# Patient Record
Sex: Female | Born: 1980 | Race: Black or African American | Hispanic: No | Marital: Married | Smoking: Former smoker
Health system: Southern US, Community
[De-identification: ages and names within clinical notes are randomized; demographics above are authoritative.]

## PROBLEM LIST (undated history)

## (undated) DIAGNOSIS — M1990 Unspecified osteoarthritis, unspecified site: Secondary | ICD-10-CM

## (undated) DIAGNOSIS — F319 Bipolar disorder, unspecified: Secondary | ICD-10-CM

## (undated) DIAGNOSIS — Z8709 Personal history of other diseases of the respiratory system: Secondary | ICD-10-CM

## (undated) DIAGNOSIS — F32A Depression, unspecified: Secondary | ICD-10-CM

## (undated) DIAGNOSIS — I10 Essential (primary) hypertension: Secondary | ICD-10-CM

## (undated) DIAGNOSIS — R0602 Shortness of breath: Secondary | ICD-10-CM

## (undated) DIAGNOSIS — R7303 Prediabetes: Secondary | ICD-10-CM

## (undated) DIAGNOSIS — E669 Obesity, unspecified: Secondary | ICD-10-CM

## (undated) DIAGNOSIS — R531 Weakness: Secondary | ICD-10-CM

## (undated) DIAGNOSIS — M2540 Effusion, unspecified joint: Secondary | ICD-10-CM

## (undated) DIAGNOSIS — F909 Attention-deficit hyperactivity disorder, unspecified type: Secondary | ICD-10-CM

## (undated) DIAGNOSIS — L309 Dermatitis, unspecified: Secondary | ICD-10-CM

## (undated) DIAGNOSIS — Z860100 Personal history of colon polyps, unspecified: Secondary | ICD-10-CM

## (undated) DIAGNOSIS — R87619 Unspecified abnormal cytological findings in specimens from cervix uteri: Secondary | ICD-10-CM

## (undated) DIAGNOSIS — F419 Anxiety disorder, unspecified: Secondary | ICD-10-CM

## (undated) DIAGNOSIS — J45909 Unspecified asthma, uncomplicated: Secondary | ICD-10-CM

## (undated) DIAGNOSIS — K219 Gastro-esophageal reflux disease without esophagitis: Secondary | ICD-10-CM

## (undated) DIAGNOSIS — F603 Borderline personality disorder: Secondary | ICD-10-CM

## (undated) DIAGNOSIS — K648 Other hemorrhoids: Secondary | ICD-10-CM

## (undated) DIAGNOSIS — R05 Cough: Secondary | ICD-10-CM

## (undated) DIAGNOSIS — F410 Panic disorder [episodic paroxysmal anxiety] without agoraphobia: Secondary | ICD-10-CM

## (undated) DIAGNOSIS — R059 Cough, unspecified: Secondary | ICD-10-CM

## (undated) DIAGNOSIS — K589 Irritable bowel syndrome without diarrhea: Secondary | ICD-10-CM

## (undated) DIAGNOSIS — M5430 Sciatica, unspecified side: Secondary | ICD-10-CM

## (undated) DIAGNOSIS — N926 Irregular menstruation, unspecified: Secondary | ICD-10-CM

## (undated) DIAGNOSIS — F329 Major depressive disorder, single episode, unspecified: Secondary | ICD-10-CM

## (undated) DIAGNOSIS — F99 Mental disorder, not otherwise specified: Secondary | ICD-10-CM

## (undated) DIAGNOSIS — B009 Herpesviral infection, unspecified: Secondary | ICD-10-CM

## (undated) DIAGNOSIS — G4700 Insomnia, unspecified: Secondary | ICD-10-CM

## (undated) DIAGNOSIS — Z8619 Personal history of other infectious and parasitic diseases: Secondary | ICD-10-CM

## (undated) DIAGNOSIS — Z309 Encounter for contraceptive management, unspecified: Secondary | ICD-10-CM

## (undated) DIAGNOSIS — T7840XA Allergy, unspecified, initial encounter: Secondary | ICD-10-CM

## (undated) DIAGNOSIS — G43909 Migraine, unspecified, not intractable, without status migrainosus: Secondary | ICD-10-CM

## (undated) DIAGNOSIS — R3915 Urgency of urination: Secondary | ICD-10-CM

## (undated) DIAGNOSIS — K5900 Constipation, unspecified: Secondary | ICD-10-CM

## (undated) DIAGNOSIS — IMO0002 Reserved for concepts with insufficient information to code with codable children: Secondary | ICD-10-CM

## (undated) DIAGNOSIS — R51 Headache: Secondary | ICD-10-CM

## (undated) DIAGNOSIS — M549 Dorsalgia, unspecified: Secondary | ICD-10-CM

## (undated) DIAGNOSIS — M62830 Muscle spasm of back: Secondary | ICD-10-CM

## (undated) DIAGNOSIS — G8929 Other chronic pain: Secondary | ICD-10-CM

## (undated) HISTORY — DX: Prediabetes: R73.03

## (undated) HISTORY — PX: COLONOSCOPY: SHX174

## (undated) HISTORY — DX: Irritable bowel syndrome, unspecified: K58.9

## (undated) HISTORY — DX: Unspecified abnormal cytological findings in specimens from cervix uteri: R87.619

## (undated) HISTORY — DX: Irregular menstruation, unspecified: N92.6

## (undated) HISTORY — DX: Obesity, unspecified: E66.9

## (undated) HISTORY — DX: Encounter for contraceptive management, unspecified: Z30.9

## (undated) HISTORY — PX: BACK SURGERY: SHX140

## (undated) HISTORY — DX: Allergy, unspecified, initial encounter: T78.40XA

## (undated) HISTORY — DX: Sciatica, unspecified side: M54.30

## (undated) HISTORY — DX: Personal history of other infectious and parasitic diseases: Z86.19

## (undated) HISTORY — DX: Headache: R51

## (undated) HISTORY — PX: CHOLECYSTECTOMY: SHX55

## (undated) HISTORY — DX: Herpesviral infection, unspecified: B00.9

## (undated) HISTORY — DX: Reserved for concepts with insufficient information to code with codable children: IMO0002

## (undated) HISTORY — DX: Panic disorder (episodic paroxysmal anxiety): F41.0

## (undated) HISTORY — PX: OTHER SURGICAL HISTORY: SHX169

## (undated) HISTORY — PX: BUNIONECTOMY: SHX129

## (undated) HISTORY — PX: WISDOM TOOTH EXTRACTION: SHX21

## (undated) HISTORY — PX: TONSILLECTOMY: SUR1361

---
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
## 2001-05-04 ENCOUNTER — Ambulatory Visit (HOSPITAL_COMMUNITY): Admission: RE | Admit: 2001-05-04 | Discharge: 2001-05-04 | Payer: Self-pay | Admitting: *Deleted

## 2001-05-04 ENCOUNTER — Encounter: Payer: Self-pay | Admitting: Family Medicine

## 2001-07-17 ENCOUNTER — Other Ambulatory Visit: Admission: RE | Admit: 2001-07-17 | Discharge: 2001-07-17 | Payer: Self-pay | Admitting: Obstetrics and Gynecology

## 2001-10-05 ENCOUNTER — Ambulatory Visit (HOSPITAL_COMMUNITY): Admission: RE | Admit: 2001-10-05 | Discharge: 2001-10-05 | Payer: Self-pay | Admitting: Gastroenterology

## 2001-10-05 ENCOUNTER — Encounter: Payer: Self-pay | Admitting: Gastroenterology

## 2001-10-12 ENCOUNTER — Ambulatory Visit (HOSPITAL_COMMUNITY): Admission: RE | Admit: 2001-10-12 | Discharge: 2001-10-12 | Payer: Self-pay | Admitting: Gastroenterology

## 2001-10-12 ENCOUNTER — Encounter: Payer: Self-pay | Admitting: Gastroenterology

## 2001-10-19 ENCOUNTER — Encounter (INDEPENDENT_AMBULATORY_CARE_PROVIDER_SITE_OTHER): Payer: Self-pay | Admitting: Specialist

## 2001-10-19 ENCOUNTER — Ambulatory Visit (HOSPITAL_COMMUNITY): Admission: RE | Admit: 2001-10-19 | Discharge: 2001-10-20 | Payer: Self-pay | Admitting: Surgery

## 2001-12-22 ENCOUNTER — Emergency Department (HOSPITAL_COMMUNITY): Admission: EM | Admit: 2001-12-22 | Discharge: 2001-12-22 | Payer: Self-pay | Admitting: Emergency Medicine

## 2002-05-21 ENCOUNTER — Emergency Department (HOSPITAL_COMMUNITY): Admission: EM | Admit: 2002-05-21 | Discharge: 2002-05-21 | Payer: Self-pay | Admitting: *Deleted

## 2005-06-28 ENCOUNTER — Emergency Department (HOSPITAL_COMMUNITY): Admission: EM | Admit: 2005-06-28 | Discharge: 2005-06-28 | Payer: Self-pay | Admitting: Emergency Medicine

## 2005-08-24 ENCOUNTER — Emergency Department (HOSPITAL_COMMUNITY): Admission: EM | Admit: 2005-08-24 | Discharge: 2005-08-24 | Payer: Self-pay | Admitting: Emergency Medicine

## 2005-09-01 ENCOUNTER — Encounter (HOSPITAL_COMMUNITY)
Admission: RE | Admit: 2005-09-01 | Discharge: 2005-10-01 | Payer: Self-pay | Admitting: Rehabilitative and Restorative Service Providers"

## 2005-09-02 ENCOUNTER — Ambulatory Visit (HOSPITAL_COMMUNITY): Admission: RE | Admit: 2005-09-02 | Discharge: 2005-09-02 | Payer: Self-pay | Admitting: Family Medicine

## 2006-06-22 ENCOUNTER — Emergency Department: Payer: Self-pay | Admitting: Emergency Medicine

## 2006-06-23 ENCOUNTER — Inpatient Hospital Stay: Payer: Self-pay | Admitting: Unknown Physician Specialty

## 2007-01-12 ENCOUNTER — Emergency Department (HOSPITAL_COMMUNITY): Admission: EM | Admit: 2007-01-12 | Discharge: 2007-01-12 | Payer: Self-pay | Admitting: Emergency Medicine

## 2007-01-21 ENCOUNTER — Emergency Department (HOSPITAL_COMMUNITY): Admission: EM | Admit: 2007-01-21 | Discharge: 2007-01-21 | Payer: Self-pay | Admitting: Emergency Medicine

## 2007-01-26 ENCOUNTER — Emergency Department (HOSPITAL_COMMUNITY): Admission: EM | Admit: 2007-01-26 | Discharge: 2007-01-27 | Payer: Self-pay | Admitting: Emergency Medicine

## 2007-05-14 ENCOUNTER — Other Ambulatory Visit: Admission: RE | Admit: 2007-05-14 | Discharge: 2007-05-14 | Payer: Self-pay | Admitting: Obstetrics and Gynecology

## 2007-05-16 ENCOUNTER — Ambulatory Visit (HOSPITAL_COMMUNITY): Admission: RE | Admit: 2007-05-16 | Discharge: 2007-05-16 | Payer: Self-pay | Admitting: Obstetrics and Gynecology

## 2008-06-24 ENCOUNTER — Other Ambulatory Visit: Admission: RE | Admit: 2008-06-24 | Discharge: 2008-06-24 | Payer: Self-pay | Admitting: Obstetrics & Gynecology

## 2008-08-20 ENCOUNTER — Emergency Department (HOSPITAL_COMMUNITY): Admission: EM | Admit: 2008-08-20 | Discharge: 2008-08-20 | Payer: Self-pay | Admitting: Emergency Medicine

## 2009-05-06 ENCOUNTER — Emergency Department: Payer: Self-pay | Admitting: Emergency Medicine

## 2009-06-25 ENCOUNTER — Other Ambulatory Visit: Admission: RE | Admit: 2009-06-25 | Discharge: 2009-06-25 | Payer: Self-pay | Admitting: Obstetrics and Gynecology

## 2010-01-14 ENCOUNTER — Emergency Department (HOSPITAL_COMMUNITY): Admission: EM | Admit: 2010-01-14 | Discharge: 2010-01-14 | Payer: Self-pay | Admitting: Emergency Medicine

## 2010-01-21 ENCOUNTER — Emergency Department (HOSPITAL_COMMUNITY): Admission: EM | Admit: 2010-01-21 | Discharge: 2010-01-21 | Payer: Self-pay | Admitting: Family Medicine

## 2010-06-29 ENCOUNTER — Other Ambulatory Visit
Admission: RE | Admit: 2010-06-29 | Discharge: 2010-06-29 | Payer: Self-pay | Source: Home / Self Care | Admitting: Obstetrics & Gynecology

## 2010-06-29 ENCOUNTER — Other Ambulatory Visit: Payer: Self-pay | Admitting: Obstetrics & Gynecology

## 2010-07-09 ENCOUNTER — Encounter (HOSPITAL_COMMUNITY): Payer: Self-pay

## 2010-07-09 ENCOUNTER — Emergency Department (HOSPITAL_COMMUNITY)
Admission: EM | Admit: 2010-07-09 | Discharge: 2010-07-09 | Disposition: A | Discharge status: Home / Self Care | Payer: Self-pay | Attending: Emergency Medicine | Admitting: Emergency Medicine

## 2010-07-09 ENCOUNTER — Emergency Department (HOSPITAL_COMMUNITY): Admit: 2010-07-09 | Discharge: 2010-07-09 | Disposition: A | Discharge status: Home / Self Care | Payer: Self-pay

## 2010-07-09 DIAGNOSIS — F3130 Bipolar disorder, current episode depressed, mild or moderate severity, unspecified: Secondary | ICD-10-CM | POA: Insufficient documentation

## 2010-07-09 DIAGNOSIS — H538 Other visual disturbances: Secondary | ICD-10-CM | POA: Insufficient documentation

## 2010-07-09 DIAGNOSIS — R112 Nausea with vomiting, unspecified: Secondary | ICD-10-CM | POA: Insufficient documentation

## 2010-07-09 DIAGNOSIS — Z79899 Other long term (current) drug therapy: Secondary | ICD-10-CM | POA: Insufficient documentation

## 2010-07-09 DIAGNOSIS — R42 Dizziness and giddiness: Secondary | ICD-10-CM | POA: Insufficient documentation

## 2010-07-09 DIAGNOSIS — R630 Anorexia: Secondary | ICD-10-CM | POA: Insufficient documentation

## 2010-07-09 DIAGNOSIS — R51 Headache: Secondary | ICD-10-CM | POA: Insufficient documentation

## 2010-08-20 LAB — BASIC METABOLIC PANEL
Calcium: 9.1 mg/dL (ref 8.4–10.5)
Chloride: 104 mEq/L (ref 96–112)
GFR calc Af Amer: >60 mL/min (ref >60)
Potassium: 3.7 mEq/L (ref 3.5–5.1)
Sodium: 136 mEq/L (ref 135–145)

## 2010-08-20 LAB — DIFFERENTIAL
Eosinophils Absolute: 0.2 10*3/uL (ref 0.0–0.7)
Eosinophils Relative: 2 % (ref 0–5)
Monocytes Relative: 6 % (ref 3–12)
Neutro Abs: 6.6 10*3/uL (ref 1.7–7.7)
Neutrophils Relative %: 66 % (ref 43–77)

## 2010-08-20 LAB — CBC
HCT: 42 % (ref 36.0–46.0)
MCH: 32.2 pg (ref 26.0–34.0)
MCV: 92.9 fL (ref 78.0–100.0)
Platelets: 308 10*3/uL (ref 150–400)

## 2010-08-20 LAB — URINALYSIS, ROUTINE W REFLEX MICROSCOPIC
Ketones, ur: NEGATIVE mg/dL
Protein, ur: NEGATIVE mg/dL
pH: 6 (ref 5.0–8.0)

## 2010-09-16 LAB — POCT CARDIAC MARKERS
Myoglobin, poc: 37.5 ng/mL (ref 12–200)
Troponin i, poc: <0.05 ng/mL (ref 0.00–0.09)

## 2010-10-09 ENCOUNTER — Emergency Department (HOSPITAL_COMMUNITY)
Admission: EM | Admit: 2010-10-09 | Discharge: 2010-10-09 | Disposition: A | Discharge status: Home / Self Care | Payer: Medicaid Other | Attending: Emergency Medicine | Admitting: Emergency Medicine

## 2010-10-09 ENCOUNTER — Emergency Department (HOSPITAL_COMMUNITY): Payer: Medicaid Other

## 2010-10-09 DIAGNOSIS — F411 Generalized anxiety disorder: Secondary | ICD-10-CM | POA: Insufficient documentation

## 2010-10-09 DIAGNOSIS — J45909 Unspecified asthma, uncomplicated: Secondary | ICD-10-CM | POA: Insufficient documentation

## 2010-10-09 DIAGNOSIS — F319 Bipolar disorder, unspecified: Secondary | ICD-10-CM | POA: Insufficient documentation

## 2010-10-09 DIAGNOSIS — G43909 Migraine, unspecified, not intractable, without status migrainosus: Secondary | ICD-10-CM | POA: Insufficient documentation

## 2010-10-09 DIAGNOSIS — F988 Other specified behavioral and emotional disorders with onset usually occurring in childhood and adolescence: Secondary | ICD-10-CM | POA: Insufficient documentation

## 2010-10-09 DIAGNOSIS — Z79899 Other long term (current) drug therapy: Secondary | ICD-10-CM | POA: Insufficient documentation

## 2010-10-09 LAB — URINALYSIS, ROUTINE W REFLEX MICROSCOPIC
Bilirubin Urine: NEGATIVE
Glucose, UA: NEGATIVE mg/dL

## 2010-10-09 LAB — BASIC METABOLIC PANEL
BUN: 6 mg/dL (ref 6–23)
CO2: 24 mEq/L (ref 19–32)
Chloride: 101 mEq/L (ref 96–112)
Creatinine, Ser: 0.84 mg/dL (ref 0.4–1.2)
GFR calc Af Amer: >60 mL/min (ref >60)
Glucose, Bld: 85 mg/dL (ref 70–99)
Potassium: 3.4 mEq/L — ABNORMAL LOW (ref 3.5–5.1)

## 2010-10-22 NOTE — Op Note (Signed)
[facility]  Patient:    Sharon Vazquez, Sharon Vazquez Visit Number: [id number] MRN: [id number]          Service Type: DSU Location: [id number] Attending Physician:  Carman Ching Md Dictated by:   Abigail Miyamoto, M.D. Proc. Date: 10/19/01 Admit Date:  10/19/2001 Discharge Date: 10/20/2001                 Operative Report  PREOPERATIVEDIAGNOSIS:  Acute cholecystitis.  POSTOPERATIVE DIAGNOSIS:  Acute cholecystitis.  OPERATION PERFORMED:  Laparoscopic cholecystectomy.  SURGEON:  Abigail Miyamoto, M.D.  ANESTHESIA:  General endotracheal anesthesia, 0.25% Marcaine.  ESTIMATED BLOOD LOSS:  Minimal.  DESCRIPTION OF PROCEDURE:  The patient was brought to the operating room and identified as Sharon Vazquez. She was placed supine on the operatingroom table and general endotracheal anesthesia was induced. Her abdomen was then prepped and draped in the usual sterile fashion. Using a #15 blade, a small transverse incision was made below the umbilicus. The incision was carried down through the fascia which was then opened with the scalpel. A hemostat was then used to pass through the peritoneal cavity. A #0Vicryl pursestring suture then placed around the fascial opening. The Hasson port was placed through the opening and insufflation was begun. An 11 mm port was then placed in the patients epigastrium and two 5 mm ports were placed in the patients right flank area under direct vision. The gallbladder was then grasped and retracted above the liver bed. Dissection was then carried out to the base of the gallbladder. The cystic duct was dissected out and clipped three times proximally and once distally and transected with scissors. The cystic artery was then identified, clipped twice proximally and once distally and transected as well. The gallbladder was then slowly dissected free from the liver bed with electrocautery. Once the gallbladder was  excised from the liver bed the bed was examined andhemostasis was achieved. The gallbladder was then removed through the incision at the umbilicus. The #0 Vicryl at the umbilicus was tied in placeclosing the fascial defect. All ports were then removed under direct vision and the abdomen was deflated. All incisions were then anesthetized with 0.25% Marcaine and then closed with 4-0 Vicryl subcuticular sutures. Steri-Strips, gauze and tape were then applied. The patient tolerated the procedure well. All sponge, needle, lap and instrument counts were correct at the end of the procedure. The patient was then extubated in the operatingroom and taken in stable condition to the recovery room. Dictated by:   Abigail Miyamoto, M.D. Attending Physician:  Carman Ching Md DD:  10/19/01 TD:  10/22/01 Job: [id number]

## 2011-03-18 LAB — COMPREHENSIVE METABOLIC PANEL
ALT: 13
AST: 16
Alkaline Phosphatase: 52
Creatinine, Ser: 0.76
GFR calc Af Amer: >60
GFR calc non Af Amer: >60
Sodium: 137

## 2011-03-18 LAB — LIPASE, BLOOD: Lipase: 20

## 2011-03-18 LAB — DIFFERENTIAL
Basophils Relative: 0
Eosinophils Absolute: 0.4
Lymphs Abs: 3.9 — ABNORMAL HIGH
Monocytes Relative: 6
Neutrophils Relative %: 57

## 2011-03-18 LAB — POCT URINALYSIS DIP (DEVICE)
Bilirubin Urine: NEGATIVE
Glucose, UA: NEGATIVE
Hgb urine dipstick: NEGATIVE
Nitrite: NEGATIVE
Operator id: 116391
Urobilinogen, UA: 0.2

## 2011-03-18 LAB — CBC
Hemoglobin: 12.7
MCV: 93.8
RBC: 3.93
RDW: 13.6
WBC: 11.8 — ABNORMAL HIGH

## 2011-03-18 LAB — URINALYSIS, ROUTINE W REFLEX MICROSCOPIC
Bilirubin Urine: NEGATIVE
Glucose, UA: NEGATIVE
Hgb urine dipstick: NEGATIVE
Specific Gravity, Urine: 1.025

## 2011-03-18 LAB — PREGNANCY, URINE: Preg Test, Ur: NEGATIVE

## 2011-03-21 LAB — URINALYSIS, ROUTINE W REFLEX MICROSCOPIC
Bilirubin Urine: NEGATIVE
Hgb urine dipstick: NEGATIVE
Ketones, ur: NEGATIVE
Nitrite: NEGATIVE
pH: 6

## 2011-03-21 LAB — PREGNANCY, URINE: Preg Test, Ur: NEGATIVE

## 2011-07-01 ENCOUNTER — Other Ambulatory Visit (HOSPITAL_COMMUNITY)
Admission: RE | Admit: 2011-07-01 | Discharge: 2011-07-01 | Disposition: A | Discharge status: Home / Self Care | Payer: Self-pay | Source: Ambulatory Visit | Attending: Obstetrics and Gynecology | Admitting: Obstetrics and Gynecology

## 2011-07-01 ENCOUNTER — Other Ambulatory Visit: Payer: Self-pay | Admitting: Adult Health

## 2011-07-01 DIAGNOSIS — Z113 Encounter for screening for infections with a predominantly sexual mode of transmission: Secondary | ICD-10-CM | POA: Insufficient documentation

## 2011-07-01 DIAGNOSIS — Z01419 Encounter for gynecological examination (general) (routine) without abnormal findings: Secondary | ICD-10-CM | POA: Insufficient documentation

## 2011-08-01 ENCOUNTER — Emergency Department (HOSPITAL_COMMUNITY)
Admission: EM | Admit: 2011-08-01 | Discharge: 2011-08-01 | Disposition: A | Discharge status: Home / Self Care | Payer: Self-pay

## 2011-08-01 ENCOUNTER — Encounter (HOSPITAL_COMMUNITY): Payer: Self-pay | Admitting: *Deleted

## 2011-08-01 HISTORY — DX: Bipolar disorder, unspecified: F31.9

## 2011-08-01 HISTORY — DX: Essential (primary) hypertension: I10

## 2011-08-01 HISTORY — DX: Borderline personality disorder: F60.3

## 2011-08-01 HISTORY — DX: Attention-deficit hyperactivity disorder, unspecified type: F90.9

## 2011-08-01 HISTORY — DX: Anxiety disorder, unspecified: F41.9

## 2011-08-01 NOTE — ED Notes (Signed)
Took Xanax at work at 1700 due to anxiety and now has dizziness and light headedness, denies N/v

## 2011-08-01 NOTE — ED Notes (Signed)
Pt slurring words and sedated while in triage, family member states that is not her usual behavior including after taking Prudy Feeler

## 2012-01-17 ENCOUNTER — Emergency Department (HOSPITAL_COMMUNITY)
Admission: EM | Admit: 2012-01-17 | Discharge: 2012-01-17 | Disposition: A | Discharge status: Home / Self Care | Payer: Self-pay | Attending: Emergency Medicine | Admitting: Emergency Medicine

## 2012-01-17 ENCOUNTER — Encounter (HOSPITAL_COMMUNITY): Payer: Self-pay

## 2012-01-17 DIAGNOSIS — F17200 Nicotine dependence, unspecified, uncomplicated: Secondary | ICD-10-CM | POA: Insufficient documentation

## 2012-01-17 DIAGNOSIS — F319 Bipolar disorder, unspecified: Secondary | ICD-10-CM | POA: Insufficient documentation

## 2012-01-17 DIAGNOSIS — R51 Headache: Secondary | ICD-10-CM | POA: Insufficient documentation

## 2012-01-17 DIAGNOSIS — Z79899 Other long term (current) drug therapy: Secondary | ICD-10-CM | POA: Insufficient documentation

## 2012-01-17 DIAGNOSIS — I10 Essential (primary) hypertension: Secondary | ICD-10-CM | POA: Insufficient documentation

## 2012-01-17 DIAGNOSIS — Z9089 Acquired absence of other organs: Secondary | ICD-10-CM | POA: Insufficient documentation

## 2012-01-17 DIAGNOSIS — F909 Attention-deficit hyperactivity disorder, unspecified type: Secondary | ICD-10-CM | POA: Insufficient documentation

## 2012-01-17 DIAGNOSIS — R45850 Homicidal ideations: Secondary | ICD-10-CM | POA: Insufficient documentation

## 2012-01-17 LAB — RAPID URINE DRUG SCREEN, HOSP PERFORMED
Barbiturates: NOT DETECTED
Cocaine: NOT DETECTED
Opiates: NOT DETECTED

## 2012-01-17 LAB — CBC WITH DIFFERENTIAL/PLATELET
Eosinophils Relative: 3 % (ref 0–5)
HCT: 39.3 % (ref 36.0–46.0)
Hemoglobin: 14.3 g/dL (ref 12.0–15.0)
Lymphocytes Relative: 33 % (ref 12–46)
MCV: 87.9 fL (ref 78.0–100.0)
Monocytes Absolute: 0.5 10*3/uL (ref 0.1–1.0)
Monocytes Relative: 4 % (ref 3–12)
Neutro Abs: 6.7 10*3/uL (ref 1.7–7.7)
WBC: 11.3 10*3/uL — ABNORMAL HIGH (ref 4.0–10.5)

## 2012-01-17 LAB — COMPREHENSIVE METABOLIC PANEL
BUN: 9 mg/dL (ref 6–23)
CO2: 25 mEq/L (ref 19–32)
Calcium: 9.7 mg/dL (ref 8.4–10.5)
Chloride: 102 mEq/L (ref 96–112)
Creatinine, Ser: 1 mg/dL (ref 0.50–1.10)
GFR calc Af Amer: 86 mL/min — ABNORMAL LOW (ref >90)
GFR calc non Af Amer: 74 mL/min — ABNORMAL LOW (ref >90)
Glucose, Bld: 79 mg/dL (ref 70–99)
Total Bilirubin: 0.3 mg/dL (ref 0.3–1.2)

## 2012-01-17 MED ORDER — PROMETHAZINE HCL 25 MG/ML IJ SOLN
25.0000 mg | Freq: Once | INTRAMUSCULAR | Status: AC
  Administered 2012-01-17: 25 mg via INTRAMUSCULAR
  Filled 2012-01-17: qty 1

## 2012-01-17 MED ORDER — KETOROLAC TROMETHAMINE 60 MG/2ML IM SOLN
60.0000 mg | Freq: Once | INTRAMUSCULAR | Status: AC
  Administered 2012-01-17: 60 mg via INTRAMUSCULAR
  Filled 2012-01-17: qty 2

## 2012-01-17 NOTE — BH Assessment (Signed)
Assessment Note   Sharon Vazquez is an 31 y.o. female. PT REPORTED TO THE ED COMPLAINING OF MIGRAINE HEADACHES AS RELATED TO NOT HAVING HER BIPOLAR MEDS, AND MOOD SWINGS. PT REPORTED SHE HURT SO BAD THAT SHE FELT LIKE SHE COULD KILL SOMEONE SUCH AS CO-WORKERS. PT REPORTS SHE IS NOT SUICIDAL AND WAS TRYING TO EXPRESS HOW HER PAIN WAS AND HOW MUCH SHE NEEDED HER ABILIFY.  PT REPORTS SHE HAD TRIED UNSUCCESSFUL TO REACH SOMEONE AT HER [facility] IN [place].  SHE IS NOT SUICIDAL, HOMICIDAL, NOR PSYCHOTIC.  PT CONTRACTS FOR SAFETY TO SELF AND OTHERS.      Axis I: Bipolar, Depressed Axis II: Deferred Axis III:  Past Medical History  Diagnosis Date  . Hypertension   . Anxiety   . Bipolar 1 disorder   . ADHD (attention deficit hyperactivity disorder)   . Borderline personality disorder    Axis IV: problems with access to health care services Axis V: 51-60 moderate symptoms           Past Medical History:  Past Medical History  Diagnosis Date  . Hypertension   . Anxiety   . Bipolar 1 disorder   . ADHD (attention deficit hyperactivity disorder)   . Borderline personality disorder     Past Surgical History  Procedure Date  . Cholecystectomy   . Wisdom tooth extraction   . Bunion removal     Family History: No family history on file.  Social History:  reports that she has been smoking Cigarettes.  She has been smoking about .5 packs per day. She does not have any smokeless tobacco history on file. She reports that she drinks alcohol. She reports that she does not use illicit drugs.  Additional Social History:  Alcohol / Drug Use Pain Medications: na Prescriptions: na Over the Counter: na History of alcohol / drug use?: Yes Substance #1 Name of Substance 1: BEER 1 - Age of First Use: 17 1 - Amount (size/oz): 2-3 BEERS 1 - Frequency: WEEKLY 1 - Duration: 3 YEARS 1 - Last Use / Amount: 81213   2 BEERS  12OZ  CIWA: CIWA-Ar BP: 130/84 mmHg Pulse Rate: 102    COWS:    Allergies:  Allergies  Allergen Reactions  . Bee Venom Anaphylaxis  . Dilaudid (Hydromorphone Hcl) Anaphylaxis and Hives  . Peanut-Containing Drug Products Anaphylaxis  . Senna Anaphylaxis and Hives  . Adhesive (Tape) Rash    Home Medications:  (Not in a hospital admission)  OB/GYN Status:  No LMP recorded. Patient has had an injection.  General Assessment Data Location of Assessment: [facility] ACT Assessment: Yes Living Arrangements: Spouse/significant other Can pt return to current living arrangement?: Yes Admission Status: Voluntary Is patient capable of signing voluntary admission?: Yes Transfer from: Acute Hospital Referral Source: MD (dr Georges Mouse)  Education Status Contact person: GEOFFREY KNIGHT-SPOUSE-[phone]  Risk to self Suicidal Ideation: No Suicidal Intent: No Is patient at risk for suicide?: No Suicidal Plan?: No Access to Means: No What has been your use of drugs/alcohol within the last 12 months?: BEER Previous Attempts/Gestures: Yes How many times?: 1  Other Self Harm Risks: NO Triggers for Past Attempts: Other (Comment) (DEATH OF GRANDMOTHER) Intentional Self Injurious Behavior: None Family Suicide History: No Recent stressful life event(s): Other (Comment) (OUT OF PSYCH MEDS) Persecutory voices/beliefs?: No Depression: Yes Depression Symptoms: Despondent;Tearfulness;Loss of interest in usual pleasures;Feeling worthless/self pity;Feeling angry/irritable;Isolating Substance abuse history and/or treatment for substance abuse?: No Suicide prevention information given to non-admitted patients:  Not applicable  Risk to Others Homicidal Ideation: No Thoughts of Harm to Others: No Current Homicidal Intent: No-Not Currently/Within Last 6 Months Current Homicidal Plan: No Access to Homicidal Means: No History of harm to others?: No Assessment of Violence: None Noted Violent Behavior Description: NONE Does patient have access to weapons?:  No Criminal Charges Pending?: No Does patient have a court date: No  Psychosis Hallucinations: None noted Delusions: None noted  Mental Status Report Appear/Hygiene: Improved Eye Contact: Good Motor Activity: Freedom of movement Speech: Logical/coherent Level of Consciousness: Alert;Crying Mood: Depressed Affect: Appropriate to circumstance Anxiety Level: Minimal Thought Processes: Coherent;Relevant Judgement: Unimpaired Orientation: Person;Place;Time;Situation Obsessive Compulsive Thoughts/Behaviors: None  Cognitive Functioning Concentration: Normal Memory: Recent Intact;Remote Intact IQ: Average Insight: Fair Impulse Control: Good Appetite: Good Sleep: No Change Total Hours of Sleep: 8  Vegetative Symptoms: None  ADLScreening [facility] Assessment Services) Patient's cognitive ability adequate to safely complete daily activities?: Yes Patient able to express need for assistance with ADLs?: Yes Independently performs ADLs?: Yes (appropriate for developmental age)  Abuse/Neglect [facility]) Physical Abuse: Denies Verbal Abuse: Denies Sexual Abuse: Denies  Prior Inpatient Therapy Prior Inpatient Therapy: Yes Prior Therapy Dates: 2010 Prior Therapy Facilty/Provider(s): [facility] Reason for Treatment: GRIEF LOSS OF GRANDMOTHER, DEPRESSED  Prior Outpatient Therapy Prior Outpatient Therapy: Yes Prior Therapy Dates: PAST 3 YEARS Prior Therapy Facilty/Provider(s): [facility] [place] Reason for Treatment: BIPOLAR  ADL Screening (condition at time of admission) Patient's cognitive ability adequate to safely complete daily activities?: Yes Patient able to express need for assistance with ADLs?: Yes Independently performs ADLs?: Yes (appropriate for developmental age) Weakness of Legs: None Weakness of Arms/Hands: None  Home Assistive Devices/Equipment Home Assistive Devices/Equipment: None  Therapy Consults (therapy consults require a physician order) PT  Evaluation Needed: No OT Evalulation Needed: No SLP Evaluation Needed: No Abuse/Neglect Assessment (Assessment to be complete while patient is alone) Physical Abuse: Denies Verbal Abuse: Denies Sexual Abuse: Denies Exploitation of patient/patient's resources: Denies Self-Neglect: Denies Values / Beliefs Cultural Requests During Hospitalization: None Spiritual Requests During Hospitalization: None Consults Spiritual Care Consult Needed: No Social Work Consult Needed: No Merchant navy officer (For Healthcare) Advance Directive: Patient does not have advance directive;Patient has advance directive, copy not in chart Pre-existing out of facility DNR order (yellow form or pink MOST form): No Nutrition Screen Diet: Regular (pt has had dinner)  Additional Information 1:1 In Past 12 Months?: No CIRT Risk: No Elopement Risk: No Does patient have medical clearance?: Yes     Disposition: CURRENT PROVIDER-[facility] Disposition Disposition of Patient: Other dispositions Other disposition(s): To current provider ([facility])  On Site Evaluation by:   Reviewed with Physician: DR Alphia Kava, Samson Frederic Winford 01/17/2012 7:30 PM

## 2012-01-17 NOTE — ED Provider Notes (Signed)
History   This chart was scribed for Geoffery Lyons, MD by Charolett Bumpers . The patient was seen in room [facility]. Patient's care was started at 1400.    CSN: [id number]  Arrival date & time 01/17/12  1306   First MD Initiated Contact with Patient 01/17/12 1400      Chief Complaint  Patient presents with  . V70.1  . Headache    (Consider location/radiation/quality/duration/timing/severity/associated sxs/prior treatment) HPI Comments: Pt reports she has had a migraines intermittently over the past couple of weeks. Pt states that the current headache started around 4 am with associated nausea. Pt reports a h/o bipolar disorder and states that she has been off of Abilify and Doxepin for over the past week due to financial issues and states that she has been having mood swings and HI. Pt denies any SI. Pt reports occasional alcohol use. Pt reports a prior hospitalization for bipolar 2-3 years ago. Pt reports she took 4 ibuprofen, 2 mg xanax and Fioricet without relief.   Patient is a 31 y.o. female presenting with headaches. The history is provided by the patient.  Headache  This is a new problem. The current episode started 12 to 24 hours ago. The problem occurs constantly. The problem has not changed since onset.The pain is moderate. The pain does not radiate. Associated symptoms include nausea. Pertinent negatives include no fever and no vomiting. She has tried NSAIDs (Fioricet ) for the symptoms. The treatment provided no relief.    Past Medical History  Diagnosis Date  . Hypertension   . Anxiety   . Bipolar 1 disorder   . ADHD (attention deficit hyperactivity disorder)   . Borderline personality disorder     Past Surgical History  Procedure Date  . Cholecystectomy   . Wisdom tooth extraction   . Bunion removal     No family history on file.  History  Substance Use Topics  . Smoking status: Current Everyday Smoker -- 0.5 packs/day    Types: Cigarettes  .  Smokeless tobacco: Not on file  . Alcohol Use: Yes     " a beer every couple of days"    OB History    Grav Para Term Preterm Abortions TAB SAB Ect Mult Living                  Review of Systems  Constitutional: Negative for fever.  Gastrointestinal: Positive for nausea. Negative for vomiting.  Neurological: Positive for headaches.  Psychiatric/Behavioral: Negative for suicidal ideas.       Homicidal ideations.  All other systems reviewed and are negative.    Allergies  Bee venom; Dilaudid; Other; and Senna  Home Medications   Current Outpatient Rx  Name Route Sig Dispense Refill  . ALPRAZOLAM 2 MG PO TABS Oral Take 2 mg by mouth daily as needed. FOR ANXIETY    . ARIPIPRAZOLE 5 MG PO TABS Oral Take 5 mg by mouth daily.    Marland Kitchen CLONIDINE HCL 0.1 MG PO TABS Oral Take 0.1 mg by mouth 2 (two) times daily.    Marland Kitchen DOXEPIN HCL 25 MG PO CAPS Oral Take 25 mg by mouth 2 (two) times daily.    Marland Kitchen LURASIDONE HCL 80 MG PO TABS Oral Take 80 mg by mouth at bedtime.    Marland Kitchen MEDROXYPROGESTERONE ACETATE 150 MG/ML IM SUSP Intramuscular Inject 150 mg into the muscle every 3 (three) months.      BP 130/84  Pulse 102  Temp 98.6 F (37 C) (  Oral)  Resp 16  Ht 5\' 6"  (1.676 m)  Wt 230 lb (104.327 kg)  BMI 37.12 kg/m2  SpO2 99%  Physical Exam  Nursing note and vitals reviewed. Constitutional: She is oriented to person, place, and time. She appears well-developed and well-nourished. No distress.  HENT:  Head: Normocephalic and atraumatic.  Eyes: EOM are normal. Pupils are equal, round, and reactive to light.       No papilledema  Neck: Normal range of motion. Neck supple. No tracheal deviation present.  Cardiovascular: Normal rate, regular rhythm and normal heart sounds.   Pulmonary/Chest: Effort normal and breath sounds normal. No respiratory distress. She has no wheezes.  Abdominal: Soft. Bowel sounds are normal. She exhibits no distension. There is no tenderness.  Musculoskeletal: Normal range  of motion. She exhibits no edema.  Neurological: She is alert and oriented to person, place, and time. No sensory deficit.  Skin: Skin is warm and dry.  Psychiatric: She has a normal mood and affect. Her behavior is normal.    ED Course  Procedures (including critical care time)  DIAGNOSTIC STUDIES: Oxygen Saturation is 99% on room air, normal by my interpretation.    COORDINATION OF CARE:  14:14-Discussed planned course of treatment with the patient, who is agreeable at this time.   14:30-Medication Orders: Promethazine (Phenergan) injection 25 mg-once; Ketorolac (Toradol) injection 60 mg-once.   Labs Reviewed - No data to display No results found.   No diagnosis found.    MDM  Patient seen by act, felt not to be suicidal or homicidal.  She is to follow up tomorrow with counselor (see Act note), return prn   I personally performed the services described in this documentation, which was scribed in my presence. The recorded information has been reviewed and considered.         Geoffery Lyons, MD 01/17/12 1850

## 2012-01-17 NOTE — ED Notes (Signed)
Pt c/o headache since 4 am with nausea.  Reports history of tension headaches.  Pt took fioricet without relief.  Also took 4 ibprofen and 2 mg xanax this morning.

## 2012-01-17 NOTE — ED Notes (Signed)
Pt reports has been off of her abilify and doxepin for over a week.  Says has been having mood swings and having HI.  Denies SI.

## 2012-07-03 ENCOUNTER — Other Ambulatory Visit (HOSPITAL_COMMUNITY)
Admission: RE | Admit: 2012-07-03 | Discharge: 2012-07-03 | Disposition: A | Discharge status: Home / Self Care | Payer: Medicaid Other | Source: Ambulatory Visit | Attending: Obstetrics and Gynecology | Admitting: Obstetrics and Gynecology

## 2012-07-03 ENCOUNTER — Other Ambulatory Visit: Payer: Self-pay | Admitting: Adult Health

## 2012-07-03 DIAGNOSIS — Z1151 Encounter for screening for human papillomavirus (HPV): Secondary | ICD-10-CM | POA: Insufficient documentation

## 2012-07-03 DIAGNOSIS — Z01419 Encounter for gynecological examination (general) (routine) without abnormal findings: Secondary | ICD-10-CM | POA: Insufficient documentation

## 2012-07-03 DIAGNOSIS — Z113 Encounter for screening for infections with a predominantly sexual mode of transmission: Secondary | ICD-10-CM | POA: Insufficient documentation

## 2012-07-09 ENCOUNTER — Encounter (HOSPITAL_COMMUNITY): Payer: Self-pay | Admitting: *Deleted

## 2012-07-09 ENCOUNTER — Emergency Department (HOSPITAL_COMMUNITY)
Admission: EM | Admit: 2012-07-09 | Discharge: 2012-07-09 | Disposition: A | Discharge status: Home / Self Care | Payer: Self-pay | Attending: Emergency Medicine | Admitting: Emergency Medicine

## 2012-07-09 ENCOUNTER — Emergency Department (HOSPITAL_COMMUNITY): Payer: Self-pay

## 2012-07-09 DIAGNOSIS — I10 Essential (primary) hypertension: Secondary | ICD-10-CM | POA: Insufficient documentation

## 2012-07-09 DIAGNOSIS — F319 Bipolar disorder, unspecified: Secondary | ICD-10-CM | POA: Insufficient documentation

## 2012-07-09 DIAGNOSIS — R1084 Generalized abdominal pain: Secondary | ICD-10-CM | POA: Insufficient documentation

## 2012-07-09 DIAGNOSIS — K5900 Constipation, unspecified: Secondary | ICD-10-CM | POA: Insufficient documentation

## 2012-07-09 DIAGNOSIS — F411 Generalized anxiety disorder: Secondary | ICD-10-CM | POA: Insufficient documentation

## 2012-07-09 DIAGNOSIS — Z8659 Personal history of other mental and behavioral disorders: Secondary | ICD-10-CM | POA: Insufficient documentation

## 2012-07-09 DIAGNOSIS — F17200 Nicotine dependence, unspecified, uncomplicated: Secondary | ICD-10-CM | POA: Insufficient documentation

## 2012-07-09 DIAGNOSIS — Z79899 Other long term (current) drug therapy: Secondary | ICD-10-CM | POA: Insufficient documentation

## 2012-07-09 DIAGNOSIS — K625 Hemorrhage of anus and rectum: Secondary | ICD-10-CM | POA: Insufficient documentation

## 2012-07-09 HISTORY — DX: Panic disorder (episodic paroxysmal anxiety): F41.0

## 2012-07-09 LAB — BASIC METABOLIC PANEL
CO2: 24 mEq/L (ref 19–32)
Calcium: 8.9 mg/dL (ref 8.4–10.5)
Glucose, Bld: 98 mg/dL (ref 70–99)
Potassium: 3.6 mEq/L (ref 3.5–5.1)
Sodium: 139 mEq/L (ref 135–145)

## 2012-07-09 LAB — CBC WITH DIFFERENTIAL/PLATELET
Eosinophils Relative: 3 % (ref 0–5)
HCT: 39.2 % (ref 36.0–46.0)
Hemoglobin: 14.4 g/dL (ref 12.0–15.0)
Lymphocytes Relative: 34 % (ref 12–46)
Lymphs Abs: 3.9 10*3/uL (ref 0.7–4.0)
MCV: 87.5 fL (ref 78.0–100.0)
Monocytes Relative: 5 % (ref 3–12)
Platelets: 361 10*3/uL (ref 150–400)
RBC: 4.48 MIL/uL (ref 3.87–5.11)
WBC: 11.7 10*3/uL — ABNORMAL HIGH (ref 4.0–10.5)

## 2012-07-09 MED ORDER — HYDROCORTISONE ACETATE 25 MG RE SUPP: 25.0000 mg | Freq: Three times a day (TID) | RECTAL | Status: DC

## 2012-07-09 MED ORDER — HYDROCORTISONE ACETATE 25 MG RE SUPP: RECTAL | Status: DC

## 2012-07-09 MED ORDER — PROMETHAZINE HCL 25 MG/ML IJ SOLN
25.0000 mg | Freq: Once | INTRAMUSCULAR | Status: AC
  Administered 2012-07-09: 25 mg via INTRAMUSCULAR
  Filled 2012-07-09: qty 1

## 2012-07-09 NOTE — ED Provider Notes (Signed)
History   This chart was scribed for Ward Givens, MD by Charolett Bumpers, ED Scribe. The patient was seen in room [facility]11[facility]11. Patient's care was started at 1712.   CSN: [id number]  Arrival date & time 07/09/12  1621   First MD Initiated Contact with Patient 07/09/12 1712      Chief Complaint  Patient presents with  . Rectal Bleeding    The history is provided by the patient. No language interpreter was used.  Sharon Vazquez is a 32 y.o. female who presents to the Emergency Department complaining of sudden onset of abdominal cramping for the past 2 hours that started after eating japanese food. She state the cramping was diffuse but has now localized to her lower abdomen. She felt like she needed to have a BM and couldn't. She states the abdominal cramps worsened and she went to the bathroom when she noticed some rectal bleeding after having a bowel movement. She noted a small amount of blood on the tissue paper and states she saw small clots. She states the BM was soft and green. She reports her last BM before today was  3-4 days ago. She reports a h/o constipation and states that she has a family h/o abdominal problems. She has had blood before when she wipes a lot but not as much as today.   She reports no relief with constipation medications including colace, miralax, metamucil   Cholecystectomy 6-7 years ago.   Followed by [facility]  Past Medical History  Diagnosis Date  . Hypertension   . Anxiety   . Bipolar 1 disorder   . ADHD (attention deficit hyperactivity disorder)   . Borderline personality disorder   . Panic attack     Past Surgical History  Procedure Date  . Cholecystectomy   . Wisdom tooth extraction   . Bunion removal     History reviewed. No pertinent family history. Uncle with h/o crohn's disease Aunt with cholecystectomy  History  Substance Use Topics  . Smoking status: Current Every Day Smoker -- 0.5 packs/day    Types:  Cigarettes  . Smokeless tobacco: Not on file  . Alcohol Use: Yes     Comment: " a beer every couple of days"  Smokes 1/2 pack daily Admits to drinking 1-2 times weekly She is currently umemployed   Review of Systems  Gastrointestinal: Positive for abdominal pain and anal bleeding. Negative for nausea, vomiting and diarrhea.  All other systems reviewed and are negative.    Allergies  Bee venom; Dilaudid; Peanut-containing drug products; Senna; and Adhesive  Home Medications   Current Outpatient Rx  Name  Route  Sig  Dispense  Refill  . ALPRAZOLAM 2 MG PO TABS   Oral   Take 2 mg by mouth 3 (three) times daily as needed. FOR ANXIETY         . ARIPIPRAZOLE 5 MG PO TABS   Oral   Take 2.5 mg by mouth daily.         Marland Kitchen CLONIDINE HCL 0.1 MG PO TABS   Oral   Take 0.1 mg by mouth 2 (two) times daily.         Marland Kitchen DOXEPIN HCL 25 MG PO CAPS   Oral   Take 25 mg by mouth 2 (two) times daily.         Marland Kitchen LURASIDONE HCL 80 MG PO TABS   Oral   Take 80 mg by mouth at bedtime.         Marland Kitchen  MEDROXYPROGESTERONE ACETATE 150 MG/ML IM SUSP   Intramuscular   Inject 150 mg into the muscle every 3 (three) months.         . IBUPROFEN 200 MG PO TABS   Oral   Take 800 mg by mouth every 6 (six) hours as needed. Pain           Triage Vitals: BP 139/92  Pulse 128  Temp 98.1 F (36.7 C) (Oral)  Resp 24  Ht 5\' 6"  (1.676 m)  Wt 235 lb (106.595 kg)  BMI 37.93 kg/m2  SpO2 98%  Laboratory interpretation all normal except except tachycardia   Physical Exam  Nursing note and vitals reviewed. Constitutional: She is oriented to person, place, and time. She appears well-developed and well-nourished. No distress.  HENT:  Head: Normocephalic and atraumatic.  Right Ear: External ear normal.  Left Ear: External ear normal.  Nose: Nose normal.  Mouth/Throat: Oropharynx is clear and moist. No oropharyngeal exudate.  Eyes: Conjunctivae normal and EOM are normal. Pupils are equal, round,  and reactive to light.  Neck: Normal range of motion. Neck supple. No tracheal deviation present.  Cardiovascular: Normal rate and regular rhythm.  Exam reveals no gallop and no friction rub.   No murmur heard. Pulmonary/Chest: Effort normal and breath sounds normal. No respiratory distress. She has no wheezes. She has no rhonchi. She has no rales.  Abdominal: Soft. She exhibits no distension. Bowel sounds are increased. There is no tenderness. There is no rebound and no guarding.  Genitourinary:       No stool in the vault or hemorrhoids noted. Bright red blood noted on the glove.   Musculoskeletal: Normal range of motion. She exhibits no edema and no tenderness.  Neurological: She is alert and oriented to person, place, and time.  Skin: Skin is warm and dry.  Psychiatric: She has a normal mood and affect. Her behavior is normal.    ED Course  Procedures (including critical care time)   Medications  promethazine (PHENERGAN) injection 25 mg (25 mg Intramuscular Given 07/09/12 1815)    DIAGNOSTIC STUDIES: Oxygen Saturation is 98% on room air, normal by my interpretation.    COORDINATION OF CARE:  17:50-Preformed rectal exam with chaperon present. Discussed planned course of treatment with the patient including blood work and occult blood, who is agreeable at this time.   19:18-Recheck: Pt states that her abdominal pain has improved. Informed her of lab results. Will order an abdominal x-ray.   20:30-Recheck: Informed pt of x-ray results. Will start pt on suppositories for possible internal hemorrhoids and Miralax for the constipation. Will have pt f/u with GI. She is agreeable with plan.   Results for orders placed during the hospital encounter of 07/09/12  CBC WITH DIFFERENTIAL      Component Value Range   WBC 11.7 (*) 4.0 - 10.5 K/uL   RBC 4.48  3.87 - 5.11 MIL/uL   Hemoglobin 14.4  12.0 - 15.0 g/dL   HCT 16.1  09.6 - 04.5 %   MCV 87.5  78.0 - 100.0 fL   MCH 32.1  26.0 - 34.0  pg   MCHC 36.7 (*) 30.0 - 36.0 g/dL   RDW 40.9  81.1 - 91.4 %   Platelets 361  150 - 400 K/uL   Neutrophils Relative 58  43 - 77 %   Neutro Abs 6.8  1.7 - 7.7 K/uL   Lymphocytes Relative 34  12 - 46 %   Lymphs Abs 3.9  0.7 -  4.0 K/uL   Monocytes Relative 5  3 - 12 %   Monocytes Absolute 0.6  0.1 - 1.0 K/uL   Eosinophils Relative 3  0 - 5 %   Eosinophils Absolute 0.4  0.0 - 0.7 K/uL   Basophils Relative 0  0 - 1 %   Basophils Absolute 0.0  0.0 - 0.1 K/uL  BASIC METABOLIC PANEL      Component Value Range   Sodium 139  135 - 145 mEq/L   Potassium 3.6  3.5 - 5.1 mEq/L   Chloride 105  96 - 112 mEq/L   CO2 24  19 - 32 mEq/L   Glucose, Bld 98  70 - 99 mg/dL   BUN 8  6 - 23 mg/dL   Creatinine, Ser 4.78  0.50 - 1.10 mg/dL   Calcium 8.9  8.4 - 29.5 mg/dL   GFR calc non Af Amer 67 (*) >90 mL/min   GFR calc Af Amer 78 (*) >90 mL/min  OCCULT BLOOD, POC DEVICE      Component Value Range   Fecal Occult Bld POSITIVE (*) NEGATIVE   Laboratory interpretation all normal except positive Hemoccult, leukocytosis .   Dg Abd Acute W/chest  07/09/2012  *RADIOLOGY REPORT*  Clinical Data: RECTAL BLEEDING abdominal pain, constipation  ACUTE ABDOMEN SERIES (ABDOMEN 2 VIEW & CHEST 1 VIEW)  Comparison: 08/20/2008  Findings: Cardiac and mediastinal contours appear normal.  The lungs appear clear.  No pleural effusion is identified.  No retroperitoneal gas noted beneath the hemidiaphragms.  Clips are noted in the right upper quadrant.  Gas and stool noted in the colon.  No dilated bowel or significant abnormal air-fluid levels are observed.  IMPRESSION:  1.  Unremarkable bowel gas pattern. 2.  Right upper quadrant clips, likely from prior cholecystectomy.   Original Report Authenticated By: Gaylyn Rong, M.D.      1. Constipation   2. Rectal bleeding     New Prescriptions   HYDROCORTISONE (ANUSOL-HC) 25 MG SUPPOSITORY    Unwrap and place 1 pr TID   Plan discharge  Devoria Albe, MD, FACEP   MDM    I personally performed the services described in this documentation, which was scribed in my presence. The recorded information has been reviewed and considered.  Devoria Albe, MD, Armando Gang       Ward Givens, MD 07/09/12 [phone]

## 2012-07-09 NOTE — ED Notes (Signed)
abd pain and rectal bleeding. Brought tissue with her with small amt of blood on it.  Nausea earlier, none now.  Very anxious.

## 2012-07-25 ENCOUNTER — Ambulatory Visit (INDEPENDENT_AMBULATORY_CARE_PROVIDER_SITE_OTHER): Payer: Medicaid Other | Admitting: Internal Medicine

## 2012-08-07 ENCOUNTER — Encounter (INDEPENDENT_AMBULATORY_CARE_PROVIDER_SITE_OTHER): Payer: Self-pay | Admitting: Internal Medicine

## 2012-08-07 ENCOUNTER — Telehealth (INDEPENDENT_AMBULATORY_CARE_PROVIDER_SITE_OTHER): Payer: Self-pay | Admitting: *Deleted

## 2012-08-07 ENCOUNTER — Encounter (HOSPITAL_COMMUNITY): Payer: Self-pay | Admitting: Pharmacy Technician

## 2012-08-07 ENCOUNTER — Other Ambulatory Visit (INDEPENDENT_AMBULATORY_CARE_PROVIDER_SITE_OTHER): Payer: Self-pay | Admitting: *Deleted

## 2012-08-07 ENCOUNTER — Ambulatory Visit (INDEPENDENT_AMBULATORY_CARE_PROVIDER_SITE_OTHER): Payer: BC Managed Care – PPO | Admitting: Internal Medicine

## 2012-08-07 DIAGNOSIS — I10 Essential (primary) hypertension: Secondary | ICD-10-CM | POA: Insufficient documentation

## 2012-08-07 DIAGNOSIS — F319 Bipolar disorder, unspecified: Secondary | ICD-10-CM

## 2012-08-07 DIAGNOSIS — K5900 Constipation, unspecified: Secondary | ICD-10-CM | POA: Insufficient documentation

## 2012-08-07 DIAGNOSIS — K625 Hemorrhage of anus and rectum: Secondary | ICD-10-CM

## 2012-08-07 DIAGNOSIS — F909 Attention-deficit hyperactivity disorder, unspecified type: Secondary | ICD-10-CM

## 2012-08-07 DIAGNOSIS — K5903 Drug induced constipation: Secondary | ICD-10-CM | POA: Insufficient documentation

## 2012-08-07 DIAGNOSIS — F410 Panic disorder [episodic paroxysmal anxiety] without agoraphobia: Secondary | ICD-10-CM

## 2012-08-07 DIAGNOSIS — R1031 Right lower quadrant pain: Secondary | ICD-10-CM

## 2012-08-07 DIAGNOSIS — G8929 Other chronic pain: Secondary | ICD-10-CM

## 2012-08-07 DIAGNOSIS — R1011 Right upper quadrant pain: Secondary | ICD-10-CM

## 2012-08-07 MED ORDER — HYOSCYAMINE SULFATE 0.125 MG SL SUBL: 0.1250 mg | SUBLINGUAL_TABLET | SUBLINGUAL | Status: DC | PRN

## 2012-08-07 MED ORDER — PEG-KCL-NACL-NASULF-NA ASC-C 100 G PO SOLR: 1.0000 | Freq: Once | ORAL | Status: DC

## 2012-08-07 NOTE — Patient Instructions (Signed)
Colonoscopy with Dr. Rehman. The risks and benefits such as perforation, bleeding, and infection were reviewed with the patient and is agreeable. 

## 2012-08-07 NOTE — Telephone Encounter (Signed)
Patient needs movi prep 

## 2012-08-07 NOTE — Progress Notes (Signed)
Subjective:     Patient ID: Sharon Vazquez, female   DOB: 11-21-1980, 32 y.o.   MRN: [id number] All medications were reviewed with the patient. HPI Referred to  The 2nd week in February, she ate and she began to have stomach cramps. She said it felt liking someone was stabbing her in her abdomen. She felt she needed to have a BM, and went to the bathroom. All she past was bright rectal bleeding. She saw blood on the tissue and the stool. She noticed clots. Seen in the ED, and did not have hemorrhoids.   Fecal occult blood positive in the ED.  She tells me her last BM before going to the ED was 4-5 days. She has a hx of constipation since having her GB removed. She tells me she fluctuates between constipation and diarrhea. She tells me she uses Mag Citrate to have BM on occasion. She tells me she has an uncle with Crohn's disease. She has rectal bleeding every other day.  She tries not to strain. Appetite is okay. She tells me she eats once a day. She has lower abdominal pain, bilaterally. She has rectal pain with and without BMs. Usually has a BM about once every 3 days. Rectal bleeding x 6 months. CBC    Component Value Date/Time   WBC 11.7* 07/09/2012 1831   RBC 4.48 07/09/2012 1831   HGB 14.4 07/09/2012 1831   HCT 39.2 07/09/2012 1831   PLT 361 07/09/2012 1831   MCV 87.5 07/09/2012 1831   MCH 32.1 07/09/2012 1831   MCHC 36.7* 07/09/2012 1831   RDW 13.6 07/09/2012 1831   LYMPHSABS 3.9 07/09/2012 1831   MONOABS 0.6 07/09/2012 1831   EOSABS 0.4 07/09/2012 1831   BASOSABS 0.0 07/09/2012 1831   07/09/2012 Acute abdomen: Gas and stool noted in the colon. No dilated bowel or significant  abnormal air-fluid levels are observed.  IMPRESSION:  1. Unremarkable bowel gas pattern.  2. Right upper quadrant clips, likely from prior cholecystectomy.   Review of Systems see hpi Current Outpatient Prescriptions  Medication Sig Dispense Refill  . alprazolam (XANAX) 2 MG tablet Take 2 mg by mouth 3 (three) times  daily as needed. FOR ANXIETY      . ARIPiprazole (ABILIFY) 5 MG tablet Take 2.5 mg by mouth daily.      Marland Kitchen aspirin-acetaminophen-caffeine (EXCEDRIN MIGRAINE) 250-250-65 MG per tablet Take 1 tablet by mouth every 6 (six) hours as needed for pain.      . cloNIDine (CATAPRES) 0.1 MG tablet Take 0.1 mg by mouth 2 (two) times daily.      Marland Kitchen doxepin (SINEQUAN) 25 MG capsule Take 25 mg by mouth 2 (two) times daily.      Marland Kitchen lurasidone (LATUDA) 80 MG TABS Take 80 mg by mouth at bedtime.      . medroxyPROGESTERone (DEPO-PROVERA) 150 MG/ML injection Inject 150 mg into the muscle every 3 (three) months.      . niacin (NIASPAN) 1000 MG CR tablet Take 1,000 mg by mouth at bedtime.       No current facility-administered medications for this visit.   Past Medical History  Diagnosis Date  . Hypertension   . Anxiety   . Bipolar 1 disorder   . ADHD (attention deficit hyperactivity disorder)   . Borderline personality disorder   . Panic attack    Past Surgical History  Procedure Laterality Date  . Wisdom tooth extraction    . Bunion removal    . Cholecystectomy  10 yrs ago   Allergies  Allergen Reactions  . Bee Venom Anaphylaxis  . Dilaudid (Hydromorphone Hcl) Anaphylaxis and Hives  . Peanut-Containing Drug Products Anaphylaxis  . Senna Anaphylaxis and Hives  . Adhesive (Tape) Rash        Objective:   Physical Exam  Filed Vitals:   08/07/12 1016  BP: 108/70  Pulse: 96  Temp: 98.9 F (37.2 C)  Height: 5\' 6"  (1.676 m)  Weight: 232 lb 4.8 oz (105.371 kg)  Alert and oriented. Skin warm and dry. Oral mucosa is moist.   . Sclera anicteric, conjunctivae is pink. Thyroid not enlarged. No cervical lymphadenopathy. Lungs clear. Heart regular rate and rhythm.  Abdomen is soft. Bowel sounds are positive. No hepatomegaly. No abdominal masses felt.Tenderness rt lower quandrant and umbilicus.  No edema to lower extremities.  Stool brown and guaiac negative.      Assessment:   Rt lower quadrant pain.  Rectal bleed.  Inflammatory bowel disease needs to be ruled out. Family hx of Crohn's disease. Colonic neoplasm is also in the differential.    I discussed this case with Dr. Karilyn Cota. Plan:    Colonoscopy with Dr. Karilyn Cota.

## 2012-08-09 ENCOUNTER — Encounter (INDEPENDENT_AMBULATORY_CARE_PROVIDER_SITE_OTHER): Payer: Self-pay | Admitting: Internal Medicine

## 2012-08-22 ENCOUNTER — Ambulatory Visit (HOSPITAL_COMMUNITY)
Admission: RE | Admit: 2012-08-22 | Discharge: 2012-08-22 | Disposition: A | Discharge status: Home / Self Care | Payer: BC Managed Care – PPO | Source: Ambulatory Visit | Attending: Internal Medicine | Admitting: Internal Medicine

## 2012-08-22 ENCOUNTER — Encounter (HOSPITAL_COMMUNITY): Payer: Self-pay | Admitting: *Deleted

## 2012-08-22 ENCOUNTER — Encounter (HOSPITAL_COMMUNITY)
Admission: RE | Disposition: A | Discharge status: Home / Self Care | Payer: Self-pay | Source: Ambulatory Visit | Attending: Internal Medicine

## 2012-08-22 DIAGNOSIS — R1031 Right lower quadrant pain: Secondary | ICD-10-CM

## 2012-08-22 DIAGNOSIS — K921 Melena: Secondary | ICD-10-CM | POA: Insufficient documentation

## 2012-08-22 DIAGNOSIS — K644 Residual hemorrhoidal skin tags: Secondary | ICD-10-CM | POA: Insufficient documentation

## 2012-08-22 DIAGNOSIS — I10 Essential (primary) hypertension: Secondary | ICD-10-CM | POA: Insufficient documentation

## 2012-08-22 DIAGNOSIS — D126 Benign neoplasm of colon, unspecified: Secondary | ICD-10-CM | POA: Insufficient documentation

## 2012-08-22 DIAGNOSIS — K625 Hemorrhage of anus and rectum: Secondary | ICD-10-CM

## 2012-08-22 DIAGNOSIS — R109 Unspecified abdominal pain: Secondary | ICD-10-CM | POA: Insufficient documentation

## 2012-08-22 HISTORY — PX: COLONOSCOPY: SHX5424

## 2012-08-22 SURGERY — COLONOSCOPY
Anesthesia: Moderate Sedation

## 2012-08-22 MED ORDER — MIDAZOLAM HCL 5 MG/5ML IJ SOLN
INTRAMUSCULAR | Status: DC
Start: 2012-08-22 — End: 2012-08-22
  Filled 2012-08-22: qty 5

## 2012-08-22 MED ORDER — MIDAZOLAM HCL 5 MG/5ML IJ SOLN
INTRAMUSCULAR | Status: DC | PRN
  Administered 2012-08-22 (×5): 3 mg via INTRAVENOUS
  Administered 2012-08-22: 2 mg via INTRAVENOUS
  Administered 2012-08-22: 3 mg via INTRAVENOUS

## 2012-08-22 MED ORDER — MIDAZOLAM HCL 5 MG/5ML IJ SOLN
INTRAMUSCULAR | Status: AC
  Filled 2012-08-22: qty 10

## 2012-08-22 MED ORDER — PROMETHAZINE HCL 25 MG/ML IJ SOLN
INTRAMUSCULAR | Status: AC
  Filled 2012-08-22: qty 1

## 2012-08-22 MED ORDER — SODIUM CHLORIDE 0.45 % IV SOLN: INTRAVENOUS | Status: DC

## 2012-08-22 MED ORDER — SODIUM CHLORIDE 0.9 % IV SOLN
INTRAVENOUS | Status: DC
  Administered 2012-08-22: 12:00:00 via INTRAVENOUS

## 2012-08-22 MED ORDER — STERILE WATER FOR IRRIGATION IR SOLN
Status: DC | PRN
  Administered 2012-08-22: 13:00:00

## 2012-08-22 MED ORDER — NYSTATIN-TRIAMCINOLONE 100000-0.1 UNIT/GM-% EX CREA: TOPICAL_CREAM | Freq: Two times a day (BID) | CUTANEOUS | Status: DC

## 2012-08-22 MED ORDER — MIDAZOLAM HCL 5 MG/5ML IJ SOLN
INTRAMUSCULAR | Status: AC
  Filled 2012-08-22: qty 5

## 2012-08-22 MED ORDER — MEPERIDINE HCL 50 MG/ML IJ SOLN
INTRAMUSCULAR | Status: AC
  Filled 2012-08-22: qty 1

## 2012-08-22 MED ORDER — SODIUM CHLORIDE 0.9 % IJ SOLN
INTRAMUSCULAR | Status: AC
  Filled 2012-08-22: qty 10

## 2012-08-22 MED ORDER — PROMETHAZINE HCL 25 MG/ML IJ SOLN
INTRAMUSCULAR | Status: DC | PRN
  Administered 2012-08-22: 25 mg via INTRAVENOUS

## 2012-08-22 MED ORDER — MEPERIDINE HCL 50 MG/ML IJ SOLN
INTRAMUSCULAR | Status: DC | PRN
  Administered 2012-08-22 (×2): 25 mg via INTRAVENOUS

## 2012-08-22 NOTE — Op Note (Addendum)
COLONOSCOPY PROCEDURE REPORT  PATIENT:  Sharon Vazquez  MR#:  [id number] Birthdate:  1981/03/16, 32 y.o., female Endoscopist:  Dr. Malissa Hippo, MD Procedure Date: 08/22/2012  Procedure:   Colonoscopy with snare polypectomy.  Indications:  Patient is 32 year old African female who presents with 6 week history of intermittent hematochezia and lower abdominal pain. She also has irregular bowel movements but predominantly with constipation. She is doing better with Linzess.  Informed Consent:  The procedure and risks were reviewed with the patient and informed consent was obtained.  Medications:  Demerol 50 mg IV Versed 20 mg IV Promethazine 25 mg IV in diluted form  Description of procedure:  After a digital rectal exam was performed, that colonoscope was advanced from the anus through the rectum and colon to the area of the cecum, ileocecal valve and appendiceal orifice. The cecum was deeply intubated. These structures were well-seen and photographed for the record. From the level of the cecum and ileocecal valve, the scope was slowly and cautiously withdrawn. The mucosal surfaces were carefully surveyed utilizing scope tip to flexion to facilitate fold flattening as needed. The scope was pulled down into the rectum where a thorough exam including retroflexion was performed. Terminal ileum was also examined.  Findings:  Prep execellent Normal mucosa of terminal ileum. Single diverticulum at cecum. 10 mm broad this polyp snared from cecum and removed using Roth net. Mucosa rest of the colon was normal. Normal rectal mucosa. Mall hemorrhoids below the dentate line.   Therapeutic/Diagnostic Maneuvers Performed:  See above  Complications:  None  Cecal Withdrawal Time:  14 minutes  Impression:  Normal terminal ileum. Single diverticulum at cecum. 10 mm broad-based polyp snared from cecum. No evidence of endoscopic colitis. Small external hemorrhoids.  Recommendations:   Standard instructions given. Mycolog-II cream to be applied to anal canal twice a day for 2 weeks then as needed. Metamucil 3-4 g by mouth daily. I will contact patient with biopsy results and further recommendations.  Bartlett Enke U  08/22/2012 1:44 PM  CC: Dr. Hoy Finlay, Provider, MD & Dr. Bonnetta Barry ref. provider found

## 2012-08-22 NOTE — H&P (Addendum)
Sharon Vazquez is an 32 y.o. female.   Chief Complaint: Patient is here for colonoscopy. HPI: This 32 year old African female who presents with six week history of intermittent hematochezia. For some. She passed large amount of bright red blood per rectum. She also complains of pain across lower abdomen which is worse postprandially and before she has a bowel movement. She also has irregular bowel movements. She has diarrhea and/or constipation she states she has more constipation than diarrhea. She has lost 5 pounds recently. Family history is significant for Crohn's colitis in one of her uncles.  Past Medical History  Diagnosis Date  . Hypertension   . Anxiety   . Bipolar 1 disorder   . ADHD (attention deficit hyperactivity disorder)   . Borderline personality disorder   . Panic attack     Past Surgical History  Procedure Laterality Date  . Wisdom tooth extraction    . Bunion removal    . Cholecystectomy  10 yrs ago    History reviewed. No pertinent family history. Social History:  reports that she has been smoking Cigarettes.  She has been smoking about 0.50 packs per day. She does not have any smokeless tobacco history on file. She reports that  drinks alcohol. She reports that she does not use illicit drugs.  Allergies:  Allergies  Allergen Reactions  . Bee Venom Anaphylaxis  . Dilaudid (Hydromorphone Hcl) Anaphylaxis and Hives  . Peanut-Containing Drug Products Anaphylaxis  . Senna Anaphylaxis and Hives  . Adhesive (Tape) Rash    Medications Prior to Admission  Medication Sig Dispense Refill  . ARIPiprazole (ABILIFY) 5 MG tablet Take 2.5 mg by mouth daily.      . cloNIDine (CATAPRES) 0.1 MG tablet Take 0.1 mg by mouth 2 (two) times daily.      Marland Kitchen doxepin (SINEQUAN) 25 MG capsule Take 25 mg by mouth 2 (two) times daily.      . hyoscyamine (LEVSIN/SL) 0.125 MG SL tablet Place 1 tablet (0.125 mg total) under the tongue every 4 (four) hours as needed for cramping.  90  tablet  2  . albuterol (PROVENTIL HFA;VENTOLIN HFA) 108 (90 BASE) MCG/ACT inhaler Inhale 2 puffs into the lungs every 6 (six) hours as needed for wheezing or shortness of breath.      . alprazolam (XANAX) 2 MG tablet Take 2 mg by mouth 3 (three) times daily as needed for anxiety.       Marland Kitchen aspirin-acetaminophen-caffeine (EXCEDRIN MIGRAINE) 250-250-65 MG per tablet Take 1 tablet by mouth every 6 (six) hours as needed for pain.      Marland Kitchen ketotifen (ZADITOR) 0.025 % ophthalmic solution Place 1 drop into both eyes daily as needed (Itchy Eyes).      . Linaclotide (LINZESS) 145 MCG CAPS Take 145 mcg by mouth daily.      Marland Kitchen lurasidone (LATUDA) 80 MG TABS Take 80 mg by mouth at bedtime.      . medroxyPROGESTERone (DEPO-PROVERA) 150 MG/ML injection Inject 150 mg into the muscle every 3 (three) months.      . niacin (NIASPAN) 1000 MG CR tablet Take 1,000 mg by mouth at bedtime.      . peg 3350 powder (MOVIPREP) 100 G SOLR Take 1 kit (100 g total) by mouth once.  1 kit  0    No results found for this or any previous visit (from the past 48 hour(s)). No results found.  ROS  Blood pressure 125/82, temperature 98.2 F (36.8 C), temperature source Oral, resp.  rate 20, SpO2 94.00%. Physical Exam  Constitutional: She appears well-developed and well-nourished.  HENT:  Mouth/Throat: Oropharynx is clear and moist.  Eyes: Conjunctivae are normal. No scleral icterus.  Neck: No thyromegaly present.  Cardiovascular: Normal rate, regular rhythm and normal heart sounds.   No murmur heard. Respiratory: Effort normal and breath sounds normal.  GI: Soft. She exhibits no distension and no mass. Tenderness: mild periumbilicus and right lower quadrant.  Musculoskeletal: She exhibits no edema.  Lymphadenopathy:    She has no cervical adenopathy.  Neurological: She is alert.  Skin: Skin is warm and dry.     Assessment/Plan Recurrent hematochezia and lower abdominal pain. Irregular bowel movements. Diagnostic  colonoscopy.  REHMAN,NAJEEB U 08/22/2012, 1:02 PM

## 2012-08-22 NOTE — OR Nursing (Signed)
Has tonque ring , belly ring, wedding rings.  Unable to remove tonque ring and belly ring.  Wedding rings given to husband by patient.  Reinforced to patient that all metal should be removed incase cautery is used during procedure  that it could result  in a burn . She understands and ok with leaving jewelry in.

## 2012-08-28 ENCOUNTER — Encounter (HOSPITAL_COMMUNITY): Payer: Self-pay | Admitting: Internal Medicine

## 2012-09-04 ENCOUNTER — Telehealth (INDEPENDENT_AMBULATORY_CARE_PROVIDER_SITE_OTHER): Payer: Self-pay | Admitting: *Deleted

## 2012-09-04 NOTE — Telephone Encounter (Signed)
These are Dr.Rehman's findings and plan : Impression:  Normal terminal ileum.  Single diverticulum at cecum.  10 mm broad-based polyp snared from cecum.  No evidence of endoscopic colitis.  Small external hemorrhoids.  Recommendations:  Standard instructions given.  Mycolog-II cream to be applied to anal canal twice a day for 2 weeks then as needed.  Metamucil 3-4 g by mouth daily.  I will contact patient with biopsy results and further recommendations.

## 2012-09-04 NOTE — Telephone Encounter (Signed)
Would like to get her TCS results. She is also having painful hard stools. The return phone number is [phone] or [phone].

## 2012-09-06 NOTE — Telephone Encounter (Signed)
I called the patient and went over the Colonoscopy. She says that she has tried high fiber diet , Metamucil ,Miralax,Mag Citrate, Epsom Salts. Her bowel movements are every 4-5 days and they are very hard and painful so painful that she does not want to go to the bathroom. She says that she is taking the Bentyl She would like to know what she can do or if there is something else that she that Sharon Vazquez gave her  every 4 hours and it is not working. She ask if there is something else that she can take or is there something else that can be done to help with this.

## 2012-09-06 NOTE — Telephone Encounter (Signed)
Patient called and given Dr.Rehman's recommendation. She ask that [name] call her tomorrow or Monday with her appointment for 3 weeks

## 2012-09-06 NOTE — Telephone Encounter (Signed)
Patient should stop bentyl and use fleet enema every other day. Keep stool diary until OV in 3 weeks.

## 2012-09-09 NOTE — Telephone Encounter (Signed)
Already has been addressed

## 2012-09-10 ENCOUNTER — Encounter (INDEPENDENT_AMBULATORY_CARE_PROVIDER_SITE_OTHER): Payer: Self-pay | Admitting: *Deleted

## 2012-09-12 ENCOUNTER — Encounter: Payer: Self-pay | Admitting: *Deleted

## 2012-09-12 DIAGNOSIS — B009 Herpesviral infection, unspecified: Secondary | ICD-10-CM

## 2012-09-13 ENCOUNTER — Encounter: Payer: Self-pay | Admitting: Obstetrics & Gynecology

## 2012-09-13 ENCOUNTER — Ambulatory Visit (INDEPENDENT_AMBULATORY_CARE_PROVIDER_SITE_OTHER): Payer: BC Managed Care – PPO | Admitting: Obstetrics & Gynecology

## 2012-09-13 ENCOUNTER — Ambulatory Visit: Payer: Self-pay

## 2012-09-13 VITALS — BP 128/80 | Wt 242.0 lb

## 2012-09-13 DIAGNOSIS — IMO0001 Reserved for inherently not codable concepts without codable children: Secondary | ICD-10-CM

## 2012-09-13 DIAGNOSIS — Z3202 Encounter for pregnancy test, result negative: Secondary | ICD-10-CM

## 2012-09-13 DIAGNOSIS — Z3049 Encounter for surveillance of other contraceptives: Secondary | ICD-10-CM

## 2012-09-13 LAB — POCT URINE PREGNANCY: Preg Test, Ur: NEGATIVE

## 2012-09-13 MED ORDER — MEDROXYPROGESTERONE ACETATE 150 MG/ML IM SUSP
150.0000 mg | Freq: Once | INTRAMUSCULAR | Status: AC
  Administered 2012-09-13: 150 mg via INTRAMUSCULAR

## 2012-09-17 NOTE — Telephone Encounter (Signed)
Apt has been scheduled for 09/18/12 with Dorene Ar, NP. Azul said the medicine given to her is not working.

## 2012-09-18 ENCOUNTER — Ambulatory Visit (INDEPENDENT_AMBULATORY_CARE_PROVIDER_SITE_OTHER): Payer: BC Managed Care – PPO | Admitting: Internal Medicine

## 2012-09-18 ENCOUNTER — Encounter (INDEPENDENT_AMBULATORY_CARE_PROVIDER_SITE_OTHER): Payer: Self-pay | Admitting: Internal Medicine

## 2012-09-18 VITALS — BP 104/66 | HR 116 | Ht 66.0 in | Wt 241.7 lb

## 2012-09-18 DIAGNOSIS — K5900 Constipation, unspecified: Secondary | ICD-10-CM

## 2012-09-18 NOTE — Progress Notes (Addendum)
Subjective:     Patient ID: Sharon Vazquez, female   DOB: Aug 14, 1980, 32 y.o.   MRN: [id number]  HPI Here today for f/u. She tells me she still has some rectal bleeding when she has a BM. She usually has a BM about once a week. She is taking Linzess for her constipation. If she takes the LInzess on a daily basis, she will have diarrhea. She is straining to have a BM. Her BMs are brown in color. She is taking Metamucil on a daily basis.  Appetite is good. She has gained 9 pounds since her last visit. She received her Depo shot last Tuesday.  Her back is hurting today and is taking Tramadol and Flexeril BID for her back 08/22/2012 colonoscopy: Normal terminal ileum.  Single diverticulum at cecum.  10 mm broad-based polyp snared from cecum.  No evidence of endoscopic colitis.  Small external hemorrhoids.  Biopsy: Sessile serrated adenoma/polyp with dysplasia. No high grade dysplasia or malignancy.  Appreciable resection margin, negative for dysplasia or malignancy.    Review of Systems see hpi Current Outpatient Prescriptions  Medication Sig Dispense Refill  . albuterol (PROVENTIL HFA;VENTOLIN HFA) 108 (90 BASE) MCG/ACT inhaler Inhale 2 puffs into the lungs every 6 (six) hours as needed for wheezing or shortness of breath.      . alprazolam (XANAX) 2 MG tablet Take 2 mg by mouth 3 (three) times daily as needed for anxiety.       . ARIPiprazole (ABILIFY) 5 MG tablet Take 2.5 mg by mouth daily.      . cloNIDine (CATAPRES) 0.1 MG tablet Take 0.1 mg by mouth 2 (two) times daily.      Marland Kitchen doxepin (SINEQUAN) 25 MG capsule Take 25 mg by mouth 2 (two) times daily.      Marland Kitchen ketotifen (ZADITOR) 0.025 % ophthalmic solution Place 1 drop into both eyes daily as needed (Itchy Eyes).      . Linaclotide (LINZESS) 145 MCG CAPS Take 145 mcg by mouth daily.      Marland Kitchen lurasidone (LATUDA) 80 MG TABS Take 80 mg by mouth at bedtime.      . medroxyPROGESTERone (DEPO-PROVERA) 150 MG/ML injection Inject 150 mg  into the muscle every 3 (three) months.      . nystatin-triamcinolone (MYCOLOG II) cream Apply topically 2 (two) times daily. Apply cream to anal canal twice daily for 2 weeks and then as needed.  60 g  0  . Psyllium (METAMUCIL PO) Take by mouth 2 (two) times daily.      . traMADol (ULTRAM) 50 MG tablet Take 50 mg by mouth every 6 (six) hours as needed for pain.      Marland Kitchen triamcinolone (KENALOG) 0.025 % cream Apply topically 2 (two) times daily.      Marland Kitchen DOXEPIN HCL PO Take by mouth 2 (two) times daily.      . hyoscyamine (LEVSIN/SL) 0.125 MG SL tablet Place 1 tablet (0.125 mg total) under the tongue every 4 (four) hours as needed for cramping.  90 tablet  2   No current facility-administered medications for this visit.   Past Medical History  Diagnosis Date  . Hypertension   . Anxiety   . Bipolar 1 disorder   . ADHD (attention deficit hyperactivity disorder)   . Borderline personality disorder   . Panic attack   . Headache   . Panic disorder   . HSV-2 (herpes simplex virus 2) infection   . Hx of chlamydia infection   .  IBS (irritable bowel syndrome)    .Current outpatient prescriptions:albuterol (PROVENTIL HFA;VENTOLIN HFA) 108 (90 BASE) MCG/ACT inhaler, Inhale 2 puffs into the lungs every 6 (six) hours as needed for wheezing or shortness of breath., Disp: , Rfl: ;  alprazolam (XANAX) 2 MG tablet, Take 2 mg by mouth 3 (three) times daily as needed for anxiety. , Disp: , Rfl: ;  ARIPiprazole (ABILIFY) 5 MG tablet, Take 2.5 mg by mouth daily., Disp: , Rfl:  cloNIDine (CATAPRES) 0.1 MG tablet, Take 0.1 mg by mouth 2 (two) times daily., Disp: , Rfl: ;  doxepin (SINEQUAN) 25 MG capsule, Take 25 mg by mouth 2 (two) times daily., Disp: , Rfl: ;  ketotifen (ZADITOR) 0.025 % ophthalmic solution, Place 1 drop into both eyes daily as needed (Itchy Eyes)., Disp: , Rfl: ;  Linaclotide (LINZESS) 145 MCG CAPS, Take 145 mcg by mouth daily., Disp: , Rfl:  lurasidone (LATUDA) 80 MG TABS, Take 80 mg by mouth at  bedtime., Disp: , Rfl: ;  medroxyPROGESTERone (DEPO-PROVERA) 150 MG/ML injection, Inject 150 mg into the muscle every 3 (three) months., Disp: , Rfl: ;  nystatin-triamcinolone (MYCOLOG II) cream, Apply topically 2 (two) times daily. Apply cream to anal canal twice daily for 2 weeks and then as needed., Disp: 60 g, Rfl: 0 Psyllium (METAMUCIL PO), Take by mouth 2 (two) times daily., Disp: , Rfl: ;  traMADol (ULTRAM) 50 MG tablet, Take 50 mg by mouth every 6 (six) hours as needed for pain., Disp: , Rfl: ;  triamcinolone (KENALOG) 0.025 % cream, Apply topically 2 (two) times daily., Disp: , Rfl: ;  DOXEPIN HCL PO, Take by mouth 2 (two) times daily., Disp: , Rfl:  hyoscyamine (LEVSIN/SL) 0.125 MG SL tablet, Place 1 tablet (0.125 mg total) under the tongue every 4 (four) hours as needed for cramping., Disp: 90 tablet, Rfl: 2 Allergies  Allergen Reactions  . Bee Venom Anaphylaxis  . Dilaudid (Hydromorphone Hcl) Anaphylaxis and Hives  . Peanut-Containing Drug Products Anaphylaxis  . Senna Anaphylaxis and Hives  . Adhesive (Tape) Rash        Objective:   Physical Exam   Filed Vitals:   09/18/12 1033  BP: 104/66  Pulse: 116  Height: 5\' 6"  (1.676 m)  Weight: 241 lb 11.2 oz (109.634 kg)  Alert and oriented. Skin warm and dry. Oral mucosa is moist.   . Sclera anicteric, conjunctivae is pink. Fullness to anterior neck. No cervical lymphadenopathy. Lungs clear. Heart regular rate and rhythm.  Abdomen is soft. Bowel sounds are positive. No hepatomegaly. No abdominal masses felt. No tenderness.  No edema to lower extremities.         Assessment:    Constipation. Linzess on a daily basis caused diarrhea. Recent colonoscopy     Plan:    Metamucil daily. Linzess to every other day.  If no BM in 3 days Fleets enema. F/u with PCP concerning fullness to anterior neck.

## 2012-09-18 NOTE — Patient Instructions (Addendum)
Linzess every other day. If no BM in 3 days: Fleets enema

## 2012-10-23 ENCOUNTER — Encounter (HOSPITAL_COMMUNITY): Payer: Self-pay | Admitting: *Deleted

## 2012-10-23 ENCOUNTER — Emergency Department (HOSPITAL_COMMUNITY)
Admission: EM | Admit: 2012-10-23 | Discharge: 2012-10-24 | Disposition: A | Discharge status: Home / Self Care | Payer: BC Managed Care – PPO | Attending: Emergency Medicine | Admitting: Emergency Medicine

## 2012-10-23 DIAGNOSIS — F319 Bipolar disorder, unspecified: Secondary | ICD-10-CM | POA: Insufficient documentation

## 2012-10-23 DIAGNOSIS — F410 Panic disorder [episodic paroxysmal anxiety] without agoraphobia: Secondary | ICD-10-CM | POA: Insufficient documentation

## 2012-10-23 DIAGNOSIS — R51 Headache: Secondary | ICD-10-CM | POA: Insufficient documentation

## 2012-10-23 DIAGNOSIS — I10 Essential (primary) hypertension: Secondary | ICD-10-CM | POA: Insufficient documentation

## 2012-10-23 DIAGNOSIS — R42 Dizziness and giddiness: Secondary | ICD-10-CM | POA: Insufficient documentation

## 2012-10-23 DIAGNOSIS — F909 Attention-deficit hyperactivity disorder, unspecified type: Secondary | ICD-10-CM | POA: Insufficient documentation

## 2012-10-23 DIAGNOSIS — R110 Nausea: Secondary | ICD-10-CM | POA: Insufficient documentation

## 2012-10-23 DIAGNOSIS — F411 Generalized anxiety disorder: Secondary | ICD-10-CM | POA: Insufficient documentation

## 2012-10-23 DIAGNOSIS — H53149 Visual discomfort, unspecified: Secondary | ICD-10-CM | POA: Insufficient documentation

## 2012-10-23 DIAGNOSIS — Z8719 Personal history of other diseases of the digestive system: Secondary | ICD-10-CM | POA: Insufficient documentation

## 2012-10-23 DIAGNOSIS — G8929 Other chronic pain: Secondary | ICD-10-CM | POA: Insufficient documentation

## 2012-10-23 DIAGNOSIS — G4700 Insomnia, unspecified: Secondary | ICD-10-CM | POA: Insufficient documentation

## 2012-10-23 DIAGNOSIS — Z8619 Personal history of other infectious and parasitic diseases: Secondary | ICD-10-CM | POA: Insufficient documentation

## 2012-10-23 DIAGNOSIS — Z79899 Other long term (current) drug therapy: Secondary | ICD-10-CM | POA: Insufficient documentation

## 2012-10-23 DIAGNOSIS — Z8659 Personal history of other mental and behavioral disorders: Secondary | ICD-10-CM | POA: Insufficient documentation

## 2012-10-23 DIAGNOSIS — Z87891 Personal history of nicotine dependence: Secondary | ICD-10-CM | POA: Insufficient documentation

## 2012-10-23 MED ORDER — KETOROLAC TROMETHAMINE 60 MG/2ML IM SOLN
60.0000 mg | Freq: Once | INTRAMUSCULAR | Status: AC
  Administered 2012-10-24: 60 mg via INTRAMUSCULAR
  Filled 2012-10-23: qty 2

## 2012-10-23 MED ORDER — HYDROCODONE-ACETAMINOPHEN 5-325 MG PO TABS
2.0000 | ORAL_TABLET | Freq: Once | ORAL | Status: AC
  Administered 2012-10-24: 2 via ORAL
  Filled 2012-10-23: qty 2

## 2012-10-23 MED ORDER — HYDROCODONE-ACETAMINOPHEN 5-325 MG PO TABS: 1.0000 | ORAL_TABLET | ORAL | Status: DC | PRN

## 2012-10-23 NOTE — ED Notes (Signed)
Headache for 2 weeks no NV, "dizzy and light headed" Seen by [facility] yesterday and given phenergan. No HI.

## 2012-10-23 NOTE — ED Notes (Signed)
Aunt is sitting in room with the patient.  Patient is sleeping soundly.  Aunt states the Phenergan has apparently "caught up with her"   Patient sleeps through our conversation.

## 2012-10-23 NOTE — ED Provider Notes (Signed)
History     CSN: [id number]  Arrival date & time 10/23/12  2156   First MD Initiated Contact with Patient 10/23/12 2324      Chief Complaint  Patient presents with  . Headache    (Consider location/radiation/quality/duration/timing/severity/associated sxs/prior treatment) Patient is a 32 y.o. female presenting with headaches. The history is provided by the patient.  Headache Pain location:  R temporal and L temporal Quality:  Sharp Radiates to:  L neck and R neck Severity currently:  7/10 Onset quality:  Gradual Duration:  2 weeks Timing:  Intermittent Progression:  Worsening Chronicity:  Chronic Similar to prior headaches: yes   Context: activity and bright light   Relieved by:  Nothing Worsened by:  Nothing tried Ineffective treatments: promethazine. Associated symptoms: dizziness, nausea and photophobia   Associated symptoms: no abdominal pain, no back pain, no cough, no fever, no neck pain and no seizures   Risk factors: insomnia   Risk factors: no family hx of SAH     Past Medical History  Diagnosis Date  . Hypertension   . Anxiety   . Bipolar 1 disorder   . ADHD (attention deficit hyperactivity disorder)   . Borderline personality disorder   . Panic attack   . Headache   . Panic disorder   . HSV-2 (herpes simplex virus 2) infection   . Hx of chlamydia infection   . IBS (irritable bowel syndrome)     Past Surgical History  Procedure Laterality Date  . Wisdom tooth extraction    . Bunion removal    . Cholecystectomy  10 yrs ago  . Colonoscopy N/A 08/22/2012    Procedure: COLONOSCOPY;  Surgeon: Malissa Hippo, MD;  Location: [facility];  Service: Endoscopy;  Laterality: N/A;  100    Family History  Problem Relation Age of Onset  . Hypertension Mother   . Diabetes Mother   . Hypertension Maternal Aunt   . Diabetes Maternal Aunt   . Diabetes Maternal Grandmother   . Stroke Maternal Grandmother   . Diabetes Father   . Diabetes Paternal  Grandmother   . Crohn's disease Maternal Uncle   . Diabetes Cousin     History  Substance Use Topics  . Smoking status: Former Smoker -- 0.50 packs/day    Types: Cigarettes    Quit date: 10/02/2012  . Smokeless tobacco: Not on file     Comment: Smokes 1/2 pack a day x 12 yrs. She did smoke 3 packs a day.  . Alcohol Use: Yes     Comment: " a beer every couple of days"    OB History   Grav Para Term Preterm Abortions TAB SAB Ect Mult Living                  Review of Systems  Constitutional: Negative for fever and activity change.       All ROS Neg except as noted in HPI  HENT: Negative for nosebleeds and neck pain.   Eyes: Positive for photophobia. Negative for discharge.  Respiratory: Negative for cough, shortness of breath and wheezing.   Cardiovascular: Negative for chest pain and palpitations.  Gastrointestinal: Positive for nausea. Negative for abdominal pain and blood in stool.  Genitourinary: Negative for dysuria, frequency and hematuria.  Musculoskeletal: Negative for back pain and arthralgias.  Skin: Negative.   Neurological: Positive for dizziness and headaches. Negative for seizures and speech difficulty.  Psychiatric/Behavioral: Negative for hallucinations and confusion.    Allergies  Bee venom;  Dilaudid; Peanut-containing drug products; Senna; and Adhesive  Home Medications   Current Outpatient Rx  Name  Route  Sig  Dispense  Refill  . albuterol (PROVENTIL HFA;VENTOLIN HFA) 108 (90 BASE) MCG/ACT inhaler   Inhalation   Inhale 2 puffs into the lungs every 6 (six) hours as needed for wheezing or shortness of breath.         . alprazolam (XANAX) 2 MG tablet   Oral   Take 2 mg by mouth 3 (three) times daily as needed for anxiety.          . ARIPiprazole (ABILIFY) 5 MG tablet   Oral   Take 2.5 mg by mouth daily.         . cloNIDine (CATAPRES) 0.1 MG tablet   Oral   Take 0.1 mg by mouth 2 (two) times daily.         Marland Kitchen doxepin (SINEQUAN) 25 MG  capsule   Oral   Take 25 mg by mouth 2 (two) times daily.         Marland Kitchen ketotifen (ZADITOR) 0.025 % ophthalmic solution   Both Eyes   Place 1 drop into both eyes daily as needed (Itchy Eyes).         Marland Kitchen lurasidone (LATUDA) 80 MG TABS   Oral   Take 80 mg by mouth at bedtime.         Marland Kitchen nystatin-triamcinolone (MYCOLOG II) cream   Topical   Apply topically 2 (two) times daily. Apply cream to anal canal twice daily for 2 weeks and then as needed.   60 g   0   . promethazine (PHENERGAN) 25 MG tablet   Oral   Take 25 mg by mouth every 6 (six) hours as needed for nausea.         . Psyllium (METAMUCIL PO)   Oral   Take by mouth daily.          Marland Kitchen triamcinolone (KENALOG) 0.025 % cream   Topical   Apply topically 2 (two) times daily.         . Linaclotide (LINZESS) 145 MCG CAPS   Oral   Take 145 mcg by mouth daily.         . medroxyPROGESTERone (DEPO-PROVERA) 150 MG/ML injection   Intramuscular   Inject 150 mg into the muscle every 3 (three) months.           BP 118/93  Pulse 113  Temp(Src) 98.5 F (36.9 C) (Oral)  Resp 20  Ht 5\' 6"  (1.676 m)  Wt 241 lb (109.317 kg)  BMI 38.92 kg/m2  SpO2 98%  LMP 09/04/2012  Physical Exam  Nursing note and vitals reviewed. Constitutional: She is oriented to person, place, and time. She appears well-developed and well-nourished.  Non-toxic appearance.  HENT:  Head: Normocephalic.  Right Ear: Tympanic membrane and external ear normal.  Left Ear: Tympanic membrane and external ear normal.  Eyes: EOM and lids are normal. Pupils are equal, round, and reactive to light.  Neck: Normal range of motion. Neck supple. Carotid bruit is not present.  Cardiovascular: Normal rate, regular rhythm, normal heart sounds, intact distal pulses and normal pulses.   Pulmonary/Chest: Breath sounds normal. No respiratory distress.  Abdominal: Soft. Bowel sounds are normal. There is no tenderness. There is no guarding.  Musculoskeletal: Normal  range of motion.  Lymphadenopathy:       Head (right side): No submandibular adenopathy present.       Head (left side): No submandibular  adenopathy present.    She has no cervical adenopathy.  Neurological: She is alert and oriented to person, place, and time. She has normal strength. No cranial nerve deficit or sensory deficit. She exhibits normal muscle tone. Coordination normal.  Skin: Skin is warm and dry.  Psychiatric: She has a normal mood and affect. Her speech is normal.    ED Course  Procedures (including critical care time)  Labs Reviewed - No data to display No results found.   No diagnosis found.    MDM  I have reviewed nursing notes, vital signs, and all appropriate lab and imaging results for this patient. Pt reports headache for 2 weeks. Tried promethazine without improvement. No gross neuro deficit noted. Plan - Rx for norco q4h prn pain. #12. Pt referred to Dr Gerilyn Pilgrim for headache evaluation.       Kathie Dike, PA-C 10/23/12 2341

## 2012-10-23 NOTE — ED Notes (Signed)
Snoring and will arouse easily.  States she has taken three phenergan tablets today, has been sleepy and falls asleep but when she wakes her headach is still there.  Also co pain right side of head if she coughs

## 2012-10-24 NOTE — ED Provider Notes (Signed)
Medical screening examination/treatment/procedure(s) were performed by non-physician practitioner and as supervising physician I was immediately available for consultation/collaboration.  Aundreya Souffrant S. Avanni Turnbaugh, MD 10/24/12 0148 

## 2012-10-25 ENCOUNTER — Emergency Department (HOSPITAL_COMMUNITY): Payer: BC Managed Care – PPO

## 2012-10-25 ENCOUNTER — Emergency Department (HOSPITAL_COMMUNITY)
Admission: EM | Admit: 2012-10-25 | Discharge: 2012-10-25 | Disposition: A | Discharge status: Home / Self Care | Payer: BC Managed Care – PPO | Attending: Emergency Medicine | Admitting: Emergency Medicine

## 2012-10-25 ENCOUNTER — Encounter (HOSPITAL_COMMUNITY): Payer: Self-pay | Admitting: Emergency Medicine

## 2012-10-25 DIAGNOSIS — S161XXA Strain of muscle, fascia and tendon at neck level, initial encounter: Secondary | ICD-10-CM

## 2012-10-25 DIAGNOSIS — S298XXA Other specified injuries of thorax, initial encounter: Secondary | ICD-10-CM | POA: Insufficient documentation

## 2012-10-25 DIAGNOSIS — Z3202 Encounter for pregnancy test, result negative: Secondary | ICD-10-CM | POA: Insufficient documentation

## 2012-10-25 DIAGNOSIS — Z87891 Personal history of nicotine dependence: Secondary | ICD-10-CM | POA: Insufficient documentation

## 2012-10-25 DIAGNOSIS — Z8619 Personal history of other infectious and parasitic diseases: Secondary | ICD-10-CM | POA: Insufficient documentation

## 2012-10-25 DIAGNOSIS — Y92410 Unspecified street and highway as the place of occurrence of the external cause: Secondary | ICD-10-CM | POA: Insufficient documentation

## 2012-10-25 DIAGNOSIS — I10 Essential (primary) hypertension: Secondary | ICD-10-CM | POA: Insufficient documentation

## 2012-10-25 DIAGNOSIS — S139XXA Sprain of joints and ligaments of unspecified parts of neck, initial encounter: Secondary | ICD-10-CM | POA: Insufficient documentation

## 2012-10-25 DIAGNOSIS — Z8719 Personal history of other diseases of the digestive system: Secondary | ICD-10-CM | POA: Insufficient documentation

## 2012-10-25 DIAGNOSIS — R000 Tachycardia, unspecified: Secondary | ICD-10-CM | POA: Insufficient documentation

## 2012-10-25 DIAGNOSIS — Y9389 Activity, other specified: Secondary | ICD-10-CM | POA: Insufficient documentation

## 2012-10-25 DIAGNOSIS — R109 Unspecified abdominal pain: Secondary | ICD-10-CM | POA: Insufficient documentation

## 2012-10-25 DIAGNOSIS — IMO0002 Reserved for concepts with insufficient information to code with codable children: Secondary | ICD-10-CM | POA: Insufficient documentation

## 2012-10-25 DIAGNOSIS — T07XXXA Unspecified multiple injuries, initial encounter: Secondary | ICD-10-CM

## 2012-10-25 DIAGNOSIS — F410 Panic disorder [episodic paroxysmal anxiety] without agoraphobia: Secondary | ICD-10-CM | POA: Insufficient documentation

## 2012-10-25 DIAGNOSIS — F319 Bipolar disorder, unspecified: Secondary | ICD-10-CM | POA: Insufficient documentation

## 2012-10-25 DIAGNOSIS — E669 Obesity, unspecified: Secondary | ICD-10-CM | POA: Insufficient documentation

## 2012-10-25 DIAGNOSIS — Z8659 Personal history of other mental and behavioral disorders: Secondary | ICD-10-CM | POA: Insufficient documentation

## 2012-10-25 DIAGNOSIS — Z79899 Other long term (current) drug therapy: Secondary | ICD-10-CM | POA: Insufficient documentation

## 2012-10-25 DIAGNOSIS — S0990XA Unspecified injury of head, initial encounter: Secondary | ICD-10-CM | POA: Insufficient documentation

## 2012-10-25 DIAGNOSIS — R42 Dizziness and giddiness: Secondary | ICD-10-CM | POA: Insufficient documentation

## 2012-10-25 DIAGNOSIS — R55 Syncope and collapse: Secondary | ICD-10-CM | POA: Insufficient documentation

## 2012-10-25 LAB — GLUCOSE, CAPILLARY: Glucose-Capillary: 94 mg/dL (ref 70–99)

## 2012-10-25 LAB — POCT PREGNANCY, URINE: Preg Test, Ur: NEGATIVE

## 2012-10-25 MED ORDER — BACITRACIN ZINC 500 UNIT/GM EX OINT
TOPICAL_OINTMENT | CUTANEOUS | Status: AC
  Administered 2012-10-25: 15:00:00
  Filled 2012-10-25: qty 0.9

## 2012-10-25 MED ORDER — IOHEXOL 300 MG/ML  SOLN
100.0000 mL | Freq: Once | INTRAMUSCULAR | Status: AC | PRN
  Administered 2012-10-25: 100 mL via INTRAVENOUS
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
## 2012-10-26 NOTE — ED Provider Notes (Signed)
Medical screening examination/treatment/procedure(s) were performed by non-physician practitioner and as supervising physician I was immediately available for consultation/collaboration.   Charles B. Sheldon, MD 10/26/12 1559 

## 2012-11-19 ENCOUNTER — Ambulatory Visit (INDEPENDENT_AMBULATORY_CARE_PROVIDER_SITE_OTHER): Payer: BC Managed Care – PPO | Admitting: Internal Medicine

## 2012-11-19 ENCOUNTER — Encounter (INDEPENDENT_AMBULATORY_CARE_PROVIDER_SITE_OTHER): Payer: Self-pay | Admitting: Internal Medicine

## 2012-11-19 VITALS — BP 128/84 | HR 108 | Temp 98.2°F | Ht 66.5 in | Wt 247.4 lb

## 2012-11-19 DIAGNOSIS — K5900 Constipation, unspecified: Secondary | ICD-10-CM

## 2012-11-19 MED ORDER — LINACLOTIDE 145 MCG PO CAPS: 145.0000 ug | ORAL_CAPSULE | Freq: Every day | ORAL | Status: DC

## 2012-11-19 NOTE — Progress Notes (Signed)
Subjective:     Patient ID: Sharon Vazquez, female   DOB: 17-Oct-1980, 32 y.o.   MRN: [id number]  HPI Here today for f/u of her constipation.  She is having a BM about every 3 days. Has ran out of Linzess.  Sometimes her stools are hard and small. She actually ran out of Linzess. She has tried Miralax and MOM, Colace, Metamucil in the past and it did not help.  There is no rectal bleeding or melena.  Last week she had an episode of diarrhea and wiped and she saw blood.  Appetite is okay. She eats once a day. She however has gained 15 pounds since March.  She is on Hydrocodone for a back injury and neck pain.   She is on multiple medications that are constipating. Biopsy:  Sessile serrated adenoma polyp with dysplasia. No high grade dysplasia or malignancy. Appreciable resection margin, negative for dysplasia or malignancy. 08/2012 Colonoscopy Dr. Karilyn Cota: Rt lower quadrant pain, Constipation: Normal terminal ileum.  Single diverticulum at cecum.  10 mm broad-based polyp snared from cecum.  No evidence of endoscopic colitis.  Small external hemorrhoids.   Review of Systems see hpi Current Outpatient Prescriptions  Medication Sig Dispense Refill  . albuterol (PROVENTIL HFA;VENTOLIN HFA) 108 (90 BASE) MCG/ACT inhaler Inhale 2 puffs into the lungs every 6 (six) hours as needed for wheezing or shortness of breath.      . alprazolam (XANAX) 2 MG tablet Take 2 mg by mouth 3 (three) times daily as needed for anxiety.       . ARIPiprazole (ABILIFY) 5 MG tablet Take 2.5 mg by mouth daily.      . cloNIDine (CATAPRES) 0.1 MG tablet Take 0.1 mg by mouth 2 (two) times daily.      . cyclobenzaprine (FLEXERIL) 10 MG tablet Take 10 mg by mouth 2 (two) times daily as needed for muscle spasms.      Marland Kitchen doxepin (SINEQUAN) 25 MG capsule Take 25 mg by mouth 2 (two) times daily.      Marland Kitchen HYDROcodone-acetaminophen (NORCO/VICODIN) 5-325 MG per tablet Take 1 tablet by mouth every 4 (four) hours as needed for pain.  12  tablet  0  . lurasidone (LATUDA) 80 MG TABS Take 40 mg by mouth at bedtime.       . medroxyPROGESTERone (DEPO-PROVERA) 150 MG/ML injection Inject 150 mg into the muscle every 3 (three) months.      . nystatin-triamcinolone (MYCOLOG II) cream Apply topically 2 (two) times daily. Apply cream to anal canal twice daily for 2 weeks and then as needed.  60 g  0  . topiramate (TOPAMAX) 25 MG tablet Take 25 mg by mouth daily.      Marland Kitchen triamcinolone (KENALOG) 0.025 % cream Apply topically as needed.       Marland Kitchen ketotifen (ZADITOR) 0.025 % ophthalmic solution Place 1 drop into both eyes daily as needed (Itchy Eyes).      . Linaclotide (LINZESS) 145 MCG CAPS Take 145 mcg by mouth daily.       No current facility-administered medications for this visit.   Past Medical History  Diagnosis Date  . Hypertension   . Anxiety   . Bipolar 1 disorder   . ADHD (attention deficit hyperactivity disorder)   . Borderline personality disorder   . Panic attack   . Headache(784.0)   . Panic disorder   . HSV-2 (herpes simplex virus 2) infection   . Hx of chlamydia infection   . IBS (irritable bowel  syndrome)    Allergies  Allergen Reactions  . Bee Venom Anaphylaxis  . Dilaudid (Hydromorphone Hcl) Anaphylaxis and Hives  . Peanut-Containing Drug Products Anaphylaxis  . Senna Anaphylaxis and Hives  . Adhesive (Tape) Rash        Objective:   Physical Exam   Filed Vitals:   11/19/12 0928  BP: 128/84  Pulse: 108  Temp: 98.2 F (36.8 C)  Height: 5' 6.5" (1.689 m)  Weight: 247 lb 6.4 oz (112.22 kg)  Alert and oriented. Skin warm and dry. Oral mucosa is moist.   . Sclera anicteric, conjunctivae is pink. Thyroid not enlarged. No cervical lymphadenopathy. Lungs clear. Heart regular rate and rhythm.  Abdomen is soft. Bowel sounds are positive. No hepatomegaly. No abdominal masses felt. No tenderness.  No edema to lower extremities.         Assessment:     IBS-Constipation. She is doing better with the LInzess.    She is on multiple medications that are constipating.    Plan:    Linzess # 5boxes given to patient. Rx for Linzess to her drug store

## 2012-11-19 NOTE — Patient Instructions (Addendum)
Samples of Linzess x 6 boxes. Rx for same to [facility] pharmacy. OV in 5 months.

## 2012-11-20 ENCOUNTER — Ambulatory Visit: Payer: BC Managed Care – PPO | Attending: Neurology | Admitting: Sleep Medicine

## 2012-11-20 ENCOUNTER — Ambulatory Visit (INDEPENDENT_AMBULATORY_CARE_PROVIDER_SITE_OTHER): Payer: BC Managed Care – PPO | Admitting: Adult Health

## 2012-11-20 ENCOUNTER — Other Ambulatory Visit: Payer: Self-pay

## 2012-11-20 ENCOUNTER — Encounter: Payer: Self-pay | Admitting: Adult Health

## 2012-11-20 VITALS — BP 128/88 | Ht 66.5 in | Wt 249.0 lb

## 2012-11-20 VITALS — Ht 66.5 in | Wt 248.0 lb

## 2012-11-20 DIAGNOSIS — Z79899 Other long term (current) drug therapy: Secondary | ICD-10-CM | POA: Insufficient documentation

## 2012-11-20 DIAGNOSIS — Z309 Encounter for contraceptive management, unspecified: Secondary | ICD-10-CM

## 2012-11-20 DIAGNOSIS — R0609 Other forms of dyspnea: Secondary | ICD-10-CM | POA: Insufficient documentation

## 2012-11-20 DIAGNOSIS — Z3049 Encounter for surveillance of other contraceptives: Secondary | ICD-10-CM

## 2012-11-20 DIAGNOSIS — G4730 Sleep apnea, unspecified: Secondary | ICD-10-CM

## 2012-11-20 DIAGNOSIS — Z8679 Personal history of other diseases of the circulatory system: Secondary | ICD-10-CM

## 2012-11-20 DIAGNOSIS — Z3202 Encounter for pregnancy test, result negative: Secondary | ICD-10-CM

## 2012-11-20 DIAGNOSIS — G479 Sleep disorder, unspecified: Secondary | ICD-10-CM | POA: Insufficient documentation

## 2012-11-20 DIAGNOSIS — Z3200 Encounter for pregnancy test, result unknown: Secondary | ICD-10-CM

## 2012-11-20 DIAGNOSIS — R5381 Other malaise: Secondary | ICD-10-CM | POA: Insufficient documentation

## 2012-11-20 DIAGNOSIS — R0989 Other specified symptoms and signs involving the circulatory and respiratory systems: Secondary | ICD-10-CM | POA: Insufficient documentation

## 2012-11-20 LAB — POCT URINE PREGNANCY: Preg Test, Ur: NEGATIVE

## 2012-11-20 MED ORDER — NORETHINDRONE 0.35 MG PO TABS: 1.0000 | ORAL_TABLET | Freq: Every day | ORAL | Status: DC
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
## 2012-11-20 NOTE — Patient Instructions (Addendum)
Start micronor Sunday and use condoms take same every day Follow up in 3 months

## 2012-11-30 NOTE — Procedures (Signed)
[facility] Kal Chait A. Gerilyn Pilgrim, MD     [email or web address]        NAME:  Sharon Vazquez, Sharon Vazquez           ACCOUNT NO.:  [id number]  MEDICAL RECORD NO.:  [id number]          PATIENT TYPE:  OUT  LOCATION:  SLEEP LAB                     FACILITY:  [facility]  PHYSICIAN:  Galen Malkowski A. Gerilyn Vazquez, M.D. DATE OF BIRTH:  1981-01-07  DATE OF STUDY:  11/20/2012                           NOCTURNAL POLYSOMNOGRAM  REFERRING PHYSICIAN:  Yonna Alwin A. Gerilyn Vazquez, M.D.  INDICATIONS:  A 32 year old who presents with fatigue, snoring, and difficulty sleeping.   MEDICATIONS:  Abilify, Latuda, clonidine, doxepin, alprazolam, Depo- Provera, hydrocodone, cyclobenzaprine.  EPWORTH SLEEPINESS SCALE:  6.  BMI:  39.  ARCHITECTURAL SUMMARY:  The total recording time is 454 minutes.  Sleep efficiency 89%.  Sleep latency minutes.  REM latency 190 minutes.  Stage N1 is 5%, N2 is 56%, N3 is 27% and REM sleep 11%.  RESPIRATORY DATA:  Baseline oxygen saturation is 99, lowest saturation 94 during REM sleep.  Diagnostic AHI is 2 and RDI also 2.  LIMB MOVEMENT SUMMARY:  PLM index 0.  ELECTROCARDIOGRAM SUMMARY:  Average heart rate is 103 with no significant dysrhythmias observed.  IMPRESSION:  Unremarkable nocturnal polysomnography.    Sharon Vazquez, M.D.    KAD/MEDQ  D:  11/30/2012 08:46:15  T:  11/30/2012 09:02:32  Job:  [id number]

## 2012-12-04 ENCOUNTER — Telehealth: Payer: Self-pay | Admitting: Adult Health

## 2012-12-04 NOTE — Telephone Encounter (Signed)
Pt stated was started on BCP by Cyril Mourning, NP and told to take at the same time everyday. Pt states will need to take BCP 30 minutes earlier tomorrow due to court date. Pt informed ok to take at that time. Pt verbalized understanding.

## 2012-12-06 ENCOUNTER — Ambulatory Visit: Payer: BC Managed Care – PPO

## 2012-12-06 ENCOUNTER — Ambulatory Visit: Payer: BC Managed Care – PPO | Admitting: Adult Health

## 2012-12-14 ENCOUNTER — Telehealth: Payer: Self-pay | Admitting: *Deleted

## 2012-12-14 NOTE — Telephone Encounter (Signed)
Spoke with pt. On Micronor. Took 3 pills today. On last week of pills. Starts new pack on 12/23/12. 6 pills are left in pack. Spoke with Cyril Mourning, NP. She advised it should not hurt her but she will run out early. Having backpain from period. Does she need to just start back on Monday? Spoke with JAG. Continue taking pills and use backup. Pt voiced understanding. JSY

## 2012-12-17 ENCOUNTER — Encounter (INDEPENDENT_AMBULATORY_CARE_PROVIDER_SITE_OTHER): Payer: Self-pay | Admitting: Internal Medicine

## 2012-12-17 ENCOUNTER — Ambulatory Visit (INDEPENDENT_AMBULATORY_CARE_PROVIDER_SITE_OTHER): Payer: BC Managed Care – PPO | Admitting: Internal Medicine

## 2012-12-17 VITALS — BP 114/84 | HR 110 | Temp 98.3°F | Resp 20 | Ht 66.5 in | Wt 245.3 lb

## 2012-12-17 DIAGNOSIS — R1084 Generalized abdominal pain: Secondary | ICD-10-CM

## 2012-12-17 DIAGNOSIS — G8929 Other chronic pain: Secondary | ICD-10-CM

## 2012-12-17 DIAGNOSIS — K5900 Constipation, unspecified: Secondary | ICD-10-CM

## 2012-12-17 DIAGNOSIS — K5909 Other constipation: Secondary | ICD-10-CM | POA: Insufficient documentation

## 2012-12-17 MED ORDER — LINACLOTIDE 290 MCG PO CAPS: 290.0000 ug | ORAL_CAPSULE | Freq: Every day | ORAL | Status: DC
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
## 2012-12-17 NOTE — Patient Instructions (Signed)
Must eat fiber rich foods at each meal. Keep food and stool diary as directed. Can use fleets enema every third day on as-needed basis.

## 2012-12-21 ENCOUNTER — Telehealth (INDEPENDENT_AMBULATORY_CARE_PROVIDER_SITE_OTHER): Payer: Self-pay | Admitting: Internal Medicine

## 2012-12-21 NOTE — Telephone Encounter (Signed)
Sharon Vazquez called this morning. She was crying. She tells me she has not had a BM since the end of June. She did have a very small BM this am which she describes as 2 balls. She has tried enemas, Mag Citrate, Linzess without any relief. She says her stomach is cramping.  She is on several medications that are constipating.  I did advise her that I thought she needed an enema. I advised her to go to the ED. We do not have enemas in our office to help her.

## 2013-01-22 ENCOUNTER — Encounter: Payer: Self-pay | Admitting: Adult Health

## 2013-01-22 ENCOUNTER — Ambulatory Visit (INDEPENDENT_AMBULATORY_CARE_PROVIDER_SITE_OTHER): Payer: BC Managed Care – PPO

## 2013-01-22 ENCOUNTER — Ambulatory Visit (INDEPENDENT_AMBULATORY_CARE_PROVIDER_SITE_OTHER): Payer: BC Managed Care – PPO | Admitting: Adult Health

## 2013-01-22 ENCOUNTER — Telehealth: Payer: Self-pay | Admitting: Adult Health

## 2013-01-22 VITALS — BP 120/80 | Ht 66.5 in | Wt 242.0 lb

## 2013-01-22 DIAGNOSIS — N949 Unspecified condition associated with female genital organs and menstrual cycle: Secondary | ICD-10-CM

## 2013-01-22 DIAGNOSIS — Z3202 Encounter for pregnancy test, result negative: Secondary | ICD-10-CM

## 2013-01-22 DIAGNOSIS — N83201 Unspecified ovarian cyst, right side: Secondary | ICD-10-CM | POA: Insufficient documentation

## 2013-01-22 DIAGNOSIS — N83209 Unspecified ovarian cyst, unspecified side: Secondary | ICD-10-CM

## 2013-01-22 DIAGNOSIS — Z3200 Encounter for pregnancy test, result unknown: Secondary | ICD-10-CM

## 2013-01-22 MED ORDER — HYDROCODONE-IBUPROFEN 7.5-200 MG PO TABS: 1.0000 | ORAL_TABLET | Freq: Three times a day (TID) | ORAL | Status: DC | PRN

## 2013-01-22 NOTE — Patient Instructions (Addendum)
Ovarian Cyst The ovaries are small organs that are on each side of the uterus. The ovaries are the organs that produce the female hormones, estrogen and progesterone. An ovarian cyst is a sac filled with fluid that can vary in its size. It is normal for a small cyst to form in women who are in the childbearing age and who have menstrual periods. This type of cyst is called a follicle cyst that becomes an ovulation cyst (corpus luteum cyst) after it produces the women's egg. It later goes away on its own if the woman does not become pregnant. There are other kinds of ovarian cysts that may cause problems and may need to be treated. The most serious problem is a cyst with cancer. It should be noted that menopausal women who have an ovarian cyst are at a higher risk of it being a cancer cyst. They should be evaluated very quickly, thoroughly and followed closely. This is especially true in menopausal women because of the high rate of ovarian cancer in women in menopause. CAUSES AND TYPES OF OVARIAN CYSTS:  FUNCTIONAL CYST: The follicle/corpus luteum cyst is a functional cyst that occurs every month during ovulation with the menstrual cycle. They go away with the next menstrual cycle if the woman does not get pregnant. Usually, there are no symptoms with a functional cyst.  ENDOMETRIOMA CYST: This cyst develops from the lining of the uterus tissue. This cyst gets in or on the ovary. It grows every month from the bleeding during the menstrual period. It is also called a "chocolate cyst" because it becomes filled with blood that turns brown. This cyst can cause pain in the lower abdomen during intercourse and with your menstrual period.  CYSTADENOMA CYST: This cyst develops from the cells on the outside of the ovary. They usually are not cancerous. They can get very big and cause lower abdomen pain and pain with intercourse. This type of cyst can twist on itself, cut off its blood supply and cause severe pain. It  also can easily rupture and cause a lot of pain.  DERMOID CYST: This type of cyst is sometimes found in both ovaries. They are found to have different kinds of body tissue in the cyst. The tissue includes skin, teeth, hair, and/or cartilage. They usually do not have symptoms unless they get very big. Dermoid cysts are rarely cancerous.  POLYCYSTIC OVARY: This is a rare condition with hormone problems that produces many small cysts on both ovaries. The cysts are follicle-like cysts that never produce an egg and become a corpus luteum. It can cause an increase in body weight, infertility, acne, increase in body and facial hair and lack of menstrual periods or rare menstrual periods. Many women with this problem develop type 2 diabetes. The exact cause of this problem is unknown. A polycystic ovary is rarely cancerous.  THECA LUTEIN CYST: Occurs when too much hormone (human chorionic gonadotropin) is produced and over-stimulates the ovaries to produce an egg. They are frequently seen when doctors stimulate the ovaries for invitro-fertilization (test tube babies).  LUTEOMA CYST: This cyst is seen during pregnancy. Rarely it can cause an obstruction to the birth canal during labor and delivery. They usually go away after delivery. SYMPTOMS   Pelvic pain or pressure.  Pain during sexual intercourse.  Increasing girth (swelling) of the abdomen.  Abnormal menstrual periods.  Increasing pain with menstrual periods.  You stop having menstrual periods and you are not pregnant. DIAGNOSIS  The diagnosis can   be made during:  Routine or annual pelvic examination (common).  Ultrasound.  X-ray of the pelvis.  CT Scan.  MRI.  Blood tests. TREATMENT   Treatment may only be to follow the cyst monthly for 2 to 3 months with your caregiver. Many go away on their own, especially functional cysts.  May be aspirated (drained) with a long needle with ultrasound, or by laparoscopy (inserting a tube into  the pelvis through a small incision).  The whole cyst can be removed by laparoscopy.  Sometimes the cyst may need to be removed through an incision in the lower abdomen.  Hormone treatment is sometimes used to help dissolve certain cysts.  Birth control pills are sometimes used to help dissolve certain cysts. HOME CARE INSTRUCTIONS  Follow your caregiver's advice regarding:  Medicine.  Follow up visits to evaluate and treat the cyst.  You may need to come back or make an appointment with another caregiver, to find the exact cause of your cyst, if your caregiver is not a gynecologist.  Get your yearly and recommended pelvic examinations and Pap tests.  Let your caregiver know if you have had an ovarian cyst in the past. SEEK MEDICAL CARE IF:   Your periods are late, irregular, they stop, or are painful.  Your stomach (abdomen) or pelvic pain does not go away.  Your stomach becomes larger or swollen.  You have pressure on your bladder or trouble emptying your bladder completely.  You have painful sexual intercourse.  You have feelings of fullness, pressure, or discomfort in your stomach.  You lose weight for no apparent reason.  You feel generally ill.  You become constipated.  You lose your appetite.  You develop acne.  You have an increase in body and facial hair.  You are gaining weight, without changing your exercise and eating habits.  You think you are pregnant. SEEK IMMEDIATE MEDICAL CARE IF:   You have increasing abdominal pain.  You feel sick to your stomach (nausea) and/or vomit.  You develop a fever that comes on suddenly.  You develop abdominal pain during a bowel movement.  Your menstrual periods become heavier than usual. Document Released: 05/23/2005 Document Revised: 08/15/2011 Document Reviewed: 03/26/2009 [facility] Patient Information 2014 [place], [place]. Push fluids Try vicoprofen Return in 2 weeks

## 2013-01-22 NOTE — Progress Notes (Signed)
Subjective:     Patient ID: Sharon Vazquez, female   DOB: 11/01/1980, 32 y.o.   MRN: [id number]  HPI Sharon Vazquez is a 32 year old black female in complaining of bleeding since Friday ,cramps bloated and back ache.She is teary and looks in discomfort.  Review of Systems Positives in HPI Reviewed past medical,surgical, social and family history. Reviewed medications and allergies.     Objective:   Physical Exam BP 120/80  Ht 5' 6.5" (1.689 m)  Wt 242 lb (109.77 kg)  BMI 38.48 kg/m2  LMP 01/18/2013   Urine pregnancy test negative. Skin warm and dry.Pelvic: external genitalia is normal in appearance, vagina:period type blood, cervix:smooth and bulbous,- CMT uterus: normal size, shape and contour, non tender, no masses felt, adnexa: no masses, there is  tenderness noted RLQ and over bladder area, US performed +right ovarian 3.1 cm cyst with good blood flow seen.  Assessment:     Right ovarian cyst    Plan:    Push fluids  Take Micronor daily Rx vico profen #30 with 1 refill 1 every 8 hours prn pain   Follow up in 2 weeks

## 2013-01-22 NOTE — Telephone Encounter (Signed)
Pt states having severe cramps and heavy periods since she stopped depo. Pt states midol and hydrocodone not helping. Requesting other pain medications to be prescribed. Call transferred to front staff for an appt to be made.

## 2013-02-05 ENCOUNTER — Ambulatory Visit (INDEPENDENT_AMBULATORY_CARE_PROVIDER_SITE_OTHER): Payer: BC Managed Care – PPO | Admitting: Adult Health

## 2013-02-05 ENCOUNTER — Encounter: Payer: Self-pay | Admitting: Adult Health

## 2013-02-05 VITALS — BP 120/80 | Ht 67.0 in | Wt 249.0 lb

## 2013-02-05 DIAGNOSIS — N83209 Unspecified ovarian cyst, unspecified side: Secondary | ICD-10-CM

## 2013-02-05 DIAGNOSIS — N83201 Unspecified ovarian cyst, right side: Secondary | ICD-10-CM

## 2013-02-05 DIAGNOSIS — N949 Unspecified condition associated with female genital organs and menstrual cycle: Secondary | ICD-10-CM

## 2013-02-05 NOTE — Progress Notes (Signed)
Subjective:     Patient ID: Sharon Vazquez, female   DOB: [birth year], 32 y.o.   MRN: [id number]  HPI Sharon Vazquez is back in complaining of low pelvic pain.She was seen at urgent care in [place] and given Toradol and that helped some.She has a 3 cm right ovarian cyst seen on [place] and has had back problems and had MRI Saturday.She says she is taking Micronor, but she still would like a child.  Review of Systems Positives in HPI Reviewed past medical,surgical, social and family history. Reviewed medications and allergies.     Objective:   Physical Exam BP 120/80  Ht 5\' 7"  (1.702 m)  Wt 249 lb (112.946 kg)  BMI 38.99 kg/m2  LMP 01/18/2013   She has tenderness RLQ and LLQ, no rebound and no increase pain with leg raises.  Assessment:     Right ovarian cyst Pelvic pain    Plan:     Return 9/3 at 1:30 pm to see Dr Despina Hidden about possible removal of cyst Review handout on pelvic pain

## 2013-02-05 NOTE — Patient Instructions (Addendum)
See Dr Despina Hidden 9/3 at 1:30 pm Pelvic Pain Pelvic pain is pain below the belly button and located between your hips. Acute pain may last a few hours or days. Chronic pelvic pain may last weeks and months. The cause may be different for different types of pain. The pain may be dull or sharp, mild or severe and can interfere with your daily activities. Write down and tell your caregiver:   Exactly where the pain is located.  If it comes and goes or is there all the time.  When it happens (with sex, urination, bowel movement, etc.)  If the pain is related to your menstrual period or stress. Your caregiver will take a full history and do a complete physical exam and Pap test. CAUSES   Painful menstrual periods (dysmenorrhea).  Normal ovulation (Mittelschmertz) that occurs in the middle of the menstrual cycle every month.  The pelvic organs get engorged with blood just before the menstrual period (pelvic congestive syndrome).  Scar tissue from an infection or past surgery (pelvic adhesions).  Cancer of the female pelvic organs. When there is pain with cancer, it has been there for a long time.  The lining of the uterus (endometrium) abnormally grows in places like the pelvis and on the pelvic organs (endometriosis).  A form of endometriosis with the lining of the uterus present inside of the muscle tissue of the uterus (adenomyosis).  Fibroid tumor (noncancerous) in the uterus.  Bladder problems such as infection, bladder spasms of the muscle tissue of the bladder.  Intestinal problems (irritable bowel syndrome, colitis, an ulcer or gastrointestinal infection).  Polyps of the cervix or uterus.  Pregnancy in the tube (ectopic pregnancy).  The opening of the cervix is too small for the menstrual blood to flow through it (cervical stenosis).  Physical or sexual abuse (past or present).  Musculo-skeletal problems from poor posture, problems with the vertebrae of the lower back or the  uterine pelvic muscles falling (prolapse).  Psychological problems such as depression or stress.  IUD (intrauterine device) in the uterus. DIAGNOSIS  Tests to make a diagnosis depends on the type, location, severity and what causes the pain to occur. Tests that may be needed include:  Blood tests.  Urine tests  Ultrasound.  X-rays.  CT Scan.  MRI.  Laparoscopy.  Major surgery. TREATMENT  Treatment will depend on the cause of the pain, which includes:  Prescription or over-the-counter pain medication.  Antibiotics.  Birth control pills.  Hormone treatment.  Nerve blocking injections.  Physical therapy.  Antidepressants.  Counseling with a psychiatrist or psychologist.  Minor or major surgery. HOME CARE INSTRUCTIONS   Only take over-the-counter or prescription medicines for pain, discomfort or fever as directed by your caregiver.  Follow your caregiver's advice to treat your pain.  Rest.  Avoid sexual intercourse if it causes the pain.  Apply warm or cold compresses (which ever works best) to the pain area.  Do relaxation exercises such as yoga or meditation.  Try acupuncture.  Avoid stressful situations.  Try group therapy.  If the pain is because of a stomach/intestinal upset, drink clear liquids, eat a bland light food diet until the symptoms go away. SEEK MEDICAL CARE IF:   You need stronger prescription pain medication.  You develop pain with sexual intercourse.  You have pain with urination.  You develop a temperature of 102 F (38.9 C) with the pain.  You are still in pain after 4 hours of taking prescription medication for the  pain.  You need depression medication.  Your IUD is causing pain and you want it removed. SEEK IMMEDIATE MEDICAL CARE IF:  You develop very severe pain or tenderness.  You faint, have chills, severe weakness or dehydration.  You develop heavy vaginal bleeding or passing solid tissue.  You develop a  temperature of 102 F (38.9 C) with the pain.  You have blood in the urine.  You are being physically or sexually abused.  You have uncontrolled vomiting and diarrhea.  You are depressed and afraid of harming yourself or someone else. Document Released: 06/30/2004 Document Revised: 08/15/2011 Document Reviewed: 03/27/2008 [facility] Patient Information 2013 [place], [place].

## 2013-02-06 ENCOUNTER — Ambulatory Visit (INDEPENDENT_AMBULATORY_CARE_PROVIDER_SITE_OTHER): Payer: BC Managed Care – PPO | Admitting: Obstetrics & Gynecology

## 2013-02-06 ENCOUNTER — Encounter: Payer: Self-pay | Admitting: Obstetrics & Gynecology

## 2013-02-06 ENCOUNTER — Telehealth (INDEPENDENT_AMBULATORY_CARE_PROVIDER_SITE_OTHER): Payer: Self-pay | Admitting: *Deleted

## 2013-02-06 VITALS — BP 130/80 | Wt 249.0 lb

## 2013-02-06 DIAGNOSIS — N83209 Unspecified ovarian cyst, unspecified side: Secondary | ICD-10-CM

## 2013-02-06 NOTE — Telephone Encounter (Signed)
Patient was seen in the office on 12/17/12. These are Dr.Rehman's notes from that visit.#1. Chronic constipation made worse with some of her medications. Given that she has abdominal pain she could be considered having IBS predominant constipation. However her pain does not go away with defecation. Therefore I feel that these two are separate issues. She had abdominopelvic CT about 6 weeks ago and no abnormality was noted. I do not believe she needs further workup at this time.  #2. History of colonic adenoma. She had 10 mm serrated adenoma removed from her cecum in March 2014. She will be undergoing another colonoscopy in March 2017.     I will forward this to Dr.Rehman to address then the patient will be called with his recommendations. It also looks as if the patient after seen here was followed by Dr. Emelda Fear for Ovarian Cyst.

## 2013-02-06 NOTE — Telephone Encounter (Signed)
Sharon Vazquez is having a lot of left abd pain. She had a bowel movement on 01/20/13 then 02/05/13. She is taking Linzess, Murelax and Milk of Magnesia. Her next apt is 02/18/13 with Dr. Karilyn Cota. Please advised what she can do. The return phone number is [phone].

## 2013-02-06 NOTE — Progress Notes (Signed)
Patient ID: KOOPER CHRISWELL, female   DOB: Oct 31, 1980, 32 y.o.   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 The patient is benign depth over the past for 13 years and has had no periods at all She will been due to get her last episode to 3 months ago  Stay she is continuing to have right lower quadrant pain Somewhat complicated by the fact that she has some bulging disc issues as well  Nonetheless I've told her that time we certainly would not entertain the possibility of surgical management for a 3 cm cyst She is told to stay on her Micronor for suppression I will see her after she has her next cycle around October 1 At that time she should see an improvement in her pain and we will ultrasound her at that time to evaluate the ovarian cyst

## 2013-02-07 ENCOUNTER — Ambulatory Visit: Payer: BC Managed Care – PPO | Admitting: Obstetrics & Gynecology

## 2013-02-07 NOTE — Telephone Encounter (Signed)
She can use fleet enemas every other day as needed. She must take fiber supplement daily.

## 2013-02-07 NOTE — Telephone Encounter (Signed)
Patient called and she states that on 02/06/13 she had a good bowel movement, at first it was pretty hard but then okay . This was after she used MOM. She is feeling better not as bloated.  She also states that she has been using Fleet Enemas and she is eating a high fiber diet. Patient will call me after her next bowel movement with a progress report.

## 2013-02-18 ENCOUNTER — Ambulatory Visit (INDEPENDENT_AMBULATORY_CARE_PROVIDER_SITE_OTHER): Payer: BC Managed Care – PPO | Admitting: Internal Medicine

## 2013-02-18 ENCOUNTER — Encounter (INDEPENDENT_AMBULATORY_CARE_PROVIDER_SITE_OTHER): Payer: Self-pay | Admitting: Internal Medicine

## 2013-02-18 VITALS — BP 112/78 | HR 86 | Temp 98.4°F | Resp 18 | Ht 66.5 in | Wt 241.3 lb

## 2013-02-18 DIAGNOSIS — R1032 Left lower quadrant pain: Secondary | ICD-10-CM

## 2013-02-18 DIAGNOSIS — K5900 Constipation, unspecified: Secondary | ICD-10-CM

## 2013-02-18 DIAGNOSIS — K5909 Other constipation: Secondary | ICD-10-CM

## 2013-02-18 MED ORDER — MAGNESIUM HYDROXIDE 400 MG/5ML PO SUSP: 30.0000 mL | ORAL | Status: DC | PRN

## 2013-02-18 NOTE — Patient Instructions (Signed)
Can take 2 tablespoonful or 30 mL of milk of Magnesia every third day as needed for constipation. Physician will contact you with result of CT scan
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
## 2013-02-20 ENCOUNTER — Ambulatory Visit: Payer: BC Managed Care – PPO | Admitting: Adult Health

## 2013-02-22 ENCOUNTER — Ambulatory Visit (HOSPITAL_COMMUNITY)
Admission: RE | Admit: 2013-02-22 | Discharge: 2013-02-22 | Disposition: A | Discharge status: Home / Self Care | Payer: BC Managed Care – PPO | Source: Ambulatory Visit | Attending: Internal Medicine | Admitting: Internal Medicine

## 2013-02-22 DIAGNOSIS — R9389 Abnormal findings on diagnostic imaging of other specified body structures: Secondary | ICD-10-CM | POA: Insufficient documentation

## 2013-02-22 DIAGNOSIS — R1032 Left lower quadrant pain: Secondary | ICD-10-CM | POA: Insufficient documentation

## 2013-02-22 MED ORDER — IOHEXOL 300 MG/ML  SOLN
100.0000 mL | Freq: Once | INTRAMUSCULAR | Status: AC | PRN
  Administered 2013-02-22: 100 mL via INTRAVENOUS

## 2013-03-07 ENCOUNTER — Other Ambulatory Visit: Payer: BC Managed Care – PPO

## 2013-03-07 ENCOUNTER — Ambulatory Visit: Payer: BC Managed Care – PPO | Admitting: Obstetrics and Gynecology

## 2013-03-07 ENCOUNTER — Ambulatory Visit: Payer: BC Managed Care – PPO | Admitting: Obstetrics & Gynecology

## 2013-03-08 ENCOUNTER — Ambulatory Visit (INDEPENDENT_AMBULATORY_CARE_PROVIDER_SITE_OTHER): Payer: BC Managed Care – PPO | Admitting: Obstetrics and Gynecology

## 2013-03-08 ENCOUNTER — Other Ambulatory Visit: Payer: Self-pay | Admitting: Obstetrics & Gynecology

## 2013-03-08 ENCOUNTER — Ambulatory Visit (INDEPENDENT_AMBULATORY_CARE_PROVIDER_SITE_OTHER): Payer: BC Managed Care – PPO

## 2013-03-08 VITALS — BP 120/82 | Ht 66.0 in | Wt 240.0 lb

## 2013-03-08 DIAGNOSIS — N83209 Unspecified ovarian cyst, unspecified side: Secondary | ICD-10-CM

## 2013-03-08 DIAGNOSIS — Z8742 Personal history of other diseases of the female genital tract: Secondary | ICD-10-CM

## 2013-03-08 DIAGNOSIS — N949 Unspecified condition associated with female genital organs and menstrual cycle: Secondary | ICD-10-CM

## 2013-03-08 MED ORDER — HYDROCODONE-IBUPROFEN 7.5-200 MG PO TABS: 1.0000 | ORAL_TABLET | Freq: Three times a day (TID) | ORAL | Status: DC | PRN

## 2013-03-08 NOTE — Progress Notes (Signed)
Patient ID: Sharon Vazquez, female   DOB: 1980/11/12, 32 y.o.   MRN: [id number] Pt here for [place] and then results.   [facility] Visit  Patient name: Sharon Vazquez MRN [id number]  Date of birth: Aug 27, 1980  CC & HPI:  Sharon Vazquez is a 32 y.o. female presenting today for  followup of right ov cyst that was 3 cm in July, pt c/o chronic pain in  Lower abd as well as back pain going down into LEFT leg c/w sciatica. Being seen by Dr Margreta Journey of [facility] for back pain , plus chiropractor here in [place].  pt reports she has a 'pinched nerve in 'back on left.  ROS:  U/s shows  Resolution of ov cyst on rt. Pt c/o residual discomfort. Pain is  On left back worse than rlq pain.Marland Kitchen   Pertinent History Reviewed:  Medical & Surgical Hx:  Reviewed: Significant for pinched nerve in back on left Medications: Reviewed & Updated - see associated section Social History: Reviewed -  reports that she quit smoking about 5 months ago. Her smoking use included Cigarettes. She smoked 0.00 packs per day. She has never used smokeless tobacco.  Objective Findings:  Vitals: BP 120/82  Ht 5\' 6"  (1.676 m)  Wt 240 lb (108.863 kg)  BMI 38.76 kg/m2  LMP 02/28/2013  Physical Examination: Pt reassured by normalcy of u/s  Contraception by Herbert Seta., breakthru bleeding. Assessment & Plan:   Resolved ov cyst. Some mild rlq pain, pt reassured. Contraception by Progestinonly pill. Chronic LLq pain for left sides backache., due to Back pain/pinched nerve.

## 2013-03-08 NOTE — Patient Instructions (Signed)
Return as needed if pain becomes worse There are no cysts seen at this time.

## 2013-03-21 ENCOUNTER — Telehealth: Payer: Self-pay | Admitting: Obstetrics and Gynecology

## 2013-03-21 NOTE — Telephone Encounter (Signed)
Spoke with pt. She is requesting a refill on Vicoprofen. Will you refill this? Thanks!!!

## 2013-03-26 ENCOUNTER — Other Ambulatory Visit: Payer: Self-pay | Admitting: Obstetrics and Gynecology

## 2013-03-26 ENCOUNTER — Telehealth: Payer: Self-pay | Admitting: Obstetrics and Gynecology

## 2013-03-26 MED ORDER — HYDROCODONE-IBUPROFEN 7.5-200 MG PO TABS: 1.0000 | ORAL_TABLET | Freq: Three times a day (TID) | ORAL | Status: DC | PRN

## 2013-03-26 NOTE — Telephone Encounter (Signed)
Rx filled, will need pt to pick up

## 2013-03-27 NOTE — Telephone Encounter (Signed)
LEFT MESSAGE FOR PT TO CALL BACK.

## 2013-03-27 NOTE — Telephone Encounter (Signed)
She will pick the rx up per Dr. Emelda Fear

## 2013-04-01 NOTE — Telephone Encounter (Signed)
Attempted to contact pt to verify pt received RX for Vicoprofen, left message.

## 2013-04-30 ENCOUNTER — Ambulatory Visit (INDEPENDENT_AMBULATORY_CARE_PROVIDER_SITE_OTHER): Payer: BC Managed Care – PPO | Admitting: Internal Medicine

## 2013-04-30 ENCOUNTER — Encounter (INDEPENDENT_AMBULATORY_CARE_PROVIDER_SITE_OTHER): Payer: Self-pay | Admitting: Internal Medicine

## 2013-04-30 VITALS — BP 118/70 | HR 76 | Temp 98.6°F | Resp 18 | Ht 66.5 in | Wt 244.1 lb

## 2013-04-30 DIAGNOSIS — K5909 Other constipation: Secondary | ICD-10-CM

## 2013-04-30 DIAGNOSIS — K5900 Constipation, unspecified: Secondary | ICD-10-CM

## 2013-04-30 MED ORDER — BISACODYL 10 MG RE SUPP: 10.0000 mg | Freq: Every day | RECTAL | Status: DC | PRN

## 2013-04-30 MED ORDER — LACTULOSE 20 GM/30ML PO SOLN: 30.0000 mL | Freq: Two times a day (BID) | ORAL | Status: DC

## 2013-04-30 NOTE — Progress Notes (Signed)
Presenting complaint;  Follow for chronic constipation.  Subjective:  Patient is 32 year old Caucasian female who presents for scheduled visit.Marland Kitchen she has chronic constipation and was last seen on 02/18/2013. She reports no improvement in her bowel habits. She has one or 2 bowel movements per week. Most of her stools or unformed loose or watery. She does not feel Linzess is working. She continues to complain of abdominal cramps bloating and urgency and sometimes she goes to the bathroom and passes flatus or nothing. She does not understand why she has gained 3 pounds as she only eats one meal a day but she does eat oatmeal and prunes every day.  Current Medications: Current Outpatient Prescriptions  Medication Sig Dispense Refill  . albuterol (PROVENTIL HFA;VENTOLIN HFA) 108 (90 BASE) MCG/ACT inhaler Inhale 2 puffs into the lungs every 6 (six) hours as needed for wheezing or shortness of breath.      . alprazolam (XANAX) 2 MG tablet Take 2 mg by mouth 3 (three) times daily as needed for anxiety.       . ARIPiprazole (ABILIFY) 5 MG tablet Take 2.5 mg by mouth daily.      . beclomethasone (QVAR) 80 MCG/ACT inhaler Inhale 2 puffs into the lungs 2 (two) times daily.       . cetirizine (ZYRTEC) 10 MG tablet Take 10 mg by mouth daily.      . cloNIDine (CATAPRES) 0.1 MG tablet Take 0.1 mg by mouth 2 (two) times daily.      . cyclobenzaprine (FLEXERIL) 10 MG tablet Take 10 mg by mouth 2 (two) times daily as needed for muscle spasms.      Marland Kitchen doxepin (SINEQUAN) 25 MG capsule Take 50 mg by mouth 2 (two) times daily.       . fexofenadine (ALLEGRA) 180 MG tablet Take 180 mg by mouth as needed.       . fluticasone (FLONASE) 50 MCG/ACT nasal spray Place 1 spray into the nose daily.      Marland Kitchen HYDROcodone-ibuprofen (VICOPROFEN) 7.5-200 MG per tablet Take 1 tablet by mouth every 8 (eight) hours as needed for pain.  30 tablet  0  . Linaclotide 290 MCG CAPS Take 290 mcg by mouth daily before breakfast.  30 capsule  5   . magnesium hydroxide (MILK OF MAGNESIA) 400 MG/5ML suspension Take 30 mLs by mouth every 3 (three) days as needed for constipation.  30 mL  0  . norethindrone (MICRONOR,CAMILA,ERRIN) 0.35 MG tablet Take 1 tablet (0.35 mg total) by mouth daily.  1 Package  11  . nystatin-triamcinolone (MYCOLOG II) cream Apply topically 2 (two) times daily. Apply cream to anal canal twice daily for 2 weeks and then as needed.  60 g  0  . Olopatadine HCl (PATADAY) 0.2 % SOLN Apply 1 drop to eye. In each eye both eyes daily.      . propranolol (INDERAL) 10 MG tablet Take 10 mg by mouth 3 (three) times daily. Patient takes this 3 times a day as tolerated      . topiramate (TOPAMAX) 25 MG tablet Take by mouth daily. Patient states that she takes 25 mg in the morning and 50 mg at night.      . zolpidem (AMBIEN) 10 MG tablet Take 10 mg by mouth at bedtime as needed for sleep.       No current facility-administered medications for this visit.     Objective: Blood pressure 118/70, pulse 76, temperature 98.6 F (37 C), temperature source Oral, resp. rate  18, height 5' 6.5" (1.689 m), weight 244 lb 1.6 oz (110.723 kg), last menstrual period 04/22/2013. Patient is alert and in no acute distress. Conjunctiva is pink. Sclera is nonicteric Oropharyngeal mucosa is normal. No neck masses or thyromegaly noted. Cardiac exam with regular rhythm normal S1 and S2. No murmur or gallop noted. Lungs are clear to auscultation. Abdomen i.s protuberant. Bowel sounds are normal. On palpation abdomen is soft and mild generalized tenderness without guarding. No organomegaly or masses No LE edema or clubbing noted.    Assessment:  #1. Chronic constipation made worse with her medications. She has not responded to Linzess. It is possible that she also has IBS or constipation predominant IBS.    Plan:  Discontinue Linzess. Continue high fiber diet. Lactulose 30 mL by mouth twice a day. Use Dulcolax suppository on an as-needed  basis. For now we'll continue to use MOM every third day as needed. Patient will keep stool diary for 4 weeks and send [place] a copy. If she does not respond to this combination will consider Amitiza. Office visit in 2 months.

## 2013-04-30 NOTE — Patient Instructions (Signed)
Continue high fiber diet. Lactulose 2 tablespoonfuls by mouth twice daily. Use Dulcolax suppository on an as-needed basis. Keep stool diary for 4 weeks and send [place] a copy for review.

## 2013-05-16 ENCOUNTER — Telehealth (INDEPENDENT_AMBULATORY_CARE_PROVIDER_SITE_OTHER): Payer: Self-pay | Admitting: *Deleted

## 2013-05-16 NOTE — Telephone Encounter (Signed)
I have talked with patient. She states that she is doing everything that she was told except the MOM. She thought she wasn't to take it at all. She was told that she could take it every 3 rd day.Also it is noted that if the patient didn't respond to the combination given to her , would consider Amitiza.  Below is the assessment and Plan for 04/30/13   Assessment:  #1. Chronic constipation made worse with her medications.  She has not responded to Linzess.  It is possible that she also has IBS or constipation predominant IBS.  Plan:  Discontinue Linzess.  Continue high fiber diet.  Lactulose 30 mL by mouth twice a day.  Use Dulcolax suppository on an as-needed basis.  For now we'll continue to use MOM every third day as needed.  Patient will keep stool diary for 4 weeks and send [place] a copy. If she does not respond to this combination will consider Amitiza.  Office visit in 2 months.   Forwarded to Dr.Rehman to address , patient was advised that I would call her once I know something. She has an appointment with NUR in January 2015.

## 2013-05-16 NOTE — Telephone Encounter (Signed)
The Lactulose is not working for her. She is still having abd pain, cramps, bloating and blood in her stools. Her return phone number is [phone].

## 2013-05-16 NOTE — Telephone Encounter (Signed)
Discontinue lactulose. Amitiza 24 mcg by mouth twice a day. Please give samples for 2 weeks if some available otherwise call prescription for one month with 2 refills

## 2013-05-17 NOTE — Telephone Encounter (Signed)
Patient was called and made aware of Dr.Rehman 's recommendations. She will pick the samples up on Monday.

## 2013-05-21 ENCOUNTER — Ambulatory Visit (INDEPENDENT_AMBULATORY_CARE_PROVIDER_SITE_OTHER): Payer: BC Managed Care – PPO | Admitting: Internal Medicine

## 2013-05-28 ENCOUNTER — Ambulatory Visit (INDEPENDENT_AMBULATORY_CARE_PROVIDER_SITE_OTHER): Payer: BC Managed Care – PPO | Admitting: Internal Medicine

## 2013-06-04 ENCOUNTER — Telehealth (INDEPENDENT_AMBULATORY_CARE_PROVIDER_SITE_OTHER): Payer: Self-pay | Admitting: *Deleted

## 2013-06-04 NOTE — Telephone Encounter (Signed)
Niki called to let Dr. Karilyn Cota know the Amitiza 24 mcg is working good. She would like to get a Rx called to [facility] to be pick up before the first of the year. Her insurance will be changing. If there is any questions, she can be reached at [phone].

## 2013-06-04 NOTE — Telephone Encounter (Signed)
A prescription for Amitiza 24 mcg called to [facility] . Take 1 by mouth twice a day 1 month plus 11 refills. Patient was called and made aware.

## 2013-06-12 ENCOUNTER — Ambulatory Visit: Payer: BC Managed Care – PPO

## 2013-06-12 ENCOUNTER — Other Ambulatory Visit: Payer: BC Managed Care – PPO

## 2013-06-24 ENCOUNTER — Encounter (INDEPENDENT_AMBULATORY_CARE_PROVIDER_SITE_OTHER): Payer: Self-pay | Admitting: *Deleted

## 2013-06-26 ENCOUNTER — Other Ambulatory Visit: Payer: Self-pay | Admitting: Neurosurgery

## 2013-06-27 ENCOUNTER — Encounter (HOSPITAL_COMMUNITY): Payer: Self-pay | Admitting: Pharmacy Technician

## 2013-07-01 ENCOUNTER — Encounter (INDEPENDENT_AMBULATORY_CARE_PROVIDER_SITE_OTHER): Payer: Self-pay

## 2013-07-01 ENCOUNTER — Ambulatory Visit (INDEPENDENT_AMBULATORY_CARE_PROVIDER_SITE_OTHER): Payer: BC Managed Care – PPO | Admitting: Obstetrics and Gynecology

## 2013-07-01 ENCOUNTER — Encounter: Payer: Self-pay | Admitting: Obstetrics and Gynecology

## 2013-07-01 VITALS — BP 140/88 | Ht 66.0 in | Wt 232.6 lb

## 2013-07-01 DIAGNOSIS — Z3202 Encounter for pregnancy test, result negative: Secondary | ICD-10-CM

## 2013-07-01 LAB — POCT URINE PREGNANCY: PREG TEST UR: NEGATIVE

## 2013-07-01 NOTE — Progress Notes (Signed)
Patient ID: Sharon Vazquez, female   DOB: 05-24-1981, 33 y.o.   MRN: [id number] Pt here today for urine pregnancy test, resulted negative

## 2013-07-02 ENCOUNTER — Ambulatory Visit (HOSPITAL_COMMUNITY)
Admission: RE | Admit: 2013-07-02 | Discharge: 2013-07-02 | Disposition: A | Discharge status: Home / Self Care | Payer: BC Managed Care – PPO | Source: Ambulatory Visit | Attending: Anesthesiology | Admitting: Anesthesiology

## 2013-07-02 ENCOUNTER — Encounter (HOSPITAL_COMMUNITY): Payer: Self-pay

## 2013-07-02 ENCOUNTER — Encounter (HOSPITAL_COMMUNITY)
Admission: RE | Admit: 2013-07-02 | Discharge: 2013-07-02 | Disposition: A | Discharge status: Home / Self Care | Payer: BC Managed Care – PPO | Source: Ambulatory Visit | Attending: Neurosurgery | Admitting: Neurosurgery

## 2013-07-02 DIAGNOSIS — Z01818 Encounter for other preprocedural examination: Secondary | ICD-10-CM | POA: Insufficient documentation

## 2013-07-02 DIAGNOSIS — I10 Essential (primary) hypertension: Secondary | ICD-10-CM

## 2013-07-02 HISTORY — DX: Effusion, unspecified joint: M25.40

## 2013-07-02 HISTORY — DX: Other chronic pain: G89.29

## 2013-07-02 HISTORY — DX: Urgency of urination: R39.15

## 2013-07-02 HISTORY — DX: Gastro-esophageal reflux disease without esophagitis: K21.9

## 2013-07-02 HISTORY — DX: Other hemorrhoids: K64.8

## 2013-07-02 HISTORY — DX: Shortness of breath: R06.02

## 2013-07-02 HISTORY — DX: Constipation, unspecified: K59.00

## 2013-07-02 HISTORY — DX: Weakness: R53.1

## 2013-07-02 HISTORY — DX: Mental disorder, not otherwise specified: F99

## 2013-07-02 HISTORY — DX: Unspecified asthma, uncomplicated: J45.909

## 2013-07-02 HISTORY — DX: Dermatitis, unspecified: L30.9

## 2013-07-02 HISTORY — DX: Personal history of other diseases of the respiratory system: Z87.09

## 2013-07-02 HISTORY — DX: Personal history of colon polyps, unspecified: Z86.0100

## 2013-07-02 HISTORY — DX: Insomnia, unspecified: G47.00

## 2013-07-02 HISTORY — DX: Personal history of colonic polyps: Z86.010

## 2013-07-02 HISTORY — DX: Other chronic pain: M54.9

## 2013-07-02 HISTORY — DX: Muscle spasm of back: M62.830

## 2013-07-02 LAB — CBC
HCT: 38 % (ref 36.0–46.0)
Hemoglobin: 13.6 g/dL (ref 12.0–15.0)
MCH: 31.4 pg (ref 26.0–34.0)
MCHC: 35.8 g/dL (ref 30.0–36.0)
MCV: 87.8 fL (ref 78.0–100.0)
Platelets: 397 10*3/uL (ref 150–400)
RBC: 4.33 MIL/uL (ref 3.87–5.11)
RDW: 13.4 % (ref 11.5–15.5)
WBC: 10.9 10*3/uL — AB (ref 4.0–10.5)

## 2013-07-02 LAB — BASIC METABOLIC PANEL
BUN: 8 mg/dL (ref 6–23)
CO2: 22 mEq/L (ref 19–32)
CREATININE: 1 mg/dL (ref 0.50–1.10)
Calcium: 9.3 mg/dL (ref 8.4–10.5)
Chloride: 105 mEq/L (ref 96–112)
GFR, EST AFRICAN AMERICAN: 85 mL/min — AB (ref >90)
GFR, EST NON AFRICAN AMERICAN: 74 mL/min — AB (ref >90)
Glucose, Bld: 104 mg/dL — ABNORMAL HIGH (ref 70–99)
Potassium: 3.9 mEq/L (ref 3.7–5.3)
Sodium: 140 mEq/L (ref 137–147)

## 2013-07-02 LAB — SURGICAL PCR SCREEN
MRSA, PCR: NEGATIVE
STAPHYLOCOCCUS AUREUS: NEGATIVE

## 2013-07-02 LAB — HCG, SERUM, QUALITATIVE: PREG SERUM: NEGATIVE

## 2013-07-02 MED ORDER — CEFAZOLIN SODIUM-DEXTROSE 2-3 GM-% IV SOLR
2.0000 g | INTRAVENOUS | Status: AC
  Administered 2013-07-03: 2 g via INTRAVENOUS
  Filled 2013-07-02: qty 50

## 2013-07-02 NOTE — Progress Notes (Signed)
Pt doesn't have a cardiologist  Denies ever having an echo/stress test/heart cath  Medical Md is with [facility] Dr.Amelia Redmond Pulling  Denies EKG or CXR in past yr

## 2013-07-02 NOTE — Pre-Procedure Instructions (Signed)
Sharon Vazquez  07/02/2013   Your procedure is scheduled on:  Wed, Jan 28 @ 1:15 PM  Report to [facility] Short Stay Entrance A  at 10:15 AM.  Call this number if you have problems the morning of surgery: [phone]   Remember:   Do not eat food or drink liquids after midnight.   Take these medicines the morning of surgery with A SIP OF WATER: Albuterol<Bring Your Inhaler With You>Xanax(Alprazolam),Abilify(Aripiprazole),QVAR(Beclmethasone),Catapres(Clonidine),Sinequan(Doxepin),Flonase(Fluticasone),Amitiza(Lubiprostone),and Inderal(Propranolol)              No Goody's,BC's,Aleve,Aspirin,Fish Oil,Ibuprofen,or any Herbal Medications   Do not wear jewelry, make-up or nail polish.  Do not wear lotions, powders, or perfumes. You may wear deodorant.  Do not shave 48 hours prior to surgery.   Do not bring valuables to the hospital.  [facility] is not responsible                  for any belongings or valuables.               Contacts, dentures or bridgework may not be worn into surgery.  Leave suitcase in the car. After surgery it may be brought to your room.  For patients admitted to the hospital, discharge time is determined by your                treatment team.               Patients discharged the day of surgery will not be allowed to drive  home.    Special Instructions: Shower using CHG 2 nights before surgery and the night before surgery.  If you shower the day of surgery use CHG.  Use special wash - you have one bottle of CHG for all showers.  You should use approximately 1/3 of the bottle for each shower.   Please read over the following fact sheets that you were given: Pain Booklet, Coughing and Deep Breathing, MRSA Information and Surgical Site Infection Prevention

## 2013-07-03 ENCOUNTER — Ambulatory Visit (HOSPITAL_COMMUNITY)
Admission: RE | Admit: 2013-07-03 | Discharge: 2013-07-04 | Disposition: A | Discharge status: Home / Self Care | Payer: BC Managed Care – PPO | Source: Ambulatory Visit | Attending: Neurosurgery | Admitting: Neurosurgery

## 2013-07-03 ENCOUNTER — Encounter (HOSPITAL_COMMUNITY)
Admission: RE | Disposition: A | Discharge status: Home / Self Care | Payer: Self-pay | Source: Ambulatory Visit | Attending: Neurosurgery

## 2013-07-03 ENCOUNTER — Encounter (HOSPITAL_COMMUNITY): Payer: BC Managed Care – PPO | Admitting: Anesthesiology

## 2013-07-03 ENCOUNTER — Ambulatory Visit (HOSPITAL_COMMUNITY): Payer: BC Managed Care – PPO

## 2013-07-03 ENCOUNTER — Encounter (HOSPITAL_COMMUNITY): Payer: Self-pay | Admitting: Anesthesiology

## 2013-07-03 ENCOUNTER — Ambulatory Visit (HOSPITAL_COMMUNITY): Payer: BC Managed Care – PPO | Admitting: Anesthesiology

## 2013-07-03 DIAGNOSIS — Z01818 Encounter for other preprocedural examination: Secondary | ICD-10-CM | POA: Insufficient documentation

## 2013-07-03 DIAGNOSIS — Z23 Encounter for immunization: Secondary | ICD-10-CM | POA: Insufficient documentation

## 2013-07-03 DIAGNOSIS — Z01812 Encounter for preprocedural laboratory examination: Secondary | ICD-10-CM | POA: Insufficient documentation

## 2013-07-03 DIAGNOSIS — K589 Irritable bowel syndrome without diarrhea: Secondary | ICD-10-CM | POA: Insufficient documentation

## 2013-07-03 DIAGNOSIS — M5126 Other intervertebral disc displacement, lumbar region: Secondary | ICD-10-CM | POA: Diagnosis present

## 2013-07-03 DIAGNOSIS — F319 Bipolar disorder, unspecified: Secondary | ICD-10-CM | POA: Insufficient documentation

## 2013-07-03 DIAGNOSIS — K219 Gastro-esophageal reflux disease without esophagitis: Secondary | ICD-10-CM | POA: Insufficient documentation

## 2013-07-03 DIAGNOSIS — Z01810 Encounter for preprocedural cardiovascular examination: Secondary | ICD-10-CM | POA: Insufficient documentation

## 2013-07-03 DIAGNOSIS — F410 Panic disorder [episodic paroxysmal anxiety] without agoraphobia: Secondary | ICD-10-CM | POA: Insufficient documentation

## 2013-07-03 DIAGNOSIS — K819 Cholecystitis, unspecified: Secondary | ICD-10-CM | POA: Insufficient documentation

## 2013-07-03 DIAGNOSIS — I10 Essential (primary) hypertension: Secondary | ICD-10-CM | POA: Insufficient documentation

## 2013-07-03 DIAGNOSIS — F909 Attention-deficit hyperactivity disorder, unspecified type: Secondary | ICD-10-CM | POA: Insufficient documentation

## 2013-07-03 HISTORY — PX: LUMBAR LAMINECTOMY/DECOMPRESSION MICRODISCECTOMY: SHX5026

## 2013-07-03 SURGERY — LUMBAR LAMINECTOMY/DECOMPRESSION MICRODISCECTOMY 1 LEVEL
Anesthesia: General | Laterality: Left

## 2013-07-03 MED ORDER — TOPIRAMATE 25 MG PO TABS
25.0000 mg | ORAL_TABLET | Freq: Two times a day (BID) | ORAL | Status: DC
Start: 2013-07-03 — End: 2013-07-03

## 2013-07-03 MED ORDER — 0.9 % SODIUM CHLORIDE (POUR BTL) OPTIME
TOPICAL | Status: DC | PRN
  Administered 2013-07-03: 1000 mL

## 2013-07-03 MED ORDER — FENTANYL CITRATE 0.05 MG/ML IJ SOLN
INTRAMUSCULAR | Status: DC | PRN
  Administered 2013-07-03 (×2): 50 ug via INTRAVENOUS
  Administered 2013-07-03 (×2): 100 ug via INTRAVENOUS
  Administered 2013-07-03 (×3): 50 ug via INTRAVENOUS

## 2013-07-03 MED ORDER — SENNA 8.6 MG PO TABS
1.0000 | ORAL_TABLET | Freq: Two times a day (BID) | ORAL | Status: DC
  Administered 2013-07-04: 8.6 mg via ORAL
  Filled 2013-07-03 (×2): qty 1

## 2013-07-03 MED ORDER — ROCURONIUM BROMIDE 100 MG/10ML IV SOLN
INTRAVENOUS | Status: DC | PRN
  Administered 2013-07-03: 50 mg via INTRAVENOUS

## 2013-07-03 MED ORDER — TOPIRAMATE 25 MG PO TABS
50.0000 mg | ORAL_TABLET | Freq: Every day | ORAL | Status: DC
  Administered 2013-07-03: 50 mg via ORAL
  Filled 2013-07-03 (×2): qty 2

## 2013-07-03 MED ORDER — ROCURONIUM BROMIDE 50 MG/5ML IV SOLN
INTRAVENOUS | Status: AC
  Filled 2013-07-03: qty 1

## 2013-07-03 MED ORDER — CHLORHEXIDINE GLUCONATE 0.12 % MT SOLN
15.0000 mL | Freq: Two times a day (BID) | OROMUCOSAL | Status: DC
  Administered 2013-07-03 – 2013-07-04 (×2): 15 mL via OROMUCOSAL
  Filled 2013-07-03 (×4): qty 15

## 2013-07-03 MED ORDER — GLYCOPYRROLATE 0.2 MG/ML IJ SOLN
INTRAMUSCULAR | Status: AC
  Filled 2013-07-03: qty 1

## 2013-07-03 MED ORDER — GLYCOPYRROLATE 0.2 MG/ML IJ SOLN
INTRAMUSCULAR | Status: DC | PRN
  Administered 2013-07-03: .6 mg via INTRAVENOUS

## 2013-07-03 MED ORDER — ALBUTEROL SULFATE (2.5 MG/3ML) 0.083% IN NEBU: 2.5000 mg | INHALATION_SOLUTION | Freq: Two times a day (BID) | RESPIRATORY_TRACT | Status: DC | PRN

## 2013-07-03 MED ORDER — PROPOFOL 10 MG/ML IV BOLUS
INTRAVENOUS | Status: AC
  Filled 2013-07-03: qty 20

## 2013-07-03 MED ORDER — THROMBIN 5000 UNITS EX SOLR
CUTANEOUS | Status: DC | PRN
  Administered 2013-07-03 (×2): 5000 [IU] via TOPICAL

## 2013-07-03 MED ORDER — BUPIVACAINE HCL 0.5 % IJ SOLN
INTRAMUSCULAR | Status: DC | PRN
  Administered 2013-07-03: 30 mL

## 2013-07-03 MED ORDER — LACTATED RINGERS IV SOLN
INTRAVENOUS | Status: DC
  Administered 2013-07-03: 12:00:00 via INTRAVENOUS

## 2013-07-03 MED ORDER — TOPIRAMATE 25 MG PO TABS
25.0000 mg | ORAL_TABLET | Freq: Every day | ORAL | Status: DC
  Administered 2013-07-04: 25 mg via ORAL
  Filled 2013-07-03: qty 1

## 2013-07-03 MED ORDER — SODIUM CHLORIDE 0.9 % IV SOLN: 250.0000 mL | INTRAVENOUS | Status: DC

## 2013-07-03 MED ORDER — DOXEPIN HCL 50 MG PO CAPS
50.0000 mg | ORAL_CAPSULE | Freq: Two times a day (BID) | ORAL | Status: DC
  Administered 2013-07-03 – 2013-07-04 (×2): 50 mg via ORAL
  Filled 2013-07-03 (×3): qty 1

## 2013-07-03 MED ORDER — ONDANSETRON HCL 4 MG/2ML IJ SOLN
INTRAMUSCULAR | Status: AC
  Filled 2013-07-03: qty 2

## 2013-07-03 MED ORDER — OLOPATADINE HCL 0.1 % OP SOLN
1.0000 [drp] | Freq: Two times a day (BID) | OPHTHALMIC | Status: DC
  Administered 2013-07-03 – 2013-07-04 (×2): 1 [drp] via OPHTHALMIC
  Filled 2013-07-03: qty 5

## 2013-07-03 MED ORDER — EPINEPHRINE 0.3 MG/0.3ML IJ SOAJ
0.3000 mg | INTRAMUSCULAR | Status: DC | PRN
  Filled 2013-07-03: qty 0.6

## 2013-07-03 MED ORDER — FLUTICASONE PROPIONATE HFA 44 MCG/ACT IN AERO
2.0000 | INHALATION_SPRAY | Freq: Two times a day (BID) | RESPIRATORY_TRACT | Status: DC
  Administered 2013-07-03 – 2013-07-04 (×2): 2 via RESPIRATORY_TRACT
  Filled 2013-07-03: qty 10.6

## 2013-07-03 MED ORDER — CLONIDINE HCL 0.1 MG PO TABS
0.1000 mg | ORAL_TABLET | Freq: Two times a day (BID) | ORAL | Status: DC
  Administered 2013-07-03 – 2013-07-04 (×2): 0.1 mg via ORAL
  Filled 2013-07-03 (×3): qty 1

## 2013-07-03 MED ORDER — ALPRAZOLAM 0.5 MG PO TABS
2.0000 mg | ORAL_TABLET | Freq: Three times a day (TID) | ORAL | Status: DC | PRN
  Administered 2013-07-03: 2 mg via ORAL
  Filled 2013-07-03: qty 4

## 2013-07-03 MED ORDER — ARTIFICIAL TEARS OP OINT
TOPICAL_OINTMENT | OPHTHALMIC | Status: AC
  Filled 2013-07-03: qty 3.5

## 2013-07-03 MED ORDER — FENTANYL CITRATE 0.05 MG/ML IJ SOLN
INTRAMUSCULAR | Status: AC
  Filled 2013-07-03: qty 5

## 2013-07-03 MED ORDER — SODIUM CHLORIDE 0.9 % IJ SOLN
INTRAMUSCULAR | Status: AC
  Filled 2013-07-03: qty 10

## 2013-07-03 MED ORDER — GLYCOPYRROLATE 0.2 MG/ML IJ SOLN
INTRAMUSCULAR | Status: AC
  Filled 2013-07-03: qty 2

## 2013-07-03 MED ORDER — PHENOL 1.4 % MT LIQD: 1.0000 | OROMUCOSAL | Status: DC | PRN

## 2013-07-03 MED ORDER — LACTATED RINGERS IV SOLN
INTRAVENOUS | Status: DC | PRN
  Administered 2013-07-03 (×2): via INTRAVENOUS

## 2013-07-03 MED ORDER — KETOROLAC TROMETHAMINE 30 MG/ML IJ SOLN
INTRAMUSCULAR | Status: AC
  Administered 2013-07-03: 30 mg
  Filled 2013-07-03: qty 1

## 2013-07-03 MED ORDER — FLUTICASONE PROPIONATE 50 MCG/ACT NA SUSP
1.0000 | Freq: Every day | NASAL | Status: DC | PRN
  Administered 2013-07-04: 2 via NASAL
  Filled 2013-07-03: qty 16

## 2013-07-03 MED ORDER — POLYETHYLENE GLYCOL 3350 17 G PO PACK
17.0000 g | PACK | Freq: Every day | ORAL | Status: DC | PRN
  Filled 2013-07-03: qty 1

## 2013-07-03 MED ORDER — ONDANSETRON HCL 4 MG/2ML IJ SOLN
INTRAMUSCULAR | Status: DC | PRN
  Administered 2013-07-03: 4 mg via INTRAVENOUS

## 2013-07-03 MED ORDER — LUBIPROSTONE 24 MCG PO CAPS
24.0000 ug | ORAL_CAPSULE | Freq: Two times a day (BID) | ORAL | Status: DC
  Administered 2013-07-04: 24 ug via ORAL
  Filled 2013-07-03 (×3): qty 1

## 2013-07-03 MED ORDER — ARTIFICIAL TEARS OP OINT
TOPICAL_OINTMENT | OPHTHALMIC | Status: DC | PRN
  Administered 2013-07-03: 1 via OPHTHALMIC

## 2013-07-03 MED ORDER — ONDANSETRON HCL 4 MG/2ML IJ SOLN: 4.0000 mg | INTRAMUSCULAR | Status: DC | PRN

## 2013-07-03 MED ORDER — MENTHOL 3 MG MT LOZG: 1.0000 | LOZENGE | OROMUCOSAL | Status: DC | PRN

## 2013-07-03 MED ORDER — PROPOFOL 10 MG/ML IV BOLUS
INTRAVENOUS | Status: DC | PRN
  Administered 2013-07-03: 200 mg via INTRAVENOUS

## 2013-07-03 MED ORDER — ZOLPIDEM TARTRATE 5 MG PO TABS
10.0000 mg | ORAL_TABLET | Freq: Every day | ORAL | Status: DC
  Administered 2013-07-03: 10 mg via ORAL
  Filled 2013-07-03: qty 2

## 2013-07-03 MED ORDER — MIDAZOLAM HCL 5 MG/5ML IJ SOLN
INTRAMUSCULAR | Status: DC | PRN
  Administered 2013-07-03: 2 mg via INTRAVENOUS

## 2013-07-03 MED ORDER — OXYCODONE-ACETAMINOPHEN 5-325 MG PO TABS
1.0000 | ORAL_TABLET | ORAL | Status: DC | PRN
  Administered 2013-07-04 (×2): 2 via ORAL
  Filled 2013-07-03 (×2): qty 2

## 2013-07-03 MED ORDER — ACETAMINOPHEN 650 MG RE SUPP: 650.0000 mg | RECTAL | Status: DC | PRN

## 2013-07-03 MED ORDER — MIDAZOLAM HCL 2 MG/2ML IJ SOLN: 1.0000 mg | INTRAMUSCULAR | Status: DC | PRN

## 2013-07-03 MED ORDER — ALBUTEROL SULFATE HFA 108 (90 BASE) MCG/ACT IN AERS: 2.0000 | INHALATION_SPRAY | Freq: Two times a day (BID) | RESPIRATORY_TRACT | Status: DC | PRN

## 2013-07-03 MED ORDER — ACETAMINOPHEN 325 MG PO TABS: 650.0000 mg | ORAL_TABLET | ORAL | Status: DC | PRN

## 2013-07-03 MED ORDER — KETOROLAC TROMETHAMINE 30 MG/ML IJ SOLN
30.0000 mg | Freq: Four times a day (QID) | INTRAMUSCULAR | Status: DC
  Administered 2013-07-03 – 2013-07-04 (×4): 30 mg via INTRAVENOUS
  Filled 2013-07-03 (×6): qty 1

## 2013-07-03 MED ORDER — HYDROCODONE-ACETAMINOPHEN 5-325 MG PO TABS
1.0000 | ORAL_TABLET | ORAL | Status: DC | PRN
  Administered 2013-07-03 – 2013-07-04 (×2): 2 via ORAL
  Filled 2013-07-03 (×2): qty 2

## 2013-07-03 MED ORDER — SODIUM CHLORIDE 0.9 % IJ SOLN
3.0000 mL | Freq: Two times a day (BID) | INTRAMUSCULAR | Status: DC
  Administered 2013-07-04: 3 mL via INTRAVENOUS

## 2013-07-03 MED ORDER — SODIUM CHLORIDE 0.9 % IJ SOLN: 3.0000 mL | INTRAMUSCULAR | Status: DC | PRN

## 2013-07-03 MED ORDER — NEOSTIGMINE METHYLSULFATE 1 MG/ML IJ SOLN
INTRAMUSCULAR | Status: DC | PRN
  Administered 2013-07-03: 4 mg via INTRAVENOUS

## 2013-07-03 MED ORDER — CLOBETASOL PROPIONATE 0.05 % EX CREA
1.0000 "application " | TOPICAL_CREAM | Freq: Two times a day (BID) | CUTANEOUS | Status: DC | PRN
  Filled 2013-07-03: qty 15

## 2013-07-03 MED ORDER — LIDOCAINE-EPINEPHRINE 0.5 %-1:200000 IJ SOLN
INTRAMUSCULAR | Status: DC | PRN
  Administered 2013-07-03: 5 mL via INTRADERMAL

## 2013-07-03 MED ORDER — NEOSTIGMINE METHYLSULFATE 1 MG/ML IJ SOLN
INTRAMUSCULAR | Status: AC
  Filled 2013-07-03: qty 10

## 2013-07-03 MED ORDER — HEMOSTATIC AGENTS (NO CHARGE) OPTIME
TOPICAL | Status: DC | PRN
  Administered 2013-07-03: 1 via TOPICAL

## 2013-07-03 MED ORDER — NORETHINDRONE 0.35 MG PO TABS: 1.0000 | ORAL_TABLET | Freq: Every day | ORAL | Status: DC

## 2013-07-03 MED ORDER — FENTANYL CITRATE 0.05 MG/ML IJ SOLN
25.0000 ug | INTRAMUSCULAR | Status: DC | PRN
  Administered 2013-07-03 (×2): 50 ug via INTRAVENOUS

## 2013-07-03 MED ORDER — LIDOCAINE HCL (CARDIAC) 20 MG/ML IV SOLN
INTRAVENOUS | Status: DC | PRN
  Administered 2013-07-03: 100 mg via INTRAVENOUS

## 2013-07-03 MED ORDER — PROPRANOLOL HCL 10 MG PO TABS
10.0000 mg | ORAL_TABLET | Freq: Three times a day (TID) | ORAL | Status: DC
  Administered 2013-07-03 – 2013-07-04 (×2): 10 mg via ORAL
  Filled 2013-07-03 (×4): qty 1

## 2013-07-03 MED ORDER — FENTANYL CITRATE 0.05 MG/ML IJ SOLN
INTRAMUSCULAR | Status: AC
  Administered 2013-07-03: 50 ug via INTRAVENOUS
  Filled 2013-07-03: qty 2

## 2013-07-03 MED ORDER — BISACODYL 5 MG PO TBEC: 5.0000 mg | DELAYED_RELEASE_TABLET | Freq: Every day | ORAL | Status: DC | PRN

## 2013-07-03 MED ORDER — ARIPIPRAZOLE 5 MG PO TABS
2.5000 mg | ORAL_TABLET | Freq: Every day | ORAL | Status: DC
  Administered 2013-07-04: 2.5 mg via ORAL
  Filled 2013-07-03: qty 1

## 2013-07-03 MED ORDER — MIDAZOLAM HCL 2 MG/2ML IJ SOLN
INTRAMUSCULAR | Status: AC
  Filled 2013-07-03: qty 2

## 2013-07-03 MED ORDER — PROMETHAZINE HCL 25 MG/ML IJ SOLN: 6.2500 mg | INTRAMUSCULAR | Status: DC | PRN

## 2013-07-03 MED ORDER — CYCLOBENZAPRINE HCL 10 MG PO TABS
10.0000 mg | ORAL_TABLET | Freq: Three times a day (TID) | ORAL | Status: DC | PRN
  Administered 2013-07-04: 10 mg via ORAL
  Filled 2013-07-03: qty 1

## 2013-07-03 MED ORDER — EPHEDRINE SULFATE 50 MG/ML IJ SOLN
INTRAMUSCULAR | Status: AC
  Filled 2013-07-03: qty 1

## 2013-07-03 MED ORDER — POTASSIUM CHLORIDE IN NACL 20-0.9 MEQ/L-% IV SOLN
INTRAVENOUS | Status: DC
  Administered 2013-07-03: 1000 mL via INTRAVENOUS
  Filled 2013-07-03 (×3): qty 1000

## 2013-07-03 MED ORDER — FENTANYL CITRATE 0.05 MG/ML IJ SOLN: 50.0000 ug | Freq: Once | INTRAMUSCULAR | Status: DC

## 2013-07-03 SURGICAL SUPPLY — 49 items
ADH SKN CLS APL DERMABOND .7 (GAUZE/BANDAGES/DRESSINGS) ×1
APL SKNCLS STERI-STRIP NONHPOA (GAUZE/BANDAGES/DRESSINGS)
BAG DECANTER FOR FLEXI CONT (MISCELLANEOUS) ×2 IMPLANT
BENZOIN TINCTURE PRP APPL 2/3 (GAUZE/BANDAGES/DRESSINGS) IMPLANT
BLADE SURG ROTATE 9660 (MISCELLANEOUS) IMPLANT
BUR MATCHSTICK NEURO 3.0 LAGG (BURR) ×2 IMPLANT
CANISTER SUCT 3000ML (MISCELLANEOUS) ×2 IMPLANT
CONT SPEC 4OZ CLIKSEAL STRL BL (MISCELLANEOUS) ×2 IMPLANT
DECANTER SPIKE VIAL GLASS SM (MISCELLANEOUS) ×2 IMPLANT
DERMABOND ADVANCED (GAUZE/BANDAGES/DRESSINGS) ×1
DERMABOND ADVANCED .7 DNX12 (GAUZE/BANDAGES/DRESSINGS) ×1 IMPLANT
DRAPE LAPAROTOMY 100X72X124 (DRAPES) ×2 IMPLANT
DRAPE MICROSCOPE LEICA (MISCELLANEOUS) ×2 IMPLANT
DRAPE POUCH INSTRU U-SHP 10X18 (DRAPES) ×2 IMPLANT
DRAPE SURG 17X23 STRL (DRAPES) ×2 IMPLANT
DURAPREP 26ML APPLICATOR (WOUND CARE) ×2 IMPLANT
ELECT REM PT RETURN 9FT ADLT (ELECTROSURGICAL) ×2
ELECTRODE REM PT RTRN 9FT ADLT (ELECTROSURGICAL) ×1 IMPLANT
GAUZE SPONGE 4X4 16PLY XRAY LF (GAUZE/BANDAGES/DRESSINGS) IMPLANT
GLOVE ECLIPSE 6.5 STRL STRAW (GLOVE) ×2 IMPLANT
GLOVE EXAM NITRILE LRG STRL (GLOVE) IMPLANT
GLOVE EXAM NITRILE MD LF STRL (GLOVE) IMPLANT
GLOVE EXAM NITRILE XL STR (GLOVE) IMPLANT
GLOVE EXAM NITRILE XS STR PU (GLOVE) IMPLANT
GOWN BRE IMP SLV AUR LG STRL (GOWN DISPOSABLE) ×4 IMPLANT
GOWN BRE IMP SLV AUR XL STRL (GOWN DISPOSABLE) IMPLANT
GOWN STRL REIN 2XL LVL4 (GOWN DISPOSABLE) IMPLANT
KIT BASIN OR (CUSTOM PROCEDURE TRAY) ×2 IMPLANT
KIT ROOM TURNOVER OR (KITS) ×2 IMPLANT
NDL HYPO 25X1 1.5 SAFETY (NEEDLE) ×1 IMPLANT
NDL SPNL 18GX3.5 QUINCKE PK (NEEDLE) IMPLANT
NEEDLE HYPO 25X1 1.5 SAFETY (NEEDLE) ×2 IMPLANT
NEEDLE SPNL 18GX3.5 QUINCKE PK (NEEDLE) IMPLANT
NS IRRIG 1000ML POUR BTL (IV SOLUTION) ×2 IMPLANT
PACK LAMINECTOMY NEURO (CUSTOM PROCEDURE TRAY) ×2 IMPLANT
PAD ARMBOARD 7.5X6 YLW CONV (MISCELLANEOUS) ×6 IMPLANT
RUBBERBAND STERILE (MISCELLANEOUS) ×4 IMPLANT
SPONGE GAUZE 4X4 12PLY (GAUZE/BANDAGES/DRESSINGS) IMPLANT
SPONGE LAP 4X18 X RAY DECT (DISPOSABLE) IMPLANT
SPONGE SURGIFOAM ABS GEL SZ50 (HEMOSTASIS) ×2 IMPLANT
STRIP CLOSURE SKIN 1/2X4 (GAUZE/BANDAGES/DRESSINGS) IMPLANT
SUT VIC AB 0 CT1 18XCR BRD8 (SUTURE) ×1 IMPLANT
SUT VIC AB 0 CT1 8-18 (SUTURE) ×2
SUT VIC AB 2-0 CT1 18 (SUTURE) ×2 IMPLANT
SUT VIC AB 3-0 SH 8-18 (SUTURE) ×3 IMPLANT
SYR 20ML ECCENTRIC (SYRINGE) ×2 IMPLANT
TOWEL OR 17X24 6PK STRL BLUE (TOWEL DISPOSABLE) ×2 IMPLANT
TOWEL OR 17X26 10 PK STRL BLUE (TOWEL DISPOSABLE) ×2 IMPLANT
WATER STERILE IRR 1000ML POUR (IV SOLUTION) ×2 IMPLANT

## 2013-07-03 NOTE — Op Note (Signed)
07/03/2013  6:10 PM  PATIENT:  Sharon Vazquez  33 y.o. female  PRE-OPERATIVE DIAGNOSIS:  lumbar herniated disc  POST-OPERATIVE DIAGNOSIS:  lumbar herniated disc  PROCEDURE:  Procedure(s): LEFT LUMBAR THREE-FOUR microdiskectomy  SURGEON:  Surgeon(s): Winfield Cunas, MD Ophelia Charter, MD  ASSISTANTS:Jenkins  ANESTHESIA:   general  EBL:  Total I/O In: 1000 [I.V.:1000] Out: -   BLOOD ADMINISTERED:none  CELL SAVER GIVEN:none  COUNT:per nursing  DRAINS: none   SPECIMEN:  No Specimen  DICTATION: [name] was taken to the operating room, intubated and placed under a general anesthetic without difficulty. He was positioned prone on a Wilson frame with all pressure points padded. Her back was prepped and draped in a sterile manner. I opened the skin with a 10 blade and carried the dissection down to the thoracolumbar fascia. I used both sharp dissection and the monopolar cautery to expose the lamina of L4, and L5. I confirmed my location with an intraoperative xray.  I used the drill, Kerrison punches, and curettes to perform a semihemilaminectomy of L4. I used the punches to remove the ligamentum flavum to expose the thecal sac. I brought the microscope into the operative field and with Dr.Jenkins's assistance we started our decompression of the spinal canal, thecal sac and L4,and 5 root(s). I cauterized epidural veins overlying the disc space then divided them sharply. I opened the disc space with a 15 blade and proceeded with the discectomy. I used pituitary rongeurs, curettes, and other instruments to remove disc material. After the discectomy was completed we inspected the L5, and L4 nerve root and felt it was well decompressed. I explored rostrally, laterally, medially, and caudally and was satisfied with the decompression. I irrigated the wound, then closed in layers. I approximated the thoracolumbar fascia, subcutaneous, and subcuticular planes with vicryl sutures. I used  dermabond for a sterile dressing.   PLAN OF CARE: Admit for overnight observation  PATIENT DISPOSITION:  PACU - hemodynamically stable.   Delay start of Pharmacological VTE agent (>24hrs) due to surgical blood loss or risk of bleeding:  yes

## 2013-07-03 NOTE — Transfer of Care (Signed)
Immediate Anesthesia Transfer of Care Note  Patient: Sharon Vazquez  Procedure(s) Performed: Procedure(s) with comments: LEFT LUMBAR THREE-FOUR microdiskectomy (Left) - LEFT LUMBAR THREE-FOUR microdiskectomy  Patient Location: PACU  Anesthesia Type:General  Level of Consciousness: sedated  Airway & Oxygen Therapy: Patient Spontanous Breathing and Patient connected to face mask oxygen  Post-op Assessment: Report given to PACU RN and Post -op Vital signs reviewed and stable  Post vital signs: Reviewed and stable  Complications: No apparent anesthesia complications

## 2013-07-03 NOTE — Anesthesia Postprocedure Evaluation (Signed)
Anesthesia Post Note  Patient: Sharon Vazquez  Procedure(s) Performed: Procedure(s) (LRB): LEFT LUMBAR THREE-FOUR microdiskectomy (Left)  Anesthesia type: General  Patient location: PACU  Post pain: Pain level controlled and Adequate analgesia  Post assessment: Post-op Vital signs reviewed, Patient's Cardiovascular Status Stable, Respiratory Function Stable, Patent Airway and Pain level controlled  Last Vitals:  Filed Vitals:   07/03/13 1915  BP: 128/71  Pulse: 78  Temp:   Resp: 21    Post vital signs: Reviewed and stable  Level of consciousness: awake, alert  and oriented  Complications: No apparent anesthesia complications

## 2013-07-03 NOTE — Anesthesia Preprocedure Evaluation (Addendum)
Anesthesia Evaluation  Patient identified by MRN, date of birth, ID band Patient awake    Reviewed: Allergy & Precautions, H&P , NPO status , Patient's Chart, lab work & pertinent test results  Airway Mallampati: II TM Distance: >3 FB Neck ROM: Full    Dental   Pulmonary shortness of breath, asthma ,  breath sounds clear to auscultation        Cardiovascular hypertension, Rhythm:Regular Rate:Normal     Neuro/Psych  Headaches, Bipolar Disorder    GI/Hepatic GERD-  ,  Endo/Other    Renal/GU      Musculoskeletal   Abdominal (+) + obese,   Peds  Hematology   Anesthesia Other Findings   Reproductive/Obstetrics                          Anesthesia Physical Anesthesia Plan  ASA: III  Anesthesia Plan: General   Post-op Pain Management:    Induction: Intravenous  Airway Management Planned: Oral ETT  Additional Equipment:   Intra-op Plan:   Post-operative Plan: Extubation in OR  Informed Consent: I have reviewed the patients History and Physical, chart, labs and discussed the procedure including the risks, benefits and alternatives for the proposed anesthesia with the patient or authorized representative who has indicated his/her understanding and acceptance.     Plan Discussed with: CRNA and Surgeon  Anesthesia Plan Comments:         Anesthesia Quick Evaluation

## 2013-07-03 NOTE — Preoperative (Signed)
Beta Blockers   Reason not to administer Beta Blockers:Not Applicable 

## 2013-07-03 NOTE — Progress Notes (Signed)
Patient had a test for sleep apnea and it was negative
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
## 2013-07-04 ENCOUNTER — Encounter (HOSPITAL_COMMUNITY): Payer: Self-pay | Admitting: *Deleted

## 2013-07-04 MED ORDER — CYCLOBENZAPRINE HCL 10 MG PO TABS: 10.0000 mg | ORAL_TABLET | Freq: Three times a day (TID) | ORAL | Status: DC | PRN

## 2013-07-04 MED ORDER — OXYCODONE-ACETAMINOPHEN 5-325 MG PO TABS: 1.0000 | ORAL_TABLET | Freq: Four times a day (QID) | ORAL | Status: DC | PRN

## 2013-07-04 MED ORDER — INFLUENZA VAC SPLIT QUAD 0.5 ML IM SUSP
0.5000 mL | Freq: Once | INTRAMUSCULAR | Status: AC
  Administered 2013-07-04: 0.5 mL via INTRAMUSCULAR
  Filled 2013-07-04: qty 0.5

## 2013-07-04 MED ORDER — INFLUENZA VAC SPLIT QUAD 0.5 ML IM SUSP: 0.5000 mL | INTRAMUSCULAR | Status: DC

## 2013-07-04 NOTE — Plan of Care (Signed)
Problem: Consults Goal: Diagnosis - Spinal Surgery Outcome: Completed/Met Date Met:  07/04/13 Microdiscectomy

## 2013-07-04 NOTE — Discharge Instructions (Signed)
Lumbar Discectomy °Care After °A discectomy involves removal of discmaterial (the cartilage-like structures located between the bones of the back). It is done to relieve pressure on nerve roots. It can be used as a treatment for a back problem. The time in surgery depends on the findings in surgery and what is necessary to correct the problems. °HOME CARE INSTRUCTIONS  °· Check the cut (incision) made by the surgeon twice a day for signs of infection. Some signs of infection may include:  °· A foul smelling, greenish or yellowish discharge from the wound.  °· Increased pain.  °· Increased redness over the incision (operative) site.  °· The skin edges may separate.  °· Flu-like symptoms (problems).  °· A temperature above 101.5° F (38.6° C).  °· Change your bandages in about 24 to 36 hours following surgery or as directed.  °· You may shower tomrrow.  Avoid bathtubs, swimming pools and hot tubs for three weeks or until your incision has healed completely. °· Follow your doctor's instructions as to safe activities, exercises, and physical therapy.  °· Weight reduction may be beneficial if you are overweight.  °· Daily exercise is helpful to prevent the return of problems. Walking is permitted. You may use a treadmill without an incline. Cut down on activities and exercise if you have discomfort. You may also go up and down stairs as much as you can tolerate.  °· DO NOT lift anything heavier than 10 to 15 lbs. Avoid bending or twisting at the waist. Always bend your knees when lifting.  °· Maintain strength and range of motion as instructed.  °· Do not drive for 10 days, or as directed by your doctors. You may be a passenger . Lying back in the passenger seat may be more comfortable for you. Always wear a seatbelt.  °· Limit your sitting in a regular chair to 20 to 30 minutes at a time. There are no limitations for sitting in a recliner. You should lie down or walk in between sitting periods.  °· Only take  over-the-counter or prescription medicines for pain, discomfort, or fever as directed by your caregiver.  °SEEK MEDICAL CARE IF:  °· There is increased bleeding (more than a small spot) from the wound.  °· You notice redness, swelling, or increasing pain in the wound.  °· Pus is coming from wound.  °· You develop an unexplained oral temperature above 102° F (38.9° C) develops.  °· You notice a foul smell coming from the wound or dressing.  °· You have increasing pain in your wound.  °SEEK IMMEDIATE MEDICAL CARE IF:  °· You develop a rash.  °· You have difficulty breathing.  °· You develop any allergic problems to medicines given.  °Document Released: 04/27/2004 Document Revised: 05/12/2011 Document Reviewed: 08/16/2007 °[employer]® Patient Information °

## 2013-07-04 NOTE — Discharge Summary (Signed)
Physician Discharge Summary  Patient ID: Sharon Vazquez MRN: [id number] DOB/AGE: 33-Mar-1982 33 y.o.  Admit date: 07/03/2013 Discharge date: 07/04/2013  Admission Diagnoses:HNP L3/4 left  Discharge Diagnoses:  Active Problems:   HNP (herniated nucleus pulposus), lumbar   Discharged Condition: good  Hospital Course: Sharon Vazquez was admitted and taken to the operating room where she underwent an uncomplicated left Y8/6 semihemilaminectomy and discetomy without difficulty. Post op she has voided, ambulated, and tolerated a regular diet. Her wound is clean, dry, and without signs of infection.   Consults: None  Significant Diagnostic Studies: none  Treatments: surgery: as above  Discharge Exam: Blood pressure 107/71, pulse 84, temperature 97.6 F (36.4 C), temperature source Oral, resp. rate 20, height 5\' 6"  (1.676 m), weight 104.6 kg (230 lb 9.6 oz), last menstrual period 06/16/2013, SpO2 100.00%. General appearance: alert, cooperative and appears stated age Neurologic: Alert and oriented X 3, normal strength and tone. Normal symmetric reflexes. Normal coordination and gait  Disposition: Final discharge disposition not confirmed     Medication List         ABILIFY 5 MG tablet  Generic drug:  ARIPiprazole  Take 2.5 mg by mouth daily.     albuterol 108 (90 BASE) MCG/ACT inhaler  Commonly known as:  PROVENTIL HFA;VENTOLIN HFA  Inhale 2 puffs into the lungs 2 (two) times daily as needed for wheezing or shortness of breath.     alprazolam 2 MG tablet  Commonly known as:  XANAX  Take 2 mg by mouth 3 (three) times daily as needed for anxiety (panic attacks).     beclomethasone 80 MCG/ACT inhaler  Commonly known as:  QVAR  Inhale 2 puffs into the lungs 2 (two) times daily.     bisacodyl 5 MG EC tablet  Commonly known as:  DULCOLAX  Take 5-15 mg by mouth daily as needed for moderate constipation.     chlorhexidine 0.12 % solution  Commonly known as:  PERIDEX  Use as  directed 15 mLs in the mouth or throat 2 (two) times daily.     clobetasol cream 0.05 %  Commonly known as:  TEMOVATE  Apply 1 application topically 2 (two) times daily as needed (rash).     cloNIDine 0.1 MG tablet  Commonly known as:  CATAPRES  Take 0.1 mg by mouth 2 (two) times daily.     cyclobenzaprine 10 MG tablet  Commonly known as:  FLEXERIL  Take 10 mg by mouth 3 (three) times daily as needed for muscle spasms.     cyclobenzaprine 10 MG tablet  Commonly known as:  FLEXERIL  Take 1 tablet (10 mg total) by mouth 3 (three) times daily as needed for muscle spasms.     desonide 0.05 % cream  Commonly known as:  DESOWEN  Apply 1 application topically 2 (two) times daily as needed (rash).     doxepin 50 MG capsule  Commonly known as:  SINEQUAN  Take 50 mg by mouth 2 (two) times daily.     EPIPEN 0.3 mg/0.3 mL Soaj injection  Generic drug:  EPINEPHrine  Inject 0.3 mg into the muscle as needed (allergic reaction).     fluticasone 50 MCG/ACT nasal spray  Commonly known as:  FLONASE  Place 1-2 sprays into both nostrils daily as needed for allergies or rhinitis.     HEATHER 0.35 MG tablet  Generic drug:  norethindrone  Take 1 tablet by mouth daily.     HYDROcodone-acetaminophen 5-325 MG per tablet  Commonly  known as:  NORCO/VICODIN  Take 1 tablet by mouth 2 (two) times daily as needed (pain).     lubiprostone 24 MCG capsule  Commonly known as:  AMITIZA  Take 24 mcg by mouth 2 (two) times daily with a meal.     nystatin-triamcinolone cream  Commonly known as:  MYCOLOG II  Apply 1 application topically 2 (two) times daily as needed (internal hemmorhoids).     oxyCODONE-acetaminophen 5-325 MG per tablet  Commonly known as:  ROXICET  Take 1 tablet by mouth every 6 (six) hours as needed for severe pain.     PATADAY 0.2 % Soln  Generic drug:  Olopatadine HCl  Place 1 drop into both eyes daily as needed (itchy eyes).     propranolol 10 MG tablet  Commonly known as:   INDERAL  Take 10 mg by mouth 3 (three) times daily.     topiramate 25 MG tablet  Commonly known as:  TOPAMAX  Take 25-50 mg by mouth 2 (two) times daily. Take 1 tablet (25 mg) every morning and 2 tablets (50 mg) every night     zolpidem 10 MG tablet  Commonly known as:  AMBIEN  Take 10 mg by mouth at bedtime.           Follow-up Information   Follow up with Cordia Miklos L, MD In 3 weeks. (call to make an appointment)    Specialty:  Neurosurgery   Contact information:   [address], [address]                         [place] [address] [phone]       Signed: Pj Zehner L 07/04/2013, 2:29 PM

## 2013-07-05 ENCOUNTER — Other Ambulatory Visit: Payer: BC Managed Care – PPO | Admitting: Adult Health

## 2013-07-05 ENCOUNTER — Encounter: Payer: Self-pay | Admitting: Obstetrics and Gynecology

## 2013-07-17 ENCOUNTER — Encounter (INDEPENDENT_AMBULATORY_CARE_PROVIDER_SITE_OTHER): Payer: Self-pay | Admitting: Internal Medicine

## 2013-07-17 ENCOUNTER — Ambulatory Visit (INDEPENDENT_AMBULATORY_CARE_PROVIDER_SITE_OTHER): Payer: BC Managed Care – PPO | Admitting: Internal Medicine

## 2013-07-17 VITALS — BP 108/72 | HR 112 | Temp 98.4°F | Ht 66.0 in | Wt 231.8 lb

## 2013-07-17 DIAGNOSIS — K5900 Constipation, unspecified: Secondary | ICD-10-CM

## 2013-07-17 MED ORDER — LACTULOSE 10 GM/15ML PO SOLN: 10.0000 g | Freq: Three times a day (TID) | ORAL | Status: DC

## 2013-07-17 NOTE — Patient Instructions (Signed)
Amitiza 16mcg BID, Lactulose BID. If no BM in 3 days, try a SS enema.  OV in 2 months with a stool diary.

## 2013-07-17 NOTE — Progress Notes (Signed)
Subjective:     Patient ID: Sharon Vazquez, female   DOB: 1981-06-04, 33 y.o.   MRN: [id number]  HPI Presents today with c/o constipation.  She tells she drank 1/2 bottle of Mag Citrate, 41ml MOM, and took 3 Ducolax pills and she drank a bottle of warm water afterwards. She went to the [facility] in 5 minutes and had a large BM. This occurred last week.  She had not had a BM in 3 weeks.  Her last BM was  About 8 days ago.  Appetite is okay.  Recently had lumbar laminectomy and decompression microdisectomy by Dr. Ashok Pall  07/03/2013. Taking Amitza 69mcg BID. Given Amitiza in December and it worked for about 3 weeks. She has tried Linzess which does not help.   08/2012 Colonoscopy Dr. Laural Golden: Rt lower quadrant pain, Constipation:  Normal terminal ileum.  Single diverticulum at cecum.  10 mm broad-based polyp snared from cecum.  No evidence of endoscopic colitis.  Small external hemorrhoids   Biopsy: Sessile serrated adenoma polyp with dysplasia. No high grade dysplasia or malignancy. Appreciable resection margin, negative for dysplasia or malignan Review of Systems Current Outpatient Prescriptions  Medication Sig Dispense Refill  . albuterol (PROVENTIL HFA;VENTOLIN HFA) 108 (90 BASE) MCG/ACT inhaler Inhale 2 puffs into the lungs 2 (two) times daily as needed for wheezing or shortness of breath.      . alprazolam (XANAX) 2 MG tablet Take 2 mg by mouth 3 (three) times daily as needed for anxiety (panic attacks).      . ARIPiprazole (ABILIFY) 5 MG tablet Take 2.5 mg by mouth daily.      . beclomethasone (QVAR) 80 MCG/ACT inhaler Inhale 2 puffs into the lungs 2 (two) times daily.       . bisacodyl (DULCOLAX) 5 MG EC tablet Take 5-15 mg by mouth daily as needed for moderate constipation.      . cetirizine (ZYRTEC) 10 MG tablet Take 10 mg by mouth daily.      . chlorhexidine (PERIDEX) 0.12 % solution Use as directed 15 mLs in the mouth or throat 2 (two) times daily.      . clobetasol cream  (TEMOVATE) 3.81 % Apply 1 application topically 2 (two) times daily as needed (rash).      . cloNIDine (CATAPRES) 0.1 MG tablet Take 0.1 mg by mouth 2 (two) times daily.      . cyclobenzaprine (FLEXERIL) 10 MG tablet Take 10 mg by mouth 3 (three) times daily as needed for muscle spasms.      . cyclobenzaprine (FLEXERIL) 10 MG tablet Take 1 tablet (10 mg total) by mouth 3 (three) times daily as needed for muscle spasms.  45 tablet  0  . desonide (DESOWEN) 0.05 % cream Apply 1 application topically 2 (two) times daily as needed (rash).      Marland Kitchen doxepin (SINEQUAN) 25 MG capsule Take 50 mg by mouth 2 (two) times daily.       Marland Kitchen EPINEPHrine (EPIPEN) 0.3 mg/0.3 mL SOAJ injection Inject 0.3 mg into the muscle as needed (allergic reaction).      . fexofenadine (ALLEGRA) 180 MG tablet Take 180 mg by mouth as needed.       . fluticasone (FLONASE) 50 MCG/ACT nasal spray Place 1 spray into the nose daily.      Marland Kitchen HYDROcodone-acetaminophen (NORCO/VICODIN) 5-325 MG per tablet Take 1 tablet by mouth every 6 (six) hours as needed for moderate pain.      Marland Kitchen lubiprostone (AMITIZA) 24 MCG  capsule Take 24 mcg by mouth 2 (two) times daily with a meal.      . magnesium hydroxide (MILK OF MAGNESIA) 400 MG/5ML suspension Take 30 mLs by mouth every 3 (three) days as needed for constipation.  30 mL  0  . norethindrone (MICRONOR,CAMILA,ERRIN) 0.35 MG tablet Take 1 tablet (0.35 mg total) by mouth daily.  1 Package  11  . nystatin-triamcinolone (MYCOLOG II) cream Apply topically 2 (two) times daily. Apply cream to anal canal twice daily for 2 weeks and then as needed.  60 g  0  . nystatin-triamcinolone (MYCOLOG II) cream Apply 1 application topically 2 (two) times daily as needed (internal hemmorhoids).      . Olopatadine HCl (PATADAY) 0.2 % SOLN Place 1 drop into both eyes daily as needed (itchy eyes).      . propranolol (INDERAL) 10 MG tablet Take 10 mg by mouth 3 (three) times daily.      Marland Kitchen topiramate (TOPAMAX) 25 MG tablet Take  25-50 mg by mouth 2 (two) times daily. Take 1 tablet (25 mg) every morning and 2 tablets (50 mg) every night      . zolpidem (AMBIEN) 10 MG tablet Take 10 mg by mouth at bedtime.      Marland Kitchen lactulose (CHRONULAC) 10 GM/15ML solution Take 15 mLs (10 g total) by mouth 3 (three) times daily.  240 mL  0   No current facility-administered medications for this visit.   Past Medical History  Diagnosis Date  . Hypertension   . Anxiety   . Bipolar 1 disorder   . ADHD (attention deficit hyperactivity disorder)   . Borderline personality disorder   . Panic attack   . Headache(784.0)   . Panic disorder   . HSV-2 (herpes simplex virus 2) infection   . Hx of chlamydia infection   . IBS (irritable bowel syndrome)   . Abnormal Pap smear   . Seizures   . Right ovarian cyst 01/22/2013  . Constipation     takes Dulcolax daily as needed takes Amitiza daily  . Anxiety     takes Xanax daily as needed  . Back spasm     takes Flexeril daily as needed  . Insomnia     takes Ambien nightly  . Hypertension     takes INderal and Clonidine daily  . Shortness of breath     with exertion  . Asthma     Ventolin as needed and QVAR takes daily  . History of bronchitis     last time 4-75yrs ago  . Headache(784.0)     migraines-last one about 64months ago;takes Topamax daily  . Weakness     numbness and tingling to left foot  . Joint swelling     right knee  . Chronic back pain     HNP  . Eczema     has 2 creams uses as needed  . GERD (gastroesophageal reflux disease)     takes Tums as needed  . Internal hemorrhoids   . History of colon polyps   . Urinary urgency   . Mental disorder     takes Abilify as needed  . Bipolar 1 disorder     takes DOxepin daily  . History of shingles    Past Surgical History  Procedure Laterality Date  . Wisdom tooth extraction    . Bunion removal    . Cholecystectomy  10 yrs ago  . Colonoscopy N/A 08/22/2012    Procedure: COLONOSCOPY;  Surgeon: Bernadene Person  Gloriann Loan, MD;   [facility]: [facility];  Service: Endoscopy;  Laterality: N/A;  100  . Tonsillectomy    . Cholecystectomy  10+yrs ago  . Tonsillectomy      as a child  . Bunionectomy Left     pins in big toe and 2nd toe  . Wisdom teeth extracted    . Epidural injections      x 2  . Colonoscopy    . Lumbar laminectomy/decompression microdiscectomy Left 07/03/2013    Procedure: LEFT LUMBAR THREE-FOUR microdiskectomy;  Surgeon: Winfield Cunas, MD;  Location: [place] [facility];  Service: Neurosurgery;  Laterality: Left;  LEFT LUMBAR THREE-FOUR microdiskectomy   Allergies  Allergen Reactions  . Bee Venom Anaphylaxis  . Dilaudid [Hydromorphone Hcl] Anaphylaxis and Hives  . Dilaudid [Hydromorphone Hcl] Anaphylaxis and Hives  . Latex Hives  . Senna Anaphylaxis and Hives  . Adhesive [Tape] Hives and Other (See Comments)    Pulls skin off (use paper tape)  . Latex Hives  . Adhesive [Tape] Rash  . Senna Hives and Rash       Objective:   Physical Exam  Filed Vitals:   07/17/13 1413  BP: 108/72  Pulse: 112  Temp: 98.4 F (36.9 C)  Height: 5\' 6"  (1.676 m)  Weight: 231 lb 12.8 oz (105.144 kg)   Alert and oriented. Skin warm and dry. Oral mucosa is moist.   . Sclera anicteric, conjunctivae is pink. Thyroid not enlarged. No cervical lymphadenopathy. Lungs clear. Heart regular rate and rhythm.  Abdomen is soft. Bowel sounds are positive. No hepatomegaly. No abdominal masses felt. No tenderness.  No edema to lower extremities.       Assessment:     Constipation which I feel is medication induced. She is on multiple constipating medications.  I discussed this case with Dr. Laural Golden. Hx of chronic constipation.  Recent colonoscopy in March of 2014.    Plan:       Lactulose syrup, Continue the Amitiza. Stool diary.  OV in 2 month

## 2013-08-12 ENCOUNTER — Other Ambulatory Visit: Payer: BC Managed Care – PPO | Admitting: Adult Health

## 2013-08-23 ENCOUNTER — Encounter: Payer: Self-pay | Admitting: Adult Health

## 2013-08-23 ENCOUNTER — Ambulatory Visit (INDEPENDENT_AMBULATORY_CARE_PROVIDER_SITE_OTHER): Payer: BC Managed Care – PPO | Admitting: Adult Health

## 2013-08-23 VITALS — BP 112/80 | HR 84 | Ht 65.5 in | Wt 225.0 lb

## 2013-08-23 DIAGNOSIS — Z3202 Encounter for pregnancy test, result negative: Secondary | ICD-10-CM

## 2013-08-23 DIAGNOSIS — Z01419 Encounter for gynecological examination (general) (routine) without abnormal findings: Secondary | ICD-10-CM

## 2013-08-23 DIAGNOSIS — Z309 Encounter for contraceptive management, unspecified: Secondary | ICD-10-CM

## 2013-08-23 DIAGNOSIS — B369 Superficial mycosis, unspecified: Secondary | ICD-10-CM

## 2013-08-23 HISTORY — DX: Encounter for contraceptive management, unspecified: Z30.9

## 2013-08-23 LAB — POCT URINE PREGNANCY: Preg Test, Ur: NEGATIVE

## 2013-08-23 MED ORDER — NYSTATIN 100000 UNIT/GM EX POWD: CUTANEOUS | Status: DC

## 2013-08-23 MED ORDER — NORETHINDRONE 0.35 MG PO TABS: 1.0000 | ORAL_TABLET | Freq: Every day | ORAL | Status: DC

## 2013-08-23 NOTE — Patient Instructions (Signed)
Physical in 1 year Use nystatin powder Call with any problems Yeast Infection of the Skin Some yeast on the skin is normal, but sometimes it causes an infection. If you have a yeast infection, it shows up as white or light brown patches on brown skin. You can see it better in the summer on tan skin. It causes light-colored holes in your suntan. It can happen on any area of the body. This cannot be passed from person to person. HOME CARE  Scrub your skin daily with a dandruff shampoo. Your rash may take a couple weeks to get well.  Do not scratch or itch the rash. GET HELP RIGHT AWAY IF:   You get another infection from scratching. The skin may get warm, red, and may ooze fluid.  The infection does not seem to be getting better. MAKE SURE YOU:  Understand these instructions.  Will watch your condition.  Will get help right away if you are not doing well or get worse. Document Released: 05/05/2008 Document Revised: 08/15/2011 Document Reviewed: 05/05/2008 [facility] Patient Information 2014 [place].

## 2013-08-23 NOTE — Progress Notes (Signed)
Patient ID: Sharon Vazquez, female   DOB: 21-Jun-1980, 33 y.o.   MRN: [id number] History of Present Illness: Sharon Vazquez is a 33 year old black female, married in for a physical, she had a normal pap with negative HPV 07/03/12.She is complaining of rash under breasts.She had back surgery in January.   Current Medications, Allergies, Past Medical History, Past Surgical History, Family History and Social History were reviewed in [employer] record.   Past Medical History  Diagnosis Date  . Hypertension   . Anxiety   . Bipolar 1 disorder   . ADHD (attention deficit hyperactivity disorder)   . Borderline personality disorder   . Panic attack   . Headache(784.0)   . Panic disorder   . HSV-2 (herpes simplex virus 2) infection   . Hx of chlamydia infection   . IBS (irritable bowel syndrome)   . Abnormal Pap smear   . Seizures   . Right ovarian cyst 01/22/2013  . Constipation     takes Dulcolax daily as needed takes Amitiza daily  . Anxiety     takes Xanax daily as needed  . Back spasm     takes Flexeril daily as needed  . Insomnia     takes Ambien nightly  . Hypertension     takes INderal and Clonidine daily  . Shortness of breath     with exertion  . Asthma     Ventolin as needed and QVAR takes daily  . History of bronchitis     last time 4-28yrs ago  . Headache(784.0)     migraines-last one about 22months ago;takes Topamax daily  . Weakness     numbness and tingling to left foot  . Joint swelling     right knee  . Chronic back pain     HNP  . Eczema     has 2 creams uses as needed  . GERD (gastroesophageal reflux disease)     takes Tums as needed  . Internal hemorrhoids   . History of colon polyps   . Urinary urgency   . Mental disorder     takes Abilify as needed  . Bipolar 1 disorder     takes DOxepin daily  . History of shingles   . Obesity   . Contraceptive management 08/23/2013   Past Surgical History  Procedure Laterality Date  .  Wisdom tooth extraction    . Bunion removal    . Cholecystectomy  10 yrs ago  . Colonoscopy N/A 08/22/2012    Procedure: COLONOSCOPY;  Surgeon: Rogene Houston, MD;  [facility]: [facility];  Service: Endoscopy;  Laterality: N/A;  100  . Tonsillectomy    . Cholecystectomy  10+yrs ago  . Tonsillectomy      as a child  . Bunionectomy Left     pins in big toe and 2nd toe  . Wisdom teeth extracted    . Epidural injections      x 2  . Colonoscopy    . Lumbar laminectomy/decompression microdiscectomy Left 07/03/2013    Procedure: LEFT LUMBAR THREE-FOUR microdiskectomy;  Surgeon: Winfield Cunas, MD;  Location: [place] [facility];  Service: Neurosurgery;  Laterality: Left;  LEFT LUMBAR THREE-FOUR microdiskectomy  Current outpatient prescriptions:albuterol (PROVENTIL HFA;VENTOLIN HFA) 108 (90 BASE) MCG/ACT inhaler, Inhale 2 puffs into the lungs 2 (two) times daily as needed for wheezing or shortness of breath., Disp: , Rfl: ;  alprazolam (XANAX) 2 MG tablet, Take 2 mg by mouth 3 (three) times  daily as needed for anxiety (panic attacks)., Disp: , Rfl: ;  ARIPiprazole (ABILIFY) 5 MG tablet, Take 2.5 mg by mouth daily., Disp: , Rfl:  beclomethasone (QVAR) 80 MCG/ACT inhaler, Inhale 2 puffs into the lungs 2 (two) times daily. , Disp: , Rfl: ;  bisacodyl (DULCOLAX) 5 MG EC tablet, Take 5-15 mg by mouth daily as needed for moderate constipation., Disp: , Rfl: ;  cetirizine (ZYRTEC) 10 MG tablet, Take 10 mg by mouth daily., Disp: , Rfl: ;  chlorhexidine (PERIDEX) 0.12 % solution, Use as directed 15 mLs in the mouth or throat 2 (two) times daily., Disp: , Rfl:  clobetasol cream (TEMOVATE) 2.42 %, Apply 1 application topically 2 (two) times daily as needed (rash)., Disp: , Rfl: ;  cloNIDine (CATAPRES) 0.1 MG tablet, Take 0.1 mg by mouth 2 (two) times daily., Disp: , Rfl: ;  cyclobenzaprine (FLEXERIL) 10 MG tablet, Take 1 tablet (10 mg total) by mouth 3 (three) times daily as needed for muscle spasms., Disp: 45 tablet, Rfl:  0 desonide (DESOWEN) 0.05 % cream, Apply 1 application topically 2 (two) times daily as needed (rash)., Disp: , Rfl: ;  doxepin (SINEQUAN) 25 MG capsule, Take 50 mg by mouth 2 (two) times daily. , Disp: , Rfl: ;  EPINEPHrine (EPIPEN) 0.3 mg/0.3 mL SOAJ injection, Inject 0.3 mg into the muscle as needed (allergic reaction)., Disp: , Rfl: ;  fluticasone (FLONASE) 50 MCG/ACT nasal spray, Place 1 spray into the nose daily., Disp: , Rfl:  gabapentin (NEURONTIN) 300 MG capsule, Take 300 mg by mouth 3 (three) times daily. , Disp: , Rfl: ;  HYDROcodone-acetaminophen (NORCO/VICODIN) 5-325 MG per tablet, Take 1 tablet by mouth every 6 (six) hours as needed for moderate pain., Disp: , Rfl: ;  lactulose (CHRONULAC) 10 GM/15ML solution, Take 15 mLs (10 g total) by mouth 3 (three) times daily., Disp: 240 mL, Rfl: 0 lubiprostone (AMITIZA) 24 MCG capsule, Take 24 mcg by mouth 2 (two) times daily with a meal., Disp: , Rfl: ;  methocarbamol (ROBAXIN) 750 MG tablet, Take 750 mg by mouth every 6 (six) hours as needed. , Disp: , Rfl: ;  norethindrone (MICRONOR,CAMILA,ERRIN) 0.35 MG tablet, Take 1 tablet (0.35 mg total) by mouth daily., Disp: 1 Package, Rfl: 11 nystatin-triamcinolone (MYCOLOG II) cream, Apply topically 2 (two) times daily. Apply cream to anal canal twice daily for 2 weeks and then as needed., Disp: 60 g, Rfl: 0;  Olopatadine HCl (PATADAY) 0.2 % SOLN, Place 1 drop into both eyes daily as needed (itchy eyes)., Disp: , Rfl: ;  propranolol (INDERAL) 10 MG tablet, Take 10 mg by mouth 3 (three) times daily., Disp: , Rfl:  topiramate (TOPAMAX) 25 MG tablet, Take 25-50 mg by mouth 2 (two) times daily. Take 1 tablet (25 mg) every morning and 2 tablets (50 mg) every night, Disp: , Rfl: ;  zolpidem (AMBIEN) 10 MG tablet, Take 10 mg by mouth at bedtime., Disp: , Rfl: ;  nystatin (MYCOSTATIN/NYSTOP) 100000 UNIT/GM POWD, Use powder 2-3 times daily to affected area prn,, Disp: 30 g, Rfl: prn  Review of Systems: Patient denies  any headaches, blurred vision, shortness of breath, chest pain, problems with bowel movements, urination, or intercourse. She says her stomach stays sore, has IBS and is on meds, sees Dr Laural Golden.Has pain left hip and leg, moods good at present.She has lost about 30 lbs she says.    Physical Exam:BP 112/80  Pulse 84  Ht 5' 5.5" (1.664 m)  Wt 225 lb (102.059  kg)  BMI 36.86 kg/m2  LMP 03/05/2015UPT negative(pt request) General:  Well developed, well nourished, no acute distress Skin:  Warm and dry Neck:  Midline trachea, normal thyroid Lungs; Clear to auscultation bilaterally Breast:  No dominant palpable mass, retraction, or nipple discharge,has skin fungus under breast L>R Cardiovascular: Regular rate and rhythm Abdomen:  Soft, mildly tender over entirety. no hepatosplenomegaly.obese Pelvic:  External genitalia is normal in appearance.  The vagina is normal in appearance.  The cervix is smooth.  Uterus is felt to be normal size, shape, and contour.  No adnexal masses, has RLQ tenderness noted on palpation but not before has history of right ovarian cyst, call if she starts to have pain there Extremities:  No swelling or varicosities noted, has well healed incision on lower back Psych:  No mood changes, alert and cooperative, seems to be happy   Impression: Yearly gyn exam no pap Contraceptive management Skin fungus    Plan: Refilled micronor x 1 year Rx nystatin powder to use 2-3 x daily prn under breast Physical in 1 year Call with any problems

## 2013-09-03 ENCOUNTER — Ambulatory Visit (INDEPENDENT_AMBULATORY_CARE_PROVIDER_SITE_OTHER): Payer: BC Managed Care – PPO | Admitting: Internal Medicine

## 2013-09-03 ENCOUNTER — Encounter (INDEPENDENT_AMBULATORY_CARE_PROVIDER_SITE_OTHER): Payer: Self-pay | Admitting: Internal Medicine

## 2013-09-03 VITALS — BP 130/80 | HR 100 | Temp 98.1°F | Resp 20 | Ht 66.0 in | Wt 223.2 lb

## 2013-09-03 DIAGNOSIS — R1030 Lower abdominal pain, unspecified: Secondary | ICD-10-CM

## 2013-09-03 DIAGNOSIS — K5900 Constipation, unspecified: Secondary | ICD-10-CM

## 2013-09-03 DIAGNOSIS — R109 Unspecified abdominal pain: Secondary | ICD-10-CM

## 2013-09-03 MED ORDER — PSYLLIUM 28 % PO PACK: 1.0000 | PACK | Freq: Two times a day (BID) | ORAL | Status: DC

## 2013-09-03 MED ORDER — POLYETHYLENE GLYCOL 3350 17 GM/SCOOP PO POWD: 17.0000 g | Freq: Every day | ORAL | Status: DC

## 2013-09-03 NOTE — Patient Instructions (Signed)
Referral to [facility] for intractable constipation to be arranged.

## 2013-09-03 NOTE — Progress Notes (Signed)
Presenting complaint;  Followup for lower abdominal pain and constipation.  Subjective:  Patient is 33 year old African female who is in for scheduled visit. She was last seen on 07/17/2013 by Ms. Setzer, NP. She remains with constipation. She states she had Stools aren't but forgot to bring it. She says her bowels move 2-3 times a week. She rarely has good evacuation. She continues to complain of bloating and lower abdominal pain. Occasionally she has diarrhea. She is also using a fleets enema every third day as needed. She remains with intermittent nausea but no vomiting. She remains with low back pain. She had lumbar spine surgery for disc prolapse at L3 and L4 and remained for back pain also complains of numbness and weakness in left lower extremity and is using cane to move around. She is willing to go to [facility] for consultation. Patient's last colonoscopy was on 08/22/2012 with removal of 10 mm polyp which was sessile serrated adenoma with dysplasia.   Current Medications: Outpatient Encounter Prescriptions as of 09/03/2013  Medication Sig  . albuterol (PROVENTIL HFA;VENTOLIN HFA) 108 (90 BASE) MCG/ACT inhaler Inhale 2 puffs into the lungs 2 (two) times daily as needed for wheezing or shortness of breath.  . alprazolam (XANAX) 2 MG tablet Take 2 mg by mouth 3 (three) times daily as needed for anxiety (panic attacks).  . ARIPiprazole (ABILIFY) 5 MG tablet Take 2.5 mg by mouth daily.  . beclomethasone (QVAR) 80 MCG/ACT inhaler Inhale 2 puffs into the lungs 2 (two) times daily.   . bisacodyl (DULCOLAX) 5 MG EC tablet Take 5-15 mg by mouth daily as needed for moderate constipation.  . cetirizine (ZYRTEC) 10 MG tablet Take 10 mg by mouth daily.  . chlorhexidine (PERIDEX) 0.12 % solution Use as directed 15 mLs in the mouth or throat 2 (two) times daily.  . clobetasol cream (TEMOVATE) 6.23 % Apply 1 application topically 2 (two) times daily as needed (rash).  . cloNIDine (CATAPRES) 0.1  MG tablet Take 0.1 mg by mouth 2 (two) times daily.  Marland Kitchen desonide (DESOWEN) 0.05 % cream Apply 1 application topically 2 (two) times daily as needed (rash).  Marland Kitchen doxepin (SINEQUAN) 25 MG capsule Take 50 mg by mouth 2 (two) times daily.   Marland Kitchen EPINEPHrine (EPIPEN) 0.3 mg/0.3 mL SOAJ injection Inject 0.3 mg into the muscle as needed (allergic reaction).  . fluticasone (FLONASE) 50 MCG/ACT nasal spray Place 1 spray into the nose daily.  Marland Kitchen gabapentin (NEURONTIN) 300 MG capsule Take 300 mg by mouth 3 (three) times daily.   Marland Kitchen HYDROcodone-acetaminophen (NORCO/VICODIN) 5-325 MG per tablet Take 1 tablet by mouth every 6 (six) hours as needed for moderate pain.  Marland Kitchen lactulose (CHRONULAC) 10 GM/15ML solution Take 15 mLs (10 g total) by mouth 3 (three) times daily.  Marland Kitchen lubiprostone (AMITIZA) 24 MCG capsule Take 24 mcg by mouth 2 (two) times daily with a meal.  . norethindrone (MICRONOR,CAMILA,ERRIN) 0.35 MG tablet Take 1 tablet (0.35 mg total) by mouth daily.  Marland Kitchen nystatin (MYCOSTATIN/NYSTOP) 100000 UNIT/GM POWD Use powder 2-3 times daily to affected area prn,  . nystatin-triamcinolone (MYCOLOG II) cream Apply topically 2 (two) times daily. Apply cream to anal canal twice daily for 2 weeks and then as needed.  . Olopatadine HCl (PATADAY) 0.2 % SOLN Place 1 drop into both eyes daily as needed (itchy eyes).  . propranolol (INDERAL) 10 MG tablet Take 10 mg by mouth 3 (three) times daily.  Marland Kitchen topiramate (TOPAMAX) 25 MG tablet Take 25-50 mg by  mouth 2 (two) times daily. Take 1 tablet (25 mg) every morning and 2 tablets (50 mg) every night  . zolpidem (AMBIEN) 10 MG tablet Take 10 mg by mouth at bedtime.  . [DISCONTINUED] cyclobenzaprine (FLEXERIL) 10 MG tablet Take 1 tablet (10 mg total) by mouth 3 (three) times daily as needed for muscle spasms.  . [DISCONTINUED] methocarbamol (ROBAXIN) 750 MG tablet Take 750 mg by mouth every 6 (six) hours as needed.      Objective: Blood pressure 130/80, pulse 100, temperature 98.1 F  (36.7 C), temperature source Oral, resp. rate 20, height 5\' 6"  (1.676 m), weight 223 lb 3.2 oz (101.243 kg), last menstrual period 08/08/2013. Patient is alert and in no acute distress. She was able to move from chair to examination table with assistance by Ms. Todd Conjunctiva is pink. Sclera is nonicteric Oropharyngeal mucosa is normal. No neck masses or thyromegaly noted. Cardiac exam with regular rhythm normal S1 and S2. No murmur or gallop noted. Lungs are clear to auscultation. Abdomen is full. Bowel sounds are normal. Abdomen is soft with mild tenderness at the hypogastric region and RUQ. No organomegaly or masses noted.  No LE edema or clubbing noted.    Assessment:  #1. Chronic constipation felt to be secondary to medications unresponsive to therapy. She is presently on Amitiza and Lactulose. Lactulose while not helping may be untreated into bloating. She may benefit from Relistor. #2. Chronic lower abdominal pain due to IBS. #3. History of sessile serrated adenoma. Last colonoscopy was one year ago.    Plan:  Discontinue lactulose. Go back on MiraLax 17 g by mouth daily. Take Metamucil 3 to 4 g by mouth twice a day. Patient not interested in trial with Relistor. Referral to [facility] for further evaluation. Office visit in 6 months.

## 2013-10-15 ENCOUNTER — Other Ambulatory Visit: Payer: Self-pay | Admitting: Pediatrics

## 2013-10-15 ENCOUNTER — Ambulatory Visit: Payer: BC Managed Care – PPO | Admitting: Podiatry

## 2013-10-15 ENCOUNTER — Other Ambulatory Visit (HOSPITAL_COMMUNITY): Payer: Self-pay | Admitting: Neurosurgery

## 2013-10-15 ENCOUNTER — Other Ambulatory Visit: Payer: Self-pay | Admitting: Neurosurgery

## 2013-10-15 DIAGNOSIS — M542 Cervicalgia: Secondary | ICD-10-CM

## 2013-10-25 ENCOUNTER — Encounter (HOSPITAL_COMMUNITY): Payer: Self-pay | Admitting: Pharmacy Technician

## 2013-11-01 ENCOUNTER — Ambulatory Visit (HOSPITAL_COMMUNITY)
Admission: RE | Admit: 2013-11-01 | Discharge: 2013-11-01 | Disposition: A | Discharge status: Home / Self Care | Payer: BC Managed Care – PPO | Source: Ambulatory Visit | Attending: Neurosurgery | Admitting: Neurosurgery

## 2013-11-01 VITALS — BP 127/82 | HR 78 | Temp 97.8°F | Resp 18 | Ht 66.0 in | Wt 223.0 lb

## 2013-11-01 DIAGNOSIS — M545 Low back pain, unspecified: Secondary | ICD-10-CM | POA: Insufficient documentation

## 2013-11-01 DIAGNOSIS — M48061 Spinal stenosis, lumbar region without neurogenic claudication: Secondary | ICD-10-CM | POA: Insufficient documentation

## 2013-11-01 DIAGNOSIS — M5020 Other cervical disc displacement, unspecified cervical region: Secondary | ICD-10-CM | POA: Insufficient documentation

## 2013-11-01 DIAGNOSIS — M542 Cervicalgia: Secondary | ICD-10-CM

## 2013-11-01 DIAGNOSIS — M5126 Other intervertebral disc displacement, lumbar region: Secondary | ICD-10-CM | POA: Diagnosis present

## 2013-11-01 MED ORDER — DIAZEPAM 5 MG PO TABS
ORAL_TABLET | ORAL | Status: AC
  Filled 2013-11-01: qty 1

## 2013-11-01 MED ORDER — DIAZEPAM 5 MG PO TABS
5.0000 mg | ORAL_TABLET | Freq: Once | ORAL | Status: AC
  Administered 2013-11-01: 5 mg via ORAL

## 2013-11-01 MED ORDER — IOHEXOL 300 MG/ML  SOLN
10.0000 mL | Freq: Once | INTRAMUSCULAR | Status: AC | PRN
  Administered 2013-11-01: 10 mL via INTRATHECAL

## 2013-11-01 MED ORDER — OXYCODONE HCL 5 MG PO TABS
5.0000 mg | ORAL_TABLET | ORAL | Status: DC | PRN
  Administered 2013-11-01: 5 mg via ORAL
  Filled 2013-11-01: qty 1

## 2013-11-01 MED ORDER — ONDANSETRON HCL 4 MG/2ML IJ SOLN: 4.0000 mg | Freq: Four times a day (QID) | INTRAMUSCULAR | Status: DC | PRN

## 2013-11-01 NOTE — Op Note (Signed)
*   No surgery found * lumbar Myelogram  PATIENT:  Sharon Vazquez is a 33 y.o. female  PRE-OPERATIVE DIAGNOSIS:  Lumbago, and cervicalgia  POST-OPERATIVE DIAGNOSIS:  sam3  PROCEDURE:  Lumbar Myelogram  SURGEON:  Joshua Soulier  ANESTHESIA:   local LOCAL MEDICATIONS USED:  LIDOCAINE  and Amount: 4 ml Procedure Note: [name] was taken to the fluoroscopy suite, positioned on the fluoroscopy table prone. Her back was prepped and draped in a sterile manner. I infiltrated lidocaine into the lumbar area, then with xray guidance placed a spinal needle into the thecal sac at L3/4. I infused intrathecal contrast omnipaque 300 10cc's into the lumbar sac. I confirmed the dye placement with fluoroscopy then removed the needle.    PATIENT DISPOSITION:  PACU - hemodynamically stable.

## 2013-11-01 NOTE — Discharge Instructions (Signed)
Myelography °Care After °These instructions give you information on caring for yourself after your procedure. Your doctor may also give you specific instructions. Call your doctor if you have any problems or questions after your procedure. °HOME CARE °· Rest often the first day. °· When you rest, lie flat, with your head slightly raised (elevated). °· Avoid heavy lifting and activity for 48 hours. °· You may take the bandage (dressing) off 1 day after the test. °GET HELP RIGHT AWAY IF:  °· You have a very bad headache. °· You have a fever. °MAKE SURE YOU: °· Understand these instructions. °· Will watch your condition. °· Will get help right away if you are not doing well or get worse. °Document Released: 03/01/2008 Document Revised: 05/09/2012 Document Reviewed: 02/15/2012 °[employer]® Patient Information ©2014 [employer]. ° °

## 2013-11-05 DIAGNOSIS — F319 Bipolar disorder, unspecified: Secondary | ICD-10-CM | POA: Insufficient documentation

## 2013-11-05 DIAGNOSIS — G894 Chronic pain syndrome: Secondary | ICD-10-CM | POA: Insufficient documentation

## 2013-11-05 DIAGNOSIS — R109 Unspecified abdominal pain: Secondary | ICD-10-CM | POA: Insufficient documentation

## 2013-11-14 ENCOUNTER — Other Ambulatory Visit: Payer: Self-pay | Admitting: Neurosurgery

## 2013-11-21 NOTE — Pre-Procedure Instructions (Signed)
Sharon Vazquez  11/21/2013   Your procedure is scheduled on:  11-29-2013    Friday   Report to [facility] Admitting at 12:15 pm   Call this number if you have problems the morning of surgery: [phone]   Remember:   Do not eat food or drink liquids after midnight.  Take these medicines the morning of surgery with A SIP OF WATER:               Albuterol inhaler if needed              Abilify              Quvar inhaler              Zyrtec              Clonidine(Catapres)              Doxepin (Sinequan)              Gabapentin(Neurotin)               Pain medication as needed               Norethindrone(Heather)     Do not wear jewelry, make-up or nail polish.  Do not wear lotions, powders, or perfumes.   Do not shave 48 hours prior to surgery.   Do not bring valuables to the hospital.  [facility] is not responsibe  for any belongings or valuables.               Contacts, dentures or bridgework may not be worn into surgery.   Leave suitcase in the car. After surgery it may be brought to your room.  For patients admitted to the hospital, discharge time is determined by your  treatment team.               Patients discharged the day of surgery will not be allowed to drive home.   Name and phone number of your driver:    Special Instructions: See attached sheet for instructions on CHG Shower/bath   Please read over the following fact sheets that you were given: Pain Booklet, Coughing and Deep Breathing and Surgical Site Infection Prevention

## 2013-11-22 ENCOUNTER — Encounter (HOSPITAL_COMMUNITY)
Admission: RE | Admit: 2013-11-22 | Discharge: 2013-11-22 | Disposition: A | Discharge status: Home / Self Care | Payer: BC Managed Care – PPO | Source: Ambulatory Visit | Attending: Neurosurgery | Admitting: Neurosurgery

## 2013-11-22 ENCOUNTER — Encounter (HOSPITAL_COMMUNITY): Payer: Self-pay

## 2013-11-22 DIAGNOSIS — Z01812 Encounter for preprocedural laboratory examination: Secondary | ICD-10-CM | POA: Insufficient documentation

## 2013-11-22 LAB — BASIC METABOLIC PANEL
BUN: 5 mg/dL — ABNORMAL LOW (ref 6–23)
CALCIUM: 8.9 mg/dL (ref 8.4–10.5)
CHLORIDE: 104 meq/L (ref 96–112)
CO2: 22 meq/L (ref 19–32)
CREATININE: 0.95 mg/dL (ref 0.50–1.10)
GFR calc Af Amer: >90 mL/min (ref >90)
GFR calc non Af Amer: 78 mL/min — ABNORMAL LOW (ref >90)
Glucose, Bld: 97 mg/dL (ref 70–99)
Potassium: 3.8 mEq/L (ref 3.7–5.3)
Sodium: 140 mEq/L (ref 137–147)

## 2013-11-22 LAB — CBC
HEMATOCRIT: 38.3 % (ref 36.0–46.0)
Hemoglobin: 13.4 g/dL (ref 12.0–15.0)
MCH: 31 pg (ref 26.0–34.0)
MCHC: 35 g/dL (ref 30.0–36.0)
MCV: 88.7 fL (ref 78.0–100.0)
PLATELETS: 366 10*3/uL (ref 150–400)
RBC: 4.32 MIL/uL (ref 3.87–5.11)
RDW: 13.4 % (ref 11.5–15.5)
WBC: 10.4 10*3/uL (ref 4.0–10.5)

## 2013-11-22 LAB — SURGICAL PCR SCREEN
MRSA, PCR: NEGATIVE
STAPHYLOCOCCUS AUREUS: NEGATIVE

## 2013-11-22 LAB — HCG, SERUM, QUALITATIVE: Preg, Serum: NEGATIVE

## 2013-11-28 MED ORDER — CEFAZOLIN SODIUM-DEXTROSE 2-3 GM-% IV SOLR
2.0000 g | INTRAVENOUS | Status: AC
  Administered 2013-11-29: 2 g via INTRAVENOUS
  Filled 2013-11-28: qty 50

## 2013-11-29 ENCOUNTER — Ambulatory Visit (HOSPITAL_COMMUNITY): Payer: BC Managed Care – PPO

## 2013-11-29 ENCOUNTER — Ambulatory Visit (HOSPITAL_COMMUNITY)
Admission: RE | Admit: 2013-11-29 | Discharge: 2013-11-30 | Disposition: A | Discharge status: Home / Self Care | Payer: BC Managed Care – PPO | Source: Ambulatory Visit | Attending: Neurosurgery | Admitting: Neurosurgery

## 2013-11-29 ENCOUNTER — Encounter (HOSPITAL_COMMUNITY): Payer: BC Managed Care – PPO | Admitting: Certified Registered Nurse Anesthetist

## 2013-11-29 ENCOUNTER — Encounter (HOSPITAL_COMMUNITY): Payer: Self-pay | Admitting: *Deleted

## 2013-11-29 ENCOUNTER — Ambulatory Visit (HOSPITAL_COMMUNITY): Payer: BC Managed Care – PPO | Admitting: Certified Registered Nurse Anesthetist

## 2013-11-29 ENCOUNTER — Encounter (HOSPITAL_COMMUNITY)
Admission: RE | Disposition: A | Discharge status: Home / Self Care | Payer: Self-pay | Source: Ambulatory Visit | Attending: Neurosurgery

## 2013-11-29 DIAGNOSIS — I10 Essential (primary) hypertension: Secondary | ICD-10-CM | POA: Insufficient documentation

## 2013-11-29 DIAGNOSIS — K219 Gastro-esophageal reflux disease without esophagitis: Secondary | ICD-10-CM | POA: Insufficient documentation

## 2013-11-29 DIAGNOSIS — J45909 Unspecified asthma, uncomplicated: Secondary | ICD-10-CM | POA: Insufficient documentation

## 2013-11-29 DIAGNOSIS — F411 Generalized anxiety disorder: Secondary | ICD-10-CM | POA: Insufficient documentation

## 2013-11-29 DIAGNOSIS — F410 Panic disorder [episodic paroxysmal anxiety] without agoraphobia: Secondary | ICD-10-CM | POA: Insufficient documentation

## 2013-11-29 DIAGNOSIS — M5126 Other intervertebral disc displacement, lumbar region: Secondary | ICD-10-CM | POA: Insufficient documentation

## 2013-11-29 DIAGNOSIS — Z87891 Personal history of nicotine dependence: Secondary | ICD-10-CM | POA: Insufficient documentation

## 2013-11-29 DIAGNOSIS — F319 Bipolar disorder, unspecified: Secondary | ICD-10-CM | POA: Insufficient documentation

## 2013-11-29 DIAGNOSIS — E669 Obesity, unspecified: Secondary | ICD-10-CM | POA: Insufficient documentation

## 2013-11-29 DIAGNOSIS — G4700 Insomnia, unspecified: Secondary | ICD-10-CM | POA: Insufficient documentation

## 2013-11-29 DIAGNOSIS — F909 Attention-deficit hyperactivity disorder, unspecified type: Secondary | ICD-10-CM | POA: Insufficient documentation

## 2013-11-29 DIAGNOSIS — F603 Borderline personality disorder: Secondary | ICD-10-CM | POA: Insufficient documentation

## 2013-11-29 DIAGNOSIS — Z860100 Personal history of colon polyps, unspecified: Secondary | ICD-10-CM | POA: Insufficient documentation

## 2013-11-29 HISTORY — PX: LUMBAR LAMINECTOMY/DECOMPRESSION MICRODISCECTOMY: SHX5026

## 2013-11-29 SURGERY — LUMBAR LAMINECTOMY/DECOMPRESSION MICRODISCECTOMY 1 LEVEL
Anesthesia: General | Laterality: Left

## 2013-11-29 MED ORDER — ROCURONIUM BROMIDE 100 MG/10ML IV SOLN
INTRAVENOUS | Status: DC | PRN
  Administered 2013-11-29: 50 mg via INTRAVENOUS

## 2013-11-29 MED ORDER — HYDROCORTISONE 2.5 % EX CREA: 1.0000 "application " | TOPICAL_CREAM | Freq: Three times a day (TID) | CUTANEOUS | Status: DC

## 2013-11-29 MED ORDER — DIAZEPAM 5 MG PO TABS
5.0000 mg | ORAL_TABLET | Freq: Four times a day (QID) | ORAL | Status: DC | PRN
  Administered 2013-11-29 – 2013-11-30 (×2): 5 mg via ORAL
  Filled 2013-11-29 (×2): qty 1

## 2013-11-29 MED ORDER — ALBUTEROL SULFATE HFA 108 (90 BASE) MCG/ACT IN AERS: 2.0000 | INHALATION_SPRAY | RESPIRATORY_TRACT | Status: DC | PRN

## 2013-11-29 MED ORDER — SODIUM CHLORIDE 0.9 % IJ SOLN: 3.0000 mL | INTRAMUSCULAR | Status: DC | PRN

## 2013-11-29 MED ORDER — HEMOSTATIC AGENTS (NO CHARGE) OPTIME
TOPICAL | Status: DC | PRN
  Administered 2013-11-29: 1 via TOPICAL

## 2013-11-29 MED ORDER — OXYCODONE-ACETAMINOPHEN 5-325 MG PO TABS
1.0000 | ORAL_TABLET | ORAL | Status: DC | PRN
  Administered 2013-11-29 – 2013-11-30 (×2): 2 via ORAL
  Filled 2013-11-29 (×2): qty 2

## 2013-11-29 MED ORDER — DICLOFENAC POTASSIUM(MIGRAINE) 50 MG PO PACK: 50.0000 mg | PACK | Freq: Every day | ORAL | Status: DC | PRN

## 2013-11-29 MED ORDER — BUPIVACAINE HCL (PF) 0.5 % IJ SOLN
INTRAMUSCULAR | Status: DC | PRN
Start: 2013-11-29 — End: 2013-11-29
  Administered 2013-11-29: 10 mL

## 2013-11-29 MED ORDER — GABAPENTIN 300 MG PO CAPS
300.0000 mg | ORAL_CAPSULE | Freq: Three times a day (TID) | ORAL | Status: DC
  Administered 2013-11-29 – 2013-11-30 (×2): 300 mg via ORAL
  Filled 2013-11-29 (×4): qty 1

## 2013-11-29 MED ORDER — ARIPIPRAZOLE 10 MG PO TABS
10.0000 mg | ORAL_TABLET | Freq: Every day | ORAL | Status: DC
  Administered 2013-11-30: 10 mg via ORAL
  Filled 2013-11-29: qty 1

## 2013-11-29 MED ORDER — NEOSTIGMINE METHYLSULFATE 10 MG/10ML IV SOLN
INTRAVENOUS | Status: DC | PRN
  Administered 2013-11-29: 3 mg via INTRAVENOUS

## 2013-11-29 MED ORDER — ARTIFICIAL TEARS OP OINT
TOPICAL_OINTMENT | OPHTHALMIC | Status: DC | PRN
  Administered 2013-11-29: 1 via OPHTHALMIC

## 2013-11-29 MED ORDER — FENTANYL CITRATE 0.05 MG/ML IJ SOLN
25.0000 ug | INTRAMUSCULAR | Status: DC | PRN
Start: 2013-11-29 — End: 2013-11-29
  Administered 2013-11-29 (×2): 25 ug via INTRAVENOUS

## 2013-11-29 MED ORDER — FENTANYL CITRATE 0.05 MG/ML IJ SOLN
INTRAMUSCULAR | Status: DC | PRN
  Administered 2013-11-29: 50 ug via INTRAVENOUS
  Administered 2013-11-29 (×2): 100 ug via INTRAVENOUS

## 2013-11-29 MED ORDER — ZOLPIDEM TARTRATE 5 MG PO TABS
5.0000 mg | ORAL_TABLET | Freq: Every day | ORAL | Status: DC
  Administered 2013-11-29: 5 mg via ORAL
  Filled 2013-11-29: qty 1

## 2013-11-29 MED ORDER — TERBINAFINE HCL 250 MG PO TABS
250.0000 mg | ORAL_TABLET | Freq: Every day | ORAL | Status: DC
  Administered 2013-11-30: 250 mg via ORAL
  Filled 2013-11-29: qty 1

## 2013-11-29 MED ORDER — SENNA 8.6 MG PO TABS
1.0000 | ORAL_TABLET | Freq: Two times a day (BID) | ORAL | Status: DC
  Administered 2013-11-29 – 2013-11-30 (×2): 8.6 mg via ORAL
  Filled 2013-11-29 (×3): qty 1

## 2013-11-29 MED ORDER — LACTATED RINGERS IV SOLN
INTRAVENOUS | Status: DC | PRN
  Administered 2013-11-29 (×2): via INTRAVENOUS

## 2013-11-29 MED ORDER — LIDOCAINE HCL (CARDIAC) 20 MG/ML IV SOLN
INTRAVENOUS | Status: AC
  Filled 2013-11-29: qty 5

## 2013-11-29 MED ORDER — PHENOL 1.4 % MT LIQD: 1.0000 | OROMUCOSAL | Status: DC | PRN

## 2013-11-29 MED ORDER — POLYETHYLENE GLYCOL 3350 17 G PO PACK
17.0000 g | PACK | Freq: Every day | ORAL | Status: DC | PRN
  Filled 2013-11-29: qty 1

## 2013-11-29 MED ORDER — POTASSIUM CHLORIDE IN NACL 20-0.9 MEQ/L-% IV SOLN
INTRAVENOUS | Status: DC
  Filled 2013-11-29 (×3): qty 1000

## 2013-11-29 MED ORDER — DIPHENHYDRAMINE HCL 25 MG PO TABS: 50.0000 mg | ORAL_TABLET | Freq: Four times a day (QID) | ORAL | Status: DC | PRN

## 2013-11-29 MED ORDER — ARTIFICIAL TEARS OP OINT
TOPICAL_OINTMENT | OPHTHALMIC | Status: AC
  Filled 2013-11-29: qty 3.5

## 2013-11-29 MED ORDER — FLUTICASONE PROPIONATE HFA 44 MCG/ACT IN AERO
2.0000 | INHALATION_SPRAY | Freq: Two times a day (BID) | RESPIRATORY_TRACT | Status: DC
  Administered 2013-11-29 – 2013-11-30 (×2): 2 via RESPIRATORY_TRACT
  Filled 2013-11-29: qty 10.6

## 2013-11-29 MED ORDER — HYDROCODONE-ACETAMINOPHEN 5-325 MG PO TABS: 1.0000 | ORAL_TABLET | Freq: Four times a day (QID) | ORAL | Status: DC | PRN

## 2013-11-29 MED ORDER — MENTHOL 3 MG MT LOZG: 1.0000 | LOZENGE | OROMUCOSAL | Status: DC | PRN

## 2013-11-29 MED ORDER — MIDAZOLAM HCL 2 MG/2ML IJ SOLN
INTRAMUSCULAR | Status: AC
  Filled 2013-11-29: qty 2

## 2013-11-29 MED ORDER — PANTOPRAZOLE SODIUM 40 MG PO TBEC
40.0000 mg | DELAYED_RELEASE_TABLET | Freq: Every day | ORAL | Status: DC
  Administered 2013-11-30: 40 mg via ORAL
  Filled 2013-11-29: qty 1

## 2013-11-29 MED ORDER — FLUTICASONE PROPIONATE 50 MCG/ACT NA SUSP
1.0000 | Freq: Every day | NASAL | Status: DC
  Filled 2013-11-29: qty 16

## 2013-11-29 MED ORDER — KETOROLAC TROMETHAMINE 30 MG/ML IJ SOLN
30.0000 mg | Freq: Four times a day (QID) | INTRAMUSCULAR | Status: DC
  Administered 2013-11-29 – 2013-11-30 (×2): 30 mg via INTRAVENOUS
  Filled 2013-11-29 (×6): qty 1

## 2013-11-29 MED ORDER — ONDANSETRON HCL 4 MG/2ML IJ SOLN
INTRAMUSCULAR | Status: DC | PRN
Start: 2013-11-29 — End: 2013-11-29
  Administered 2013-11-29: 4 mg via INTRAVENOUS

## 2013-11-29 MED ORDER — ROCURONIUM BROMIDE 50 MG/5ML IV SOLN
INTRAVENOUS | Status: AC
  Filled 2013-11-29: qty 1

## 2013-11-29 MED ORDER — LUBIPROSTONE 24 MCG PO CAPS
24.0000 ug | ORAL_CAPSULE | Freq: Two times a day (BID) | ORAL | Status: DC
  Administered 2013-11-29 – 2013-11-30 (×2): 24 ug via ORAL
  Filled 2013-11-29 (×4): qty 1

## 2013-11-29 MED ORDER — ALPRAZOLAM 0.5 MG PO TABS
2.0000 mg | ORAL_TABLET | Freq: Three times a day (TID) | ORAL | Status: DC | PRN
  Administered 2013-11-29 – 2013-11-30 (×2): 2 mg via ORAL
  Filled 2013-11-29 (×2): qty 4

## 2013-11-29 MED ORDER — PROPOFOL 10 MG/ML IV BOLUS
INTRAVENOUS | Status: DC | PRN
  Administered 2013-11-29: 200 mg via INTRAVENOUS

## 2013-11-29 MED ORDER — GLYCOPYRROLATE 0.2 MG/ML IJ SOLN
INTRAMUSCULAR | Status: DC | PRN
  Administered 2013-11-29: 0.4 mg via INTRAVENOUS

## 2013-11-29 MED ORDER — NYSTATIN-TRIAMCINOLONE 100000-0.1 UNIT/GM-% EX CREA: 1.0000 "application " | TOPICAL_CREAM | Freq: Two times a day (BID) | CUTANEOUS | Status: DC | PRN

## 2013-11-29 MED ORDER — ONDANSETRON HCL 4 MG/2ML IJ SOLN
INTRAMUSCULAR | Status: AC
  Filled 2013-11-29: qty 2

## 2013-11-29 MED ORDER — NYSTATIN 100000 UNIT/GM EX POWD
Freq: Two times a day (BID) | CUTANEOUS | Status: DC
  Administered 2013-11-29: 22:00:00 via TOPICAL
  Filled 2013-11-29: qty 15

## 2013-11-29 MED ORDER — PROMETHAZINE HCL 25 MG/ML IJ SOLN: 6.2500 mg | INTRAMUSCULAR | Status: DC | PRN

## 2013-11-29 MED ORDER — ACETAMINOPHEN 650 MG RE SUPP: 650.0000 mg | RECTAL | Status: DC | PRN

## 2013-11-29 MED ORDER — DOXEPIN HCL 50 MG PO CAPS
50.0000 mg | ORAL_CAPSULE | Freq: Two times a day (BID) | ORAL | Status: DC
  Administered 2013-11-29: 50 mg via ORAL
  Filled 2013-11-29 (×3): qty 1

## 2013-11-29 MED ORDER — OLOPATADINE HCL 0.1 % OP SOLN
1.0000 [drp] | Freq: Two times a day (BID) | OPHTHALMIC | Status: DC
  Administered 2013-11-29: 1 [drp] via OPHTHALMIC
  Filled 2013-11-29: qty 5

## 2013-11-29 MED ORDER — SODIUM CHLORIDE 0.9 % IJ SOLN
3.0000 mL | Freq: Two times a day (BID) | INTRAMUSCULAR | Status: DC
  Administered 2013-11-29: 3 mL via INTRAVENOUS

## 2013-11-29 MED ORDER — LIDOCAINE HCL (CARDIAC) 20 MG/ML IV SOLN
INTRAVENOUS | Status: DC | PRN
  Administered 2013-11-29: 100 mg via INTRAVENOUS

## 2013-11-29 MED ORDER — TOPIRAMATE 25 MG PO TABS
50.0000 mg | ORAL_TABLET | Freq: Two times a day (BID) | ORAL | Status: DC
  Administered 2013-11-29 – 2013-11-30 (×2): 50 mg via ORAL
  Filled 2013-11-29 (×3): qty 2

## 2013-11-29 MED ORDER — THROMBIN 5000 UNITS EX SOLR
CUTANEOUS | Status: DC | PRN
  Administered 2013-11-29 (×2): 5000 [IU] via TOPICAL

## 2013-11-29 MED ORDER — 0.9 % SODIUM CHLORIDE (POUR BTL) OPTIME
TOPICAL | Status: DC | PRN
  Administered 2013-11-29: 1000 mL

## 2013-11-29 MED ORDER — GLYCOPYRROLATE 0.2 MG/ML IJ SOLN
INTRAMUSCULAR | Status: AC
Start: 2013-11-29 — End: 2013-11-29
  Filled 2013-11-29: qty 3

## 2013-11-29 MED ORDER — MORPHINE SULFATE 2 MG/ML IJ SOLN: 1.0000 mg | INTRAMUSCULAR | Status: DC | PRN

## 2013-11-29 MED ORDER — LIDOCAINE-EPINEPHRINE 0.5 %-1:200000 IJ SOLN
INTRAMUSCULAR | Status: DC | PRN
  Administered 2013-11-29: 7 mL via INTRADERMAL

## 2013-11-29 MED ORDER — ONDANSETRON HCL 4 MG/2ML IJ SOLN: 4.0000 mg | INTRAMUSCULAR | Status: DC | PRN

## 2013-11-29 MED ORDER — PIMECROLIMUS 1 % EX CREA: 1.0000 "application " | TOPICAL_CREAM | Freq: Two times a day (BID) | CUTANEOUS | Status: DC

## 2013-11-29 MED ORDER — ACETAMINOPHEN 325 MG PO TABS: 650.0000 mg | ORAL_TABLET | ORAL | Status: DC | PRN

## 2013-11-29 MED ORDER — BISACODYL 5 MG PO TBEC
5.0000 mg | DELAYED_RELEASE_TABLET | Freq: Every day | ORAL | Status: DC | PRN
  Filled 2013-11-29: qty 3

## 2013-11-29 MED ORDER — FENTANYL CITRATE 0.05 MG/ML IJ SOLN
INTRAMUSCULAR | Status: AC
  Filled 2013-11-29: qty 2

## 2013-11-29 MED ORDER — NORETHINDRONE 0.35 MG PO TABS: 1.0000 | ORAL_TABLET | Freq: Every day | ORAL | Status: DC

## 2013-11-29 MED ORDER — LACTATED RINGERS IV SOLN
INTRAVENOUS | Status: DC
  Administered 2013-11-29: 09:00:00 via INTRAVENOUS

## 2013-11-29 MED ORDER — HYDROCORTISONE 1 % EX CREA
TOPICAL_CREAM | Freq: Two times a day (BID) | CUTANEOUS | Status: DC
  Administered 2013-11-29: 22:00:00 via TOPICAL
  Filled 2013-11-29: qty 28

## 2013-11-29 MED ORDER — PROPOFOL 10 MG/ML IV BOLUS
INTRAVENOUS | Status: AC
  Filled 2013-11-29: qty 20

## 2013-11-29 MED ORDER — CLONIDINE HCL 0.1 MG PO TABS
0.1000 mg | ORAL_TABLET | Freq: Two times a day (BID) | ORAL | Status: DC
  Administered 2013-11-29 – 2013-11-30 (×2): 0.1 mg via ORAL
  Filled 2013-11-29 (×3): qty 1

## 2013-11-29 MED ORDER — EPINEPHRINE 0.3 MG/0.3ML IJ SOAJ: 0.3000 mg | INTRAMUSCULAR | Status: DC | PRN

## 2013-11-29 MED ORDER — LORATADINE 10 MG PO TABS
10.0000 mg | ORAL_TABLET | Freq: Every day | ORAL | Status: DC
  Administered 2013-11-30: 10 mg via ORAL
  Filled 2013-11-29: qty 1

## 2013-11-29 MED ORDER — PROPRANOLOL HCL 10 MG PO TABS
10.0000 mg | ORAL_TABLET | Freq: Three times a day (TID) | ORAL | Status: DC
  Administered 2013-11-29 – 2013-11-30 (×2): 10 mg via ORAL
  Filled 2013-11-29 (×4): qty 1

## 2013-11-29 MED ORDER — NEOSTIGMINE METHYLSULFATE 10 MG/10ML IV SOLN
INTRAVENOUS | Status: AC
  Filled 2013-11-29: qty 1

## 2013-11-29 MED ORDER — FENTANYL CITRATE 0.05 MG/ML IJ SOLN
INTRAMUSCULAR | Status: AC
  Filled 2013-11-29: qty 5

## 2013-11-29 SURGICAL SUPPLY — 57 items
ADH SKN CLS APL DERMABOND .7 (GAUZE/BANDAGES/DRESSINGS) ×1
APL SKNCLS STERI-STRIP NONHPOA (GAUZE/BANDAGES/DRESSINGS)
BAG DECANTER FOR FLEXI CONT (MISCELLANEOUS) ×2 IMPLANT
BENZOIN TINCTURE PRP APPL 2/3 (GAUZE/BANDAGES/DRESSINGS) IMPLANT
BLADE 10 SAFETY STRL DISP (BLADE) ×2 IMPLANT
BLADE SURG ROTATE 9660 (MISCELLANEOUS) IMPLANT
BUR MATCHSTICK NEURO 3.0 LAGG (BURR) ×2 IMPLANT
CANISTER SUCT 3000ML (MISCELLANEOUS) ×2 IMPLANT
CONT SPEC 4OZ CLIKSEAL STRL BL (MISCELLANEOUS) ×2 IMPLANT
DECANTER SPIKE VIAL GLASS SM (MISCELLANEOUS) ×2 IMPLANT
DERMABOND ADVANCED (GAUZE/BANDAGES/DRESSINGS) ×1
DERMABOND ADVANCED .7 DNX12 (GAUZE/BANDAGES/DRESSINGS) ×1 IMPLANT
DRAPE LAPAROTOMY 100X72X124 (DRAPES) ×2 IMPLANT
DRAPE MICROSCOPE LEICA (MISCELLANEOUS) ×2 IMPLANT
DRAPE POUCH INSTRU U-SHP 10X18 (DRAPES) ×2 IMPLANT
DRAPE SURG 17X23 STRL (DRAPES) ×2 IMPLANT
DURAPREP 26ML APPLICATOR (WOUND CARE) ×2 IMPLANT
ELECT REM PT RETURN 9FT ADLT (ELECTROSURGICAL) ×2
ELECTRODE REM PT RTRN 9FT ADLT (ELECTROSURGICAL) ×1 IMPLANT
GAUZE SPONGE 4X4 16PLY XRAY LF (GAUZE/BANDAGES/DRESSINGS) IMPLANT
GLOVE BIOGEL PI IND STRL 7.0 (GLOVE) IMPLANT
GLOVE BIOGEL PI INDICATOR 7.0 (GLOVE) ×2
GLOVE ECLIPSE 6.5 STRL STRAW (GLOVE) ×1 IMPLANT
GLOVE EXAM NITRILE LRG STRL (GLOVE) IMPLANT
GLOVE EXAM NITRILE MD LF STRL (GLOVE) IMPLANT
GLOVE EXAM NITRILE XL STR (GLOVE) IMPLANT
GLOVE EXAM NITRILE XS STR PU (GLOVE) IMPLANT
GLOVE SURG SS PI 6.5 STRL IVOR (GLOVE) ×1 IMPLANT
GLOVE SURG SS PI 8.5 STRL IVOR (GLOVE) ×2
GLOVE SURG SS PI 8.5 STRL STRW (GLOVE) IMPLANT
GOWN STRL REUS W/ TWL LRG LVL3 (GOWN DISPOSABLE) ×2 IMPLANT
GOWN STRL REUS W/ TWL XL LVL3 (GOWN DISPOSABLE) IMPLANT
GOWN STRL REUS W/TWL 2XL LVL3 (GOWN DISPOSABLE) IMPLANT
GOWN STRL REUS W/TWL LRG LVL3 (GOWN DISPOSABLE) ×4
GOWN STRL REUS W/TWL XL LVL3 (GOWN DISPOSABLE)
KIT BASIN OR (CUSTOM PROCEDURE TRAY) ×2 IMPLANT
KIT ROOM TURNOVER OR (KITS) ×2 IMPLANT
NDL HYPO 25X1 1.5 SAFETY (NEEDLE) ×1 IMPLANT
NDL SPNL 18GX3.5 QUINCKE PK (NEEDLE) IMPLANT
NEEDLE HYPO 25X1 1.5 SAFETY (NEEDLE) ×2 IMPLANT
NEEDLE SPNL 18GX3.5 QUINCKE PK (NEEDLE) IMPLANT
NS IRRIG 1000ML POUR BTL (IV SOLUTION) ×2 IMPLANT
PACK LAMINECTOMY NEURO (CUSTOM PROCEDURE TRAY) ×2 IMPLANT
PAD ARMBOARD 7.5X6 YLW CONV (MISCELLANEOUS) ×6 IMPLANT
RUBBERBAND STERILE (MISCELLANEOUS) ×4 IMPLANT
SPONGE GAUZE 4X4 12PLY (GAUZE/BANDAGES/DRESSINGS) IMPLANT
SPONGE LAP 4X18 X RAY DECT (DISPOSABLE) IMPLANT
SPONGE SURGIFOAM ABS GEL SZ50 (HEMOSTASIS) ×2 IMPLANT
STRIP CLOSURE SKIN 1/2X4 (GAUZE/BANDAGES/DRESSINGS) IMPLANT
SUT VIC AB 0 CT1 18XCR BRD8 (SUTURE) ×1 IMPLANT
SUT VIC AB 0 CT1 8-18 (SUTURE) ×2
SUT VIC AB 2-0 CT1 18 (SUTURE) ×2 IMPLANT
SUT VIC AB 3-0 SH 8-18 (SUTURE) ×3 IMPLANT
SYR 20ML ECCENTRIC (SYRINGE) ×2 IMPLANT
TOWEL OR 17X24 6PK STRL BLUE (TOWEL DISPOSABLE) ×2 IMPLANT
TOWEL OR 17X26 10 PK STRL BLUE (TOWEL DISPOSABLE) ×2 IMPLANT
WATER STERILE IRR 1000ML POUR (IV SOLUTION) ×2 IMPLANT

## 2013-11-29 NOTE — Anesthesia Postprocedure Evaluation (Signed)
Anesthesia Post Note  Patient: Sharon Vazquez  Procedure(s) Performed: Procedure(s) (LRB): LEFT Lumbar Four-Five Redo microdiskectomy (Left)  Anesthesia type: general  Patient location: PACU  Post pain: Pain level controlled  Post assessment: Patient's Cardiovascular Status Stable  Last Vitals:  Filed Vitals:   11/29/13 1643  BP: 131/73  Pulse: 87  Temp: 36.6 C  Resp: 18    Post vital signs: Reviewed and stable  Level of consciousness: sedated  Complications: No apparent anesthesia complications

## 2013-11-29 NOTE — Anesthesia Preprocedure Evaluation (Signed)
Anesthesia Evaluation    Reviewed: Allergy & Precautions, H&P , NPO status , Patient's Chart, lab work & pertinent test results  Airway       Dental   Pulmonary asthma , former smoker,          Cardiovascular hypertension, Pt. on home beta blockers     Neuro/Psych  Headaches, PSYCHIATRIC DISORDERS Anxiety Bipolar Disorder    GI/Hepatic Neg liver ROS, GERD-  ,  Endo/Other  negative endocrine ROS  Renal/GU negative Renal ROS     Musculoskeletal   Abdominal   Peds  Hematology   Anesthesia Other Findings   Reproductive/Obstetrics                           Anesthesia Physical Anesthesia Plan  ASA: III  Anesthesia Plan: General   Post-op Pain Management:    Induction: Intravenous  Airway Management Planned: Oral ETT  Additional Equipment:   Intra-op Plan:   Post-operative Plan: Extubation in OR  Informed Consent:   Plan Discussed with: CRNA, Anesthesiologist and Surgeon  Anesthesia Plan Comments:         Anesthesia Quick Evaluation

## 2013-11-29 NOTE — Op Note (Signed)
11/29/2013  2:58 PM  PATIENT:  Sharon Vazquez  33 y.o. 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 was taken to the operating room, intubated and placed under a general anesthetic without difficulty. She was positioned prone on a Wilson frame with all pressure points padded. Her back was prepped and draped in a sterile manner. I opened the skin with a 10 blade and carried the dissection down to the thoracolumbar fascia. I used both sharp dissection and the monopolar cautery to expose the lamina of L3, and L4. I confirmed my location with an intraoperative xray.  I used the drill, Kerrison punches, and curettes to perform a redo semihemilaminectomy of L3. I used the punches to remove the ligamentum flavum to expose the thecal sac. I brought the microscope into the operative field and with Dr.Jenkin's assistance we started our decompression of the spinal canal, thecal sac and L4 root(s). I cauterized epidural veins overlying the disc space then divided them sharply. I opened the disc space with a 15 blade and proceeded with the discectomy. I used pituitary rongeurs, curettes, and other instruments to remove disc material. After the discectomy was completed we inspected the left L4 nerve root and felt it was well decompressed. I explored rostrally, laterally, medially, and caudally and was satisfied with the decompression. I irrigated the wound, then closed in layers. I approximated  the thoracolumbar fascia, subcutaneous, and subcuticular planes with vicryl sutures. I used dermabond for a sterile dressing.   PLAN OF CARE: Admit for overnight observation  PATIENT DISPOSITION:  PACU - hemodynamically stable.   Delay start of Pharmacological VTE agent (>24hrs) due to surgical blood loss or risk of bleeding:  yes

## 2013-11-29 NOTE — Anesthesia Procedure Notes (Signed)
Procedure Name: Intubation Date/Time: 11/29/2013 1:01 PM Performed by: Ned Grace Pre-anesthesia Checklist: Patient identified, Patient being monitored, Emergency Drugs available, Timeout performed and Suction available Patient Re-evaluated:Patient Re-evaluated prior to inductionOxygen Delivery Method: Circle system utilized Preoxygenation: Pre-oxygenation with 100% oxygen Intubation Type: IV induction Ventilation: Mask ventilation without difficulty Laryngoscope Size: Mac and 4 Grade View: Grade I Tube size: 7.0 mm Number of attempts: 1 Airway Equipment and Method: Stylet Placement Confirmation: ETT inserted through vocal cords under direct vision,  breath sounds checked- equal and bilateral and positive ETCO2 Secured at: 23 cm Tube secured with: Tape Dental Injury: Teeth and Oropharynx as per pre-operative assessment

## 2013-11-29 NOTE — Plan of Care (Signed)
Problem: Consults Goal: Diagnosis - Spinal Surgery Outcome: Completed/Met Date Met:  11/29/13 Microdiscectomy

## 2013-11-29 NOTE — H&P (Signed)
BP 100/75  Pulse 99  Temp(Src) 98 F (36.7 C) (Oral)  Resp 18  Wt 100.245 kg (221 lb)  SpO2 100%  LMP 11/20/2013  LMP 11/20/2013. Allergies  Allergen Reactions  . Bee Venom Anaphylaxis  . Dilaudid [Hydromorphone Hcl] Anaphylaxis and Hives  . Dilaudid [Hydromorphone Hcl] Anaphylaxis and Hives  . Latex Hives  . Senna Anaphylaxis and Hives  . Adhesive [Tape] Hives and Other (See Comments)    Pulls skin off (use paper tape)   Past Medical History  Diagnosis Date  . Hypertension   . Anxiety   . Bipolar 1 disorder   . ADHD (attention deficit hyperactivity disorder)   . Borderline personality disorder   . Panic attack   . Headache(784.0)   . Panic disorder   . HSV-2 (herpes simplex virus 2) infection   . Hx of chlamydia infection   . IBS (irritable bowel syndrome)   . Abnormal Pap smear   . Constipation     takes Dulcolax daily as needed takes Amitiza daily  . Anxiety     takes Xanax daily as needed  . Back spasm     takes Flexeril daily as needed  . Insomnia     takes Ambien nightly  . Hypertension     takes INderal and Clonidine daily  . Shortness of breath     with exertion  . Asthma     Ventolin as needed and QVAR takes daily  . History of bronchitis     last time 4-67yrs ago  . Headache(784.0)     migraines-last one about 47months ago;takes Topamax daily  . Weakness     numbness and tingling to left foot  . Joint swelling     right knee  . Chronic back pain     HNP  . Eczema     has 2 creams uses as needed  . GERD (gastroesophageal reflux disease)     takes Tums as needed  . Internal hemorrhoids   . History of colon polyps   . Urinary urgency   . Mental disorder     takes Abilify as needed  . Bipolar 1 disorder     takes DOxepin daily  . Obesity   . Contraceptive management 08/23/2013   Past Surgical History  Procedure Laterality Date  . Wisdom tooth extraction    . Bunion removal    . Cholecystectomy  10 yrs ago  . Colonoscopy N/A 08/22/2012     Procedure: COLONOSCOPY;  Surgeon: Rogene Houston, MD;  Location: [facility];  Service: Endoscopy;  Laterality: N/A;  100  . Tonsillectomy    . Cholecystectomy  10+yrs ago  . Tonsillectomy      as a child  . Bunionectomy Left     pins in big toe and 2nd toe  . Wisdom teeth extracted    . Epidural injections      x 2  . Colonoscopy    . Lumbar laminectomy/decompression microdiscectomy Left 07/03/2013    Procedure: LEFT LUMBAR THREE-FOUR microdiskectomy;  Surgeon: Winfield Cunas, MD;  Location: [place] [facility];  Service: Neurosurgery;  Laterality: Left;  LEFT LUMBAR THREE-FOUR microdiskectomy   Family History  Problem Relation Age of Onset  . Hypertension Mother   . Diabetes Mother   . Thyroid disease Mother   . Hypertension Maternal Aunt   . Diabetes Maternal Aunt   . Thyroid disease Maternal Aunt   . Diabetes Maternal Grandmother   . Heart disease Maternal Grandmother  CHF  . Hypertension Maternal Grandmother   . Diabetes Father   . Diabetes Paternal Grandmother   . Crohn's disease Maternal Uncle   . Hypertension Maternal Uncle   . Diabetes Cousin    History   Social History  . Marital Status: Married    Spouse Name: N/A    Number of Children: N/A  . Years of Education: N/A   Occupational History  . Not on file.   Social History Main Topics  . Smoking status: Former Smoker -- 1.00 packs/day for 9 years    Quit date: 06/07/2011  . Smokeless tobacco: Never Used     Comment: quit 07/06/12  . Alcohol Use: Yes     Comment: rarely  . Drug Use: No     Comment: Smoked marijuana about a month ago. Normally she does not do drugs.  . Sexual Activity: Yes    Birth Control/ Protection: Pill   Other Topics Concern  . Not on file   Social History Narrative   ** Merged History Encounter **       ** Merged History Encounter **       Ct Cervical Spine W Contrast  11/01/2013   CLINICAL DATA:  Neck pain and back pain  FLUOROSCOPY TIME:  One Min and 32 seconds  PROCEDURE:  LUMBAR PUNCTURE FOR CERVICAL LUMBAR   MYELOGRAM  CERVICAL AND LUMBAR AND THORACIC MYELOGRAM  CT CERVICAL MYELOGRAM  CT LUMBAR MYELOGRAM  Lumbar puncture and intrathecal contrast administration were performed by Dr. could bell who will separately report for the portion of the procedure. I personally supervised acquisition of the myelogram images.  TECHNIQUE: Contiguous axial images were obtained through the Cervical, and Lumbar spine after the intrathecal infusion of infusion. Coronal and sagittal reconstructions were obtained of the axial image sets.  FINDINGS: CERVICAL AND LUMBAR MYELOGRAM FINDINGS:  Normal lumbar alignment. No significant spinal stenosis. There is a moderately large extradural defect on the left at L3-4 consistent with a disc protrusion. There is mass effect on the left L4 nerve root.  Normal cervical alignment. Negative for spinal stenosis. No significant disc space narrowing are spondylosis.  CT CERVICAL MYELOGRAM FINDINGS:  Mild cervical kyphosis. Normal alignment. No fracture or mass. No cord compression.  C2-3:  Negative  C3-4:  Negative  C4-5:  Negative  C5-6: Small central disc protrusion with mild flattening of the cord. No significant spinal stenosis. Neural foramina widely patent  C6-7:  Negative  C7-T1:  Negative  CT LUMBAR MYELOGRAM FINDINGS:  Negative for fracture or mass lesion. Conus medullaris is normal and terminates at T12-L1.  L1-2:  Negative  L2-3:  Negative  L3-4: 3 mm retrolisthesis with disc and facet degeneration. Moderately large left-sided disc protrusion. Mild swelling of the left L4 nerve root in the thecal sac. Mild spinal stenosis  L4-5: Disk bulging and facet hypertrophy. Mild to moderate spinal stenosis.  L5-S1:  Negative  IMPRESSION: Small central disc protrusion at C5-6 without significant spinal stenosis. The remaining cervical spine is negative.  Moderately large left-sided disc protrusion at L3-4 with mild spinal stenosis. Mild retrolisthesis of L3 on L4 due to  facet and disc degeneration.  Disk bulging and facet hypertrophy at L4-5 with mild to moderate spinal stenosis.   Electronically Signed   By: Franchot Gallo M.D.   On: 11/01/2013 15:28   Ct Lumbar Spine W Contrast  11/01/2013   CLINICAL DATA:  Neck pain and back pain  FLUOROSCOPY TIME:  One Min and 32 seconds  PROCEDURE: LUMBAR PUNCTURE FOR CERVICAL LUMBAR   MYELOGRAM  CERVICAL AND LUMBAR AND THORACIC MYELOGRAM  CT CERVICAL MYELOGRAM  CT LUMBAR MYELOGRAM  Lumbar puncture and intrathecal contrast administration were performed by Dr. could bell who will separately report for the portion of the procedure. I personally supervised acquisition of the myelogram images.  TECHNIQUE: Contiguous axial images were obtained through the Cervical, and Lumbar spine after the intrathecal infusion of infusion. Coronal and sagittal reconstructions were obtained of the axial image sets.  FINDINGS: CERVICAL AND LUMBAR MYELOGRAM FINDINGS:  Normal lumbar alignment. No significant spinal stenosis. There is a moderately large extradural defect on the left at L3-4 consistent with a disc protrusion. There is mass effect on the left L4 nerve root.  Normal cervical alignment. Negative for spinal stenosis. No significant disc space narrowing are spondylosis.  CT CERVICAL MYELOGRAM FINDINGS:  Mild cervical kyphosis. Normal alignment. No fracture or mass. No cord compression.  C2-3:  Negative  C3-4:  Negative  C4-5:  Negative  C5-6: Small central disc protrusion with mild flattening of the cord. No significant spinal stenosis. Neural foramina widely patent  C6-7:  Negative  C7-T1:  Negative  CT LUMBAR MYELOGRAM FINDINGS:  Negative for fracture or mass lesion. Conus medullaris is normal and terminates at T12-L1.  L1-2:  Negative  L2-3:  Negative  L3-4: 3 mm retrolisthesis with disc and facet degeneration. Moderately large left-sided disc protrusion. Mild swelling of the left L4 nerve root in the thecal sac. Mild spinal stenosis  L4-5: Disk  bulging and facet hypertrophy. Mild to moderate spinal stenosis.  L5-S1:  Negative  IMPRESSION: Small central disc protrusion at C5-6 without significant spinal stenosis. The remaining cervical spine is negative.  Moderately large left-sided disc protrusion at L3-4 with mild spinal stenosis. Mild retrolisthesis of L3 on L4 due to facet and disc degeneration.  Disk bulging and facet hypertrophy at L4-5 with mild to moderate spinal stenosis.   Electronically Signed   By: Franchot Gallo M.D.   On: 11/01/2013 15:28   Dg Myelogram 2+ Regions  11/01/2013   CLINICAL DATA:  Neck pain and back pain  FLUOROSCOPY TIME:  One Min and 32 seconds  PROCEDURE: LUMBAR PUNCTURE FOR CERVICAL LUMBAR   MYELOGRAM  CERVICAL AND LUMBAR AND THORACIC MYELOGRAM  CT CERVICAL MYELOGRAM  CT LUMBAR MYELOGRAM  Lumbar puncture and intrathecal contrast administration were performed by Dr. could bell who will separately report for the portion of the procedure. I personally supervised acquisition of the myelogram images.  TECHNIQUE: Contiguous axial images were obtained through the Cervical, and Lumbar spine after the intrathecal infusion of infusion. Coronal and sagittal reconstructions were obtained of the axial image sets.  FINDINGS: CERVICAL AND LUMBAR MYELOGRAM FINDINGS:  Normal lumbar alignment. No significant spinal stenosis. There is a moderately large extradural defect on the left at L3-4 consistent with a disc protrusion. There is mass effect on the left L4 nerve root.  Normal cervical alignment. Negative for spinal stenosis. No significant disc space narrowing are spondylosis.  CT CERVICAL MYELOGRAM FINDINGS:  Mild cervical kyphosis. Normal alignment. No fracture or mass. No cord compression.  C2-3:  Negative  C3-4:  Negative  C4-5:  Negative  C5-6: Small central disc protrusion with mild flattening of the cord. No significant spinal stenosis. Neural foramina widely patent  C6-7:  Negative  C7-T1:  Negative  CT LUMBAR MYELOGRAM FINDINGS:   Negative for fracture or mass lesion. Conus medullaris is normal and terminates at T12-L1.  L1-2:  Negative  L2-3:  Negative  L3-4: 3 mm retrolisthesis with disc and facet degeneration. Moderately large left-sided disc protrusion. Mild swelling of the left L4 nerve root in the thecal sac. Mild spinal stenosis  L4-5: Disk bulging and facet hypertrophy. Mild to moderate spinal stenosis.  L5-S1:  Negative  IMPRESSION: Small central disc protrusion at C5-6 without significant spinal stenosis. The remaining cervical spine is negative.  Moderately large left-sided disc protrusion at L3-4 with mild spinal stenosis. Mild retrolisthesis of L3 on L4 due to facet and disc degeneration.  Disk bulging and facet hypertrophy at L4-5 with mild to moderate spinal stenosis.   Electronically Signed   By: Franchot Gallo M.D.   On: 11/01/2013 15:28       She returns today with the myelogram and postmyelographic CT.  While she does not have the recurrent disc present at the level of her operation, she does have a great deal of scar.  At this point, she would like to proceed with operative decompression.  It may be that this scar is the reason for her continued pain at L3-4.  I am willing to remove it and she understand that scar tissue forms as scar tissue was and even with this she said she is just hurting too much.  We have done injections.  We have had time and nothing has given her relief.  Myelogram clearly shows displacement of the L3 root.  Risks and benefits were discussed.  She understands and wishes to proceed.

## 2013-11-29 NOTE — Transfer of Care (Signed)
Immediate Anesthesia Transfer of Care Note  Patient: Sharon Vazquez  Procedure(s) Performed: Procedure(s) with comments: LEFT Lumbar Four-Five Redo microdiskectomy (Left) - LEFT Lumbar Four-Five Redo microdiskectomy  Patient Location: PACU  Anesthesia Type:General  Level of Consciousness: awake, sedated, patient cooperative and lethargic  Airway & Oxygen Therapy: Patient Spontanous Breathing and Patient connected to nasal cannula oxygen  Post-op Assessment: Report given to PACU RN, Post -op Vital signs reviewed and stable and Patient moving all extremities X 4  Post vital signs: Reviewed and stable  Complications: No apparent anesthesia complications

## 2013-11-30 MED ORDER — OXYCODONE-ACETAMINOPHEN 5-325 MG PO TABS: 1.0000 | ORAL_TABLET | Freq: Four times a day (QID) | ORAL | Status: DC | PRN

## 2013-11-30 NOTE — Progress Notes (Signed)
Pt. Alert and oriented,follows simple instructions, denies pain. Incision area without swelling, redness or S/S of infection. Voiding adequate clear yellow urine. Moving all extremities well and vitals stable and documented. Patient discharged home with mother. Lumbar surgery notes instructions given to patient and family member for home safety and precautions. Pt. and family stated understanding of instructions given.

## 2013-11-30 NOTE — Discharge Instructions (Addendum)
Lumbar Discectomy °Care After °A discectomy involves removal of discmaterial (the cartilage-like structures located between the bones of the back). It is done to relieve pressure on nerve roots. It can be used as a treatment for a back problem. The time in surgery depends on the findings in surgery and what is necessary to correct the problems. °HOME CARE INSTRUCTIONS  °· Check the cut (incision) made by the surgeon twice a day for signs of infection. Some signs of infection may include:  °· A foul smelling, greenish or yellowish discharge from the wound.  °· Increased pain.  °· Increased redness over the incision (operative) site.  °· The skin edges may separate.  °· Flu-like symptoms (problems).  °· A temperature above 101.5° F (38.6° C).  °· Change your bandages in about 24 to 36 hours following surgery or as directed.  °· You may shower tomrrow.  Avoid bathtubs, swimming pools and hot tubs for three weeks or until your incision has healed completely. °· Follow your doctor's instructions as to safe activities, exercises, and physical therapy.  °· Weight reduction may be beneficial if you are overweight.  °· Daily exercise is helpful to prevent the return of problems. Walking is permitted. You may use a treadmill without an incline. Cut down on activities and exercise if you have discomfort. You may also go up and down stairs as much as you can tolerate.  °· DO NOT lift anything heavier than 10 to 15 lbs. Avoid bending or twisting at the waist. Always bend your knees when lifting.  °· Maintain strength and range of motion as instructed.  °· Do not drive for 10 days, or as directed by your doctors. You may be a passenger . Lying back in the passenger seat may be more comfortable for you. Always wear a seatbelt.  °· Limit your sitting in a regular chair to 20 to 30 minutes at a time. There are no limitations for sitting in a recliner. You should lie down or walk in between sitting periods.  °· Only take  over-the-counter or prescription medicines for pain, discomfort, or fever as directed by your caregiver.  °SEEK MEDICAL CARE IF:  °· There is increased bleeding (more than a small spot) from the wound.  °· You notice redness, swelling, or increasing pain in the wound.  °· Pus is coming from wound.  °· You develop an unexplained oral temperature above 102° F (38.9° C) develops.  °· You notice a foul smell coming from the wound or dressing.  °· You have increasing pain in your wound.  °SEEK IMMEDIATE MEDICAL CARE IF:  °· You develop a rash.  °· You have difficulty breathing.  °· You develop any allergic problems to medicines given.  °Document Released: 04/27/2004 Document Revised: 05/12/2011 Document Reviewed: 08/16/2007 °ExitCare® Patient Information ° ° ° °Wound Care °Leave incision open to air. °You may shower. °Do not scrub directly on incision.  °Do not put any creams, lotions, or ointments on incision. °Activity °Walk each and every day, increasing distance each day. °No lifting greater than 5 lbs.  Avoid bending, arching, and twisting. °No driving for 2 weeks; may ride as a passenger locally. °If provided with back brace, wear when out of bed.  It is not necessary to wear in bed. °Diet °Resume your normal diet.  °Return to Work °Will be discussed at you follow up appointment. °Call Your Doctor If Any of These Occur °Redness, drainage, or swelling at the wound.  °Temperature greater than   101 degrees. °Severe pain not relieved by pain medication. °Incision starts to come apart. °Follow Up Appt °Call today for appointment in 4 weeks ([phone]) or for problems.  If you have any hardware placed in your spine, you will need an x-ray before your appointment. °

## 2013-11-30 NOTE — Discharge Summary (Signed)
Physician Discharge Summary  Patient ID: NAQUITA NAPPIER MRN: [id number] DOB/AGE: 03-04-1981 33 y.o.  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 was admitted and taken to the operating room for an uncomplicated redo laminectomy and discetomy. Although I did not find a large or significant recurrence the nerve root was well decompresssed and disc removed. Post op she has had pain in the left lower extremity, but is improved from her preoperative state. Her wound is clean, dry, and without signs of infection. She is tolerating a regular diet, ambulating with a rolling walker, and voiding.   Consults: None  Significant Diagnostic Studies: none  Treatments: surgery: Redo Left L3/4 discetomy  Discharge Exam: Blood pressure 115/69, pulse 100, temperature 98.7 F (37.1 C), temperature source Oral, resp. rate 18, weight 100.245 kg (221 lb), last menstrual period 11/20/2013, SpO2 97.00%. General appearance: alert, cooperative, appears stated age and moderate distress Neurologic: Alert and oriented X 3, normal strength and tone. Normal symmetric reflexes. Normal coordination and gait  Disposition: 01-Home or Self Care  Discharge Instructions   Walker rolling    Complete by:  As directed             Medication List    STOP taking these medications       HYDROcodone-acetaminophen 5-325 MG per tablet  Commonly known as:  NORCO/VICODIN      TAKE these medications       albuterol 108 (90 BASE) MCG/ACT inhaler  Commonly known as:  PROVENTIL HFA;VENTOLIN HFA  Inhale 2 puffs into the lungs every 4 (four) hours as needed for wheezing or shortness of breath.     alprazolam 2 MG tablet  Commonly known as:  XANAX  Take 2 mg by mouth 3 (three) times daily as needed for anxiety  (panic attacks).     ARIPiprazole 10 MG tablet  Commonly known as:  ABILIFY  Take 10 mg by mouth daily.     beclomethasone 80 MCG/ACT inhaler  Commonly known as:  QVAR  Inhale 2 puffs into the lungs daily.     bisacodyl 5 MG EC tablet  Commonly known as:  DULCOLAX  Take 5-15 mg by mouth daily as needed for moderate constipation.     CAMBIA 50 MG Pack  Generic drug:  Diclofenac Potassium  Take 50 mg by mouth daily as needed (migraines).     cloNIDine 0.1 MG tablet  Commonly known as:  CATAPRES  Take 0.1 mg by mouth 2 (two) times daily.     desonide 0.05 % cream  Commonly known as:  DESOWEN  Apply 1 application topically 2 (two) times daily.     diphenhydrAMINE 25 MG tablet  Commonly known as:  BENADRYL  Take 50 mg by mouth every 6 (six) hours as needed for itching.     doxepin 50 MG capsule  Commonly known as:  SINEQUAN  Take 50 mg by mouth See admin instructions. Takes one capsule twice a day and one at bedtime if needed     EPIPEN 0.3 mg/0.3 mL Soaj injection  Generic drug:  EPINEPHrine  Inject 0.3 mg into the muscle as needed (allergic reaction).     esomeprazole 40 MG capsule  Commonly known as:  NEXIUM  Take 40 mg by mouth daily at 12 noon.     fexofenadine 180 MG tablet  Commonly known as:  ALLEGRA  Take 180 mg by mouth daily.     gabapentin 300 MG capsule  Commonly known as:  NEURONTIN  Take 300 mg by mouth 3 (three) times daily.     HEATHER 0.35 MG tablet  Generic drug:  norethindrone  Take 1 tablet by mouth daily.     hydrocortisone 2.5 % cream  Apply 1 application topically 3 (three) times daily as needed (rash).     ibuprofen 800 MG tablet  Commonly known as:  ADVIL,MOTRIN  Take 800 mg by mouth every 8 (eight) hours as needed.     lubiprostone 24 MCG capsule  Commonly known as:  AMITIZA  Take 24 mcg by mouth 2 (two) times daily with a meal.     mometasone 50 MCG/ACT nasal spray  Commonly known as:  NASONEX  Place 1-2 sprays into the nose  daily.     nystatin 100000 UNIT/GM Powd  Apply topically See admin instructions. Apply two-three times a day to affected area as needed     nystatin-triamcinolone cream  Commonly known as:  MYCOLOG II  Apply 1 application topically 2 (two) times daily as needed (hemorrhoids).     oxyCODONE-acetaminophen 5-325 MG per tablet  Commonly known as:  ROXICET  Take 1 tablet by mouth every 6 (six) hours as needed for severe pain.     PATADAY 0.2 % Soln  Generic drug:  Olopatadine HCl  Place 1 drop into both eyes daily as needed (itchy eyes).     pimecrolimus 1 % cream  Commonly known as:  ELIDEL  Apply 1 application topically 2 (two) times daily as needed (facial rash).     promethazine 25 MG tablet  Commonly known as:  PHENERGAN  Take 25 mg by mouth every 6 (six) hours as needed for nausea or vomiting.     propranolol 10 MG tablet  Commonly known as:  INDERAL  Take 10 mg by mouth 3 (three) times daily.     terbinafine 250 MG tablet  Commonly known as:  LAMISIL  Take 250 mg by mouth daily.     topiramate 25 MG tablet  Commonly known as:  TOPAMAX  Take 50 mg by mouth 2 (two) times daily.     zolpidem 10 MG tablet  Commonly known as:  AMBIEN  Take 10 mg by mouth at bedtime.           Follow-up Information   Follow up In 3 weeks. (call office to make an appointment)       Signed: CABBELL,KYLE L 11/30/2013, 8:41 AM

## 2013-12-03 ENCOUNTER — Encounter (HOSPITAL_COMMUNITY): Payer: Self-pay | Admitting: Neurosurgery

## 2014-03-04 ENCOUNTER — Encounter (INDEPENDENT_AMBULATORY_CARE_PROVIDER_SITE_OTHER): Payer: Self-pay | Admitting: Internal Medicine

## 2014-03-04 ENCOUNTER — Ambulatory Visit (INDEPENDENT_AMBULATORY_CARE_PROVIDER_SITE_OTHER): Payer: BC Managed Care – PPO | Admitting: Internal Medicine

## 2014-03-04 VITALS — BP 130/78 | HR 84 | Temp 97.8°F | Resp 18 | Ht 66.0 in | Wt 216.6 lb

## 2014-03-04 DIAGNOSIS — R1032 Left lower quadrant pain: Secondary | ICD-10-CM

## 2014-03-04 DIAGNOSIS — K5909 Other constipation: Secondary | ICD-10-CM

## 2014-03-04 DIAGNOSIS — K5900 Constipation, unspecified: Secondary | ICD-10-CM

## 2014-03-04 DIAGNOSIS — K219 Gastro-esophageal reflux disease without esophagitis: Secondary | ICD-10-CM

## 2014-03-04 MED ORDER — ESOMEPRAZOLE MAGNESIUM 40 MG PO CPDR: 40.0000 mg | DELAYED_RELEASE_CAPSULE | Freq: Every day | ORAL | Status: DC

## 2014-03-04 MED ORDER — LUBIPROSTONE 24 MCG PO CAPS: 24.0000 ug | ORAL_CAPSULE | Freq: Two times a day (BID) | ORAL | Status: DC

## 2014-03-04 NOTE — Progress Notes (Signed)
Presenting complaint;  Followup for constipation left-sided abdominal pain and GERD.  Subjective:  Patient is 33 year old African female who presents for scheduled visit. She was last seen 6 months ago unresponsive to therapy for constipation and persistent abdominal pain. She was referred to [facility] where she was seen on second 2015. She does not feel any better. She continues to complain of constipation and left-sided abdominal pain which radiates posteriorly. She is having bowel movements every 5-7 days. She has to take 4 pills of MOM for her bowels to move. She does not have a good appetite. She has lost 7 pounds since her last visit. She states she has lost over 40 pounds in the last 11 months. She ran out of her Nexium and now is having heartburn. She states her PCP has relocated. She eats one meal a day. She is drinking one can of Ensure daily and takes Metamucil every day.   Current Medications: Outpatient Encounter Prescriptions as of 03/04/2014  Medication Sig  . albuterol (PROVENTIL HFA;VENTOLIN HFA) 108 (90 BASE) MCG/ACT inhaler Inhale 2 puffs into the lungs every 4 (four) hours as needed for wheezing or shortness of breath.   . alprazolam (XANAX) 2 MG tablet Take 2 mg by mouth 3 (three) times daily as needed for anxiety (panic attacks).  Marland Kitchen ALUVEA 39 % CREA 2 (two) times daily as needed.   . ARIPiprazole (ABILIFY) 10 MG tablet Take 10 mg by mouth daily.  . beclomethasone (QVAR) 80 MCG/ACT inhaler Inhale 2 puffs into the lungs daily.   . cloNIDine (CATAPRES) 0.1 MG tablet Take 0.1 mg by mouth 2 (two) times daily.  . cyclobenzaprine (FLEXERIL) 10 MG tablet Take 10 mg by mouth daily.   . Diclofenac Potassium (CAMBIA) 50 MG PACK Take 50 mg by mouth daily as needed (migraines).  . doxepin (SINEQUAN) 50 MG capsule Take 50 mg by mouth See admin instructions. Takes one capsule twice a day and one at bedtime if needed  . EPINEPHrine (EPIPEN) 0.3 mg/0.3 mL SOAJ injection Inject 0.3 mg  into the muscle as needed (allergic reaction).  Marland Kitchen esomeprazole (NEXIUM) 40 MG capsule Take 40 mg by mouth daily at 12 noon.  . fexofenadine (ALLEGRA) 180 MG tablet Take 180 mg by mouth daily.  Marland Kitchen lubiprostone (AMITIZA) 24 MCG capsule Take 24 mcg by mouth 2 (two) times daily with a meal.  . norethindrone (HEATHER) 0.35 MG tablet Take 1 tablet by mouth daily.  Marland Kitchen nystatin (MYCOSTATIN/NYSTOP) 100000 UNIT/GM POWD Apply topically See admin instructions. Apply two-three times a day to affected area as needed  . nystatin-triamcinolone (MYCOLOG II) cream Apply 1 application topically 2 (two) times daily as needed (hemorrhoids).   . Olopatadine HCl (PATADAY) 0.2 % SOLN Place 1 drop into both eyes daily as needed (itchy eyes).  Marland Kitchen oxyCODONE-acetaminophen (ROXICET) 5-325 MG per tablet Take 1 tablet by mouth every 6 (six) hours as needed for severe pain.  . pimecrolimus (ELIDEL) 1 % cream Apply 1 application topically 2 (two) times daily as needed (facial rash).  . promethazine (PHENERGAN) 25 MG tablet Take 25 mg by mouth every 6 (six) hours as needed for nausea or vomiting.  . propranolol (INDERAL) 10 MG tablet Take 10 mg by mouth 3 (three) times daily.  Marland Kitchen topiramate (TOPAMAX) 25 MG tablet Take 50 mg by mouth 2 (two) times daily.   Marland Kitchen zolpidem (AMBIEN CR) 12.5 MG CR tablet Take 12.5 mg by mouth at bedtime as needed for sleep.  . [DISCONTINUED] bisacodyl (DULCOLAX)  5 MG EC tablet Take 5-15 mg by mouth daily as needed for moderate constipation.  . [DISCONTINUED] desonide (DESOWEN) 0.05 % cream Apply 1 application topically 2 (two) times daily.   . [DISCONTINUED] diphenhydrAMINE (BENADRYL) 25 MG tablet Take 50 mg by mouth every 6 (six) hours as needed for itching.  . [DISCONTINUED] gabapentin (NEURONTIN) 300 MG capsule Take 300 mg by mouth 3 (three) times daily.   . [DISCONTINUED] hydrocortisone 2.5 % cream Apply 1 application topically 3 (three) times daily as needed (rash).  . [DISCONTINUED] ibuprofen  (ADVIL,MOTRIN) 800 MG tablet Take 800 mg by mouth every 8 (eight) hours as needed.  . [DISCONTINUED] mometasone (NASONEX) 50 MCG/ACT nasal spray Place 1-2 sprays into the nose daily.  . [DISCONTINUED] terbinafine (LAMISIL) 250 MG tablet Take 250 mg by mouth daily.  . [DISCONTINUED] zolpidem (AMBIEN) 10 MG tablet Take 10 mg by mouth at bedtime.     Objective: Blood pressure 130/78, pulse 84, temperature 97.8 F (36.6 C), temperature source Oral, resp. rate 18, height 5\' 6"  (1.676 m), weight 216 lb 9.6 oz (98.249 kg), last menstrual period 02/07/2014. Patient is alert and appears to be in some pain(back pain). Conjunctiva is pink. Sclera is nonicteric Oropharyngeal mucosa is normal. No neck masses or thyromegaly noted. Cardiac exam with regular rhythm normal S1 and S2. No murmur or gallop noted. Lungs are clear to auscultation. Abdomen abdomen is full. Bowel sounds are normal. On palpation abdomen is soft. She has vague tenderness in left lower and mid abdomen on superficial and deep palpation. No organomegaly or masses.  No LE edema or clubbing noted.    Assessment:  #1. Chronic constipation. 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 She has tried all medications and believes Amitiza  provides some benefit. #2. Chronic left-sided abdominal pain felt to be musculoskeletal and due to IBS. #3. GERD. She is having heartburn off therapy. #4. 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    Plan:  Patient will try taking two tablets of milk of magnesia daily for 4 weeks rather than on when necessary basis. New prescription given for Nexium 40 mg by mouth every morning. New prescription for Amitiza D4 micrograms by mouth twice a day given. Patient encouraged to eat fiber which foods. She should eat apples and prunes daily in addition to vegetables. Office visit in 6 months. Next colonoscopy in March  2017

## 2014-03-04 NOTE — Patient Instructions (Signed)
Can take Hardin Negus MOM tablets 2 daily for 4 weeks and then when necessary. Dulcolax suppository every third day as needed.

## 2014-03-23 ENCOUNTER — Emergency Department (HOSPITAL_COMMUNITY)
Admission: EM | Admit: 2014-03-23 | Discharge: 2014-03-23 | Disposition: A | Discharge status: Home / Self Care | Payer: BC Managed Care – PPO | Attending: Emergency Medicine | Admitting: Emergency Medicine

## 2014-03-23 ENCOUNTER — Encounter (HOSPITAL_COMMUNITY): Payer: Self-pay | Admitting: Emergency Medicine

## 2014-03-23 DIAGNOSIS — Z87440 Personal history of urinary (tract) infections: Secondary | ICD-10-CM | POA: Insufficient documentation

## 2014-03-23 DIAGNOSIS — Z9889 Other specified postprocedural states: Secondary | ICD-10-CM | POA: Diagnosis not present

## 2014-03-23 DIAGNOSIS — Z87891 Personal history of nicotine dependence: Secondary | ICD-10-CM | POA: Diagnosis not present

## 2014-03-23 DIAGNOSIS — F319 Bipolar disorder, unspecified: Secondary | ICD-10-CM | POA: Diagnosis not present

## 2014-03-23 DIAGNOSIS — F410 Panic disorder [episodic paroxysmal anxiety] without agoraphobia: Secondary | ICD-10-CM | POA: Diagnosis not present

## 2014-03-23 DIAGNOSIS — K219 Gastro-esophageal reflux disease without esophagitis: Secondary | ICD-10-CM | POA: Insufficient documentation

## 2014-03-23 DIAGNOSIS — Z7952 Long term (current) use of systemic steroids: Secondary | ICD-10-CM | POA: Diagnosis not present

## 2014-03-23 DIAGNOSIS — G8929 Other chronic pain: Secondary | ICD-10-CM | POA: Diagnosis not present

## 2014-03-23 DIAGNOSIS — Z872 Personal history of diseases of the skin and subcutaneous tissue: Secondary | ICD-10-CM | POA: Diagnosis not present

## 2014-03-23 DIAGNOSIS — I10 Essential (primary) hypertension: Secondary | ICD-10-CM | POA: Diagnosis not present

## 2014-03-23 DIAGNOSIS — M5416 Radiculopathy, lumbar region: Secondary | ICD-10-CM | POA: Insufficient documentation

## 2014-03-23 DIAGNOSIS — E669 Obesity, unspecified: Secondary | ICD-10-CM | POA: Diagnosis not present

## 2014-03-23 DIAGNOSIS — Z8619 Personal history of other infectious and parasitic diseases: Secondary | ICD-10-CM | POA: Insufficient documentation

## 2014-03-23 DIAGNOSIS — M545 Low back pain, unspecified: Secondary | ICD-10-CM

## 2014-03-23 DIAGNOSIS — Z79899 Other long term (current) drug therapy: Secondary | ICD-10-CM | POA: Diagnosis not present

## 2014-03-23 DIAGNOSIS — J45909 Unspecified asthma, uncomplicated: Secondary | ICD-10-CM | POA: Insufficient documentation

## 2014-03-23 DIAGNOSIS — Z86010 Personal history of colonic polyps: Secondary | ICD-10-CM | POA: Diagnosis not present

## 2014-03-23 MED ORDER — METHYLPREDNISOLONE 4 MG PO KIT: PACK | ORAL | Status: DC

## 2014-03-23 MED ORDER — ONDANSETRON 4 MG PO TBDP
4.0000 mg | ORAL_TABLET | Freq: Once | ORAL | Status: AC
  Administered 2014-03-23: 4 mg via ORAL
  Filled 2014-03-23: qty 1

## 2014-03-23 MED ORDER — MORPHINE SULFATE 10 MG/ML IJ SOLN
10.0000 mg | Freq: Once | INTRAMUSCULAR | Status: AC
  Administered 2014-03-23: 10 mg via INTRAMUSCULAR
  Filled 2014-03-23: qty 1

## 2014-03-23 MED ORDER — PREDNISONE 50 MG PO TABS
60.0000 mg | ORAL_TABLET | Freq: Once | ORAL | Status: AC
  Administered 2014-03-23: 60 mg via ORAL
  Filled 2014-03-23 (×2): qty 1

## 2014-03-23 MED ORDER — METHOCARBAMOL 500 MG PO TABS: 500.0000 mg | ORAL_TABLET | Freq: Three times a day (TID) | ORAL | Status: DC | PRN

## 2014-03-23 NOTE — Discharge Instructions (Signed)
Back Pain, Adult °Back pain is very common. The pain often gets better over time. The cause of back pain is usually not dangerous. Most people can learn to manage their back pain on their own.  °HOME CARE  °· Stay active. Start with short walks on flat ground if you can. Try to walk farther each day. °· Do not sit, drive, or stand in one place for more than 30 minutes. Do not stay in bed. °· Do not avoid exercise or work. Activity can help your back heal faster. °· Be careful when you bend or lift an object. Bend at your knees, keep the object close to you, and do not twist. °· Sleep on a firm mattress. Lie on your side, and bend your knees. If you lie on your back, put a pillow under your knees. °· Only take medicines as told by your doctor. °· Put ice on the injured area. °¨ Put ice in a plastic bag. °¨ Place a towel between your skin and the bag. °¨ Leave the ice on for 15-20 minutes, 03-04 times a day for the first 2 to 3 days. After that, you can switch between ice and heat packs. °· Ask your doctor about back exercises or massage. °· Avoid feeling anxious or stressed. Find good ways to deal with stress, such as exercise. °GET HELP RIGHT AWAY IF:  °· Your pain does not go away with rest or medicine. °· Your pain does not go away in 1 week. °· You have new problems. °· You do not feel well. °· The pain spreads into your legs. °· You cannot control when you poop (bowel movement) or pee (urinate). °· Your arms or legs feel weak or lose feeling (numbness). °· You feel sick to your stomach (nauseous) or throw up (vomit). °· You have belly (abdominal) pain. °· You feel like you may pass out (faint). °MAKE SURE YOU:  °· Understand these instructions. °· Will watch your condition. °· Will get help right away if you are not doing well or get worse. °Document Released: 11/09/2007 Document Revised: 08/15/2011 Document Reviewed: 09/24/2013 °[employer]® Patient Information ©2015 [employer]. This information is not intended  to replace advice given to you by your health care provider. Make sure you discuss any questions you have with your health care provider. ° °

## 2014-03-23 NOTE — ED Provider Notes (Addendum)
CSN: [id number]     Arrival date & time 03/23/14  0630 History  This chart was scribed for Tanna Furry, MD by Tula Nakayama, ED Scribe. This patient was seen in room APA19/APA19 and the patient's care was started at 10:00 AM.    Chief Complaint  Patient presents with  . Back Pain   The history is provided by the patient. No language interpreter was used.   HPI Comments: Sharon Vazquez is a 33 y.o. female with a history of back spasms and a lumbar laminectomy/decompression microdiscectomy who presents to the Emergency Department complaining of chronic, constant, aching lower back pain that started 1 year ago and sharp, left hip pain, and numbness in her left knee and thigh that started 1 day ago. She also states that she feels weaker in left leg due to numbness and compensates with her right leg. Pt states that pain has increased since her last surgery with Dr. Christella Noa on 6/26. Pt has a follow-up appointment with Dr. Christella Noa tomorrow. Pt denies any recent trauma or injury. She states that the pain worsens with bad weather. Pt has IBS and has BM 1-2 times months. She denies incontinence and trouble with ambulation. She currently takes oxycodone for pain symptoms.    Past Medical History  Diagnosis Date  . Hypertension   . Anxiety   . Bipolar 1 disorder   . ADHD (attention deficit hyperactivity disorder)   . Borderline personality disorder   . Panic attack   . Headache(784.0)   . Panic disorder   . HSV-2 (herpes simplex virus 2) infection   . Hx of chlamydia infection   . IBS (irritable bowel syndrome)   . Abnormal Pap smear   . Constipation     takes Dulcolax daily as needed takes Amitiza daily  . Anxiety     takes Xanax daily as needed  . Back spasm     takes Flexeril daily as needed  . Insomnia     takes Ambien nightly  . Hypertension     takes INderal and Clonidine daily  . Shortness of breath     with exertion  . Asthma     Ventolin as needed and QVAR takes daily  .  History of bronchitis     last time 4-81yrs ago  . Headache(784.0)     migraines-last one about 10months ago;takes Topamax daily  . Weakness     numbness and tingling to left foot  . Joint swelling     right knee  . Chronic back pain     HNP  . Eczema     has 2 creams uses as needed  . GERD (gastroesophageal reflux disease)     takes Tums as needed  . Internal hemorrhoids   . History of colon polyps   . Urinary urgency   . Mental disorder     takes Abilify as needed  . Bipolar 1 disorder     takes DOxepin daily  . Obesity   . Contraceptive management 08/23/2013   Past Surgical History  Procedure Laterality Date  . Wisdom tooth extraction    . Bunion removal    . Cholecystectomy  10 yrs ago  . Colonoscopy N/A 08/22/2012    Procedure: COLONOSCOPY;  Surgeon: Rogene Houston, MD;  Location: [facility];  Service: Endoscopy;  Laterality: N/A;  100  . Tonsillectomy    . Cholecystectomy  10+yrs ago  . Tonsillectomy      as a child  .  Bunionectomy Left     pins in big toe and 2nd toe  . Wisdom teeth extracted    . Epidural injections      x 2  . Colonoscopy    . Lumbar laminectomy/decompression microdiscectomy Left 07/03/2013    Procedure: LEFT LUMBAR THREE-FOUR microdiskectomy;  Surgeon: Winfield Cunas, MD;  Location: [facility];  Service: Neurosurgery;  Laterality: Left;  LEFT LUMBAR THREE-FOUR microdiskectomy  . Lumbar laminectomy/decompression microdiscectomy Left 11/29/2013    Procedure: LEFT Lumbar Four-Five Redo microdiskectomy;  Surgeon: Winfield Cunas, MD;  Location: [facility];  Service: Neurosurgery;  Laterality: Left;  LEFT Lumbar Four-Five Redo microdiskectomy  . Back surgery     Family History  Problem Relation Age of Onset  . Hypertension Mother   . Diabetes Mother   . Thyroid disease Mother   . Hypertension Maternal Aunt   . Diabetes Maternal Aunt   . Thyroid disease Maternal Aunt   . Diabetes Maternal Grandmother   . Heart disease Maternal Grandmother      CHF  . Hypertension Maternal Grandmother   . Diabetes Father   . Diabetes Paternal Grandmother   . Crohn's disease Maternal Uncle   . Hypertension Maternal Uncle   . Diabetes Cousin    History  Substance Use Topics  . Smoking status: Former Smoker -- 1.00 packs/day for 9 years    Quit date: 06/07/2011  . Smokeless tobacco: Never Used     Comment: quit 07/06/12  . Alcohol Use: Yes     Comment: rarely   OB History   Grav Para Term Preterm Abortions TAB SAB Ect Mult Living                 Review of Systems  Constitutional: Negative for fever, chills, diaphoresis, appetite change and fatigue.  HENT: Negative for mouth sores, sore throat and trouble swallowing.   Eyes: Negative for visual disturbance.  Respiratory: Negative for cough, chest tightness, shortness of breath and wheezing.   Cardiovascular: Negative for chest pain.  Gastrointestinal: Negative for nausea, vomiting, abdominal pain, diarrhea and abdominal distention.  Endocrine: Negative for polydipsia, polyphagia and polyuria.  Genitourinary: Negative for dysuria, frequency and hematuria.  Musculoskeletal: Positive for arthralgias and back pain. Negative for gait problem.  Skin: Negative for color change, pallor and rash.  Neurological: Positive for numbness. Negative for dizziness, syncope, light-headedness and headaches.  Hematological: Does not bruise/bleed easily.  Psychiatric/Behavioral: Negative for behavioral problems and confusion.     Allergies  Bee venom; Dilaudid; Dilaudid; Latex; Senna; and Adhesive  Home Medications   Prior to Admission medications   Medication Sig Start Date End Date Taking? Authorizing Provider  albuterol (PROVENTIL HFA;VENTOLIN HFA) 108 (90 BASE) MCG/ACT inhaler Inhale 2 puffs into the lungs every 4 (four) hours as needed for wheezing or shortness of breath.     Historical Provider, MD  alprazolam Duanne Moron) 2 MG tablet Take 2 mg by mouth 3 (three) times daily as needed for anxiety  (panic attacks).    Historical Provider, MD  ALUVEA 39 % CREA 2 (two) times daily as needed.  03/03/14   Historical Provider, MD  ARIPiprazole (ABILIFY) 10 MG tablet Take 10 mg by mouth daily.    Historical Provider, MD  beclomethasone (QVAR) 80 MCG/ACT inhaler Inhale 2 puffs into the lungs daily.     Historical Provider, MD  cloNIDine (CATAPRES) 0.1 MG tablet Take 0.1 mg by mouth 2 (two) times daily.    Historical Provider, MD  cyclobenzaprine (FLEXERIL)  10 MG tablet Take 10 mg by mouth daily.  01/02/14   Historical Provider, MD  Diclofenac Potassium (CAMBIA) 50 MG PACK Take 50 mg by mouth daily as needed (migraines).    Historical Provider, MD  doxepin (SINEQUAN) 50 MG capsule Take 50 mg by mouth See admin instructions. Takes one capsule twice a day and one at bedtime if needed    Historical Provider, MD  EPINEPHrine (EPIPEN) 0.3 mg/0.3 mL SOAJ injection Inject 0.3 mg into the muscle as needed (allergic reaction).    Historical Provider, MD  esomeprazole (NEXIUM) 40 MG capsule Take 1 capsule (40 mg total) by mouth daily at 12 noon. 03/04/14   Rogene Houston, MD  fexofenadine (ALLEGRA) 180 MG tablet Take 180 mg by mouth daily.    Historical Provider, MD  lubiprostone (AMITIZA) 24 MCG capsule Take 1 capsule (24 mcg total) by mouth 2 (two) times daily with a meal. 03/04/14   Rogene Houston, MD  methocarbamol (ROBAXIN) 500 MG tablet Take 1 tablet (500 mg total) by mouth 3 (three) times daily between meals as needed. 03/23/14   Tanna Furry, MD  methylPREDNISolone (MEDROL DOSEPAK) 4 MG tablet X 6 days as directed 03/23/14   Tanna Furry, MD  norethindrone (HEATHER) 0.35 MG tablet Take 1 tablet by mouth daily.    Historical Provider, MD  nystatin (MYCOSTATIN/NYSTOP) 100000 UNIT/GM POWD Apply topically See admin instructions. Apply two-three times a day to affected area as needed    Historical Provider, MD  nystatin-triamcinolone (MYCOLOG II) cream Apply 1 application topically 2 (two) times daily as needed  (hemorrhoids).  08/22/12   Rogene Houston, MD  Olopatadine HCl (PATADAY) 0.2 % SOLN Place 1 drop into both eyes daily as needed (itchy eyes).    Historical Provider, MD  oxyCODONE-acetaminophen (ROXICET) 5-325 MG per tablet Take 1 tablet by mouth every 6 (six) hours as needed for severe pain. 11/30/13   Ashok Pall, MD  pimecrolimus (ELIDEL) 1 % cream Apply 1 application topically 2 (two) times daily as needed (facial rash).    Historical Provider, MD  promethazine (PHENERGAN) 25 MG tablet Take 25 mg by mouth every 6 (six) hours as needed for nausea or vomiting.    Historical Provider, MD  propranolol (INDERAL) 10 MG tablet Take 10 mg by mouth 3 (three) times daily.    Historical Provider, MD  topiramate (TOPAMAX) 25 MG tablet Take 50 mg by mouth 2 (two) times daily.     Historical Provider, MD  zolpidem (AMBIEN CR) 12.5 MG CR tablet Take 12.5 mg by mouth at bedtime as needed for sleep.    Historical Provider, MD   BP 114/68  Pulse 97  Temp(Src) 97.6 F (36.4 C) (Oral)  Resp 20  Ht 5\' 6"  (1.676 m)  Wt 215 lb (97.523 kg)  BMI 34.72 kg/m2  SpO2 99%  LMP 03/06/2014 Physical Exam  Nursing note and vitals reviewed. Constitutional: She is oriented to person, place, and time. She appears well-developed and well-nourished. No distress.  HENT:  Head: Normocephalic.  Eyes: Conjunctivae are normal. Pupils are equal, round, and reactive to light. No scleral icterus.  Neck: Normal range of motion. Neck supple. No thyromegaly present.  Cardiovascular: Normal rate and regular rhythm.  Exam reveals no gallop and no friction rub.   No murmur heard. Pulmonary/Chest: Effort normal and breath sounds normal. No respiratory distress. She has no wheezes. She has no rales.  Abdominal: Soft. Bowel sounds are normal. She exhibits no distension. There is no tenderness.  There is no rebound.  Musculoskeletal: Normal range of motion.  Pt reacts to back pain when t-shirt is touched  Neurological: She is alert and  oriented to person, place, and time.  Normal symmetric strength to flex/.extend hip and knees, dorsi/plantar flex ankles. Normal symmetric sensation to all distributions to LEs Patellar and achilles reflexes 1-2+. Downgoing Babinski   Skin: Skin is warm and dry. No rash noted.  Psychiatric: She has a normal mood and affect. Her behavior is normal.    ED Course  Procedures (including critical care time) DIAGNOSTIC STUDIES: Oxygen Saturation is 99% on RA, normal by my interpretation.    COORDINATION OF CARE: 10:06 AM Discussed treatment plan with pt at bedside and pt agreed to plan.  Labs Review Labs Reviewed - No data to display  Imaging Review No results found.   EKG Interpretation None      MDM   Final diagnoses:  Left-sided low back pain without sciatica  Radiculopathy of lumbar region    Patient underwent left L3-4 or decompression in June of this year. Has continued to have symptoms. States she has an appointment with her neurosurgeon Dr. Cyndy Freeze at 9:30 tomorrow morning. Is also seeking pain management. No signs of acute herniated nucleus or neurological loss today on exam, or by history. Plan will be symptom control and discharge for AM followup.  I personally performed the services described in this documentation, which was scribed in my presence. The recorded information has been reviewed and is accurate.    Tanna Furry, MD 03/23/14 [place], MD 03/23/14 1024

## 2014-03-23 NOTE — ED Notes (Signed)
PT reports having an appointment in the am with back surgeon for follow up.

## 2014-03-23 NOTE — ED Notes (Signed)
PT c/o chronic lower back pain x1 year with two lumbar surgeries. PT reports increased in pain that shoots down her left leg x1 day. PT last oxycodone 5/325 around 2230 yesterday.

## 2014-03-31 ENCOUNTER — Telehealth: Payer: Self-pay | Admitting: Adult Health

## 2014-03-31 NOTE — Telephone Encounter (Signed)
Spoke with pt letting her know she would need an appt to change birth control. Pt voiced understanding. Call transferred to front desk. [place]

## 2014-04-02 ENCOUNTER — Ambulatory Visit (INDEPENDENT_AMBULATORY_CARE_PROVIDER_SITE_OTHER): Payer: BC Managed Care – PPO | Admitting: Adult Health

## 2014-04-02 ENCOUNTER — Encounter: Payer: Self-pay | Admitting: Adult Health

## 2014-04-02 VITALS — BP 108/80 | Ht 67.0 in | Wt 212.5 lb

## 2014-04-02 DIAGNOSIS — N926 Irregular menstruation, unspecified: Secondary | ICD-10-CM

## 2014-04-02 DIAGNOSIS — Z30013 Encounter for initial prescription of injectable contraceptive: Secondary | ICD-10-CM

## 2014-04-02 DIAGNOSIS — Z3202 Encounter for pregnancy test, result negative: Secondary | ICD-10-CM

## 2014-04-02 HISTORY — DX: Irregular menstruation, unspecified: N92.6

## 2014-04-02 LAB — POCT URINE PREGNANCY: PREG TEST UR: NEGATIVE

## 2014-04-02 MED ORDER — MEDROXYPROGESTERONE ACETATE 150 MG/ML IM SUSP: 150.0000 mg | INTRAMUSCULAR | Status: DC

## 2014-04-02 NOTE — Progress Notes (Signed)
Subjective:     Patient ID: Sharon Vazquez, female   DOB: 1980/08/08, 33 y.o.   MRN: [id number]  HPI Marylen is a 33 year old black female on OCs complaining of irregular bleeding, will start period before it is due in pill pack and her back hurts some when has period and already has chronic pain and wants to get on depo so will not have a period.  Review of Systems See HPI Reviewed past medical,surgical, social and family history. Reviewed medications and allergies.     Objective:   Physical Exam BP 108/80  Ht 5\' 7"  (1.702 m)  Wt 212 lb 8 oz (96.389 kg)  BMI 33.27 kg/m2  LMP 03/06/2014   UPT negative, Skin warm and dry.Pelvic: external genitalia is normal in appearance, no lesions, vagina: scant discharge without odor, cervix:smooth, except for 1 nabothian cyst,, uterus: normal size, shape and contour, non tender, no masses felt, adnexa: no masses or tenderness noted. GC/CHL obtained.   Assessment:     Irregular periods Contraceptive management    Plan:    GC/CHL sent Rx Depo provera 150 mg, disp.# 1 vail for IM injection every 3 months in office with 4 refills Review handout on depo  Call with next period to get first injection of depo

## 2014-04-02 NOTE — Patient Instructions (Signed)

## 2014-04-03 ENCOUNTER — Ambulatory Visit (INDEPENDENT_AMBULATORY_CARE_PROVIDER_SITE_OTHER): Payer: BC Managed Care – PPO | Admitting: Adult Health

## 2014-04-03 ENCOUNTER — Encounter: Payer: Self-pay | Admitting: Adult Health

## 2014-04-03 ENCOUNTER — Encounter (HOSPITAL_COMMUNITY): Payer: Self-pay | Admitting: Emergency Medicine

## 2014-04-03 ENCOUNTER — Emergency Department (HOSPITAL_COMMUNITY)
Admission: EM | Admit: 2014-04-03 | Discharge: 2014-04-03 | Disposition: A | Discharge status: Home / Self Care | Payer: BC Managed Care – PPO | Attending: Emergency Medicine | Admitting: Emergency Medicine

## 2014-04-03 DIAGNOSIS — J45909 Unspecified asthma, uncomplicated: Secondary | ICD-10-CM | POA: Diagnosis not present

## 2014-04-03 DIAGNOSIS — M5416 Radiculopathy, lumbar region: Secondary | ICD-10-CM | POA: Diagnosis not present

## 2014-04-03 DIAGNOSIS — Z791 Long term (current) use of non-steroidal anti-inflammatories (NSAID): Secondary | ICD-10-CM | POA: Insufficient documentation

## 2014-04-03 DIAGNOSIS — I10 Essential (primary) hypertension: Secondary | ICD-10-CM | POA: Diagnosis not present

## 2014-04-03 DIAGNOSIS — Z9889 Other specified postprocedural states: Secondary | ICD-10-CM | POA: Diagnosis not present

## 2014-04-03 DIAGNOSIS — R200 Anesthesia of skin: Secondary | ICD-10-CM | POA: Diagnosis not present

## 2014-04-03 DIAGNOSIS — F410 Panic disorder [episodic paroxysmal anxiety] without agoraphobia: Secondary | ICD-10-CM | POA: Diagnosis not present

## 2014-04-03 DIAGNOSIS — Z86010 Personal history of colonic polyps: Secondary | ICD-10-CM | POA: Insufficient documentation

## 2014-04-03 DIAGNOSIS — Z3202 Encounter for pregnancy test, result negative: Secondary | ICD-10-CM

## 2014-04-03 DIAGNOSIS — Z8742 Personal history of other diseases of the female genital tract: Secondary | ICD-10-CM | POA: Diagnosis not present

## 2014-04-03 DIAGNOSIS — Z91040 Latex allergy status: Secondary | ICD-10-CM | POA: Insufficient documentation

## 2014-04-03 DIAGNOSIS — Z8619 Personal history of other infectious and parasitic diseases: Secondary | ICD-10-CM | POA: Diagnosis not present

## 2014-04-03 DIAGNOSIS — Z872 Personal history of diseases of the skin and subcutaneous tissue: Secondary | ICD-10-CM | POA: Diagnosis not present

## 2014-04-03 DIAGNOSIS — G8929 Other chronic pain: Secondary | ICD-10-CM | POA: Insufficient documentation

## 2014-04-03 DIAGNOSIS — F909 Attention-deficit hyperactivity disorder, unspecified type: Secondary | ICD-10-CM | POA: Diagnosis not present

## 2014-04-03 DIAGNOSIS — Z79899 Other long term (current) drug therapy: Secondary | ICD-10-CM | POA: Insufficient documentation

## 2014-04-03 DIAGNOSIS — E669 Obesity, unspecified: Secondary | ICD-10-CM | POA: Insufficient documentation

## 2014-04-03 DIAGNOSIS — Z7952 Long term (current) use of systemic steroids: Secondary | ICD-10-CM | POA: Insufficient documentation

## 2014-04-03 DIAGNOSIS — K219 Gastro-esophageal reflux disease without esophagitis: Secondary | ICD-10-CM | POA: Insufficient documentation

## 2014-04-03 DIAGNOSIS — F319 Bipolar disorder, unspecified: Secondary | ICD-10-CM | POA: Diagnosis not present

## 2014-04-03 DIAGNOSIS — M545 Low back pain: Secondary | ICD-10-CM | POA: Diagnosis present

## 2014-04-03 DIAGNOSIS — Z3042 Encounter for surveillance of injectable contraceptive: Secondary | ICD-10-CM

## 2014-04-03 LAB — GC/CHLAMYDIA PROBE AMP
CT Probe RNA: NEGATIVE
GC PROBE AMP APTIMA: NEGATIVE

## 2014-04-03 LAB — POCT URINE PREGNANCY: PREG TEST UR: NEGATIVE

## 2014-04-03 MED ORDER — MEDROXYPROGESTERONE ACETATE 150 MG/ML IM SUSP
150.0000 mg | Freq: Once | INTRAMUSCULAR | Status: AC
  Administered 2014-04-03: 150 mg via INTRAMUSCULAR

## 2014-04-03 MED ORDER — ONDANSETRON 8 MG PO TBDP
8.0000 mg | ORAL_TABLET | Freq: Once | ORAL | Status: AC
  Administered 2014-04-03: 8 mg via ORAL
  Filled 2014-04-03: qty 1

## 2014-04-03 MED ORDER — METHOCARBAMOL 500 MG PO TABS: 500.0000 mg | ORAL_TABLET | Freq: Three times a day (TID) | ORAL | Status: DC

## 2014-04-03 MED ORDER — MORPHINE SULFATE 10 MG/ML IJ SOLN
10.0000 mg | Freq: Once | INTRAMUSCULAR | Status: AC
  Administered 2014-04-03: 10 mg via INTRAMUSCULAR
  Filled 2014-04-03: qty 1

## 2014-04-03 NOTE — ED Provider Notes (Signed)
CSN: [id number]     Arrival date & time 04/03/14  2011 History   First MD Initiated Contact with Patient 04/03/14 2036     Chief Complaint  Patient presents with  . Back Pain     (Consider location/radiation/quality/duration/timing/severity/associated sxs/prior Treatment) HPI  Terrill Wauters Danelle Earthly is a 33 y.o. female with h/o lumbar laminectomy/decompression microdiscectomy   who presents to the Emergency Department complaining of persistent left low back and hip pain which is chronic. She also reports numbness and pain to the left thigh which is also chronic.  Patient states she was seen on Monday of this week by her neurosurgeon, Dr. Cyndy Freeze and was given prescription for Percocet which she states is not controlling her pain. She states that she is planning to have a neurostimulator implanted in hopes of better pain control.  She states the pain has been worse today. Patient also states that she had a recent MRI of her back done at Dr. Hewitt Shorts office.  She denies urinary symptoms, abdominal pain, incontinence of bowel or bladder    Past Medical History  Diagnosis Date  . Hypertension   . Anxiety   . Bipolar 1 disorder   . ADHD (attention deficit hyperactivity disorder)   . Borderline personality disorder   . Panic attack   . Headache(784.0)   . Panic disorder   . HSV-2 (herpes simplex virus 2) infection   . Hx of chlamydia infection   . IBS (irritable bowel syndrome)   . Abnormal Pap smear   . Constipation     takes Dulcolax daily as needed takes Amitiza daily  . Anxiety     takes Xanax daily as needed  . Back spasm     takes Flexeril daily as needed  . Insomnia     takes Ambien nightly  . Hypertension     takes INderal and Clonidine daily  . Shortness of breath     with exertion  . Asthma     Ventolin as needed and QVAR takes daily  . History of bronchitis     last time 4-85yrs ago  . Headache(784.0)     migraines-last one about 26months ago;takes Topamax daily  .  Weakness     numbness and tingling to left foot  . Joint swelling     right knee  . Chronic back pain     HNP  . Eczema     has 2 creams uses as needed  . GERD (gastroesophageal reflux disease)     takes Tums as needed  . Internal hemorrhoids   . History of colon polyps   . Urinary urgency   . Mental disorder     takes Abilify as needed  . Bipolar 1 disorder     takes DOxepin daily  . Obesity   . Contraceptive management 08/23/2013  . Irregular periods 04/02/2014   Past Surgical History  Procedure Laterality Date  . Wisdom tooth extraction    . Bunion removal    . Cholecystectomy  10 yrs ago  . Colonoscopy N/A 08/22/2012    Procedure: COLONOSCOPY;  Surgeon: Rogene Houston, MD;  Location: [facility];  Service: Endoscopy;  Laterality: N/A;  100  . Tonsillectomy    . Cholecystectomy  10+yrs ago  . Tonsillectomy      as a child  . Bunionectomy Left     pins in big toe and 2nd toe  . Wisdom teeth extracted    . Epidural injections  x 2  . Colonoscopy    . Lumbar laminectomy/decompression microdiscectomy Left 07/03/2013    Procedure: LEFT LUMBAR THREE-FOUR microdiskectomy;  Surgeon: Winfield Cunas, MD;  Location: [facility];  Service: Neurosurgery;  Laterality: Left;  LEFT LUMBAR THREE-FOUR microdiskectomy  . Lumbar laminectomy/decompression microdiscectomy Left 11/29/2013    Procedure: LEFT Lumbar Four-Five Redo microdiskectomy;  Surgeon: Winfield Cunas, MD;  Location: [facility];  Service: Neurosurgery;  Laterality: Left;  LEFT Lumbar Four-Five Redo microdiskectomy  . Back surgery     Family History  Problem Relation Age of Onset  . Hypertension Mother   . Diabetes Mother   . Thyroid disease Mother   . Other Mother     PTSD  . Hypertension Maternal Aunt   . Diabetes Maternal Aunt   . Thyroid disease Maternal Aunt   . Diabetes Maternal Grandmother   . Heart disease Maternal Grandmother     CHF  . Hypertension Maternal Grandmother   . Diabetes Father   .  Hypertension Father   . Obesity Father   . Cancer Paternal Grandmother     breast  . Crohn's disease Maternal Uncle   . Hyperlipidemia Maternal Uncle   . Diabetes Cousin    History  Substance Use Topics  . Smoking status: Former Smoker -- 1.00 packs/day for 9 years    Types: Cigarettes    Quit date: 06/07/2011  . Smokeless tobacco: Never Used     Comment: quit 07/06/12  . Alcohol Use: Yes     Comment: rarely   OB History   Grav Para Term Preterm Abortions TAB SAB Ect Mult Living   0              Review of Systems  Constitutional: Negative for fever.  Respiratory: Negative for shortness of breath.   Gastrointestinal: Negative for vomiting, abdominal pain and constipation.  Genitourinary: Negative for dysuria, hematuria, flank pain, decreased urine volume and difficulty urinating.       Low back pain  Musculoskeletal: Positive for back pain. Negative for joint swelling and neck pain.  Skin: Negative for rash.  Neurological: Positive for numbness. Negative for dizziness, syncope and weakness.  All other systems reviewed and are negative.     Allergies  Bee venom; Dilaudid; Dilaudid; Latex; Senna; Adhesive; and Hydromorphone  Home Medications   Prior to Admission medications   Medication Sig Start Date End Date Taking? Authorizing Provider  albuterol (PROVENTIL HFA;VENTOLIN HFA) 108 (90 BASE) MCG/ACT inhaler Inhale 2 puffs into the lungs every 4 (four) hours as needed for wheezing or shortness of breath.     Historical Provider, MD  alprazolam Duanne Moron) 2 MG tablet Take 2 mg by mouth 3 (three) times daily as needed for anxiety (panic attacks).    Historical Provider, MD  ALUVEA 39 % CREA 2 (two) times daily as needed.  03/03/14   Historical Provider, MD  ARIPiprazole (ABILIFY) 10 MG tablet Take 10 mg by mouth daily.    Historical Provider, MD  beclomethasone (QVAR) 80 MCG/ACT inhaler Inhale 2 puffs into the lungs daily.     Historical Provider, MD  cloNIDine (CATAPRES) 0.1 MG  tablet Take 0.1 mg by mouth 2 (two) times daily.    Historical Provider, MD  Diclofenac Potassium (CAMBIA) 50 MG PACK Take 50 mg by mouth daily as needed (migraines).    Historical Provider, MD  doxepin (SINEQUAN) 50 MG capsule Take 50 mg by mouth See admin instructions. Takes one capsule twice a day and one  at bedtime if needed    Historical Provider, MD  EPINEPHrine (EPIPEN) 0.3 mg/0.3 mL SOAJ injection Inject 0.3 mg into the muscle as needed (allergic reaction).    Historical Provider, MD  esomeprazole (NEXIUM) 40 MG capsule Take 1 capsule (40 mg total) by mouth daily at 12 noon. 03/04/14   Rogene Houston, MD  fexofenadine (ALLEGRA) 180 MG tablet Take 180 mg by mouth daily.    Historical Provider, MD  lubiprostone (AMITIZA) 24 MCG capsule Take 1 capsule (24 mcg total) by mouth 2 (two) times daily with a meal. 03/04/14   Rogene Houston, MD  medroxyPROGESTERone (DEPO-PROVERA) 150 MG/ML injection Inject 1 mL (150 mg total) into the muscle every 3 (three) months. 04/02/14   Estill Dooms, NP  methocarbamol (ROBAXIN) 500 MG tablet Take 1 tablet (500 mg total) by mouth 3 (three) times daily between meals as needed. 03/23/14   Tanna Furry, MD  Mometasone Furoate (NASONEX NA) by Nasal route.    Historical Provider, MD  nystatin (MYCOSTATIN/NYSTOP) 100000 UNIT/GM POWD Apply topically See admin instructions. Apply two-three times a day to affected area as needed    Historical Provider, MD  Olopatadine HCl (PATADAY) 0.2 % SOLN Place 1 drop into both eyes daily as needed (itchy eyes).    Historical Provider, MD  oxyCODONE-acetaminophen (ROXICET) 5-325 MG per tablet Take 1 tablet by mouth every 6 (six) hours as needed for severe pain. 11/30/13   Ashok Pall, MD  pimecrolimus (ELIDEL) 1 % cream Apply 1 application topically 2 (two) times daily as needed (facial rash).    Historical Provider, MD  promethazine (PHENERGAN) 25 MG tablet Take 25 mg by mouth every 6 (six) hours as needed for nausea or vomiting.     Historical Provider, MD  propranolol (INDERAL) 10 MG tablet Take 10 mg by mouth 3 (three) times daily.    Historical Provider, MD  topiramate (TOPAMAX) 25 MG tablet Take 50 mg by mouth 2 (two) times daily.     Historical Provider, MD  zolpidem (AMBIEN CR) 12.5 MG CR tablet Take 12.5 mg by mouth at bedtime as needed for sleep.    Historical Provider, MD   BP 116/77  Pulse 107  Temp(Src) 98.2 F (36.8 C) (Oral)  Resp 20  Ht 5\' 7"  (1.702 m)  Wt 211 lb (95.709 kg)  BMI 33.04 kg/m2  SpO2 100%  LMP 04/02/2014 Physical Exam  Nursing note and vitals reviewed. Constitutional: She is oriented to person, place, and time. She appears well-developed and well-nourished. No distress.  HENT:  Head: Normocephalic and atraumatic.  Neck: Normal range of motion. Neck supple.  Cardiovascular: Normal rate, regular rhythm, normal heart sounds and intact distal pulses.   No murmur heard. Pulmonary/Chest: Effort normal and breath sounds normal. No respiratory distress.  Abdominal: Soft. She exhibits no distension. There is no tenderness. There is no rebound and no guarding.  Musculoskeletal: She exhibits tenderness. She exhibits no edema.       Lumbar back: She exhibits tenderness and pain. She exhibits normal range of motion, no swelling, no deformity, no laceration and normal pulse.  Patient grimacing to slight touch of the left lateral hip or low back.  DP pulses are brisk and symmetrical.  Distal sensation intact.  Hip Flexors/Extensors are intact.  Pt has 5/5 strength against resistance of bilateral lower extremities.     Neurological: She is alert and oriented to person, place, and time. She has normal strength. No sensory deficit. She exhibits normal muscle tone.  Coordination and gait normal.  Reflex Scores:      Patellar reflexes are 2+ on the right side and 2+ on the left side.      Achilles reflexes are 2+ on the right side and 2+ on the left side. Skin: Skin is warm and dry. No rash noted.     ED Course  Procedures (including critical care time) Labs Review Labs Reviewed - No data to display  Imaging Review No results found.   EKG Interpretation None      MDM   Final diagnoses:  None   patient seen here recently for same, ED chart reviewed by me.   Patient appears uncomfortable but nontoxic. Vital signs are stable. She ambulates with a slow but steady gait. No concerning symptoms for emergent neurological or infectious process. Patient is here this evening requesting pain control. She agrees to continue her Percocet Rx given for robaxin and to contact her neurosurgeon tomorrow for follow-up.    Vale Peraza L. Vanessa [place], PA-C 04/03/14 2114

## 2014-04-03 NOTE — Discharge Instructions (Signed)

## 2014-04-03 NOTE — ED Notes (Signed)
Pt seen and evaluated by EDPa for initial assessment. 

## 2014-04-03 NOTE — ED Notes (Signed)
Low back pain with radiation down both legs.  Lt hip hurts.

## 2014-04-03 NOTE — ED Provider Notes (Signed)
Medical screening examination/treatment/procedure(s) were performed by non-physician practitioner and as supervising physician I was immediately available for consultation/collaboration.   EKG Interpretation None        Maudry Diego, MD 04/03/14 2310

## 2014-04-08 ENCOUNTER — Other Ambulatory Visit (HOSPITAL_COMMUNITY): Payer: Self-pay | Admitting: Neurosurgery

## 2014-04-16 ENCOUNTER — Encounter (HOSPITAL_COMMUNITY)
Admission: RE | Admit: 2014-04-16 | Discharge: 2014-04-16 | Disposition: A | Discharge status: Home / Self Care | Payer: BC Managed Care – PPO | Source: Ambulatory Visit | Attending: Neurosurgery | Admitting: Neurosurgery

## 2014-04-16 ENCOUNTER — Encounter (HOSPITAL_COMMUNITY): Payer: Self-pay

## 2014-04-16 DIAGNOSIS — M5416 Radiculopathy, lumbar region: Secondary | ICD-10-CM | POA: Insufficient documentation

## 2014-04-16 DIAGNOSIS — Z01812 Encounter for preprocedural laboratory examination: Secondary | ICD-10-CM | POA: Diagnosis not present

## 2014-04-16 LAB — BASIC METABOLIC PANEL
ANION GAP: 14 (ref 5–15)
BUN: 5 mg/dL — ABNORMAL LOW (ref 6–23)
CO2: 21 meq/L (ref 19–32)
Calcium: 9 mg/dL (ref 8.4–10.5)
Chloride: 105 mEq/L (ref 96–112)
Creatinine, Ser: 0.88 mg/dL (ref 0.50–1.10)
GFR calc Af Amer: >90 mL/min (ref >90)
GFR calc non Af Amer: 85 mL/min — ABNORMAL LOW (ref >90)
Glucose, Bld: 77 mg/dL (ref 70–99)
Potassium: 4.2 mEq/L (ref 3.7–5.3)
SODIUM: 140 meq/L (ref 137–147)

## 2014-04-16 LAB — HCG, SERUM, QUALITATIVE: Preg, Serum: NEGATIVE

## 2014-04-16 LAB — CBC
HCT: 39.7 % (ref 36.0–46.0)
Hemoglobin: 14.2 g/dL (ref 12.0–15.0)
MCH: 31.1 pg (ref 26.0–34.0)
MCHC: 35.8 g/dL (ref 30.0–36.0)
MCV: 87.1 fL (ref 78.0–100.0)
Platelets: 410 10*3/uL — ABNORMAL HIGH (ref 150–400)
RBC: 4.56 MIL/uL (ref 3.87–5.11)
RDW: 13.4 % (ref 11.5–15.5)
WBC: 12.4 10*3/uL — AB (ref 4.0–10.5)

## 2014-04-16 LAB — SURGICAL PCR SCREEN
MRSA, PCR: NEGATIVE
Staphylococcus aureus: NEGATIVE

## 2014-04-16 NOTE — Pre-Procedure Instructions (Signed)
Sharon Vazquez  04/16/2014   Your procedure is scheduled on:  04/23/14  Report to [facility] Admitting at 11 AM.  Call this number if you have problems the morning of surgery: [phone]   Remember:   Do not eat food or drink liquids after midnight.   Take these medicines the morning of surgery with A SIP OF WATER: all inhalers,xanax,nexium,robaxin,oxycodone,inderal,topamax   Do not wear jewelry, make-up or nail polish.  Do not wear lotions, powders, or perfumes. You may wear deodorant.  Do not shave 48 hours prior to surgery. Men may shave face and neck.  Do not bring valuables to the hospital.  [facility] is not responsible                  for any belongings or valuables.               Contacts, dentures or bridgework may not be worn into surgery.  Leave suitcase in the car. After surgery it may be brought to your room.  For patients admitted to the hospital, discharge time is determined by your                treatment team.               Patients discharged the day of surgery will not be allowed to drive  home.  Name and phone number of your driver: family  Special Instructions: Shower using CHG 2 nights before surgery and the night before surgery.  If you shower the day of surgery use CHG.  Use special wash - you have one bottle of CHG for all showers.  You should use approximately 1/3 of the bottle for each shower.   Please read over the following fact sheets that you were given: Pain Booklet, Coughing and Deep Breathing and Surgical Site Infection Prevention

## 2014-04-22 MED ORDER — CEFAZOLIN SODIUM-DEXTROSE 2-3 GM-% IV SOLR
2.0000 g | INTRAVENOUS | Status: AC
  Administered 2014-04-23: 2 g via INTRAVENOUS
  Filled 2014-04-22: qty 50

## 2014-04-23 ENCOUNTER — Ambulatory Visit (HOSPITAL_COMMUNITY)
Admission: RE | Admit: 2014-04-23 | Discharge: 2014-04-23 | Disposition: A | Discharge status: Home / Self Care | Payer: BC Managed Care – PPO | Source: Ambulatory Visit | Attending: Neurosurgery | Admitting: Neurosurgery

## 2014-04-23 ENCOUNTER — Ambulatory Visit (HOSPITAL_COMMUNITY): Payer: BC Managed Care – PPO | Admitting: Anesthesiology

## 2014-04-23 ENCOUNTER — Ambulatory Visit (HOSPITAL_COMMUNITY): Payer: BC Managed Care – PPO

## 2014-04-23 ENCOUNTER — Encounter (HOSPITAL_COMMUNITY)
Admission: RE | Disposition: A | Discharge status: Home / Self Care | Payer: Self-pay | Source: Ambulatory Visit | Attending: Neurosurgery

## 2014-04-23 ENCOUNTER — Encounter (HOSPITAL_COMMUNITY): Payer: Self-pay | Admitting: *Deleted

## 2014-04-23 DIAGNOSIS — E669 Obesity, unspecified: Secondary | ICD-10-CM | POA: Insufficient documentation

## 2014-04-23 DIAGNOSIS — B009 Herpesviral infection, unspecified: Secondary | ICD-10-CM | POA: Diagnosis not present

## 2014-04-23 DIAGNOSIS — J45909 Unspecified asthma, uncomplicated: Secondary | ICD-10-CM | POA: Diagnosis not present

## 2014-04-23 DIAGNOSIS — M5410 Radiculopathy, site unspecified: Secondary | ICD-10-CM | POA: Insufficient documentation

## 2014-04-23 DIAGNOSIS — I10 Essential (primary) hypertension: Secondary | ICD-10-CM | POA: Insufficient documentation

## 2014-04-23 DIAGNOSIS — K219 Gastro-esophageal reflux disease without esophagitis: Secondary | ICD-10-CM | POA: Diagnosis not present

## 2014-04-23 DIAGNOSIS — K589 Irritable bowel syndrome without diarrhea: Secondary | ICD-10-CM | POA: Diagnosis not present

## 2014-04-23 DIAGNOSIS — K5900 Constipation, unspecified: Secondary | ICD-10-CM | POA: Insufficient documentation

## 2014-04-23 DIAGNOSIS — Z87891 Personal history of nicotine dependence: Secondary | ICD-10-CM | POA: Diagnosis not present

## 2014-04-23 DIAGNOSIS — Z91040 Latex allergy status: Secondary | ICD-10-CM | POA: Diagnosis not present

## 2014-04-23 DIAGNOSIS — F319 Bipolar disorder, unspecified: Secondary | ICD-10-CM | POA: Diagnosis not present

## 2014-04-23 DIAGNOSIS — G4700 Insomnia, unspecified: Secondary | ICD-10-CM | POA: Diagnosis not present

## 2014-04-23 DIAGNOSIS — F909 Attention-deficit hyperactivity disorder, unspecified type: Secondary | ICD-10-CM | POA: Insufficient documentation

## 2014-04-23 DIAGNOSIS — Z79899 Other long term (current) drug therapy: Secondary | ICD-10-CM | POA: Diagnosis not present

## 2014-04-23 DIAGNOSIS — G8929 Other chronic pain: Secondary | ICD-10-CM | POA: Insufficient documentation

## 2014-04-23 DIAGNOSIS — F419 Anxiety disorder, unspecified: Secondary | ICD-10-CM | POA: Diagnosis not present

## 2014-04-23 DIAGNOSIS — F410 Panic disorder [episodic paroxysmal anxiety] without agoraphobia: Secondary | ICD-10-CM | POA: Diagnosis not present

## 2014-04-23 DIAGNOSIS — L309 Dermatitis, unspecified: Secondary | ICD-10-CM | POA: Diagnosis not present

## 2014-04-23 DIAGNOSIS — Z91030 Bee allergy status: Secondary | ICD-10-CM | POA: Diagnosis not present

## 2014-04-23 DIAGNOSIS — F603 Borderline personality disorder: Secondary | ICD-10-CM | POA: Insufficient documentation

## 2014-04-23 DIAGNOSIS — M549 Dorsalgia, unspecified: Secondary | ICD-10-CM | POA: Insufficient documentation

## 2014-04-23 DIAGNOSIS — M5416 Radiculopathy, lumbar region: Secondary | ICD-10-CM | POA: Diagnosis present

## 2014-04-23 DIAGNOSIS — Z885 Allergy status to narcotic agent status: Secondary | ICD-10-CM | POA: Diagnosis not present

## 2014-04-23 HISTORY — PX: SPINAL CORD STIMULATOR TRIAL: SHX5380

## 2014-04-23 SURGERY — LUMBAR SPINAL CORD STIMULATOR TRIAL
Anesthesia: General | Site: Spine Thoracic

## 2014-04-23 MED ORDER — ROCURONIUM BROMIDE 100 MG/10ML IV SOLN
INTRAVENOUS | Status: DC | PRN
  Administered 2014-04-23: 40 mg via INTRAVENOUS

## 2014-04-23 MED ORDER — MIDAZOLAM HCL 5 MG/5ML IJ SOLN
INTRAMUSCULAR | Status: DC | PRN
  Administered 2014-04-23: 2 mg via INTRAVENOUS

## 2014-04-23 MED ORDER — KETOROLAC TROMETHAMINE 30 MG/ML IJ SOLN
30.0000 mg | Freq: Four times a day (QID) | INTRAMUSCULAR | Status: DC
  Administered 2014-04-23: 30 mg via INTRAVENOUS
  Filled 2014-04-23: qty 1

## 2014-04-23 MED ORDER — PROMETHAZINE HCL 25 MG PO TABS: 25.0000 mg | ORAL_TABLET | Freq: Four times a day (QID) | ORAL | Status: DC | PRN

## 2014-04-23 MED ORDER — FENTANYL CITRATE 0.05 MG/ML IJ SOLN
INTRAMUSCULAR | Status: DC | PRN
  Administered 2014-04-23: 100 ug via INTRAVENOUS
  Administered 2014-04-23: 50 ug via INTRAVENOUS
  Administered 2014-04-23: 100 ug via INTRAVENOUS

## 2014-04-23 MED ORDER — NEOSTIGMINE METHYLSULFATE 10 MG/10ML IV SOLN
INTRAVENOUS | Status: DC | PRN
  Administered 2014-04-23: 3 mg via INTRAVENOUS

## 2014-04-23 MED ORDER — ALPRAZOLAM 1 MG PO TABS: 2.0000 mg | ORAL_TABLET | Freq: Three times a day (TID) | ORAL | Status: DC | PRN

## 2014-04-23 MED ORDER — FENTANYL CITRATE 0.05 MG/ML IJ SOLN
INTRAMUSCULAR | Status: AC
  Filled 2014-04-23: qty 5

## 2014-04-23 MED ORDER — OXYCODONE HCL 5 MG PO TABS: 5.0000 mg | ORAL_TABLET | ORAL | Status: DC | PRN

## 2014-04-23 MED ORDER — LACTATED RINGERS IV SOLN
INTRAVENOUS | Status: DC
  Administered 2014-04-23: 12:00:00 via INTRAVENOUS

## 2014-04-23 MED ORDER — ONDANSETRON HCL 4 MG/2ML IJ SOLN
INTRAMUSCULAR | Status: AC
  Filled 2014-04-23: qty 2

## 2014-04-23 MED ORDER — HEMOSTATIC AGENTS (NO CHARGE) OPTIME
TOPICAL | Status: DC | PRN
  Administered 2014-04-23: 1 via TOPICAL

## 2014-04-23 MED ORDER — GLYCOPYRROLATE 0.2 MG/ML IJ SOLN
INTRAMUSCULAR | Status: DC | PRN
  Administered 2014-04-23: 0.4 mg via INTRAVENOUS

## 2014-04-23 MED ORDER — MIDAZOLAM HCL 2 MG/2ML IJ SOLN
INTRAMUSCULAR | Status: AC
  Filled 2014-04-23: qty 2

## 2014-04-23 MED ORDER — ALBUTEROL SULFATE (2.5 MG/3ML) 0.083% IN NEBU: 3.0000 mL | INHALATION_SOLUTION | RESPIRATORY_TRACT | Status: DC | PRN

## 2014-04-23 MED ORDER — LIDOCAINE HCL (CARDIAC) 20 MG/ML IV SOLN
INTRAVENOUS | Status: AC
  Filled 2014-04-23: qty 5

## 2014-04-23 MED ORDER — CLONIDINE HCL 0.1 MG PO TABS
0.1000 mg | ORAL_TABLET | Freq: Two times a day (BID) | ORAL | Status: DC
  Filled 2014-04-23: qty 1

## 2014-04-23 MED ORDER — PROPOFOL 10 MG/ML IV BOLUS
INTRAVENOUS | Status: DC | PRN
Start: 2014-04-23 — End: 2014-04-23
  Administered 2014-04-23: 200 mg via INTRAVENOUS

## 2014-04-23 MED ORDER — ROCURONIUM BROMIDE 50 MG/5ML IV SOLN
INTRAVENOUS | Status: AC
  Filled 2014-04-23: qty 1

## 2014-04-23 MED ORDER — EPINEPHRINE 0.3 MG/0.3ML IJ SOAJ: 0.3000 mg | INTRAMUSCULAR | Status: DC | PRN

## 2014-04-23 MED ORDER — ONDANSETRON HCL 4 MG/2ML IJ SOLN
INTRAMUSCULAR | Status: DC | PRN
  Administered 2014-04-23: 4 mg via INTRAVENOUS

## 2014-04-23 MED ORDER — PROPOFOL 10 MG/ML IV BOLUS
INTRAVENOUS | Status: AC
  Filled 2014-04-23: qty 20

## 2014-04-23 MED ORDER — PROPRANOLOL HCL 10 MG PO TABS
10.0000 mg | ORAL_TABLET | Freq: Three times a day (TID) | ORAL | Status: DC
  Filled 2014-04-23 (×2): qty 1

## 2014-04-23 MED ORDER — 0.9 % SODIUM CHLORIDE (POUR BTL) OPTIME
TOPICAL | Status: DC | PRN
  Administered 2014-04-23: 1000 mL

## 2014-04-23 MED ORDER — THROMBIN 5000 UNITS EX SOLR
CUTANEOUS | Status: DC | PRN
  Administered 2014-04-23 (×2): 5000 [IU] via TOPICAL

## 2014-04-23 MED ORDER — ONDANSETRON HCL 4 MG/2ML IJ SOLN: 4.0000 mg | Freq: Once | INTRAMUSCULAR | Status: DC | PRN

## 2014-04-23 MED ORDER — FENTANYL CITRATE 0.05 MG/ML IJ SOLN: 25.0000 ug | INTRAMUSCULAR | Status: DC | PRN

## 2014-04-23 MED ORDER — LIDOCAINE HCL (CARDIAC) 20 MG/ML IV SOLN
INTRAVENOUS | Status: DC | PRN
  Administered 2014-04-23: 30 mg via INTRAVENOUS

## 2014-04-23 SURGICAL SUPPLY — 71 items
ANCHOR BUMPY INJEX (Anchor) ×1 IMPLANT
BLADE CLIPPER SURG (BLADE) IMPLANT
BUMPY ANCHOR ×1 IMPLANT
BUR MATCHSTICK NEURO 3.0 LAGG (BURR) ×1 IMPLANT
CONT SPEC 4OZ CLIKSEAL STRL BL (MISCELLANEOUS) ×1 IMPLANT
DECANTER SPIKE VIAL GLASS SM (MISCELLANEOUS) ×2 IMPLANT
DRAPE C-ARM 42X72 X-RAY (DRAPES) ×3 IMPLANT
DRAPE C-ARMOR (DRAPES) ×1 IMPLANT
DRAPE CAMERA VIDEO/LASER (DRAPES) ×2 IMPLANT
DRAPE ORTHO SPLIT 77X108 STRL (DRAPES) ×2
DRAPE POUCH INSTRU U-SHP 10X18 (DRAPES) ×2 IMPLANT
DRAPE SURG ORHT 6 SPLT 77X108 (DRAPES) ×2 IMPLANT
DRSG OPSITE 4X5.5 SM (GAUZE/BANDAGES/DRESSINGS) ×5 IMPLANT
DURAPREP 26ML APPLICATOR (WOUND CARE) ×2 IMPLANT
ELECT CAUTERY BLADE 6.4 (BLADE) ×1 IMPLANT
GAUZE SPONGE 4X4 12PLY STRL (GAUZE/BANDAGES/DRESSINGS) ×1 IMPLANT
GLOVE BIO SURGEON STRL SZ 6.5 (GLOVE) IMPLANT
GLOVE BIO SURGEON STRL SZ7 (GLOVE) IMPLANT
GLOVE BIO SURGEON STRL SZ7.5 (GLOVE) IMPLANT
GLOVE BIO SURGEON STRL SZ8 (GLOVE) IMPLANT
GLOVE BIO SURGEON STRL SZ8.5 (GLOVE) IMPLANT
GLOVE BIOGEL M 8.0 STRL (GLOVE) IMPLANT
GLOVE BIOGEL PI IND STRL 8 (GLOVE) IMPLANT
GLOVE BIOGEL PI INDICATOR 8 (GLOVE) ×2
GLOVE ECLIPSE 6.5 STRL STRAW (GLOVE) ×1 IMPLANT
GLOVE ECLIPSE 7.0 STRL STRAW (GLOVE) IMPLANT
GLOVE ECLIPSE 8.0 STRL XLNG CF (GLOVE) IMPLANT
GLOVE ECLIPSE 8.5 STRL (GLOVE) IMPLANT
GLOVE EXAM NITRILE LRG STRL (GLOVE) IMPLANT
GLOVE EXAM NITRILE MD LF STRL (GLOVE) IMPLANT
GLOVE EXAM NITRILE XL STR (GLOVE) IMPLANT
GLOVE EXAM NITRILE XS STR PU (GLOVE) IMPLANT
GLOVE INDICATOR 6.5 STRL GRN (GLOVE) IMPLANT
GLOVE INDICATOR 7.0 STRL GRN (GLOVE) IMPLANT
GLOVE INDICATOR 7.5 STRL GRN (GLOVE) IMPLANT
GLOVE INDICATOR 8.0 STRL GRN (GLOVE) IMPLANT
GLOVE INDICATOR 8.5 STRL (GLOVE) IMPLANT
GLOVE OPTIFIT SS 8.0 STRL (GLOVE) IMPLANT
GLOVE SURG SS PI 6.5 STRL IVOR (GLOVE) ×1 IMPLANT
GLOVE SURG SS PI 7.5 STRL IVOR (GLOVE) ×3 IMPLANT
GOWN STRL REUS W/ TWL LRG LVL3 (GOWN DISPOSABLE) ×2 IMPLANT
GOWN STRL REUS W/ TWL XL LVL3 (GOWN DISPOSABLE) IMPLANT
GOWN STRL REUS W/TWL 2XL LVL3 (GOWN DISPOSABLE) ×1 IMPLANT
GOWN STRL REUS W/TWL LRG LVL3 (GOWN DISPOSABLE) ×2
GOWN STRL REUS W/TWL XL LVL3 (GOWN DISPOSABLE)
KIT ACCESSORY (KITS) ×1 IMPLANT
KIT BASIN OR (CUSTOM PROCEDURE TRAY) ×2 IMPLANT
KIT ROOM TURNOVER OR (KITS) ×2 IMPLANT
LIQUID BAND (GAUZE/BANDAGES/DRESSINGS) ×2 IMPLANT
NDL HYPO 25X1 1.5 SAFETY (NEEDLE) ×1 IMPLANT
NDL SPNL 22GX3.5 QUINCKE BK (NEEDLE) IMPLANT
NEEDLE HYPO 25X1 1.5 SAFETY (NEEDLE) ×2 IMPLANT
NEEDLE SPNL 22GX3.5 QUINCKE BK (NEEDLE) ×2 IMPLANT
NEURO LEAD TRAIL 60CM (Lead) ×2 IMPLANT
NS IRRIG 1000ML POUR BTL (IV SOLUTION) ×2 IMPLANT
PACK LAMINECTOMY NEURO (CUSTOM PROCEDURE TRAY) ×2 IMPLANT
PAD ARMBOARD 7.5X6 YLW CONV (MISCELLANEOUS) ×8 IMPLANT
SPONGE SURGIFOAM ABS GEL SZ50 (HEMOSTASIS) ×2 IMPLANT
SUT ETHILON 3 0 FSL (SUTURE) ×1 IMPLANT
SUT SILK 2 0 TIES 10X30 (SUTURE) ×2 IMPLANT
SUT VIC AB 0 CT1 18XCR BRD8 (SUTURE) ×1 IMPLANT
SUT VIC AB 0 CT1 8-18 (SUTURE) ×2
SUT VIC AB 2-0 CT1 18 (SUTURE) ×2 IMPLANT
SUT VIC AB 3-0 SH 8-18 (SUTURE) ×2 IMPLANT
SYR 20ML ECCENTRIC (SYRINGE) ×2 IMPLANT
SYR BULB 3OZ (MISCELLANEOUS) ×2 IMPLANT
SYR CONTROL 10ML LL (SYRINGE) ×2 IMPLANT
SYR EPIDURAL 5ML GLASS (SYRINGE) ×1 IMPLANT
TOWEL OR 17X24 6PK STRL BLUE (TOWEL DISPOSABLE) ×2 IMPLANT
TOWEL OR 17X26 10 PK STRL BLUE (TOWEL DISPOSABLE) ×2 IMPLANT
WATER STERILE IRR 1000ML POUR (IV SOLUTION) ×2 IMPLANT

## 2014-04-23 NOTE — Anesthesia Preprocedure Evaluation (Addendum)
Anesthesia Evaluation  Patient identified by MRN, date of birth, ID band Patient awake    Reviewed: Allergy & Precautions, H&P , NPO status , Patient's Chart, lab work & pertinent test results  Airway Mallampati: II       Dental  (+) Teeth Intact, Dental Advisory Given   Pulmonary shortness of breath and with exertion, asthma , former smoker,  breath sounds clear to auscultation        Cardiovascular hypertension, Pt. on medications Rhythm:Regular Rate:Normal     Neuro/Psych    GI/Hepatic GERD-  Medicated and Controlled,  Endo/Other    Renal/GU      Musculoskeletal   Abdominal   Peds  Hematology   Anesthesia Other Findings   Reproductive/Obstetrics                            Anesthesia Physical Anesthesia Plan  ASA: III  Anesthesia Plan: General   Post-op Pain Management:    Induction: Intravenous  Airway Management Planned: Oral ETT  Additional Equipment:   Intra-op Plan:   Post-operative Plan: Extubation in OR  Informed Consent: I have reviewed the patients History and Physical, chart, labs and discussed the procedure including the risks, benefits and alternatives for the proposed anesthesia with the patient or authorized representative who has indicated his/her understanding and acceptance.   Dental advisory given  Plan Discussed with: CRNA and Anesthesiologist  Anesthesia Plan Comments:         Anesthesia Quick Evaluation

## 2014-04-23 NOTE — Anesthesia Postprocedure Evaluation (Signed)
  Anesthesia Post-op Note  Patient: Sharon Vazquez  Procedure(s) Performed: Procedure(s) with comments: LUMBAR SPINAL CORD STIMULATOR TRIAL (N/A) - Spinal Cord Stimulator Trial  Patient Location: PACU  Anesthesia Type:General  Level of Consciousness: awake, alert  and oriented  Airway and Oxygen Therapy: Patient Spontanous Breathing  Post-op Pain: mild  Post-op Assessment: Post-op Vital signs reviewed, Patient's Cardiovascular Status Stable, Respiratory Function Stable, Patent Airway and Pain level controlled  Post-op Vital Signs: stable  Last Vitals:  Filed Vitals:   04/23/14 1650  BP: 134/79  Pulse: 75  Temp: 36.8 C  Resp: 16    Complications: No apparent anesthesia complications

## 2014-04-23 NOTE — Transfer of Care (Signed)
Immediate Anesthesia Transfer of Care Note  Patient: Sharon Vazquez  Procedure(s) Performed: Procedure(s) with comments: LUMBAR SPINAL CORD STIMULATOR TRIAL (N/A) - Spinal Cord Stimulator Trial  Patient Location: PACU  Anesthesia Type:General  Level of Consciousness: awake, alert , oriented and patient cooperative  Airway & Oxygen Therapy: Patient Spontanous Breathing  Post-op Assessment: Report given to PACU RN, Post -op Vital signs reviewed and stable and Patient moving all extremities  Post vital signs: Reviewed and stable  Complications: No apparent anesthesia complications

## 2014-04-23 NOTE — H&P (Signed)
Sharon Vazquez is an 33 y.o. female.   Chief Complaint: left lower extremity pain HPI: Sharon Vazquez is a 33 y.o. female Well known to me. Status post two lumbar procedures for disc, then recurrence. I was not impressed with the amount of disc i found . She has persistent pain and at this time without pathology not being seen to cause the pain we will try a scs.   Past Medical History  Diagnosis Date  . Hypertension   . Anxiety   . Bipolar 1 disorder   . ADHD (attention deficit hyperactivity disorder)   . Borderline personality disorder   . Panic attack   . Headache(784.0)   . Panic disorder   . HSV-2 (herpes simplex virus 2) infection   . Hx of chlamydia infection   . IBS (irritable bowel syndrome)   . Abnormal Pap smear   . Constipation     takes Dulcolax daily as needed takes Amitiza daily  . Anxiety     takes Xanax daily as needed  . Back spasm     takes Flexeril daily as needed  . Insomnia     takes Ambien nightly  . Hypertension     takes INderal and Clonidine daily  . Shortness of breath     with exertion  . Asthma     Ventolin as needed and QVAR takes daily  . History of bronchitis     last time 4-69yrs ago  . Headache(784.0)     migraines-last one about 72months ago;takes Topamax daily  . Weakness     numbness and tingling to left foot  . Joint swelling     right knee  . Chronic back pain     HNP  . Eczema     has 2 creams uses as needed  . GERD (gastroesophageal reflux disease)     takes Tums as needed  . Internal hemorrhoids   . History of colon polyps   . Urinary urgency   . Mental disorder     takes Abilify as needed  . Bipolar 1 disorder     takes DOxepin daily  . Obesity   . Contraceptive management 08/23/2013  . Irregular periods 04/02/2014    Past Surgical History  Procedure Laterality Date  . Wisdom tooth extraction    . Bunion removal    . Cholecystectomy  10 yrs ago  . Colonoscopy N/A 08/22/2012    Procedure: COLONOSCOPY;   Surgeon: Rogene Houston, MD;  Location: [facility];  Service: Endoscopy;  Laterality: N/A;  100  . Tonsillectomy    . Cholecystectomy  10+yrs ago  . Tonsillectomy      as a child  . Bunionectomy Left     pins in big toe and 2nd toe  . Wisdom teeth extracted    . Epidural injections      x 2  . Colonoscopy    . Lumbar laminectomy/decompression microdiscectomy Left 07/03/2013    Procedure: LEFT LUMBAR THREE-FOUR microdiskectomy;  Surgeon: Winfield Cunas, MD;  Location: [facility];  Service: Neurosurgery;  Laterality: Left;  LEFT LUMBAR THREE-FOUR microdiskectomy  . Lumbar laminectomy/decompression microdiscectomy Left 11/29/2013    Procedure: LEFT Lumbar Four-Five Redo microdiskectomy;  Surgeon: Winfield Cunas, MD;  Location: [facility];  Service: Neurosurgery;  Laterality: Left;  LEFT Lumbar Four-Five Redo microdiskectomy  . Back surgery      Family History  Problem Relation Age of Onset  . Hypertension Mother   .  Diabetes Mother   . Thyroid disease Mother   . Other Mother     PTSD  . Hypertension Maternal Aunt   . Diabetes Maternal Aunt   . Thyroid disease Maternal Aunt   . Diabetes Maternal Grandmother   . Heart disease Maternal Grandmother     CHF  . Hypertension Maternal Grandmother   . Diabetes Father   . Hypertension Father   . Obesity Father   . Cancer Paternal Grandmother     breast  . Crohn's disease Maternal Uncle   . Hyperlipidemia Maternal Uncle   . Diabetes Cousin    Social History:  reports that she quit smoking about 2 years ago. Her smoking use included Cigarettes. She has a 9 pack-year smoking history. She has never used smokeless tobacco. She reports that she drinks alcohol. She reports that she does not use illicit drugs.  Allergies:  Allergies  Allergen Reactions  . Bee Venom Anaphylaxis  . Dilaudid [Hydromorphone Hcl] Anaphylaxis and Hives  . Dilaudid [Hydromorphone Hcl] Anaphylaxis and Hives  . Latex Hives  . Senna Anaphylaxis and Hives  .  Adhesive [Tape] Hives and Other (See Comments)    Pulls skin off (use paper tape)  . Hydromorphone     Other reaction(s): Unknown    Medications Prior to Admission  Medication Sig Dispense Refill  . albuterol (PROVENTIL HFA;VENTOLIN HFA) 108 (90 BASE) MCG/ACT inhaler Inhale 2 puffs into the lungs every 4 (four) hours as needed for wheezing or shortness of breath.     . alprazolam (XANAX) 2 MG tablet Take 2 mg by mouth 3 (three) times daily as needed for anxiety (panic attacks).    Marland Kitchen ALUVEA 39 % CREA Apply 1 application topically 2 (two) times daily as needed. Dry feet.    . ARIPiprazole (ABILIFY) 10 MG tablet Take 10 mg by mouth daily.    . beclomethasone (QVAR) 80 MCG/ACT inhaler Inhale 2 puffs into the lungs daily.     . cloNIDine (CATAPRES) 0.1 MG tablet Take 0.1 mg by mouth 2 (two) times daily.    Marland Kitchen doxepin (SINEQUAN) 50 MG capsule Take 100 mg by mouth 2 (two) times daily.     Marland Kitchen esomeprazole (NEXIUM) 40 MG capsule Take 1 capsule (40 mg total) by mouth daily at 12 noon. 30 capsule 5  . fexofenadine (ALLEGRA) 180 MG tablet Take 180 mg by mouth daily.    Marland Kitchen lubiprostone (AMITIZA) 24 MCG capsule Take 1 capsule (24 mcg total) by mouth 2 (two) times daily with a meal. 60 capsule 5  . medroxyPROGESTERone (DEPO-PROVERA) 150 MG/ML injection Inject 1 mL (150 mg total) into the muscle every 3 (three) months. 1 mL 4  . methocarbamol (ROBAXIN) 500 MG tablet Take 1 tablet (500 mg total) by mouth 3 (three) times daily. 30 tablet 0  . Olopatadine HCl (PATADAY) 0.2 % SOLN Place 1 drop into both eyes daily as needed (itchy eyes).    Marland Kitchen oxyCODONE-acetaminophen (ROXICET) 5-325 MG per tablet Take 1 tablet by mouth every 6 (six) hours as needed for severe pain. 80 tablet 0  . pimecrolimus (ELIDEL) 1 % cream Apply 1 application topically 2 (two) times daily as needed (facial rash).    . promethazine (PHENERGAN) 25 MG tablet Take 25 mg by mouth every 6 (six) hours as needed for nausea or vomiting.    . propranolol  (INDERAL) 10 MG tablet Take 10 mg by mouth 3 (three) times daily.    Marland Kitchen topiramate (TOPAMAX) 25 MG tablet Take 50  mg by mouth 2 (two) times daily.     Marland Kitchen zolpidem (AMBIEN CR) 12.5 MG CR tablet Take 12.5 mg by mouth at bedtime.     . Diclofenac Potassium (CAMBIA) 50 MG PACK Take 50 mg by mouth daily as needed (migraines).    . EPINEPHrine (EPIPEN) 0.3 mg/0.3 mL SOAJ injection Inject 0.3 mg into the muscle as needed (allergic reaction).    . methocarbamol (ROBAXIN) 500 MG tablet Take 1 tablet (500 mg total) by mouth 3 (three) times daily between meals as needed. (Patient not taking: Reported on 04/15/2014) 20 tablet 0  . Mometasone Furoate (NASONEX NA) Place 1 spray into both nostrils daily. by Nasal route.    . nystatin (MYCOSTATIN/NYSTOP) 100000 UNIT/GM POWD Apply topically See admin instructions. Apply two-three times a day to affected area as needed      No results found for this or any previous visit (from the past 48 hour(s)). No results found.  Review of Systems  HENT: Negative.   Eyes: Negative.   Respiratory: Negative.   Cardiovascular: Negative.   Gastrointestinal: Negative.   Genitourinary: Negative.   Musculoskeletal: Positive for back pain.  Skin: Negative.   Neurological: Positive for focal weakness and weakness.  Endo/Heme/Allergies: Negative.   Psychiatric/Behavioral: Positive for depression.    Blood pressure 103/64, pulse 86, temperature 98.7 F (37.1 C), temperature source Oral, resp. rate 20, height 5\' 7"  (1.702 m), weight 96.616 kg (213 lb), last menstrual period 03/06/2014, SpO2 100 %. Physical Exam  Constitutional: She is oriented to person, place, and time. She appears well-developed and well-nourished. She appears distressed.  HENT:  Head: Normocephalic and atraumatic.  Eyes: Conjunctivae and EOM are normal. Pupils are equal, round, and reactive to light.  Neck: Normal range of motion. Neck supple.  Cardiovascular: Normal rate and regular rhythm.    Respiratory: Effort normal and breath sounds normal.  GI: Soft. Bowel sounds are normal.  Musculoskeletal: Normal range of motion.  Neurological: She is alert and oriented to person, place, and time. She has normal reflexes. She displays normal reflexes. No cranial nerve deficit. She exhibits normal muscle tone. Coordination normal.  Weakness in left lower extremity  Skin: Skin is warm and dry.     Assessment/Plan Or for scs trial.   Dolton Shaker L 04/23/2014, 1:04 PM

## 2014-04-23 NOTE — Op Note (Signed)
04/23/2014  3:09 PM  PATIENT:  Sharon Vazquez  33 y.o. female  PRE-OPERATIVE DIAGNOSIS:  back pain with left sided radiculopathy  POST-OPERATIVE DIAGNOSIS:  back pain with left sided radiculopathy  PROCEDURE:  Procedure(s): percutaneous placement  SPINAL CORD STIMULATOR TRIAL  SURGEON:Faraaz Wolin L   ASSISTANTS:none  ANESTHESIA:   local and general  EBL:  Total I/O In: 650 [I.V.:650] Out: -   BLOOD ADMINISTERED:none  CELL SAVER GIVEN:none  COUNT:per nursing  DRAINS: spinal cord stimulator leads x2 medtronic   SPECIMEN:  No Specimen  DICTATION: [name] was brought to the operating room, intubated, and placed under a general anesthetic without difficulty. She was positioned prone on a wilson frame with all pressure points properly padded. Her back was prepped and draped in a sterile manner. With fluoroscopic guidance I placed ,via spinal needles, two flexible leads to the level of T7, one on the right, the other on the left. The leads were secured to the skin with anchors which enclosed the leads, and were sutured to the skin. Sterile dressings were placed.  PLAN OF CARE: Discharge to home after PACU  PATIENT DISPOSITION:  PACU - hemodynamically stable.   Delay start of Pharmacological VTE agent (>24hrs) due to surgical blood loss or risk of bleeding:  yes

## 2014-04-23 NOTE — Progress Notes (Signed)
Pt and mother given D/C instructions with verbal understanding. Pt has voided, ambulated, and pain is controlled. Pt and family was given instructions on Spinal stimulator prior to D/C. Pt D/C'd home via walking @ 1815 per MD order. Pt is stable @ D/C and has no other needs at this time. Holli Humbles, RN

## 2014-04-23 NOTE — Anesthesia Procedure Notes (Signed)
Procedure Name: Intubation Date/Time: 04/23/2014 1:24 PM Performed by: Rush Farmer E Pre-anesthesia Checklist: Patient identified, Emergency Drugs available, Suction available, Patient being monitored and Timeout performed Patient Re-evaluated:Patient Re-evaluated prior to inductionOxygen Delivery Method: Circle system utilized Preoxygenation: Pre-oxygenation with 100% oxygen Intubation Type: IV induction Ventilation: Mask ventilation without difficulty Laryngoscope Size: Mac and 3 Grade View: Grade I Tube type: Oral Tube size: 7.0 mm Number of attempts: 1 Airway Equipment and Method: Stylet Placement Confirmation: ETT inserted through vocal cords under direct vision,  positive ETCO2 and breath sounds checked- equal and bilateral Secured at: 21 cm Tube secured with: Tape Dental Injury: Teeth and Oropharynx as per pre-operative assessment

## 2014-04-29 ENCOUNTER — Encounter (HOSPITAL_COMMUNITY): Payer: Self-pay | Admitting: Neurosurgery

## 2014-05-07 ENCOUNTER — Emergency Department (HOSPITAL_COMMUNITY)
Admission: EM | Admit: 2014-05-07 | Discharge: 2014-05-07 | Disposition: A | Discharge status: Home / Self Care | Payer: BC Managed Care – PPO | Attending: Emergency Medicine | Admitting: Emergency Medicine

## 2014-05-07 ENCOUNTER — Encounter (HOSPITAL_COMMUNITY): Payer: Self-pay | Admitting: *Deleted

## 2014-05-07 ENCOUNTER — Telehealth (INDEPENDENT_AMBULATORY_CARE_PROVIDER_SITE_OTHER): Payer: Self-pay | Admitting: Internal Medicine

## 2014-05-07 DIAGNOSIS — K648 Other hemorrhoids: Secondary | ICD-10-CM | POA: Diagnosis not present

## 2014-05-07 DIAGNOSIS — K219 Gastro-esophageal reflux disease without esophagitis: Secondary | ICD-10-CM | POA: Insufficient documentation

## 2014-05-07 DIAGNOSIS — K589 Irritable bowel syndrome without diarrhea: Secondary | ICD-10-CM | POA: Diagnosis not present

## 2014-05-07 DIAGNOSIS — I10 Essential (primary) hypertension: Secondary | ICD-10-CM | POA: Diagnosis not present

## 2014-05-07 DIAGNOSIS — M62830 Muscle spasm of back: Secondary | ICD-10-CM | POA: Insufficient documentation

## 2014-05-07 DIAGNOSIS — F909 Attention-deficit hyperactivity disorder, unspecified type: Secondary | ICD-10-CM | POA: Diagnosis not present

## 2014-05-07 DIAGNOSIS — G8929 Other chronic pain: Secondary | ICD-10-CM | POA: Diagnosis not present

## 2014-05-07 DIAGNOSIS — Z8619 Personal history of other infectious and parasitic diseases: Secondary | ICD-10-CM | POA: Diagnosis not present

## 2014-05-07 DIAGNOSIS — Z8742 Personal history of other diseases of the female genital tract: Secondary | ICD-10-CM | POA: Insufficient documentation

## 2014-05-07 DIAGNOSIS — Z87891 Personal history of nicotine dependence: Secondary | ICD-10-CM | POA: Insufficient documentation

## 2014-05-07 DIAGNOSIS — K649 Unspecified hemorrhoids: Secondary | ICD-10-CM

## 2014-05-07 DIAGNOSIS — Z91040 Latex allergy status: Secondary | ICD-10-CM | POA: Diagnosis not present

## 2014-05-07 DIAGNOSIS — Z79899 Other long term (current) drug therapy: Secondary | ICD-10-CM | POA: Diagnosis not present

## 2014-05-07 DIAGNOSIS — F319 Bipolar disorder, unspecified: Secondary | ICD-10-CM | POA: Diagnosis not present

## 2014-05-07 DIAGNOSIS — F410 Panic disorder [episodic paroxysmal anxiety] without agoraphobia: Secondary | ICD-10-CM | POA: Diagnosis not present

## 2014-05-07 DIAGNOSIS — Z3202 Encounter for pregnancy test, result negative: Secondary | ICD-10-CM | POA: Diagnosis not present

## 2014-05-07 DIAGNOSIS — Z7952 Long term (current) use of systemic steroids: Secondary | ICD-10-CM | POA: Diagnosis not present

## 2014-05-07 DIAGNOSIS — Z872 Personal history of diseases of the skin and subcutaneous tissue: Secondary | ICD-10-CM | POA: Insufficient documentation

## 2014-05-07 DIAGNOSIS — E669 Obesity, unspecified: Secondary | ICD-10-CM | POA: Insufficient documentation

## 2014-05-07 DIAGNOSIS — R197 Diarrhea, unspecified: Secondary | ICD-10-CM

## 2014-05-07 DIAGNOSIS — Z86010 Personal history of colonic polyps: Secondary | ICD-10-CM | POA: Diagnosis not present

## 2014-05-07 DIAGNOSIS — K625 Hemorrhage of anus and rectum: Secondary | ICD-10-CM | POA: Diagnosis present

## 2014-05-07 DIAGNOSIS — Z7951 Long term (current) use of inhaled steroids: Secondary | ICD-10-CM | POA: Diagnosis not present

## 2014-05-07 DIAGNOSIS — J45909 Unspecified asthma, uncomplicated: Secondary | ICD-10-CM | POA: Diagnosis not present

## 2014-05-07 LAB — URINALYSIS, ROUTINE W REFLEX MICROSCOPIC
Bilirubin Urine: NEGATIVE
Glucose, UA: NEGATIVE mg/dL
Hgb urine dipstick: NEGATIVE
Ketones, ur: NEGATIVE mg/dL
LEUKOCYTES UA: NEGATIVE
NITRITE: NEGATIVE
PH: 7.5 (ref 5.0–8.0)
PROTEIN: NEGATIVE mg/dL
Specific Gravity, Urine: 1.015 (ref 1.005–1.030)
Urobilinogen, UA: 0.2 mg/dL (ref 0.0–1.0)

## 2014-05-07 LAB — COMPREHENSIVE METABOLIC PANEL
ALK PHOS: 64 U/L (ref 39–117)
ALT: 18 U/L (ref 0–35)
AST: 17 U/L (ref 0–37)
Albumin: 3.6 g/dL (ref 3.5–5.2)
Anion gap: 13 (ref 5–15)
BUN: 6 mg/dL (ref 6–23)
CALCIUM: 9 mg/dL (ref 8.4–10.5)
CO2: 21 mEq/L (ref 19–32)
Chloride: 105 mEq/L (ref 96–112)
Creatinine, Ser: 0.83 mg/dL (ref 0.50–1.10)
GFR calc non Af Amer: >90 mL/min (ref >90)
GLUCOSE: 121 mg/dL — AB (ref 70–99)
Potassium: 3.6 mEq/L — ABNORMAL LOW (ref 3.7–5.3)
SODIUM: 139 meq/L (ref 137–147)
TOTAL PROTEIN: 6.9 g/dL (ref 6.0–8.3)
Total Bilirubin: 0.2 mg/dL — ABNORMAL LOW (ref 0.3–1.2)

## 2014-05-07 LAB — CBC WITH DIFFERENTIAL/PLATELET
BASOS PCT: 0 % (ref 0–1)
Basophils Absolute: 0 10*3/uL (ref 0.0–0.1)
EOS ABS: 0.2 10*3/uL (ref 0.0–0.7)
EOS PCT: 3 % (ref 0–5)
HCT: 37.6 % (ref 36.0–46.0)
Hemoglobin: 13.5 g/dL (ref 12.0–15.0)
LYMPHS ABS: 3.1 10*3/uL (ref 0.7–4.0)
Lymphocytes Relative: 34 % (ref 12–46)
MCH: 31.8 pg (ref 26.0–34.0)
MCHC: 35.9 g/dL (ref 30.0–36.0)
MCV: 88.5 fL (ref 78.0–100.0)
Monocytes Absolute: 0.5 10*3/uL (ref 0.1–1.0)
Monocytes Relative: 5 % (ref 3–12)
NEUTROS PCT: 58 % (ref 43–77)
Neutro Abs: 5.4 10*3/uL (ref 1.7–7.7)
PLATELETS: 318 10*3/uL (ref 150–400)
RBC: 4.25 MIL/uL (ref 3.87–5.11)
RDW: 13.2 % (ref 11.5–15.5)
WBC: 9.3 10*3/uL (ref 4.0–10.5)

## 2014-05-07 LAB — PREGNANCY, URINE: Preg Test, Ur: NEGATIVE

## 2014-05-07 MED ORDER — PRAMOXINE HCL 1 % RE FOAM
Freq: Three times a day (TID) | RECTAL | Status: DC | PRN
  Administered 2014-05-07: 1 via RECTAL
  Filled 2014-05-07: qty 15

## 2014-05-07 NOTE — ED Notes (Signed)
Rectal foam applied with Charmayne Sheer in room.

## 2014-05-07 NOTE — ED Notes (Signed)
Patient given Proctofoam to take her, usage explained.

## 2014-05-07 NOTE — Discharge Instructions (Signed)
Use your imodium as needed for your diarrhea. Follow up with Terri in the office, return here as needed.

## 2014-05-07 NOTE — ED Provider Notes (Signed)
CSN: [id number]     Arrival date & time 05/07/14  2774 History   First MD Initiated Contact with Patient 05/07/14 1037     Chief Complaint  Patient presents with  . Rectal Bleeding     (Consider location/radiation/quality/duration/timing/severity/associated sxs/prior Treatment) Patient is a 33 y.o. female presenting with hematochezia. The history is provided by the patient.  Rectal Bleeding Quality:  Bright red Duration:  1 week Timing:  Intermittent Chronicity:  New Context: diarrhea   Similar prior episodes: no   Relieved by:  None tried Worsened by:  Defecation Associated symptoms: abdominal pain (cramping with diarrhea)   Risk factors: hx of IBD    Sharon Vazquez is a 33 y.o. female who presents to the ED with bright red rectal bleeding that started last week. She states the only time she sees it is when she has a BM. She reports having diarrhea x 2 days that starts out solid and then gets watery. She is going 2 times a day and that is the only time there is blood. She has IBS and states she usually only has a BM once every 2 weeks even though she takes laxatives. She call her GI doctor this am and they told her to come to the ED.   Past Medical History  Diagnosis Date  . Hypertension   . Anxiety   . Bipolar 1 disorder   . ADHD (attention deficit hyperactivity disorder)   . Borderline personality disorder   . Panic attack   . Headache(784.0)   . Panic disorder   . HSV-2 (herpes simplex virus 2) infection   . Hx of chlamydia infection   . IBS (irritable bowel syndrome)   . Abnormal Pap smear   . Constipation     takes Dulcolax daily as needed takes Amitiza daily  . Anxiety     takes Xanax daily as needed  . Back spasm     takes Flexeril daily as needed  . Insomnia     takes Ambien nightly  . Hypertension     takes INderal and Clonidine daily  . Shortness of breath     with exertion  . Asthma     Ventolin as needed and QVAR takes daily  . History of  bronchitis     last time 4-14yrs ago  . Headache(784.0)     migraines-last one about 42months ago;takes Topamax daily  . Weakness     numbness and tingling to left foot  . Joint swelling     right knee  . Chronic back pain     HNP  . Eczema     has 2 creams uses as needed  . GERD (gastroesophageal reflux disease)     takes Tums as needed  . Internal hemorrhoids   . History of colon polyps   . Urinary urgency   . Mental disorder     takes Abilify as needed  . Bipolar 1 disorder     takes DOxepin daily  . Obesity   . Contraceptive management 08/23/2013  . Irregular periods 04/02/2014   Past Surgical History  Procedure Laterality Date  . Wisdom tooth extraction    . Bunion removal    . Cholecystectomy  10 yrs ago  . Colonoscopy N/A 08/22/2012    Procedure: COLONOSCOPY;  Surgeon: Rogene Houston, MD;  [facility];  Service: Endoscopy;  Laterality: N/A;  100  . Tonsillectomy    . Cholecystectomy  10+yrs ago  . Tonsillectomy  as a child  . Bunionectomy Left     pins in big toe and 2nd toe  . Wisdom teeth extracted    . Epidural injections      x 2  . Colonoscopy    . Lumbar laminectomy/decompression microdiscectomy Left 07/03/2013    Procedure: LEFT LUMBAR THREE-FOUR microdiskectomy;  Surgeon: Winfield Cunas, MD;  Location: [place] [facility];  Service: Neurosurgery;  Laterality: Left;  LEFT LUMBAR THREE-FOUR microdiskectomy  . Lumbar laminectomy/decompression microdiscectomy Left 11/29/2013    Procedure: LEFT Lumbar Four-Five Redo microdiskectomy;  Surgeon: Winfield Cunas, MD;  Location: [facility];  Service: Neurosurgery;  Laterality: Left;  LEFT Lumbar Four-Five Redo microdiskectomy  . Back surgery    . Spinal cord stimulator trial N/A 04/23/2014    Procedure: LUMBAR SPINAL CORD STIMULATOR TRIAL;  Surgeon: Ashok Pall, MD;  Location: [place] [facility];  Service: Neurosurgery;  Laterality: N/A;  Spinal Cord Stimulator Trial   Family History  Problem Relation Age of  Onset  . Hypertension Mother   . Diabetes Mother   . Thyroid disease Mother   . Other Mother     PTSD  . Hypertension Maternal Aunt   . Diabetes Maternal Aunt   . Thyroid disease Maternal Aunt   . Diabetes Maternal Grandmother   . Heart disease Maternal Grandmother     CHF  . Hypertension Maternal Grandmother   . Diabetes Father   . Hypertension Father   . Obesity Father   . Cancer Paternal Grandmother     breast  . Crohn's disease Maternal Uncle   . Hyperlipidemia Maternal Uncle   . Diabetes Cousin    History  Substance Use Topics  . Smoking status: Former Smoker -- 1.00 packs/day for 9 years    Types: Cigarettes    Quit date: 06/07/2011  . Smokeless tobacco: Never Used     Comment: quit 07/06/12  . Alcohol Use: Yes     Comment: rarely   OB History    Gravida Para Term Preterm AB TAB SAB Ectopic Multiple Living   0              Review of Systems  Gastrointestinal: Positive for abdominal pain (cramping with diarrhea), hematochezia, anal bleeding and rectal pain.  All other systems negative    Allergies  Bee venom; Dilaudid; Dilaudid; Latex; Senna; Adhesive; and Hydromorphone  Home Medications   Prior to Admission medications   Medication Sig Start Date End Date Taking? Authorizing Provider  albuterol (PROVENTIL HFA;VENTOLIN HFA) 108 (90 BASE) MCG/ACT inhaler Inhale 2 puffs into the lungs every 4 (four) hours as needed for wheezing or shortness of breath.    Yes Historical Provider, MD  alprazolam Duanne Moron) 2 MG tablet Take 2 mg by mouth 3 (three) times daily as needed for anxiety (panic attacks).   Yes Historical Provider, MD  ALUVEA 39 % CREA Apply 1 application topically 2 (two) times daily as needed. Dry feet. 03/03/14  Yes Historical Provider, MD  ARIPiprazole (ABILIFY) 10 MG tablet Take 10 mg by mouth daily.   Yes Historical Provider, MD  beclomethasone (QVAR) 80 MCG/ACT inhaler Inhale 2 puffs into the lungs daily.    Yes Historical Provider, MD  cloNIDine  (CATAPRES) 0.1 MG tablet Take 0.1 mg by mouth 2 (two) times daily.   Yes Historical Provider, MD  Diclofenac Potassium (CAMBIA) 50 MG PACK Take 50 mg by mouth daily as needed (migraines).   Yes Historical Provider, MD  doxepin (SINEQUAN) 50 MG capsule Take  100 mg by mouth 2 (two) times daily.    Yes Historical Provider, MD  EPINEPHrine (EPIPEN) 0.3 mg/0.3 mL SOAJ injection Inject 0.3 mg into the muscle as needed (allergic reaction).   Yes Historical Provider, MD  esomeprazole (NEXIUM) 40 MG capsule Take 1 capsule (40 mg total) by mouth daily at 12 noon. 03/04/14  Yes Rogene Houston, MD  fexofenadine (ALLEGRA) 180 MG tablet Take 180 mg by mouth daily.   Yes Historical Provider, MD  lubiprostone (AMITIZA) 24 MCG capsule Take 1 capsule (24 mcg total) by mouth 2 (two) times daily with a meal. 03/04/14  Yes Rogene Houston, MD  medroxyPROGESTERone (DEPO-PROVERA) 150 MG/ML injection Inject 1 mL (150 mg total) into the muscle every 3 (three) months. 04/02/14  Yes Estill Dooms, NP  methocarbamol (ROBAXIN) 500 MG tablet Take 1 tablet (500 mg total) by mouth 3 (three) times daily. 04/03/14  Yes Tammy L. Triplett, PA-C  Mometasone Furoate (NASONEX NA) Place 1 spray into both nostrils daily. by Nasal route.   Yes Historical Provider, MD  nystatin (MYCOSTATIN/NYSTOP) 100000 UNIT/GM POWD Apply topically See admin instructions. Apply two-three times a day to affected area as needed   Yes Historical Provider, MD  Olopatadine HCl (PATADAY) 0.2 % SOLN Place 1 drop into both eyes daily as needed (itchy eyes).   Yes Historical Provider, MD  oxyCODONE-acetaminophen (ROXICET) 5-325 MG per tablet Take 1 tablet by mouth every 6 (six) hours as needed for severe pain. 11/30/13  Yes Ashok Pall, MD  pimecrolimus (ELIDEL) 1 % cream Apply 1 application topically 2 (two) times daily as needed (facial rash).   Yes Historical Provider, MD  promethazine (PHENERGAN) 25 MG tablet Take 25 mg by mouth every 6 (six) hours as needed  for nausea or vomiting.   Yes Historical Provider, MD  propranolol (INDERAL) 10 MG tablet Take 10 mg by mouth 3 (three) times daily.   Yes Historical Provider, MD  topiramate (TOPAMAX) 25 MG tablet Take 50 mg by mouth 2 (two) times daily.    Yes Historical Provider, MD  zolpidem (AMBIEN CR) 12.5 MG CR tablet Take 12.5 mg by mouth at bedtime.    Yes Historical Provider, MD  methocarbamol (ROBAXIN) 500 MG tablet Take 1 tablet (500 mg total) by mouth 3 (three) times daily between meals as needed. Patient not taking: Reported on 04/15/2014 03/23/14   Tanna Furry, MD   BP 117/84 mmHg  Pulse 106  Temp(Src) 98.6 F (37 C) (Oral)  Resp 22  Ht 5\' 7"  (1.702 m)  Wt 205 lb (92.987 kg)  BMI 32.10 kg/m2  SpO2 98%  LMP 03/06/2014 Physical Exam  Constitutional: She is oriented to person, place, and time. She appears well-developed and well-nourished.  HENT:  Head: Normocephalic.  Eyes: EOM are normal.  Neck: Neck supple.  Cardiovascular: Normal rate.   Pulmonary/Chest: Effort normal.  Abdominal: Soft. Bowel sounds are normal. There is no rebound and no guarding.  Minimal tenderness with deep palpation lower abdomen, left.   Genitourinary: Rectal exam shows external hemorrhoid and tenderness.  Irritation noted at anus with tiny tear at 6 o'clock. No stool palpated in rectum.   Musculoskeletal: Normal range of motion.  Neurological: She is alert and oriented to person, place, and time. No cranial nerve deficit.  Skin: Skin is warm and dry.  Psychiatric: She has a normal mood and affect. Her behavior is normal.  Nursing note and vitals reviewed.   ED Course  Procedures (including critical care time) Labs  Review Results for orders placed or performed during the hospital encounter of 05/07/14 (from the past 24 hour(s))  Urinalysis, Routine w reflex microscopic     Status: None   Collection Time: 05/07/14 10:35 AM  Result Value Ref Range   Color, Urine YELLOW YELLOW   APPearance CLEAR CLEAR    Specific Gravity, Urine 1.015 1.005 - 1.030   pH 7.5 5.0 - 8.0   Glucose, UA NEGATIVE NEGATIVE mg/dL   Hgb urine dipstick NEGATIVE NEGATIVE   Bilirubin Urine NEGATIVE NEGATIVE   Ketones, ur NEGATIVE NEGATIVE mg/dL   Protein, ur NEGATIVE NEGATIVE mg/dL   Urobilinogen, UA 0.2 0.0 - 1.0 mg/dL   Nitrite NEGATIVE NEGATIVE   Leukocytes, UA NEGATIVE NEGATIVE  Pregnancy, urine     Status: None   Collection Time: 05/07/14 10:35 AM  Result Value Ref Range   Preg Test, Ur NEGATIVE NEGATIVE  CBC with Differential     Status: None   Collection Time: 05/07/14 10:48 AM  Result Value Ref Range   WBC 9.3 4.0 - 10.5 K/uL   RBC 4.25 3.87 - 5.11 MIL/uL   Hemoglobin 13.5 12.0 - 15.0 g/dL   HCT 37.6 36.0 - 46.0 %   MCV 88.5 78.0 - 100.0 fL   MCH 31.8 26.0 - 34.0 pg   MCHC 35.9 30.0 - 36.0 g/dL   RDW 13.2 11.5 - 15.5 %   Platelets 318 150 - 400 K/uL   Neutrophils Relative % 58 43 - 77 %   Neutro Abs 5.4 1.7 - 7.7 K/uL   Lymphocytes Relative 34 12 - 46 %   Lymphs Abs 3.1 0.7 - 4.0 K/uL   Monocytes Relative 5 3 - 12 %   Monocytes Absolute 0.5 0.1 - 1.0 K/uL   Eosinophils Relative 3 0 - 5 %   Eosinophils Absolute 0.2 0.0 - 0.7 K/uL   Basophils Relative 0 0 - 1 %   Basophils Absolute 0.0 0.0 - 0.1 K/uL  Comprehensive metabolic panel     Status: Abnormal   Collection Time: 05/07/14 10:48 AM  Result Value Ref Range   Sodium 139 137 - 147 mEq/L   Potassium 3.6 (L) 3.7 - 5.3 mEq/L   Chloride 105 96 - 112 mEq/L   CO2 21 19 - 32 mEq/L   Glucose, Bld 121 (H) 70 - 99 mg/dL   BUN 6 6 - 23 mg/dL   Creatinine, Ser 0.83 0.50 - 1.10 mg/dL   Calcium 9.0 8.4 - 10.5 mg/dL   Total Protein 6.9 6.0 - 8.3 g/dL   Albumin 3.6 3.5 - 5.2 g/dL   AST 17 0 - 37 U/L   ALT 18 0 - 35 U/L   Alkaline Phosphatase 64 39 - 117 U/L   Total Bilirubin 0.2 (L) 0.3 - 1.2 mg/dL   GFR calc non Af Amer >90 >90 mL/min   GFR calc Af Amer >90 >90 mL/min   Anion gap 13 5 - 15    I spoke with Deberah Castle, NP, GI medicine where the  patient goes for her care. Will d/c patient and have her follow up in the office.   Patient given proctofoam prior to d/c for pain and hemorrhoids. She will continue to use as needed and follow up in the office MDM  33 y.o. female with anal pain, diarrhea and blood from rectum x 1 week. Stable for discharge with normal Hgb. Discussed with the patient clinical and lab findings and all questioned fully answered. She will return if  any problems arise.      [place], NP 05/07/14 [place] Knapp, MD 05/07/14 [id number]

## 2014-05-07 NOTE — Telephone Encounter (Signed)
Spoke with patient. Says she was having significant amt of rectal bleeding since Thanksgiving. Advised to go to ED

## 2014-05-07 NOTE — ED Notes (Signed)
Pt states has had bleeding and pain in rectum since last Thursday, pt also co mid abdominal pain and nausea that started today. pt red blood with clots in her stool.

## 2014-05-08 ENCOUNTER — Encounter (HOSPITAL_COMMUNITY): Payer: Self-pay | Admitting: Neurosurgery

## 2014-05-08 ENCOUNTER — Ambulatory Visit (INDEPENDENT_AMBULATORY_CARE_PROVIDER_SITE_OTHER): Payer: BC Managed Care – PPO | Admitting: Internal Medicine

## 2014-05-19 ENCOUNTER — Encounter (INDEPENDENT_AMBULATORY_CARE_PROVIDER_SITE_OTHER): Payer: Self-pay | Admitting: Internal Medicine

## 2014-05-19 ENCOUNTER — Ambulatory Visit (INDEPENDENT_AMBULATORY_CARE_PROVIDER_SITE_OTHER): Payer: BC Managed Care – PPO | Admitting: Internal Medicine

## 2014-05-19 VITALS — BP 112/72 | HR 76 | Temp 97.6°F | Ht 67.0 in | Wt 211.8 lb

## 2014-05-19 DIAGNOSIS — K625 Hemorrhage of anus and rectum: Secondary | ICD-10-CM

## 2014-05-19 MED ORDER — HYDROCORTISONE ACE-PRAMOXINE 1-1 % RE FOAM: 1.0000 | Freq: Two times a day (BID) | RECTAL | Status: DC

## 2014-05-19 NOTE — Progress Notes (Signed)
Subjective:    Patient ID: Sharon Vazquez, female    DOB: 08/09/1980, 33 y.o.   MRN: [id number]  HPI Here today after recent visit to ED for rectal bleeding. Evaluated in the ED and was given Proctofoam which she said helped. She had been straining to have a BM.  Her CBC was normal in the ED. She tells me she has been having diarrhea since Saturday. She has had 4 stools since last night and two stools Saturday. She has seen a small amt of blood. She had stomach cramps/pain. There was no fever associated with her symptoms. She says she does feel better.  She has been taking her Amitiza. She says sometimes the first stool is formed and the second will be loose.  Appetite is not good. She says she sometimes is not hungry. She supplements to Ensure at time. She has lost 4 pounds since September. She has not been on any recent antibiotics. She does admit to straining when she has a BM.    CBC    Component Value Date/Time   WBC 9.3 05/07/2014 1048   RBC 4.25 05/07/2014 1048   HGB 13.5 05/07/2014 1048   HCT 37.6 05/07/2014 1048   PLT 318 05/07/2014 1048   MCV 88.5 05/07/2014 1048   MCH 31.8 05/07/2014 1048   MCHC 35.9 05/07/2014 1048   RDW 13.2 05/07/2014 1048   LYMPHSABS 3.1 05/07/2014 1048   MONOABS 0.5 05/07/2014 1048   EOSABS 0.2 05/07/2014 1048   BASOSABS 0.0 05/07/2014 1048        Review of Systems Past Medical History  Diagnosis Date  . Hypertension   . Anxiety   . Bipolar 1 disorder   . ADHD (attention deficit hyperactivity disorder)   . Borderline personality disorder   . Panic attack   . Headache(784.0)   . Panic disorder   . HSV-2 (herpes simplex virus 2) infection   . Hx of chlamydia infection   . IBS (irritable bowel syndrome)   . Abnormal Pap smear   . Constipation     takes Dulcolax daily as needed takes Amitiza daily  . Anxiety     takes Xanax daily as needed  . Back spasm     takes Flexeril daily as needed  . Insomnia     takes Ambien nightly    . Hypertension     takes INderal and Clonidine daily  . Shortness of breath     with exertion  . Asthma     Ventolin as needed and QVAR takes daily  . History of bronchitis     last time 4-51yrs ago  . Headache(784.0)     migraines-last one about 70months ago;takes Topamax daily  . Weakness     numbness and tingling to left foot  . Joint swelling     right knee  . Chronic back pain     HNP  . Eczema     has 2 creams uses as needed  . GERD (gastroesophageal reflux disease)     takes Tums as needed  . Internal hemorrhoids   . History of colon polyps   . Urinary urgency   . Mental disorder     takes Abilify as needed  . Bipolar 1 disorder     takes DOxepin daily  . Obesity   . Contraceptive management 08/23/2013  . Irregular periods 04/02/2014    Past Surgical History  Procedure Laterality Date  . Wisdom tooth extraction    . Bunion  removal    . Cholecystectomy  10 yrs ago  . Colonoscopy N/A 08/22/2012    Procedure: COLONOSCOPY;  Surgeon: Rogene Houston, MD;  [facility];  Service: Endoscopy;  Laterality: N/A;  100  . Tonsillectomy    . Cholecystectomy  10+yrs ago  . Tonsillectomy      as a child  . Bunionectomy Left     pins in big toe and 2nd toe  . Wisdom teeth extracted    . Epidural injections      x 2  . Colonoscopy    . Lumbar laminectomy/decompression microdiscectomy Left 07/03/2013    Procedure: LEFT LUMBAR THREE-FOUR microdiskectomy;  Surgeon: Winfield Cunas, MD;  Location: [place] [facility];  Service: Neurosurgery;  Laterality: Left;  LEFT LUMBAR THREE-FOUR microdiskectomy  . Lumbar laminectomy/decompression microdiscectomy Left 11/29/2013    Procedure: LEFT Lumbar Four-Five Redo microdiskectomy;  Surgeon: Winfield Cunas, MD;  Location: [facility];  Service: Neurosurgery;  Laterality: Left;  LEFT Lumbar Four-Five Redo microdiskectomy  . Back surgery    . Spinal cord stimulator trial N/A 04/23/2014    Procedure: LUMBAR SPINAL CORD STIMULATOR TRIAL;   Surgeon: Ashok Pall, MD;  Location: [place] [facility];  Service: Neurosurgery;  Laterality: N/A;  Spinal Cord Stimulator Trial    Allergies  Allergen Reactions  . Bee Venom Anaphylaxis  . Dilaudid [Hydromorphone Hcl] Anaphylaxis and Hives  . Dilaudid [Hydromorphone Hcl] Anaphylaxis and Hives  . Latex Hives  . Senna Anaphylaxis and Hives  . Adhesive [Tape] Hives and Other (See Comments)    Pulls skin off (use paper tape)  . Hydromorphone     Other reaction(s): Unknown    Current Outpatient Prescriptions on File Prior to Visit  Medication Sig Dispense Refill  . albuterol (PROVENTIL HFA;VENTOLIN HFA) 108 (90 BASE) MCG/ACT inhaler Inhale 2 puffs into the lungs every 4 (four) hours as needed for wheezing or shortness of breath.     . alprazolam (XANAX) 2 MG tablet Take 2 mg by mouth 3 (three) times daily as needed for anxiety (panic attacks).    Marland Kitchen ALUVEA 39 % CREA Apply 1 application topically 2 (two) times daily as needed. Dry feet.    . beclomethasone (QVAR) 80 MCG/ACT inhaler Inhale 2 puffs into the lungs daily.     . cloNIDine (CATAPRES) 0.1 MG tablet Take 0.1 mg by mouth 2 (two) times daily.    . Diclofenac Potassium (CAMBIA) 50 MG PACK Take 50 mg by mouth daily as needed (migraines).    . doxepin (SINEQUAN) 50 MG capsule Take 100 mg by mouth 2 (two) times daily.     Marland Kitchen EPINEPHrine (EPIPEN) 0.3 mg/0.3 mL SOAJ injection Inject 0.3 mg into the muscle as needed (allergic reaction).    Marland Kitchen esomeprazole (NEXIUM) 40 MG capsule Take 1 capsule (40 mg total) by mouth daily at 12 noon. 30 capsule 5  . fexofenadine (ALLEGRA) 180 MG tablet Take 180 mg by mouth daily.    Marland Kitchen lubiprostone (AMITIZA) 24 MCG capsule Take 1 capsule (24 mcg total) by mouth 2 (two) times daily with a meal. 60 capsule 5  . medroxyPROGESTERone (DEPO-PROVERA) 150 MG/ML injection Inject 1 mL (150 mg total) into the muscle every 3 (three) months. 1 mL 4  . methocarbamol (ROBAXIN) 500 MG tablet Take 1 tablet (500 mg total) by mouth 3  (three) times daily between meals as needed. 20 tablet 0  . Mometasone Furoate (NASONEX NA) Place 1 spray into both nostrils as needed. by Nasal  route.    . nystatin (MYCOSTATIN/NYSTOP) 100000 UNIT/GM POWD Apply topically See admin instructions. Apply two-three times a day to affected area as needed    . Olopatadine HCl (PATADAY) 0.2 % SOLN Place 1 drop into both eyes daily as needed (itchy eyes).    Marland Kitchen oxyCODONE-acetaminophen (ROXICET) 5-325 MG per tablet Take 1 tablet by mouth every 6 (six) hours as needed for severe pain. 80 tablet 0  . pimecrolimus (ELIDEL) 1 % cream Apply 1 application topically 2 (two) times daily as needed (facial rash).    . promethazine (PHENERGAN) 25 MG tablet Take 25 mg by mouth every 6 (six) hours as needed for nausea or vomiting.    . propranolol (INDERAL) 10 MG tablet Take 10 mg by mouth 3 (three) times daily.    Marland Kitchen topiramate (TOPAMAX) 25 MG tablet Take 100 mg by mouth daily. Extended release    . zolpidem (AMBIEN CR) 12.5 MG CR tablet Take 12.5 mg by mouth at bedtime.      No current facility-administered medications on file prior to visit.        Objective:   Physical Exam  Filed Vitals:   05/19/14 0929  Height: 5\' 7"  (1.702 m)  Weight: 211 lb 12.8 oz (96.072 kg)  Alert and oriented. Skin warm and dry. Oral mucosa is moist.   . Sclera anicteric, conjunctivae is pink. Thyroid not enlarged. No cervical lymphadenopathy. Lungs clear. Heart regular rate and rhythm.  Abdomen is soft. Bowel sounds are positive. No hepatomegaly. No abdominal masses felt. Sight tenderness to epigastric region.  No edema to lower extremities.          Assessment & Plan:  IBS, Constipation/Diarrhea. Stop the Amitiza when you have diarrhea. Continue the Ensure. Increase fiber in her diet. OV in March with Dr Janee Morn Rx for Proctofoam.

## 2014-05-19 NOTE — Patient Instructions (Signed)
Proctofoam Rx to her pharmacy. OV in 3 months.

## 2014-05-21 ENCOUNTER — Other Ambulatory Visit (HOSPITAL_COMMUNITY): Payer: Self-pay | Admitting: Neurosurgery

## 2014-06-04 ENCOUNTER — Encounter (HOSPITAL_COMMUNITY)
Admission: RE | Admit: 2014-06-04 | Discharge: 2014-06-04 | Disposition: A | Discharge status: Home / Self Care | Payer: BC Managed Care – PPO | Source: Ambulatory Visit | Attending: Neurosurgery | Admitting: Neurosurgery

## 2014-06-04 ENCOUNTER — Encounter (HOSPITAL_COMMUNITY): Payer: Self-pay

## 2014-06-04 DIAGNOSIS — Z01812 Encounter for preprocedural laboratory examination: Secondary | ICD-10-CM | POA: Insufficient documentation

## 2014-06-04 LAB — COMPREHENSIVE METABOLIC PANEL
ALBUMIN: 4.1 g/dL (ref 3.5–5.2)
ALK PHOS: 61 U/L (ref 39–117)
ALT: 25 U/L (ref 0–35)
AST: 23 U/L (ref 0–37)
Anion gap: 10 (ref 5–15)
BILIRUBIN TOTAL: 0.4 mg/dL (ref 0.3–1.2)
BUN: 5 mg/dL — ABNORMAL LOW (ref 6–23)
CHLORIDE: 112 meq/L (ref 96–112)
CO2: 19 mmol/L (ref 19–32)
Calcium: 9.3 mg/dL (ref 8.4–10.5)
Creatinine, Ser: 0.95 mg/dL (ref 0.50–1.10)
GFR calc Af Amer: >90 mL/min (ref >90)
GFR calc non Af Amer: 78 mL/min — ABNORMAL LOW (ref >90)
Glucose, Bld: 93 mg/dL (ref 70–99)
Potassium: 3.8 mmol/L (ref 3.5–5.1)
SODIUM: 141 mmol/L (ref 135–145)
Total Protein: 7.2 g/dL (ref 6.0–8.3)

## 2014-06-04 LAB — HCG, SERUM, QUALITATIVE: Preg, Serum: NEGATIVE

## 2014-06-04 LAB — CBC
HCT: 39.2 % (ref 36.0–46.0)
Hemoglobin: 13.8 g/dL (ref 12.0–15.0)
MCH: 31 pg (ref 26.0–34.0)
MCHC: 35.2 g/dL (ref 30.0–36.0)
MCV: 88.1 fL (ref 78.0–100.0)
PLATELETS: 351 10*3/uL (ref 150–400)
RBC: 4.45 MIL/uL (ref 3.87–5.11)
RDW: 13.4 % (ref 11.5–15.5)
WBC: 11.7 10*3/uL — ABNORMAL HIGH (ref 4.0–10.5)

## 2014-06-04 LAB — SURGICAL PCR SCREEN
MRSA, PCR: NEGATIVE
STAPHYLOCOCCUS AUREUS: NEGATIVE

## 2014-06-04 NOTE — Progress Notes (Signed)
No primary right now - hers just left No cardiologist ekg feb 2015  [phone] her cell if needed

## 2014-06-04 NOTE — Pre-Procedure Instructions (Signed)
KHYLAH KENDRA  06/04/2014   Your procedure is scheduled on:  Friday, January 8th  Report to [facility] Admitting at 10 AM.  Call this number if you have problems the morning of surgery: [phone]   Remember:   Do not eat food or drink liquids after midnight.   Take these medicines the morning of surgery with A SIP OF WATER: clonidine, nexium, qvar, propranolol, nasonex, eye drops if needed, percocet if needed, xanax if needed, albuterol if needed   Do not wear jewelry, make-up or nail polish.  Do not wear lotions, powders, or perfume,deodorant.  Do not shave 48 hours prior to surgery. Men may shave face and neck.  Do not bring valuables to the hospital.  [facility] is not responsible  for any belongings or valuables.               Contacts, dentures or bridgework may not be worn into surgery.  Leave suitcase in the car. After surgery it may be brought to your room.  For patients admitted to the hospital, discharge time is determined by your  treatment team.               Patients discharged the day of surgery will not be allowed to drive home.  Please read over the following fact sheets that you were given: Pain Booklet, Coughing and Deep Breathing, MRSA Information and Surgical Site Infection Prevention  [place] - Preparing for Surgery  Before surgery, you can play an important role.  Because skin is not sterile, your skin needs to be as free of germs as possible.  You can reduce the number of germs on you skin by washing with CHG (chlorahexidine gluconate) soap before surgery.  CHG is an antiseptic cleaner which kills germs and bonds with the skin to continue killing germs even after washing.  Please DO NOT use if you have an allergy to CHG or antibacterial soaps.  If your skin becomes reddened/irritated stop using the CHG and inform your nurse when you arrive at Short Stay.  Do not shave (including legs and underarms) for at least 48 hours prior to the first  CHG shower.  You may shave your face.  Please follow these instructions carefully:   1.  Shower with CHG Soap the night before surgery and the morning of Surgery.  2.  If you choose to wash your hair, wash your hair first as usual with your normal shampoo.  3.  After you shampoo, rinse your hair and body thoroughly to remove the shampoo.  4.  Use CHG as you would any other liquid soap.  You can apply CHG directly to the skin and wash gently with scrungie or a clean washcloth.  5.  Apply the CHG Soap to your body ONLY FROM THE NECK DOWN.  Do not use on open wounds or open sores.  Avoid contact with your eyes, ears, mouth and genitals (private parts).  Wash genitals (private parts) with your normal soap.  6.  Wash thoroughly, paying special attention to the area where your surgery will be performed.  7.  Thoroughly rinse your body with warm water from the neck down.  8.  DO NOT shower/wash with your normal soap after using and rinsing off the CHG Soap.  9.  Pat yourself dry with a clean towel.            10.  Wear clean pajamas.  11.  Place clean sheets on your bed the night of your first shower and do not sleep with pets.  Day of Surgery  Do not apply any lotions/deoderants the morning of surgery.  Please wear clean clothes to the hospital/surgery center.

## 2014-06-12 MED ORDER — CEFAZOLIN SODIUM-DEXTROSE 2-3 GM-% IV SOLR
2.0000 g | INTRAVENOUS | Status: AC
  Administered 2014-06-13: 2 g via INTRAVENOUS
  Filled 2014-06-12: qty 50

## 2014-06-13 ENCOUNTER — Encounter (HOSPITAL_COMMUNITY): Payer: Self-pay | Admitting: *Deleted

## 2014-06-13 ENCOUNTER — Ambulatory Visit (HOSPITAL_COMMUNITY): Payer: 59

## 2014-06-13 ENCOUNTER — Ambulatory Visit (HOSPITAL_COMMUNITY): Payer: 59 | Admitting: Anesthesiology

## 2014-06-13 ENCOUNTER — Observation Stay (HOSPITAL_COMMUNITY)
Admission: RE | Admit: 2014-06-13 | Discharge: 2014-06-14 | Disposition: A | Discharge status: Home / Self Care | Payer: 59 | Source: Ambulatory Visit | Attending: Neurosurgery | Admitting: Neurosurgery

## 2014-06-13 ENCOUNTER — Encounter (HOSPITAL_COMMUNITY)
Admission: RE | Disposition: A | Discharge status: Home / Self Care | Payer: Self-pay | Source: Ambulatory Visit | Attending: Neurosurgery

## 2014-06-13 DIAGNOSIS — I10 Essential (primary) hypertension: Secondary | ICD-10-CM | POA: Diagnosis not present

## 2014-06-13 DIAGNOSIS — F329 Major depressive disorder, single episode, unspecified: Secondary | ICD-10-CM | POA: Insufficient documentation

## 2014-06-13 DIAGNOSIS — E669 Obesity, unspecified: Secondary | ICD-10-CM | POA: Diagnosis not present

## 2014-06-13 DIAGNOSIS — Z87891 Personal history of nicotine dependence: Secondary | ICD-10-CM | POA: Insufficient documentation

## 2014-06-13 DIAGNOSIS — Z6833 Body mass index (BMI) 33.0-33.9, adult: Secondary | ICD-10-CM | POA: Insufficient documentation

## 2014-06-13 DIAGNOSIS — F419 Anxiety disorder, unspecified: Secondary | ICD-10-CM | POA: Insufficient documentation

## 2014-06-13 DIAGNOSIS — M545 Low back pain, unspecified: Secondary | ICD-10-CM

## 2014-06-13 DIAGNOSIS — M5416 Radiculopathy, lumbar region: Principal | ICD-10-CM | POA: Insufficient documentation

## 2014-06-13 DIAGNOSIS — K219 Gastro-esophageal reflux disease without esophagitis: Secondary | ICD-10-CM | POA: Diagnosis not present

## 2014-06-13 DIAGNOSIS — J45909 Unspecified asthma, uncomplicated: Secondary | ICD-10-CM | POA: Diagnosis not present

## 2014-06-13 DIAGNOSIS — G8929 Other chronic pain: Secondary | ICD-10-CM | POA: Diagnosis present

## 2014-06-13 DIAGNOSIS — M25511 Pain in right shoulder: Secondary | ICD-10-CM | POA: Diagnosis present

## 2014-06-13 HISTORY — PX: SPINAL CORD STIMULATOR INSERTION: SHX5378

## 2014-06-13 SURGERY — INSERTION, SPINAL CORD STIMULATOR, LUMBAR
Anesthesia: General | Site: Spine Lumbar

## 2014-06-13 MED ORDER — MENTHOL 3 MG MT LOZG: 1.0000 | LOZENGE | OROMUCOSAL | Status: DC | PRN

## 2014-06-13 MED ORDER — FENTANYL CITRATE 0.05 MG/ML IJ SOLN
25.0000 ug | INTRAMUSCULAR | Status: DC | PRN
  Administered 2014-06-13: 50 ug via INTRAVENOUS

## 2014-06-13 MED ORDER — FENTANYL CITRATE 0.05 MG/ML IJ SOLN
INTRAMUSCULAR | Status: DC | PRN
  Administered 2014-06-13 (×7): 50 ug via INTRAVENOUS

## 2014-06-13 MED ORDER — SODIUM CHLORIDE 0.9 % IJ SOLN: 3.0000 mL | INTRAMUSCULAR | Status: DC | PRN

## 2014-06-13 MED ORDER — PROPOFOL 10 MG/ML IV BOLUS
INTRAVENOUS | Status: AC
  Filled 2014-06-13: qty 20

## 2014-06-13 MED ORDER — FENTANYL CITRATE 0.05 MG/ML IJ SOLN
INTRAMUSCULAR | Status: AC
  Filled 2014-06-13: qty 5

## 2014-06-13 MED ORDER — UREA 39 % EX CREA: 1.0000 "application " | TOPICAL_CREAM | Freq: Two times a day (BID) | CUTANEOUS | Status: DC | PRN

## 2014-06-13 MED ORDER — TOPIRAMATE ER 100 MG PO CAP24: 100.0000 mg | ORAL_CAPSULE | Freq: Every day | ORAL | Status: DC

## 2014-06-13 MED ORDER — LACTATED RINGERS IV SOLN
INTRAVENOUS | Status: DC
  Administered 2014-06-13: 11:00:00 via INTRAVENOUS

## 2014-06-13 MED ORDER — 0.9 % SODIUM CHLORIDE (POUR BTL) OPTIME
TOPICAL | Status: DC | PRN
  Administered 2014-06-13: 1000 mL

## 2014-06-13 MED ORDER — MIDAZOLAM HCL 2 MG/2ML IJ SOLN
INTRAMUSCULAR | Status: AC
  Filled 2014-06-13: qty 2

## 2014-06-13 MED ORDER — DIAZEPAM 5 MG PO TABS
5.0000 mg | ORAL_TABLET | Freq: Four times a day (QID) | ORAL | Status: DC | PRN
  Administered 2014-06-13: 5 mg via ORAL

## 2014-06-13 MED ORDER — PROPRANOLOL HCL 10 MG PO TABS
10.0000 mg | ORAL_TABLET | Freq: Three times a day (TID) | ORAL | Status: DC
  Administered 2014-06-13: 10 mg via ORAL
  Filled 2014-06-13 (×4): qty 1

## 2014-06-13 MED ORDER — SODIUM CHLORIDE 0.9 % IJ SOLN
3.0000 mL | Freq: Two times a day (BID) | INTRAMUSCULAR | Status: DC
Start: 2014-06-13 — End: 2014-06-14
  Administered 2014-06-13: 3 mL via INTRAVENOUS

## 2014-06-13 MED ORDER — FLUTICASONE PROPIONATE 50 MCG/ACT NA SUSP: 1.0000 | Freq: Every day | NASAL | Status: DC

## 2014-06-13 MED ORDER — LIDOCAINE HCL (CARDIAC) 20 MG/ML IV SOLN
INTRAVENOUS | Status: AC
  Filled 2014-06-13: qty 5

## 2014-06-13 MED ORDER — MOMETASONE FUROATE 200 MCG/ACT IN AERO: 1.0000 | INHALATION_SPRAY | Freq: Every day | RESPIRATORY_TRACT | Status: DC

## 2014-06-13 MED ORDER — FLUTICASONE PROPIONATE HFA 44 MCG/ACT IN AERO
1.0000 | INHALATION_SPRAY | Freq: Two times a day (BID) | RESPIRATORY_TRACT | Status: DC
  Administered 2014-06-13: 1 via RESPIRATORY_TRACT
  Filled 2014-06-13: qty 10.6

## 2014-06-13 MED ORDER — DIAZEPAM 5 MG PO TABS
ORAL_TABLET | ORAL | Status: AC
  Filled 2014-06-13: qty 1

## 2014-06-13 MED ORDER — GLYCOPYRROLATE 0.2 MG/ML IJ SOLN
INTRAMUSCULAR | Status: DC | PRN
  Administered 2014-06-13: .8 mg via INTRAVENOUS

## 2014-06-13 MED ORDER — PIMECROLIMUS 1 % EX CREA: 1.0000 "application " | TOPICAL_CREAM | Freq: Two times a day (BID) | CUTANEOUS | Status: DC | PRN

## 2014-06-13 MED ORDER — LIDOCAINE HCL (CARDIAC) 20 MG/ML IV SOLN
INTRAVENOUS | Status: DC | PRN
  Administered 2014-06-13: 60 mg via INTRAVENOUS

## 2014-06-13 MED ORDER — ROCURONIUM BROMIDE 50 MG/5ML IV SOLN
INTRAVENOUS | Status: AC
  Filled 2014-06-13: qty 1

## 2014-06-13 MED ORDER — SODIUM CHLORIDE 0.9 % IV SOLN: 250.0000 mL | INTRAVENOUS | Status: DC

## 2014-06-13 MED ORDER — POLYETHYLENE GLYCOL 3350 17 G PO PACK
17.0000 g | PACK | Freq: Every day | ORAL | Status: DC | PRN
  Filled 2014-06-13: qty 1

## 2014-06-13 MED ORDER — DICLOFENAC POTASSIUM(MIGRAINE) 50 MG PO PACK: 50.0000 mg | PACK | Freq: Every day | ORAL | Status: DC | PRN

## 2014-06-13 MED ORDER — LACTATED RINGERS IV SOLN
INTRAVENOUS | Status: DC | PRN
  Administered 2014-06-13 (×2): via INTRAVENOUS

## 2014-06-13 MED ORDER — ONDANSETRON HCL 4 MG/2ML IJ SOLN
INTRAMUSCULAR | Status: AC
  Filled 2014-06-13: qty 2

## 2014-06-13 MED ORDER — METHOCARBAMOL 500 MG PO TABS
500.0000 mg | ORAL_TABLET | Freq: Three times a day (TID) | ORAL | Status: DC | PRN
  Administered 2014-06-14: 500 mg via ORAL
  Filled 2014-06-13: qty 1

## 2014-06-13 MED ORDER — HEMOSTATIC AGENTS (NO CHARGE) OPTIME
TOPICAL | Status: DC | PRN
  Administered 2014-06-13: 1 via TOPICAL

## 2014-06-13 MED ORDER — PROMETHAZINE HCL 25 MG/ML IJ SOLN: 6.2500 mg | INTRAMUSCULAR | Status: DC | PRN

## 2014-06-13 MED ORDER — THROMBIN 5000 UNITS EX SOLR
CUTANEOUS | Status: DC | PRN
  Administered 2014-06-13 (×2): 5000 [IU] via TOPICAL

## 2014-06-13 MED ORDER — PANTOPRAZOLE SODIUM 40 MG PO TBEC: 80.0000 mg | DELAYED_RELEASE_TABLET | Freq: Every day | ORAL | Status: DC

## 2014-06-13 MED ORDER — OLOPATADINE HCL 0.1 % OP SOLN
1.0000 [drp] | Freq: Two times a day (BID) | OPHTHALMIC | Status: DC
  Administered 2014-06-13: 1 [drp] via OPHTHALMIC
  Filled 2014-06-13: qty 5

## 2014-06-13 MED ORDER — ALBUTEROL SULFATE (2.5 MG/3ML) 0.083% IN NEBU: 2.5000 mg | INHALATION_SOLUTION | RESPIRATORY_TRACT | Status: DC | PRN

## 2014-06-13 MED ORDER — ROCURONIUM BROMIDE 100 MG/10ML IV SOLN
INTRAVENOUS | Status: DC | PRN
  Administered 2014-06-13: 40 mg via INTRAVENOUS

## 2014-06-13 MED ORDER — LUBIPROSTONE 24 MCG PO CAPS
24.0000 ug | ORAL_CAPSULE | Freq: Two times a day (BID) | ORAL | Status: DC
  Filled 2014-06-13 (×4): qty 1

## 2014-06-13 MED ORDER — OXYCODONE-ACETAMINOPHEN 5-325 MG PO TABS
1.0000 | ORAL_TABLET | ORAL | Status: DC | PRN
  Administered 2014-06-13 – 2014-06-14 (×2): 2 via ORAL
  Filled 2014-06-13 (×2): qty 2

## 2014-06-13 MED ORDER — MEPERIDINE HCL 25 MG/ML IJ SOLN
6.2500 mg | INTRAMUSCULAR | Status: DC | PRN
  Administered 2014-06-13: 12.5 mg via INTRAVENOUS

## 2014-06-13 MED ORDER — NYSTATIN 100000 UNIT/GM EX POWD
1.0000 g | Freq: Two times a day (BID) | CUTANEOUS | Status: DC | PRN
  Filled 2014-06-13: qty 15

## 2014-06-13 MED ORDER — ALCLOMETASONE DIPROPIONATE 0.05 % EX CREA: 1.0000 "application " | TOPICAL_CREAM | Freq: Two times a day (BID) | CUTANEOUS | Status: DC | PRN

## 2014-06-13 MED ORDER — FENTANYL CITRATE 0.05 MG/ML IJ SOLN
INTRAMUSCULAR | Status: AC
  Administered 2014-06-13: 50 ug via INTRAVENOUS
  Filled 2014-06-13: qty 2

## 2014-06-13 MED ORDER — KETOROLAC TROMETHAMINE 30 MG/ML IJ SOLN
30.0000 mg | Freq: Four times a day (QID) | INTRAMUSCULAR | Status: DC
  Administered 2014-06-13 – 2014-06-14 (×2): 30 mg via INTRAVENOUS
  Filled 2014-06-13 (×6): qty 1

## 2014-06-13 MED ORDER — ARTIFICIAL TEARS OP OINT
TOPICAL_OINTMENT | OPHTHALMIC | Status: DC | PRN
  Administered 2014-06-13: 1 via OPHTHALMIC

## 2014-06-13 MED ORDER — CLONIDINE HCL 0.1 MG PO TABS
0.1000 mg | ORAL_TABLET | Freq: Two times a day (BID) | ORAL | Status: DC
  Administered 2014-06-13: 0.1 mg via ORAL
  Filled 2014-06-13 (×3): qty 1

## 2014-06-13 MED ORDER — LORATADINE 10 MG PO TABS
10.0000 mg | ORAL_TABLET | Freq: Every day | ORAL | Status: DC
  Administered 2014-06-13: 10 mg via ORAL
  Filled 2014-06-13 (×2): qty 1

## 2014-06-13 MED ORDER — ZOLPIDEM TARTRATE 5 MG PO TABS: 5.0000 mg | ORAL_TABLET | Freq: Every evening | ORAL | Status: DC | PRN

## 2014-06-13 MED ORDER — PROPOFOL 10 MG/ML IV BOLUS
INTRAVENOUS | Status: DC | PRN
  Administered 2014-06-13: 50 mg via INTRAVENOUS
  Administered 2014-06-13: 30 mg via INTRAVENOUS
  Administered 2014-06-13: 150 mg via INTRAVENOUS

## 2014-06-13 MED ORDER — DOXEPIN HCL 50 MG PO CAPS
50.0000 mg | ORAL_CAPSULE | Freq: Three times a day (TID) | ORAL | Status: DC
  Administered 2014-06-13: 50 mg via ORAL
  Filled 2014-06-13 (×4): qty 1

## 2014-06-13 MED ORDER — PROMETHAZINE HCL 25 MG PO TABS: 25.0000 mg | ORAL_TABLET | Freq: Four times a day (QID) | ORAL | Status: DC | PRN

## 2014-06-13 MED ORDER — ACETAMINOPHEN 650 MG RE SUPP: 650.0000 mg | RECTAL | Status: DC | PRN

## 2014-06-13 MED ORDER — MEPERIDINE HCL 25 MG/ML IJ SOLN
INTRAMUSCULAR | Status: AC
  Filled 2014-06-13: qty 1

## 2014-06-13 MED ORDER — ARIPIPRAZOLE 9.75 MG/1.3ML IM SOLN: 400.0000 mg | INTRAMUSCULAR | Status: DC

## 2014-06-13 MED ORDER — LIDOCAINE-EPINEPHRINE 0.5 %-1:200000 IJ SOLN
INTRAMUSCULAR | Status: DC | PRN
  Administered 2014-06-13: 20 mL
  Administered 2014-06-13: 10 mL

## 2014-06-13 MED ORDER — POTASSIUM CHLORIDE IN NACL 20-0.9 MEQ/L-% IV SOLN
INTRAVENOUS | Status: DC
  Filled 2014-06-13 (×3): qty 1000

## 2014-06-13 MED ORDER — ONDANSETRON HCL 4 MG/2ML IJ SOLN
INTRAMUSCULAR | Status: DC | PRN
  Administered 2014-06-13: 4 mg via INTRAVENOUS

## 2014-06-13 MED ORDER — ONDANSETRON HCL 4 MG/2ML IJ SOLN: 4.0000 mg | INTRAMUSCULAR | Status: DC | PRN

## 2014-06-13 MED ORDER — MIDAZOLAM HCL 5 MG/5ML IJ SOLN
INTRAMUSCULAR | Status: DC | PRN
  Administered 2014-06-13: 2 mg via INTRAVENOUS

## 2014-06-13 MED ORDER — PHENOL 1.4 % MT LIQD: 1.0000 | OROMUCOSAL | Status: DC | PRN

## 2014-06-13 MED ORDER — MORPHINE SULFATE 2 MG/ML IJ SOLN: 1.0000 mg | INTRAMUSCULAR | Status: DC | PRN

## 2014-06-13 MED ORDER — ALPRAZOLAM 0.5 MG PO TABS
2.0000 mg | ORAL_TABLET | Freq: Three times a day (TID) | ORAL | Status: DC | PRN
  Administered 2014-06-13: 2 mg via ORAL
  Filled 2014-06-13: qty 4

## 2014-06-13 MED ORDER — HYDROCODONE-ACETAMINOPHEN 5-325 MG PO TABS: 1.0000 | ORAL_TABLET | ORAL | Status: DC | PRN

## 2014-06-13 MED ORDER — ACETAMINOPHEN 325 MG PO TABS: 650.0000 mg | ORAL_TABLET | ORAL | Status: DC | PRN

## 2014-06-13 SURGICAL SUPPLY — 72 items
BLADE CLIPPER SURG (BLADE) IMPLANT
BUR MATCHSTICK NEURO 3.0 LAGG (BURR) ×2 IMPLANT
CONT SPEC 4OZ CLIKSEAL STRL BL (MISCELLANEOUS) IMPLANT
DECANTER SPIKE VIAL GLASS SM (MISCELLANEOUS) ×2 IMPLANT
DRAPE C-ARM 42X72 X-RAY (DRAPES) ×2 IMPLANT
DRAPE C-ARMOR (DRAPES) ×1 IMPLANT
DRAPE CAMERA VIDEO/LASER (DRAPES) ×2 IMPLANT
DRAPE ORTHO SPLIT 77X108 STRL (DRAPES) ×4
DRAPE POUCH INSTRU U-SHP 10X18 (DRAPES) ×2 IMPLANT
DRAPE SURG ORHT 6 SPLT 77X108 (DRAPES) ×2 IMPLANT
DURAPREP 26ML APPLICATOR (WOUND CARE) ×2 IMPLANT
ELECT CAUTERY BLADE 6.4 (BLADE) ×2 IMPLANT
GLOVE BIO SURGEON STRL SZ 6.5 (GLOVE) IMPLANT
GLOVE BIO SURGEON STRL SZ7 (GLOVE) IMPLANT
GLOVE BIO SURGEON STRL SZ7.5 (GLOVE) IMPLANT
GLOVE BIO SURGEON STRL SZ8 (GLOVE) IMPLANT
GLOVE BIO SURGEON STRL SZ8.5 (GLOVE) IMPLANT
GLOVE BIOGEL M 8.0 STRL (GLOVE) IMPLANT
GLOVE BIOGEL PI IND STRL 7.0 (GLOVE) IMPLANT
GLOVE BIOGEL PI INDICATOR 7.0 (GLOVE) ×2
GLOVE ECLIPSE 6.5 STRL STRAW (GLOVE) ×1 IMPLANT
GLOVE ECLIPSE 7.0 STRL STRAW (GLOVE) IMPLANT
GLOVE ECLIPSE 7.5 STRL STRAW (GLOVE) IMPLANT
GLOVE ECLIPSE 8.0 STRL XLNG CF (GLOVE) IMPLANT
GLOVE ECLIPSE 8.5 STRL (GLOVE) IMPLANT
GLOVE EXAM NITRILE LRG STRL (GLOVE) IMPLANT
GLOVE EXAM NITRILE MD LF STRL (GLOVE) IMPLANT
GLOVE EXAM NITRILE XL STR (GLOVE) IMPLANT
GLOVE EXAM NITRILE XS STR PU (GLOVE) IMPLANT
GLOVE INDICATOR 6.5 STRL GRN (GLOVE) IMPLANT
GLOVE INDICATOR 7.0 STRL GRN (GLOVE) IMPLANT
GLOVE INDICATOR 7.5 STRL GRN (GLOVE) IMPLANT
GLOVE INDICATOR 8.0 STRL GRN (GLOVE) IMPLANT
GLOVE INDICATOR 8.5 STRL (GLOVE) IMPLANT
GLOVE OPTIFIT SS 8.0 STRL (GLOVE) IMPLANT
GLOVE SS N UNI LF 6.5 STRL (GLOVE) ×1 IMPLANT
GLOVE SURG SS PI 6.5 STRL IVOR (GLOVE) IMPLANT
GLOVE SURG SS PI 7.0 STRL IVOR (GLOVE) ×3 IMPLANT
GOWN STRL REUS W/ TWL LRG LVL3 (GOWN DISPOSABLE) ×2 IMPLANT
GOWN STRL REUS W/ TWL XL LVL3 (GOWN DISPOSABLE) IMPLANT
GOWN STRL REUS W/TWL 2XL LVL3 (GOWN DISPOSABLE) IMPLANT
GOWN STRL REUS W/TWL LRG LVL3 (GOWN DISPOSABLE) ×4
GOWN STRL REUS W/TWL XL LVL3 (GOWN DISPOSABLE)
KIT BASIN OR (CUSTOM PROCEDURE TRAY) ×2 IMPLANT
KIT LEAD (KITS) ×1 IMPLANT
KIT NEUROSTIMULATOR ACCESSORY (KITS) ×1 IMPLANT
KIT ROOM TURNOVER OR (KITS) ×2 IMPLANT
LIQUID BAND (GAUZE/BANDAGES/DRESSINGS) ×2 IMPLANT
NDL HYPO 25X1 1.5 SAFETY (NEEDLE) ×1 IMPLANT
NDL SPNL 22GX3.5 QUINCKE BK (NEEDLE) IMPLANT
NEEDLE HYPO 25X1 1.5 SAFETY (NEEDLE) ×2 IMPLANT
NEEDLE SPNL 22GX3.5 QUINCKE BK (NEEDLE) IMPLANT
NS IRRIG 1000ML POUR BTL (IV SOLUTION) ×2 IMPLANT
PACK LAMINECTOMY NEURO (CUSTOM PROCEDURE TRAY) ×2 IMPLANT
PAD ARMBOARD 7.5X6 YLW CONV (MISCELLANEOUS) ×6 IMPLANT
PATTIES SURGICAL .5 X.5 (GAUZE/BANDAGES/DRESSINGS) ×1 IMPLANT
PROGRAMMER PATIENT (MISCELLANEOUS) ×1 IMPLANT
SPONGE SURGIFOAM ABS GEL SZ50 (HEMOSTASIS) ×2 IMPLANT
STIMULATOR SURESCAN SPINAL (Neurostimulator) ×1 IMPLANT
SUT SILK 2 0 TIES 10X30 (SUTURE) ×2 IMPLANT
SUT VIC AB 0 CT1 18XCR BRD8 (SUTURE) ×1 IMPLANT
SUT VIC AB 0 CT1 8-18 (SUTURE) ×2
SUT VIC AB 2-0 CT1 18 (SUTURE) ×2 IMPLANT
SUT VIC AB 3-0 SH 8-18 (SUTURE) ×2 IMPLANT
SYR 20ML ECCENTRIC (SYRINGE) ×2 IMPLANT
SYR BULB 3OZ (MISCELLANEOUS) ×2 IMPLANT
SYR CONTROL 10ML LL (SYRINGE) ×2 IMPLANT
SYSTEM RECHARG SURESCAN SPINE (Neurostimulator) ×1 IMPLANT
Specify 5-6-5 Lead Kit (Kit) ×1 IMPLANT
TOWEL OR 17X24 6PK STRL BLUE (TOWEL DISPOSABLE) ×2 IMPLANT
TOWEL OR 17X26 10 PK STRL BLUE (TOWEL DISPOSABLE) ×2 IMPLANT
WATER STERILE IRR 1000ML POUR (IV SOLUTION) ×2 IMPLANT

## 2014-06-13 NOTE — Anesthesia Preprocedure Evaluation (Addendum)
Anesthesia Evaluation  Patient identified by MRN, date of birth, ID band Patient awake    Reviewed: Allergy & Precautions, NPO status , Patient's Chart, lab work & pertinent test results, reviewed documented beta blocker date and time   Airway Mallampati: II   Neck ROM: Full    Dental  (+) Teeth Intact   Pulmonary asthma (inhaler) , former smoker (quit 2013),  NEG sleep study breath sounds clear to auscultation        Cardiovascular hypertension, Pt. on medications Rhythm:Regular  EKG 08/2013 non specific ST changes   Neuro/Psych Anxiety Depression Bipolar Disorder    GI/Hepatic Neg liver ROS, GERD-  Medicated,  Endo/Other  negative endocrine ROS  Renal/GU negative Renal ROS   HCG neg 06/04/14    Musculoskeletal   Abdominal (+) + obese,   Peds  Hematology negative hematology ROS (+)   Anesthesia Other Findings   Reproductive/Obstetrics                            Anesthesia Physical Anesthesia Plan  ASA: II  Anesthesia Plan: General   Post-op Pain Management:    Induction: Intravenous  Airway Management Planned: Oral ETT  Additional Equipment:   Intra-op Plan:   Post-operative Plan: Extubation in OR  Informed Consent: I have reviewed the patients History and Physical, chart, labs and discussed the procedure including the risks, benefits and alternatives for the proposed anesthesia with the patient or authorized representative who has indicated his/her understanding and acceptance.     Plan Discussed with:   Anesthesia Plan Comments: (Hx of panic attacks Precedex may be helpful)        Anesthesia Quick Evaluation

## 2014-06-13 NOTE — H&P (Addendum)
  BP 116/71 mmHg  Pulse 91  Temp(Src) 98.9 F (37.2 C) (Oral)  Resp 20  Ht 5\' 7"  (1.702 m)  Wt 96.163 kg (212 lb)  BMI 33.20 kg/m2  SpO2 100% HOPI: Sharon Vazquez returns today. Her new MRI does not show disc recurrence. It shows some scar around the previously operated area. At this point, given the fact that she has such clearcut radicular pain, I gave her information and a CD with regard to possible spinal cord stimulator. Dr. Rigoberto Noel at [facility] would not accept her for a second opinion. Sharon Vazquez was teary again today and I did give her a refill of medication today. CURRENT MEDICATIONS: Medications are unchanged from visit on 03/24/2014. Abilify, Allegra, alprazolam, Amitiza, Cambia, clobetasol, clonidine, desonide cream, doxepin, Dulcolax, Elidel cream, EpiPen, Flexeril, Flonase, hydrocodone, ibuprofen, Inderal, methocarbamol, Nasonex, Nexium, Pataday, penicillin V, I think she actually just stopped that, permethrin cream, promethazine, QVAR, Topamax, triamcinolone, Ventolin, and zolpidem. She has a markedly antalgic gait. She is moving very slowly. She is still using a cane secondary to the pain. On examination, Patrick's maneuver puts Sharon Vazquez into tears. Any kind of movement of the left hip causes severe pain, flexion and extension, internal and external rotation. Adducting the legs while in the sitting position, adducting the thighs cause a great deal of pain. Reflexes were essentially trace, cannot exist at the knees, 1+ to the left ankle, 1+ at the right ankle. Spinal cord stimulator trial was beneficial, and she has opted for permanent placement of a spinal cord stimulator. She is admitted for said procedure. Risks and benefits including bleeding, infection, no pain relief, hardware failure, spinal cord damage, weakness in lower extremities, loss of bowel bladder function, and other risks were discussed,. She understands and wishes to proceed.

## 2014-06-13 NOTE — Anesthesia Postprocedure Evaluation (Signed)
  Anesthesia Post-op Note  Patient: Sharon Vazquez  Procedure(s) Performed: Procedure(s) with comments: LUMBAR SPINAL CORD STIMULATOR INSERTION (N/A) - permanent spinal cord stimulator insertion  Patient Location: PACU  Anesthesia Type:General  Level of Consciousness: awake, alert , oriented and patient cooperative  Airway and Oxygen Therapy: Patient Spontanous Breathing  Post-op Pain: mild  Post-op Assessment: Post-op Vital signs reviewed, Patient's Cardiovascular Status Stable, Respiratory Function Stable, Patent Airway, No signs of Nausea or vomiting and Pain level controlled  Post-op Vital Signs: stable  Last Vitals:  Filed Vitals:   06/13/14 1630  BP: 136/84  Pulse: 101  Temp: 36.8 C  Resp: 20    Complications: No apparent anesthesia complications

## 2014-06-13 NOTE — Transfer of Care (Signed)
Immediate Anesthesia Transfer of Care Note  Patient: Sharon Vazquez  Procedure(s) Performed: Procedure(s) with comments: LUMBAR SPINAL CORD STIMULATOR INSERTION (N/A) - permanent spinal cord stimulator insertion  Patient Location: PACU  Anesthesia Type:General  Level of Consciousness: awake, alert  and oriented  Airway & Oxygen Therapy: Patient Spontanous Breathing  Post-op Assessment: Report given to PACU RN  Post vital signs: Reviewed and stable  Complications: No apparent anesthesia complications

## 2014-06-13 NOTE — Op Note (Signed)
06/13/2014  5:16 PM  PATIENT:  Sharon Vazquez  34 y.o. female with chronic back pain admitted today for placement of a spinal cord stimulator  PRE-OPERATIVE DIAGNOSIS:  lumbar radiculopathy low back pain  POST-OPERATIVE DIAGNOSIS:  lumbar radiculopathy low back pain  PROCEDURE:  Procedure(s): thoracic SPINAL CORD STIMULATOR INSERTION  SURGEON: Surgeon(s): Ashok Pall, MD  ASSISTANTS:none  ANESTHESIA:   general  EBL:  Total I/O In: 1000 [I.V.:1000] Out: -   BLOOD ADMINISTERED:none  CELL SAVER GIVEN:none  COUNT:per nursing  DRAINS: none   SPECIMEN:  No Specimen  DICTATION: NOLIE BIGNELL was taken to the operating room, intubated, and placed under a general anesthetic without difficulty. [name]'s back was prepped and draped in a sterile manner. I opened the skin with a 10 blade taking the incision down through the subcutaneous tissue to the thoracolumbar fascia. I used the monopolar cautery to expose the lamina of T10 bilaterally. I used the drill, Leksell rongeur, and Kerrison punches to perform a bilateral laminectomy of T10. I then placed the paddle lead to fully cover the T8 vertebral body, and its final position was at the midpoint of T7.  I then opened the skin in the paraspinous region to the right below her beltline in a horizontal manner. I created a pocket to hold the stimulator subcutaneously then tunneled from the pocket to the thoracic incision. I fed the wires through the plastic tube and connected them to the stimulator. I then irrigated both incisions and closed in a layered manner. All of the electronics were functional prior to closing. I used dermabond for a sterile dressing.   PLAN OF CARE: Admit for overnight observation  PATIENT DISPOSITION:  PACU - hemodynamically stable.   Delay start of Pharmacological VTE agent (>24hrs) due to surgical blood loss or risk of bleeding:  yes

## 2014-06-14 DIAGNOSIS — M5416 Radiculopathy, lumbar region: Secondary | ICD-10-CM | POA: Diagnosis not present

## 2014-06-14 MED ORDER — OXYCODONE-ACETAMINOPHEN 5-325 MG PO TABS: 1.0000 | ORAL_TABLET | ORAL | Status: DC | PRN

## 2014-06-14 NOTE — Progress Notes (Signed)
UR completed 

## 2014-06-14 NOTE — Discharge Summary (Signed)
Physician Discharge Summary  Patient ID: Sharon Vazquez MRN: [id number] DOB/AGE: [birth year] 34 y.o.  Admit date: 06/13/2014 Discharge date: 06/14/2014  Admission Diagnoses: Back pain and left lumbar radiculopathy, chronic  Discharge Diagnoses: Back pain and left lumbar radiculopathy, chronic Active Problems:   Chronic pain   Discharged Condition: fair  Hospital Course: Patient was admitted to undergo placement of a spinal cord stimulator trial. She tolerated procedure well.  Consults: None  Significant Diagnostic Studies: None  Treatments: Placement of spinal cords stimulator electrode trial  Discharge Exam: Blood pressure 124/70, pulse 99, temperature 97.9 F (36.6 C), temperature source Oral, resp. rate 18, height 5\' 7"  (1.702 m), weight 96.163 kg (212 lb), SpO2 100 %. Incision is clean and dry motor function is intact.  Disposition: 01-Home or Self Care  Discharge Instructions    Call MD for:  redness, tenderness, or signs of infection (pain, swelling, redness, odor or green/yellow discharge around incision site)    Complete by:  As directed      Call MD for:  severe uncontrolled pain    Complete by:  As directed      Call MD for:  temperature >100.4    Complete by:  As directed      Diet - low sodium heart healthy    Complete by:  As directed      Increase activity slowly    Complete by:  As directed             Medication List    TAKE these medications        albuterol 108 (90 BASE) MCG/ACT inhaler  Commonly known as:  PROVENTIL HFA;VENTOLIN HFA  Inhale 2 puffs into the lungs every 4 (four) hours as needed for wheezing or shortness of breath.     alclomethasone 0.05 % cream  Commonly known as:  ACLOVATE  Apply 1 application topically 2 (two) times daily as needed (dermatitis).     alprazolam 2 MG tablet  Commonly known as:  XANAX  Take 2 mg by mouth 3 (three) times daily as needed for anxiety (panic attacks).     ALUVEA 39 % Crea  Generic drug:   Urea  Apply 1 application topically 2 (two) times daily as needed (dry feet).     ARIPiprazole 9.75 MG/1.3ML injection  Commonly known as:  ABILIFY  Inject 400 mg into the muscle every 30 (thirty) days.     ASMANEX HFA 200 MCG/ACT Aero  Generic drug:  Mometasone Furoate  Inhale 1 puff into the lungs daily.     beclomethasone 80 MCG/ACT inhaler  Commonly known as:  QVAR  Inhale 2 puffs into the lungs daily.     CAMBIA 50 MG Pack  Generic drug:  Diclofenac Potassium  Take 50 mg by mouth daily as needed (migraines).     cloNIDine 0.1 MG tablet  Commonly known as:  CATAPRES  Take 0.1 mg by mouth 2 (two) times daily.     doxepin 50 MG capsule  Commonly known as:  SINEQUAN  Take 50 mg by mouth 3 (three) times daily.     EPIPEN 0.3 mg/0.3 mL Soaj injection  Generic drug:  EPINEPHrine  Inject 0.3 mg into the muscle as needed (allergic reaction).     esomeprazole 40 MG capsule  Commonly known as:  NEXIUM  Take 1 capsule (40 mg total) by mouth daily at 12 noon.     fexofenadine 180 MG tablet  Commonly known as:  ALLEGRA  Take 180 mg  by mouth daily.     hydrocortisone-pramoxine rectal foam  Commonly known as:  PROCTOFOAM HC  Place 1 applicator rectally 2 (two) times daily.     lubiprostone 24 MCG capsule  Commonly known as:  AMITIZA  Take 1 capsule (24 mcg total) by mouth 2 (two) times daily with a meal.     medroxyPROGESTERone 150 MG/ML injection  Commonly known as:  DEPO-PROVERA  Inject 1 mL (150 mg total) into the muscle every 3 (three) months.     methocarbamol 500 MG tablet  Commonly known as:  ROBAXIN  Take 1 tablet (500 mg total) by mouth 3 (three) times daily between meals as needed.     NASONEX 50 MCG/ACT nasal spray  Generic drug:  mometasone  Place 2 sprays into the nose daily.     nystatin 100000 UNIT/GM Powd  Apply 1 g topically See admin instructions. Apply two-three times a day to affected area as needed     oxyCODONE-acetaminophen 5-325 MG per tablet   Commonly known as:  ROXICET  Take 1 tablet by mouth every 6 (six) hours as needed for severe pain.     oxyCODONE-acetaminophen 5-325 MG per tablet  Commonly known as:  ROXICET  Take 1-2 tablets by mouth every 4 (four) hours as needed for severe pain.     PATADAY 0.2 % Soln  Generic drug:  Olopatadine HCl  Place 1 drop into both eyes daily as needed (itchy eyes).     pimecrolimus 1 % cream  Commonly known as:  ELIDEL  Apply 1 application topically 2 (two) times daily as needed (facial rash).     promethazine 25 MG tablet  Commonly known as:  PHENERGAN  Take 25 mg by mouth every 6 (six) hours as needed for nausea or vomiting.     propranolol 10 MG tablet  Commonly known as:  INDERAL  Take 10 mg by mouth 3 (three) times daily.     TROKENDI XR 100 MG Cp24  Generic drug:  Topiramate ER  Take 100 mg by mouth at bedtime.     zolpidem 12.5 MG CR tablet  Commonly known as:  AMBIEN CR  Take 12.5 mg by mouth at bedtime.         SignedEarleen Newport 06/14/2014, 8:14 AM

## 2014-06-14 NOTE — Discharge Instructions (Signed)
Wound Care Leave incision open to air. You may shower. Do not scrub directly on incision.  Do not put any creams, lotions, or ointments on incision. Activity Walk each and every day, increasing distance each day. No lifting greater than 5 lbs.  Avoid bending, arching, and twisting. No driving for 2 weeks; may ride as a passenger locally. If provided with back brace, wear when out of bed.  It is not necessary to wear in bed. Diet Resume your normal diet.  Return to Work Will be discussed at you follow up appointment. Call Your Doctor If Any of These Occur Redness, drainage, or swelling at the wound.  Temperature greater than 101 degrees. Severe pain not relieved by pain medication. Incision starts to come apart. Follow Up Appt Call today for appointment in 4 weeks ([phone]) or for problems.  If you have any hardware placed in your spine, you will need an x-ray before your appointment.   Spinal Cord Stimulation Trial A spinal cord stimulation trial uses a spinal cord stimulator to see if your pain can be reduced. The spinal cord simulator is a small device with wire (leads) that are placed in your back. The stimulator sends electrical pulses to the spinal cord. These electrical pulses block the nerve impulses that cause pain. If the spinal cord stimulation trial reduces your pain, a permanent spinal cord stimulator may be placed (implanted). SPINAL CORD STIMULATOR PLACEMENT The spinal cord stimulator will be put in your back. Where the spinal cord stimulator is placed depends on where your pain is located.For a trial, the entire device is not put under your skin (implanted). Only the leads that go from the stimulator to the spinal cord are implanted. HOME CARE INSTRUCTIONS  You will be given information about the spinal cord stimulator. It will include:  How and when to use spinal cord stimulator.  How to care for the incision site and bandage.  What type of activity you can and  cannot do.  What records to keep. You will have to keep a log of when you use it. You will need to say if your pain is less, the same or worse when the stimulator is in use. These records will show your caregiver whether the stimulator will work for you long-term.  In 3 to 5 days, you will need to come back to the hospital or clinic. You and your caregiver will discuss how the spinal cord stimulator worked for you and whether a permanent one should be placed.  Someone will need to drive you home after the spinal cord stimulator was placed.  Take medication as directed. Ask your caregiver if it is okay to take over-the-counter medicine. SEEK MEDICAL CARE IF:  The stimulator insertion site is red or swollen.  Blood or fluid leaks from the stimulator insertion site. SEEK IMMEDIATE MEDICAL CARE IF:   The stimulator leads come out.  The bandage that covers the stimulator leads comes off.  Your pain gets worse.  You develop numbness or weakness in your legs or you have trouble walking.  You have problems urinating or having a bowel movement.  You have a fever or persistent symptoms for more than 2-3 days.  You have a fever and your symptoms suddenly get worse. Document Released: 09/07/2010 Document Revised: 05/09/2012 Document Reviewed: 09/07/2010 [facility] Patient Information 2015 [place], [place]. This information is not intended to replace advice given to you by your health care provider. Make sure you discuss any questions you have with your  health care provider. ° °

## 2014-06-14 NOTE — Progress Notes (Signed)
Pt given D/C instructions with Rx, verbal understanding was provided. Pt's incisions are open to air and have no sign of infection. Pt's IV was removed prior to D/C. Pt D/C'd home via wheelchair @ 0830 per MD order. Pt is stable @ D/C and has no other needs at this time. Holli Humbles, RN

## 2014-06-17 ENCOUNTER — Encounter (HOSPITAL_COMMUNITY): Payer: Self-pay | Admitting: Neurosurgery

## 2014-06-25 ENCOUNTER — Encounter: Payer: Self-pay | Admitting: Adult Health

## 2014-06-25 ENCOUNTER — Ambulatory Visit (INDEPENDENT_AMBULATORY_CARE_PROVIDER_SITE_OTHER): Payer: 59 | Admitting: Adult Health

## 2014-06-25 DIAGNOSIS — Z3042 Encounter for surveillance of injectable contraceptive: Secondary | ICD-10-CM

## 2014-06-25 DIAGNOSIS — Z3202 Encounter for pregnancy test, result negative: Secondary | ICD-10-CM

## 2014-06-25 LAB — POCT URINE PREGNANCY: PREG TEST UR: NEGATIVE

## 2014-06-25 MED ORDER — MEDROXYPROGESTERONE ACETATE 150 MG/ML IM SUSP
150.0000 mg | Freq: Once | INTRAMUSCULAR | Status: AC
  Administered 2014-06-25: 150 mg via INTRAMUSCULAR

## 2014-06-26 ENCOUNTER — Ambulatory Visit: Payer: BC Managed Care – PPO

## 2014-07-01 ENCOUNTER — Other Ambulatory Visit (HOSPITAL_COMMUNITY): Payer: Self-pay | Admitting: Neurosurgery

## 2014-07-08 ENCOUNTER — Inpatient Hospital Stay (HOSPITAL_COMMUNITY)
Admission: RE | Admit: 2014-07-08 | Discharge: 2014-07-08 | Disposition: A | Discharge status: Home / Self Care | Payer: 59 | Source: Ambulatory Visit

## 2014-07-08 NOTE — Pre-Procedure Instructions (Signed)
Sharon Vazquez  07/08/2014   Your procedure is scheduled on:  07/11/14  Report to [facility] short stay admitting at 1100 AM.  Call this number if you have problems the morning of surgery: [phone]   Remember:   Do not eat food or drink liquids after midnight.   Take these medicines the morning of surgery with A SIP OF WATER: inhalers, xanax,pain med as needed, clonidine, doxepin, nexium, inderol     STOP all herbel meds, nsaids (aleve,naproxen,advil,ibuprofen) starting now diclofenac, vitamins, apirin   Do not wear jewelry, make-up or nail polish.  Do not wear lotions, powders, or perfumes. You may wear deodorant.  Do not shave 48 hours prior to surgery. Men may shave face and neck.  Do not bring valuables to the hospital.  [facility] is not responsible                  for any belongings or valuables.               Contacts, dentures or bridgework may not be worn into surgery.  Leave suitcase in the car. After surgery it may be brought to your room.  For patients admitted to the hospital, discharge time is determined by your                treatment team.               Patients discharged the day of surgery will not be allowed to drive  home.  Name and phone number of your driver:   Special Instructions:  Special Instructions: [place] - Preparing for Surgery  Before surgery, you can play an important role.  Because skin is not sterile, your skin needs to be as free of germs as possible.  You can reduce the number of germs on you skin by washing with CHG (chlorahexidine gluconate) soap before surgery.  CHG is an antiseptic cleaner which kills germs and bonds with the skin to continue killing germs even after washing.  Please DO NOT use if you have an allergy to CHG or antibacterial soaps.  If your skin becomes reddened/irritated stop using the CHG and inform your nurse when you arrive at Short Stay.  Do not shave (including legs and underarms) for at least 48 hours prior to  the first CHG shower.  You may shave your face.  Please follow these instructions carefully:   1.  Shower with CHG Soap the night before surgery and the morning of Surgery.  2.  If you choose to wash your hair, wash your hair first as usual with your normal shampoo.  3.  After you shampoo, rinse your hair and body thoroughly to remove the Shampoo.  4.  Use CHG as you would any other liquid soap.  You can apply chg directly  to the skin and wash gently with scrungie or a clean washcloth.  5.  Apply the CHG Soap to your body ONLY FROM THE NECK DOWN.  Do not use on open wounds or open sores.  Avoid contact with your eyes ears, mouth and genitals (private parts).  Wash genitals (private parts)       with your normal soap.  6.  Wash thoroughly, paying special attention to the area where your surgery will be performed.  7.  Thoroughly rinse your body with warm water from the neck down.  8.  DO NOT shower/wash with your normal soap after using and rinsing off the CHG Soap.  9.  Pat yourself dry with a clean towel.            10.  Wear clean pajamas.            11.  Place clean sheets on your bed the night of your first shower and do not sleep with pets.  Day of Surgery  Do not apply any lotions/deodorants the morning of surgery.  Please wear clean clothes to the hospital/surgery center.   Please read over the following fact sheets that you were given: Pain Booklet, Coughing and Deep Breathing and Surgical Site Infection Prevention

## 2014-07-11 ENCOUNTER — Ambulatory Visit (HOSPITAL_COMMUNITY): Admission: RE | Admit: 2014-07-11 | Payer: 59 | Source: Ambulatory Visit | Admitting: Neurosurgery

## 2014-07-11 ENCOUNTER — Encounter (HOSPITAL_COMMUNITY): Admission: RE | Payer: Self-pay | Source: Ambulatory Visit

## 2014-07-11 SURGERY — LUMBAR SPINAL CORD STIMULATOR REMOVAL
Anesthesia: General

## 2014-08-07 ENCOUNTER — Emergency Department (HOSPITAL_COMMUNITY): Payer: BLUE CROSS/BLUE SHIELD

## 2014-08-07 ENCOUNTER — Encounter (HOSPITAL_COMMUNITY): Payer: Self-pay | Admitting: Emergency Medicine

## 2014-08-07 ENCOUNTER — Emergency Department (HOSPITAL_COMMUNITY)
Admission: EM | Admit: 2014-08-07 | Discharge: 2014-08-07 | Disposition: A | Discharge status: Home / Self Care | Payer: BLUE CROSS/BLUE SHIELD | Attending: Emergency Medicine | Admitting: Emergency Medicine

## 2014-08-07 DIAGNOSIS — Z86010 Personal history of colonic polyps: Secondary | ICD-10-CM | POA: Diagnosis not present

## 2014-08-07 DIAGNOSIS — Y9389 Activity, other specified: Secondary | ICD-10-CM | POA: Insufficient documentation

## 2014-08-07 DIAGNOSIS — J45909 Unspecified asthma, uncomplicated: Secondary | ICD-10-CM | POA: Diagnosis not present

## 2014-08-07 DIAGNOSIS — S161XXA Strain of muscle, fascia and tendon at neck level, initial encounter: Secondary | ICD-10-CM | POA: Diagnosis not present

## 2014-08-07 DIAGNOSIS — I10 Essential (primary) hypertension: Secondary | ICD-10-CM | POA: Insufficient documentation

## 2014-08-07 DIAGNOSIS — Z791 Long term (current) use of non-steroidal anti-inflammatories (NSAID): Secondary | ICD-10-CM | POA: Insufficient documentation

## 2014-08-07 DIAGNOSIS — F410 Panic disorder [episodic paroxysmal anxiety] without agoraphobia: Secondary | ICD-10-CM | POA: Insufficient documentation

## 2014-08-07 DIAGNOSIS — Z8619 Personal history of other infectious and parasitic diseases: Secondary | ICD-10-CM | POA: Insufficient documentation

## 2014-08-07 DIAGNOSIS — Z872 Personal history of diseases of the skin and subcutaneous tissue: Secondary | ICD-10-CM | POA: Diagnosis not present

## 2014-08-07 DIAGNOSIS — E669 Obesity, unspecified: Secondary | ICD-10-CM | POA: Insufficient documentation

## 2014-08-07 DIAGNOSIS — G8929 Other chronic pain: Secondary | ICD-10-CM | POA: Diagnosis not present

## 2014-08-07 DIAGNOSIS — M549 Dorsalgia, unspecified: Secondary | ICD-10-CM

## 2014-08-07 DIAGNOSIS — M545 Low back pain, unspecified: Secondary | ICD-10-CM

## 2014-08-07 DIAGNOSIS — S3992XA Unspecified injury of lower back, initial encounter: Secondary | ICD-10-CM | POA: Insufficient documentation

## 2014-08-07 DIAGNOSIS — Z79899 Other long term (current) drug therapy: Secondary | ICD-10-CM | POA: Diagnosis not present

## 2014-08-07 DIAGNOSIS — Y92410 Unspecified street and highway as the place of occurrence of the external cause: Secondary | ICD-10-CM | POA: Diagnosis not present

## 2014-08-07 DIAGNOSIS — F319 Bipolar disorder, unspecified: Secondary | ICD-10-CM | POA: Diagnosis not present

## 2014-08-07 DIAGNOSIS — S0990XA Unspecified injury of head, initial encounter: Secondary | ICD-10-CM | POA: Insufficient documentation

## 2014-08-07 DIAGNOSIS — Z87891 Personal history of nicotine dependence: Secondary | ICD-10-CM | POA: Diagnosis not present

## 2014-08-07 DIAGNOSIS — Z7952 Long term (current) use of systemic steroids: Secondary | ICD-10-CM | POA: Insufficient documentation

## 2014-08-07 DIAGNOSIS — K219 Gastro-esophageal reflux disease without esophagitis: Secondary | ICD-10-CM | POA: Insufficient documentation

## 2014-08-07 DIAGNOSIS — Y998 Other external cause status: Secondary | ICD-10-CM | POA: Diagnosis not present

## 2014-08-07 DIAGNOSIS — S3991XA Unspecified injury of abdomen, initial encounter: Secondary | ICD-10-CM | POA: Insufficient documentation

## 2014-08-07 DIAGNOSIS — Z91040 Latex allergy status: Secondary | ICD-10-CM | POA: Insufficient documentation

## 2014-08-07 DIAGNOSIS — R109 Unspecified abdominal pain: Secondary | ICD-10-CM

## 2014-08-07 LAB — CBC WITH DIFFERENTIAL/PLATELET
BASOS ABS: 0 10*3/uL (ref 0.0–0.1)
BASOS PCT: 0 % (ref 0–1)
Eosinophils Absolute: 0.4 10*3/uL (ref 0.0–0.7)
Eosinophils Relative: 3 % (ref 0–5)
HCT: 42.8 % (ref 36.0–46.0)
Hemoglobin: 15.2 g/dL — ABNORMAL HIGH (ref 12.0–15.0)
Lymphocytes Relative: 35 % (ref 12–46)
Lymphs Abs: 4.5 10*3/uL — ABNORMAL HIGH (ref 0.7–4.0)
MCH: 32.2 pg (ref 26.0–34.0)
MCHC: 35.5 g/dL (ref 30.0–36.0)
MCV: 90.7 fL (ref 78.0–100.0)
Monocytes Absolute: 0.6 10*3/uL (ref 0.1–1.0)
Monocytes Relative: 4 % (ref 3–12)
NEUTROS ABS: 7.6 10*3/uL (ref 1.7–7.7)
NEUTROS PCT: 58 % (ref 43–77)
PLATELETS: 356 10*3/uL (ref 150–400)
RBC: 4.72 MIL/uL (ref 3.87–5.11)
RDW: 13.7 % (ref 11.5–15.5)
WBC: 13.1 10*3/uL — ABNORMAL HIGH (ref 4.0–10.5)

## 2014-08-07 MED ORDER — HYDROCODONE-ACETAMINOPHEN 5-325 MG PO TABS: 1.0000 | ORAL_TABLET | Freq: Four times a day (QID) | ORAL | Status: DC | PRN

## 2014-08-07 MED ORDER — SODIUM CHLORIDE 0.9 % IV BOLUS (SEPSIS)
250.0000 mL | Freq: Once | INTRAVENOUS | Status: AC
  Administered 2014-08-07: 250 mL via INTRAVENOUS

## 2014-08-07 MED ORDER — SODIUM CHLORIDE 0.9 % IV SOLN
INTRAVENOUS | Status: DC
  Administered 2014-08-07: 19:00:00 via INTRAVENOUS

## 2014-08-07 MED ORDER — PROMETHAZINE HCL 25 MG PO TABS: 25.0000 mg | ORAL_TABLET | Freq: Four times a day (QID) | ORAL | Status: DC | PRN

## 2014-08-07 MED ORDER — NAPROXEN 500 MG PO TABS: 500.0000 mg | ORAL_TABLET | Freq: Two times a day (BID) | ORAL | Status: DC

## 2014-08-07 MED ORDER — ONDANSETRON HCL 4 MG/2ML IJ SOLN
4.0000 mg | Freq: Once | INTRAMUSCULAR | Status: AC
  Administered 2014-08-07: 4 mg via INTRAVENOUS
  Filled 2014-08-07: qty 2

## 2014-08-07 MED ORDER — IOHEXOL 300 MG/ML  SOLN
100.0000 mL | Freq: Once | INTRAMUSCULAR | Status: AC | PRN
  Administered 2014-08-07: 100 mL via INTRAVENOUS

## 2014-08-07 MED ORDER — MORPHINE SULFATE 2 MG/ML IJ SOLN
2.0000 mg | Freq: Once | INTRAMUSCULAR | Status: AC
  Administered 2014-08-07: 2 mg via INTRAVENOUS
  Filled 2014-08-07: qty 1

## 2014-08-07 NOTE — Discharge Instructions (Signed)
Take medications as directed. Rest for the next couple days. Return for any new or worse symptoms. CT scans without significant findings. Except for the fact that there was some small nodules in the chest they recommend follow-up CT scan in 3 months. This can be done by your primary care doctor. Or he can return here to have that done.
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
## 2014-08-26 ENCOUNTER — Encounter: Payer: Self-pay | Admitting: Adult Health

## 2014-08-26 ENCOUNTER — Ambulatory Visit (INDEPENDENT_AMBULATORY_CARE_PROVIDER_SITE_OTHER): Payer: BLUE CROSS/BLUE SHIELD | Admitting: Adult Health

## 2014-08-26 VITALS — BP 130/76 | HR 92 | Ht 64.0 in | Wt 212.0 lb

## 2014-08-26 DIAGNOSIS — Z3042 Encounter for surveillance of injectable contraceptive: Secondary | ICD-10-CM

## 2014-08-26 DIAGNOSIS — Z01419 Encounter for gynecological examination (general) (routine) without abnormal findings: Secondary | ICD-10-CM | POA: Diagnosis not present

## 2014-08-26 DIAGNOSIS — N6331 Unspecified lump in axillary tail of the right breast: Secondary | ICD-10-CM | POA: Insufficient documentation

## 2014-08-26 DIAGNOSIS — B369 Superficial mycosis, unspecified: Secondary | ICD-10-CM

## 2014-08-26 DIAGNOSIS — N6310 Unspecified lump in the right breast, unspecified quadrant: Secondary | ICD-10-CM

## 2014-08-26 MED ORDER — NYSTATIN 100000 UNIT/GM EX POWD: 1.0000 g | CUTANEOUS | Status: DC

## 2014-08-26 MED ORDER — MEDROXYPROGESTERONE ACETATE 150 MG/ML IM SUSP: 150.0000 mg | INTRAMUSCULAR | Status: DC

## 2014-08-26 NOTE — Progress Notes (Signed)
Patient ID: Sharon Vazquez, female   DOB: 1981-03-09, 34 y.o.   MRN: [id number] History of Present Illness: Sharon Vazquez is a 34 year old black female in for well woman exam.She had a normal pap with negative HPV 07/03/12.She complains of rash under breasts and hot flashes at night, she is on depo provera injections.She had a nerve stimulator implanted in January and says back still hurts.And she is gassy at times.   Current Medications, Allergies, Past Medical History, Past Surgical History, Family History and Social History were reviewed in [employer] record.     Review of Systems: Patient denies any headaches, hearing loss, fatigue, blurred vision, shortness of breath, chest pain, abdominal pain, problems with bowel movements, urination, or intercourse. No joint pain or mood swings. See HPI for positives.   Physical Exam:BP 130/76 mmHg  Pulse 92  Ht 5\' 4"  (1.626 m)  Wt 212 lb (96.163 kg)  BMI 36.37 kg/m2  LMP 05/08/2014 General:  Well developed, well nourished, no acute distress Skin:  Warm and dry Neck:  Midline trachea, normal thyroid, good ROM, no lymphadenopathy Lungs; Clear to auscultation bilaterally Breast:  No dominant palpable mass, retraction, or nipple discharge on left, on right , no retraction or nipple discharge but has nodule at 7 and 9 o'clock that is tender.Has skin fungus under both breasts Cardiovascular: Regular rate and rhythm Abdomen:  Soft, non tender, no hepatosplenomegaly Pelvic:  External genitalia is normal in appearance, no lesions.  The vagina is normal in appearance. Urethra has no lesions or masses. The cervix is smooth.  Uterus is felt to be normal size, shape, and contour.  No adnexal masses or tenderness noted.Bladder is non tender, no masses felt. Extremities/musculoskeletal:  No swelling or varicosities noted, no clubbing or cyanosis Psych:  No mood changes, alert and cooperative,seems happy She says she is seeing Dr Laural Golden  next week and sees back doctor Thursday, Dr Cyndy Freeze.  Impression: Well woman gyn exam no pap Right breast nodules Skin fungus Contraceptive management   Plan: Pap and physical in 1 year Diagnostic mammogram and right breast US 3/29 at 12:45 at [facility] Rx Depo provera 150 mg, disp.# 1 vail for IM injection every 3 months in office with 4 refills Rx nystatin powder 30 gm with 2 refills, use 2-3 x daily prn Review handout on breast cysts and skin fungus

## 2014-08-26 NOTE — Patient Instructions (Signed)
Breast Cyst A breast cyst is a sac in the breast that is filled with fluid. Breast cysts are common in women. Women can have one or many cysts. When the breasts contain many cysts, it is usually due to a noncancerous (benign) condition called fibrocystic change. These lumps form under the influence of female hormones (estrogen and progesterone). The lumps are most often located in the upper, outer portion of the breast. They are often more swollen, painful, and tender before your period starts. They usually disappear after menopause, unless you are on hormone therapy.  There are several types of cysts:  Macrocyst. This is a cyst that is about 2 in. (5.1 cm) in diameter.   Microcyst. This is a tiny cyst that you cannot feel but can be seen with a mammogram or an ultrasound.   Galactocele. This is a cyst containing milk that may develop if you suddenly stop breastfeeding.   Sebaceous cyst of the skin. This type of cyst is not in the breast tissue itself. Breast cysts do not increase your risk of breast cancer. However, they must be monitored closely because they can be cancerous.  CAUSES  It is not known exactly what causes a breast cyst to form. Possible causes include:  An overgrowth of milk glands and connective tissue in the breast can block the milk glands, causing them to fill with fluid.   Scar tissue in the breast from previous surgery may block the glands, causing a cyst.  RISK FACTORS Estrogen may influence the development of a breast cyst.  SIGNS AND SYMPTOMS   Feeling a smooth, round, soft lump (like a grape) in the breast that is easily moveable.   Breast discomfort or pain.  Increase in size of the lump before your menstrual period and decrease in its size after your menstrual period.  DIAGNOSIS  A cyst can be felt during a physical exam by your health care provider. A breast X-ray exam (mammogram) and ultrasonography will be done to confirm the diagnosis. Fluid may  be removed from the cyst with a needle (fine needle aspiration) to make sure the cyst is not cancerous.  TREATMENT  Treatment may not be necessary. Your health care provider may monitor the cyst to see if it goes away on its own. If treatment is needed, it may include:  Hormone treatment.   Needle aspiration. There is a chance of the cyst coming back after aspiration.   Surgery to remove the whole cyst.  HOME CARE INSTRUCTIONS   Keep all follow-up appointments with your health care provider.  See your health care provider regularly:  Get a yearly exam by your health care provider.  Have a clinical breast exam by a health care provider every 1-3 years if you are 20-40 years of age. After age 40 years, you should have the exam every year.   Get mammogram tests as directed by your health care provider.   Understand the normal appearance and feel of your breasts and perform breast self-exams.   Only take over-the-counter or prescription medicines as directed by your health care provider.   Wear a supportive bra, especially when exercising.   Avoid caffeine.   Reduce your salt intake, especially before your menstrual period. Too much salt can cause fluid retention, breast swelling, and discomfort.  SEEK MEDICAL CARE IF:   You feel, or think you feel, a lump in your breast.   You notice that both breasts look or feel different than usual.   Your   breast is still causing pain after your menstrual period is over.   You need medicine for breast pain and swelling that occurs with your menstrual period.  SEEK IMMEDIATE MEDICAL CARE IF:   You have severe pain, tenderness, redness, or warmth in your breast.   You have nipple discharge or bleeding.   Your breast lump becomes hard and painful.   You find new lumps or bumps that were not there before.   You feel lumps in your armpit (axilla).   You notice dimpling or wrinkling of the breast or nipple.   You  have a fever.  MAKE SURE YOU:  Understand these instructions.  Will watch your condition.  Will get help right away if you are not doing well or get worse. Document Released: 05/23/2005 Document Revised: 01/23/2013 Document Reviewed: 12/20/2012 [facility] Patient Information 2015 [place], [place]. This information is not intended to replace advice given to you by your health care provider. Make sure you discuss any questions you have with your health care provider. Yeast Infection of the Skin Some yeast on the skin is normal, but sometimes it causes an infection. If you have a yeast infection, it shows up as white or light brown patches on brown skin. You can see it better in the summer on tan skin. It causes light-colored holes in your suntan. It can happen on any area of the body. This cannot be passed from person to person. HOME CARE  Scrub your skin daily with a dandruff shampoo. Your rash may take a couple weeks to get well.  Do not scratch or itch the rash. GET HELP RIGHT AWAY IF:   You get another infection from scratching. The skin may get warm, red, and may ooze fluid.  The infection does not seem to be getting better. MAKE SURE YOU:  Understand these instructions.  Will watch your condition.  Will get help right away if you are not doing well or get worse. Document Released: 05/05/2008 Document Revised: 08/15/2011 Document Reviewed: 05/05/2008 [facility] Patient Information 2015 [place], [place]. This information is not intended to replace advice given to you by your health care provider. Make sure you discuss any questions you have with your health care provider. Mammogram 3/29 at 12:45 Pap and physical in 1 year Depo as scheduled

## 2014-09-02 ENCOUNTER — Ambulatory Visit (HOSPITAL_COMMUNITY)
Admission: RE | Admit: 2014-09-02 | Discharge: 2014-09-02 | Disposition: A | Discharge status: Home / Self Care | Payer: BLUE CROSS/BLUE SHIELD | Source: Ambulatory Visit | Attending: Adult Health | Admitting: Adult Health

## 2014-09-02 ENCOUNTER — Encounter (INDEPENDENT_AMBULATORY_CARE_PROVIDER_SITE_OTHER): Payer: Self-pay | Admitting: Internal Medicine

## 2014-09-02 ENCOUNTER — Ambulatory Visit (INDEPENDENT_AMBULATORY_CARE_PROVIDER_SITE_OTHER): Payer: BLUE CROSS/BLUE SHIELD | Admitting: Internal Medicine

## 2014-09-02 VITALS — BP 110/80 | HR 104 | Temp 97.5°F | Resp 18 | Ht 66.0 in | Wt 215.5 lb

## 2014-09-02 DIAGNOSIS — N6310 Unspecified lump in the right breast, unspecified quadrant: Secondary | ICD-10-CM

## 2014-09-02 DIAGNOSIS — K5900 Constipation, unspecified: Secondary | ICD-10-CM | POA: Diagnosis not present

## 2014-09-02 DIAGNOSIS — N63 Unspecified lump in breast: Secondary | ICD-10-CM | POA: Diagnosis present

## 2014-09-02 DIAGNOSIS — R1030 Lower abdominal pain, unspecified: Secondary | ICD-10-CM | POA: Diagnosis not present

## 2014-09-02 DIAGNOSIS — K625 Hemorrhage of anus and rectum: Secondary | ICD-10-CM

## 2014-09-02 DIAGNOSIS — R1084 Generalized abdominal pain: Secondary | ICD-10-CM | POA: Diagnosis not present

## 2014-09-02 DIAGNOSIS — G8929 Other chronic pain: Secondary | ICD-10-CM

## 2014-09-02 DIAGNOSIS — K5909 Other constipation: Secondary | ICD-10-CM

## 2014-09-02 NOTE — Progress Notes (Signed)
Presenting complaint;  Follow-up for GERD constipation and abdominal pain.  Subjective:  Patient is 34 year old F medical female who is here for scheduled visit accompanied by her aunt. She was last seen 6 months ago. She says hot joints well controlled with Nexium but her co-pay is too high. She continues to complain of lower abdominal pain and constipation. She states Amitiza has worked the best but once again her co-pay is too high. She has been getting samples. Her bowels move twice a week with Amitiza and without it she may go 2 weeks without a bowel movement. She's been having intermittent hematochezia responding to Proctofoam HC. She had colonoscopy 2 years ago with removal of 10 mm polyp which was sessile serrated polyp. Also had external hemorrhoids. He continues to complain of back pain. She had surgery on 06/13/2014 with placement of stimulator is not working anymore and she will have it removed in near future. He also complains of nausea with sporadic vomiting she denies hematemesis melena or frank rectal bleeding. Rectal bleeding is mainly in the form of blood on the tissue. She does not have a good appetite. However she has not lost any weight in the last 6 months.   Current Medications: Outpatient Encounter Prescriptions as of 09/02/2014  Medication Sig  . albuterol (PROVENTIL HFA;VENTOLIN HFA) 108 (90 BASE) MCG/ACT inhaler Inhale 2 puffs into the lungs every 4 (four) hours as needed for wheezing or shortness of breath.   Marland Kitchen alclomethasone (ACLOVATE) 0.05 % cream Apply 1 application topically 2 (two) times daily as needed (dermatitis).   Marland Kitchen alprazolam (XANAX) 2 MG tablet Take 2 mg by mouth 3 (three) times daily as needed for anxiety (panic attacks).  Marland Kitchen ALUVEA 39 % CREA Apply 1 application topically 2 (two) times daily as needed (dry feet).   . ARIPiprazole (ABILIFY) 9.75 MG/1.3ML injection Inject 400 mg into the muscle every 30 (thirty) days.  Marland Kitchen ASMANEX HFA 200 MCG/ACT AERO Inhale 1  puff into the lungs daily.   Marland Kitchen azelastine (OPTIVAR) 0.05 % ophthalmic solution Place 2 drops into both eyes as needed (allergies).   . beclomethasone (QVAR) 80 MCG/ACT inhaler Inhale 2 puffs into the lungs daily.   . cloNIDine (CATAPRES) 0.1 MG tablet Take 0.1 mg by mouth 2 (two) times daily.   . Diclofenac Potassium (CAMBIA) 50 MG PACK Take 50 mg by mouth daily as needed (migraines).  . doxepin (SINEQUAN) 50 MG capsule Take 50 mg by mouth 3 (three) times daily.   Marland Kitchen EPINEPHrine (EPIPEN) 0.3 mg/0.3 mL SOAJ injection Inject 0.3 mg into the muscle as needed (allergic reaction).  . hydrocortisone-pramoxine (PROCTOFOAM HC) rectal foam Place 1 applicator rectally 2 (two) times daily. (Patient taking differently: Place 1 applicator rectally as needed. )  . medroxyPROGESTERone (DEPO-PROVERA) 150 MG/ML injection Inject 1 mL (150 mg total) into the muscle every 3 (three) months.  . metaxalone (SKELAXIN) 800 MG tablet Take 800 mg by mouth every 6 (six) hours as needed for muscle spasms.  . Multiple Vitamins-Minerals (ALIVE WOMENS ENERGY PO) Take by mouth daily.  Marland Kitchen NASONEX 50 MCG/ACT nasal spray Place 2 sprays into the nose daily.   Marland Kitchen nystatin (MYCOSTATIN/NYSTOP) 100000 UNIT/GM POWD Apply 1 g topically See admin instructions. Apply two-three times a day to affected area as needed  . Olopatadine HCl (PATADAY) 0.2 % SOLN Place 1 drop into both eyes daily as needed (itchy eyes).  Marland Kitchen oxyCODONE-acetaminophen (ROXICET) 5-325 MG per tablet Take 1-2 tablets by mouth every 4 (four) hours as needed  for severe pain. (Patient taking differently: Take 1-2 tablets by mouth every 6 (six) hours as needed for severe pain. )  . pimecrolimus (ELIDEL) 1 % cream Apply 1 application topically 2 (two) times daily as needed (facial rash).  . promethazine (PHENERGAN) 25 MG tablet Take 1 tablet (25 mg total) by mouth every 6 (six) hours as needed.  . propranolol (INDERAL) 10 MG tablet Take 10 mg by mouth 3 (three) times daily.  Marland Kitchen  zolpidem (AMBIEN CR) 12.5 MG CR tablet Take 12.5 mg by mouth at bedtime.   Marland Kitchen esomeprazole (NEXIUM) 40 MG capsule Take 1 capsule (40 mg total) by mouth daily at 12 noon. (Patient not taking: Reported on 09/02/2014)  . lubiprostone (AMITIZA) 24 MCG capsule Take 1 capsule (24 mcg total) by mouth 2 (two) times daily with a meal. (Patient not taking: Reported on 08/26/2014)  . [DISCONTINUED] fexofenadine (ALLEGRA) 180 MG tablet Take 180 mg by mouth daily.  . [DISCONTINUED] TROKENDI XR 100 MG CP24 Take 100 mg by mouth at bedtime.      Objective: Blood pressure 110/80, pulse 104, temperature 97.5 F (36.4 C), temperature source Oral, resp. rate 18, height 5\' 6"  (1.676 m), weight 215 lb 8 oz (97.75 kg), last menstrual period 02/15/2014. Patient is alert and in no acute distress. Conjunctiva is pink. Sclera is nonicteric Oropharyngeal mucosa is normal. No neck masses or thyromegaly noted. Cardiac exam with regular rhythm normal S1 and S2. No murmur or gallop noted. Lungs are clear to auscultation. Abdomen is full. Bowel sounds are normal. On palpation abdomen is soft with mild tenderness in right lower quadrant. No organomegaly or masses.  No LE edema or clubbing noted.  Labs/studies Results: H&H 15.2 and 42.8 on 08/07/2014.  Assessment:  #1. Chronic GERD. Heartburn seem to respond to Nexium but her co-pay is too high.  #2. Chronic constipation most likely secondary to psychotropic and pain medication. #3. Recurrent hematochezia secondary to hemorrhoids in the setting of constipation and straining. #4. 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   Plan:  Patient given samples of Nexium OTC and Amitiza. Will try to get these medications authorize along with authorization for Proctofoam HC. Office visit in 6 months. Next colonoscopy would be March 2017.

## 2014-09-02 NOTE — Patient Instructions (Signed)
High fiber diet

## 2014-09-17 ENCOUNTER — Encounter: Payer: Self-pay | Admitting: *Deleted

## 2014-09-17 ENCOUNTER — Ambulatory Visit (INDEPENDENT_AMBULATORY_CARE_PROVIDER_SITE_OTHER): Payer: BLUE CROSS/BLUE SHIELD | Admitting: *Deleted

## 2014-09-17 DIAGNOSIS — Z3042 Encounter for surveillance of injectable contraceptive: Secondary | ICD-10-CM

## 2014-09-17 DIAGNOSIS — Z3202 Encounter for pregnancy test, result negative: Secondary | ICD-10-CM

## 2014-09-17 LAB — POCT URINE PREGNANCY: Preg Test, Ur: NEGATIVE

## 2014-09-17 MED ORDER — MEDROXYPROGESTERONE ACETATE 150 MG/ML IM SUSP
150.0000 mg | Freq: Once | INTRAMUSCULAR | Status: AC
  Administered 2014-09-17: 150 mg via INTRAMUSCULAR

## 2014-09-17 NOTE — Progress Notes (Signed)
Pt here for Depo. Reports no problems at this time. Return in 12 weeks for next shot. JSY 

## 2014-11-12 ENCOUNTER — Other Ambulatory Visit (HOSPITAL_COMMUNITY): Payer: Self-pay | Admitting: Nurse Practitioner

## 2014-11-12 ENCOUNTER — Ambulatory Visit (HOSPITAL_COMMUNITY)
Admission: RE | Admit: 2014-11-12 | Discharge: 2014-11-12 | Disposition: A | Discharge status: Home / Self Care | Payer: BLUE CROSS/BLUE SHIELD | Source: Ambulatory Visit | Attending: Nurse Practitioner | Admitting: Nurse Practitioner

## 2014-11-12 DIAGNOSIS — Z9049 Acquired absence of other specified parts of digestive tract: Secondary | ICD-10-CM | POA: Diagnosis not present

## 2014-11-12 DIAGNOSIS — R1084 Generalized abdominal pain: Secondary | ICD-10-CM | POA: Insufficient documentation

## 2014-11-12 DIAGNOSIS — R1012 Left upper quadrant pain: Secondary | ICD-10-CM

## 2014-11-12 DIAGNOSIS — R109 Unspecified abdominal pain: Secondary | ICD-10-CM

## 2014-11-17 ENCOUNTER — Other Ambulatory Visit (HOSPITAL_COMMUNITY): Payer: BLUE CROSS/BLUE SHIELD

## 2014-11-20 ENCOUNTER — Telehealth (INDEPENDENT_AMBULATORY_CARE_PROVIDER_SITE_OTHER): Payer: Self-pay | Admitting: *Deleted

## 2014-11-20 NOTE — Telephone Encounter (Signed)
Sharon Vazquez would like to see if she could please get samples of AMITIZA and [place]. The return phone number is [phone].

## 2014-11-23 ENCOUNTER — Emergency Department (HOSPITAL_COMMUNITY): Payer: BLUE CROSS/BLUE SHIELD

## 2014-11-23 ENCOUNTER — Emergency Department (HOSPITAL_COMMUNITY)
Admission: EM | Admit: 2014-11-23 | Discharge: 2014-11-24 | Disposition: A | Discharge status: Home / Self Care | Payer: BLUE CROSS/BLUE SHIELD | Attending: Emergency Medicine | Admitting: Emergency Medicine

## 2014-11-23 ENCOUNTER — Encounter (HOSPITAL_COMMUNITY): Payer: Self-pay | Admitting: *Deleted

## 2014-11-23 DIAGNOSIS — Z8619 Personal history of other infectious and parasitic diseases: Secondary | ICD-10-CM | POA: Insufficient documentation

## 2014-11-23 DIAGNOSIS — G4700 Insomnia, unspecified: Secondary | ICD-10-CM | POA: Insufficient documentation

## 2014-11-23 DIAGNOSIS — Z86010 Personal history of colonic polyps: Secondary | ICD-10-CM | POA: Diagnosis not present

## 2014-11-23 DIAGNOSIS — Z793 Long term (current) use of hormonal contraceptives: Secondary | ICD-10-CM | POA: Diagnosis not present

## 2014-11-23 DIAGNOSIS — Z720 Tobacco use: Secondary | ICD-10-CM | POA: Insufficient documentation

## 2014-11-23 DIAGNOSIS — K589 Irritable bowel syndrome without diarrhea: Secondary | ICD-10-CM | POA: Insufficient documentation

## 2014-11-23 DIAGNOSIS — Y9389 Activity, other specified: Secondary | ICD-10-CM | POA: Insufficient documentation

## 2014-11-23 DIAGNOSIS — I10 Essential (primary) hypertension: Secondary | ICD-10-CM | POA: Insufficient documentation

## 2014-11-23 DIAGNOSIS — Z79899 Other long term (current) drug therapy: Secondary | ICD-10-CM | POA: Diagnosis not present

## 2014-11-23 DIAGNOSIS — J45909 Unspecified asthma, uncomplicated: Secondary | ICD-10-CM | POA: Insufficient documentation

## 2014-11-23 DIAGNOSIS — K219 Gastro-esophageal reflux disease without esophagitis: Secondary | ICD-10-CM | POA: Diagnosis not present

## 2014-11-23 DIAGNOSIS — Y92410 Unspecified street and highway as the place of occurrence of the external cause: Secondary | ICD-10-CM | POA: Diagnosis not present

## 2014-11-23 DIAGNOSIS — Z91040 Latex allergy status: Secondary | ICD-10-CM | POA: Diagnosis not present

## 2014-11-23 DIAGNOSIS — S3992XA Unspecified injury of lower back, initial encounter: Secondary | ICD-10-CM | POA: Diagnosis not present

## 2014-11-23 DIAGNOSIS — Z872 Personal history of diseases of the skin and subcutaneous tissue: Secondary | ICD-10-CM | POA: Insufficient documentation

## 2014-11-23 DIAGNOSIS — Z7951 Long term (current) use of inhaled steroids: Secondary | ICD-10-CM | POA: Insufficient documentation

## 2014-11-23 DIAGNOSIS — M545 Low back pain: Secondary | ICD-10-CM

## 2014-11-23 DIAGNOSIS — G8929 Other chronic pain: Secondary | ICD-10-CM | POA: Diagnosis not present

## 2014-11-23 DIAGNOSIS — E669 Obesity, unspecified: Secondary | ICD-10-CM | POA: Insufficient documentation

## 2014-11-23 DIAGNOSIS — K5900 Constipation, unspecified: Secondary | ICD-10-CM | POA: Insufficient documentation

## 2014-11-23 DIAGNOSIS — Z8742 Personal history of other diseases of the female genital tract: Secondary | ICD-10-CM | POA: Diagnosis not present

## 2014-11-23 DIAGNOSIS — F319 Bipolar disorder, unspecified: Secondary | ICD-10-CM | POA: Insufficient documentation

## 2014-11-23 DIAGNOSIS — Y998 Other external cause status: Secondary | ICD-10-CM | POA: Diagnosis not present

## 2014-11-23 DIAGNOSIS — S161XXA Strain of muscle, fascia and tendon at neck level, initial encounter: Secondary | ICD-10-CM | POA: Insufficient documentation

## 2014-11-23 DIAGNOSIS — Z8651 Personal history of combat and operational stress reaction: Secondary | ICD-10-CM | POA: Diagnosis not present

## 2014-11-23 DIAGNOSIS — S199XXA Unspecified injury of neck, initial encounter: Secondary | ICD-10-CM | POA: Diagnosis present

## 2014-11-23 DIAGNOSIS — Z3202 Encounter for pregnancy test, result negative: Secondary | ICD-10-CM | POA: Insufficient documentation

## 2014-11-23 DIAGNOSIS — F410 Panic disorder [episodic paroxysmal anxiety] without agoraphobia: Secondary | ICD-10-CM | POA: Insufficient documentation

## 2014-11-23 LAB — POC URINE PREG, ED: Preg Test, Ur: NEGATIVE

## 2014-11-23 MED ORDER — HYDROCODONE-ACETAMINOPHEN 5-325 MG PO TABS
1.0000 | ORAL_TABLET | Freq: Once | ORAL | Status: AC
  Administered 2014-11-23: 1 via ORAL
  Filled 2014-11-23: qty 1
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
## 2014-11-24 MED ORDER — OXYCODONE-ACETAMINOPHEN 5-325 MG PO TABS: 1.0000 | ORAL_TABLET | ORAL | Status: DC | PRN

## 2014-11-24 MED ORDER — OXYCODONE-ACETAMINOPHEN 5-325 MG PO TABS
1.0000 | ORAL_TABLET | Freq: Once | ORAL | Status: AC
  Administered 2014-11-24: 1 via ORAL
  Filled 2014-11-24: qty 1

## 2014-11-24 MED ORDER — CYCLOBENZAPRINE HCL 5 MG PO TABS: 5.0000 mg | ORAL_TABLET | Freq: Three times a day (TID) | ORAL | Status: DC | PRN

## 2014-11-24 MED ORDER — IBUPROFEN 800 MG PO TABS: 800.0000 mg | ORAL_TABLET | Freq: Three times a day (TID) | ORAL | Status: DC
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
## 2014-11-25 NOTE — Telephone Encounter (Signed)
Samples of Amitiza given to patient x 1 box

## 2014-11-27 ENCOUNTER — Ambulatory Visit (INDEPENDENT_AMBULATORY_CARE_PROVIDER_SITE_OTHER): Payer: BLUE CROSS/BLUE SHIELD | Admitting: Internal Medicine

## 2014-12-03 ENCOUNTER — Other Ambulatory Visit: Payer: Self-pay | Admitting: Neurosurgery

## 2014-12-10 ENCOUNTER — Encounter: Payer: Self-pay | Admitting: *Deleted

## 2014-12-10 ENCOUNTER — Ambulatory Visit (INDEPENDENT_AMBULATORY_CARE_PROVIDER_SITE_OTHER): Payer: BLUE CROSS/BLUE SHIELD | Admitting: *Deleted

## 2014-12-10 DIAGNOSIS — Z3202 Encounter for pregnancy test, result negative: Secondary | ICD-10-CM | POA: Diagnosis not present

## 2014-12-10 DIAGNOSIS — Z3042 Encounter for surveillance of injectable contraceptive: Secondary | ICD-10-CM | POA: Diagnosis not present

## 2014-12-10 LAB — POCT URINE PREGNANCY: Preg Test, Ur: NEGATIVE

## 2014-12-10 MED ORDER — MEDROXYPROGESTERONE ACETATE 150 MG/ML IM SUSP
150.0000 mg | Freq: Once | INTRAMUSCULAR | Status: AC
  Administered 2014-12-10: 150 mg via INTRAMUSCULAR

## 2014-12-10 NOTE — Progress Notes (Signed)
Pt here for Depo. Pt reports having some bleeding. I advised to give it until Friday to see if the shot stops it. Call Friday if still bleeding. Pt voiced understanding. Return in 12 weeks for next shot. [place]

## 2014-12-15 ENCOUNTER — Other Ambulatory Visit (HOSPITAL_COMMUNITY): Payer: Self-pay | Admitting: *Deleted

## 2014-12-16 ENCOUNTER — Encounter (HOSPITAL_COMMUNITY): Payer: Self-pay

## 2014-12-16 ENCOUNTER — Encounter (HOSPITAL_COMMUNITY)
Admission: RE | Admit: 2014-12-16 | Discharge: 2014-12-16 | Disposition: A | Discharge status: Home / Self Care | Payer: BLUE CROSS/BLUE SHIELD | Source: Ambulatory Visit | Attending: Neurosurgery | Admitting: Neurosurgery

## 2014-12-16 DIAGNOSIS — J449 Chronic obstructive pulmonary disease, unspecified: Secondary | ICD-10-CM | POA: Diagnosis not present

## 2014-12-16 DIAGNOSIS — Z6833 Body mass index (BMI) 33.0-33.9, adult: Secondary | ICD-10-CM | POA: Diagnosis not present

## 2014-12-16 DIAGNOSIS — Z885 Allergy status to narcotic agent status: Secondary | ICD-10-CM | POA: Diagnosis not present

## 2014-12-16 DIAGNOSIS — Z79899 Other long term (current) drug therapy: Secondary | ICD-10-CM | POA: Diagnosis not present

## 2014-12-16 DIAGNOSIS — F603 Borderline personality disorder: Secondary | ICD-10-CM | POA: Diagnosis not present

## 2014-12-16 DIAGNOSIS — F419 Anxiety disorder, unspecified: Secondary | ICD-10-CM | POA: Diagnosis not present

## 2014-12-16 DIAGNOSIS — L309 Dermatitis, unspecified: Secondary | ICD-10-CM | POA: Diagnosis not present

## 2014-12-16 DIAGNOSIS — F17210 Nicotine dependence, cigarettes, uncomplicated: Secondary | ICD-10-CM | POA: Diagnosis not present

## 2014-12-16 DIAGNOSIS — M5410 Radiculopathy, site unspecified: Secondary | ICD-10-CM | POA: Diagnosis not present

## 2014-12-16 DIAGNOSIS — F909 Attention-deficit hyperactivity disorder, unspecified type: Secondary | ICD-10-CM | POA: Diagnosis not present

## 2014-12-16 DIAGNOSIS — K648 Other hemorrhoids: Secondary | ICD-10-CM | POA: Diagnosis not present

## 2014-12-16 DIAGNOSIS — F410 Panic disorder [episodic paroxysmal anxiety] without agoraphobia: Secondary | ICD-10-CM | POA: Diagnosis not present

## 2014-12-16 DIAGNOSIS — Z86010 Personal history of colonic polyps: Secondary | ICD-10-CM | POA: Diagnosis not present

## 2014-12-16 DIAGNOSIS — K5900 Constipation, unspecified: Secondary | ICD-10-CM | POA: Diagnosis not present

## 2014-12-16 DIAGNOSIS — Z91030 Bee allergy status: Secondary | ICD-10-CM | POA: Diagnosis not present

## 2014-12-16 DIAGNOSIS — Z91040 Latex allergy status: Secondary | ICD-10-CM | POA: Diagnosis not present

## 2014-12-16 DIAGNOSIS — I10 Essential (primary) hypertension: Secondary | ICD-10-CM | POA: Diagnosis not present

## 2014-12-16 DIAGNOSIS — J45909 Unspecified asthma, uncomplicated: Secondary | ICD-10-CM | POA: Diagnosis not present

## 2014-12-16 DIAGNOSIS — K589 Irritable bowel syndrome without diarrhea: Secondary | ICD-10-CM | POA: Diagnosis not present

## 2014-12-16 DIAGNOSIS — F319 Bipolar disorder, unspecified: Secondary | ICD-10-CM | POA: Diagnosis not present

## 2014-12-16 DIAGNOSIS — G43909 Migraine, unspecified, not intractable, without status migrainosus: Secondary | ICD-10-CM | POA: Diagnosis not present

## 2014-12-16 DIAGNOSIS — K219 Gastro-esophageal reflux disease without esophagitis: Secondary | ICD-10-CM | POA: Diagnosis not present

## 2014-12-16 HISTORY — DX: Unspecified osteoarthritis, unspecified site: M19.90

## 2014-12-16 LAB — CBC
HCT: 43 % (ref 36.0–46.0)
Hemoglobin: 15.4 g/dL — ABNORMAL HIGH (ref 12.0–15.0)
MCH: 32.2 pg (ref 26.0–34.0)
MCHC: 35.8 g/dL (ref 30.0–36.0)
MCV: 90 fL (ref 78.0–100.0)
Platelets: 339 10*3/uL (ref 150–400)
RBC: 4.78 MIL/uL (ref 3.87–5.11)
RDW: 13.5 % (ref 11.5–15.5)
WBC: 9.7 10*3/uL (ref 4.0–10.5)

## 2014-12-16 LAB — BASIC METABOLIC PANEL
Anion gap: 9 (ref 5–15)
BUN: 6 mg/dL (ref 6–20)
CHLORIDE: 104 mmol/L (ref 101–111)
CO2: 23 mmol/L (ref 22–32)
CREATININE: 0.98 mg/dL (ref 0.44–1.00)
Calcium: 9.4 mg/dL (ref 8.9–10.3)
GFR calc Af Amer: >60 mL/min (ref >60)
GFR calc non Af Amer: >60 mL/min (ref >60)
Glucose, Bld: 90 mg/dL (ref 65–99)
POTASSIUM: 4 mmol/L (ref 3.5–5.1)
Sodium: 136 mmol/L (ref 135–145)

## 2014-12-16 LAB — SURGICAL PCR SCREEN
MRSA, PCR: NEGATIVE
Staphylococcus aureus: NEGATIVE

## 2014-12-16 MED ORDER — CEFAZOLIN SODIUM-DEXTROSE 2-3 GM-% IV SOLR
2.0000 g | INTRAVENOUS | Status: AC
  Administered 2014-12-17: 2 g via INTRAVENOUS
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
## 2014-12-16 NOTE — Progress Notes (Signed)
Pharm. Tech into see pt. - med. List very lengthy.

## 2014-12-17 ENCOUNTER — Encounter (HOSPITAL_COMMUNITY): Payer: Self-pay

## 2014-12-17 ENCOUNTER — Ambulatory Visit (HOSPITAL_COMMUNITY): Payer: BLUE CROSS/BLUE SHIELD | Admitting: Anesthesiology

## 2014-12-17 ENCOUNTER — Ambulatory Visit (HOSPITAL_COMMUNITY)
Admission: RE | Admit: 2014-12-17 | Discharge: 2014-12-18 | Disposition: A | Discharge status: Home / Self Care | Payer: BLUE CROSS/BLUE SHIELD | Source: Ambulatory Visit | Attending: Neurosurgery | Admitting: Neurosurgery

## 2014-12-17 ENCOUNTER — Encounter (HOSPITAL_COMMUNITY)
Admission: RE | Disposition: A | Discharge status: Home / Self Care | Payer: Self-pay | Source: Ambulatory Visit | Attending: Neurosurgery

## 2014-12-17 DIAGNOSIS — K648 Other hemorrhoids: Secondary | ICD-10-CM | POA: Insufficient documentation

## 2014-12-17 DIAGNOSIS — I10 Essential (primary) hypertension: Secondary | ICD-10-CM | POA: Insufficient documentation

## 2014-12-17 DIAGNOSIS — J45909 Unspecified asthma, uncomplicated: Secondary | ICD-10-CM | POA: Insufficient documentation

## 2014-12-17 DIAGNOSIS — G43909 Migraine, unspecified, not intractable, without status migrainosus: Secondary | ICD-10-CM | POA: Insufficient documentation

## 2014-12-17 DIAGNOSIS — K5900 Constipation, unspecified: Secondary | ICD-10-CM | POA: Insufficient documentation

## 2014-12-17 DIAGNOSIS — K589 Irritable bowel syndrome without diarrhea: Secondary | ICD-10-CM | POA: Insufficient documentation

## 2014-12-17 DIAGNOSIS — M5410 Radiculopathy, site unspecified: Secondary | ICD-10-CM | POA: Insufficient documentation

## 2014-12-17 DIAGNOSIS — Z91030 Bee allergy status: Secondary | ICD-10-CM | POA: Insufficient documentation

## 2014-12-17 DIAGNOSIS — L309 Dermatitis, unspecified: Secondary | ICD-10-CM | POA: Insufficient documentation

## 2014-12-17 DIAGNOSIS — F410 Panic disorder [episodic paroxysmal anxiety] without agoraphobia: Secondary | ICD-10-CM | POA: Insufficient documentation

## 2014-12-17 DIAGNOSIS — Z6833 Body mass index (BMI) 33.0-33.9, adult: Secondary | ICD-10-CM | POA: Insufficient documentation

## 2014-12-17 DIAGNOSIS — F319 Bipolar disorder, unspecified: Secondary | ICD-10-CM | POA: Insufficient documentation

## 2014-12-17 DIAGNOSIS — Z86010 Personal history of colonic polyps: Secondary | ICD-10-CM | POA: Insufficient documentation

## 2014-12-17 DIAGNOSIS — K219 Gastro-esophageal reflux disease without esophagitis: Secondary | ICD-10-CM | POA: Insufficient documentation

## 2014-12-17 DIAGNOSIS — F603 Borderline personality disorder: Secondary | ICD-10-CM | POA: Insufficient documentation

## 2014-12-17 DIAGNOSIS — Z91040 Latex allergy status: Secondary | ICD-10-CM | POA: Insufficient documentation

## 2014-12-17 DIAGNOSIS — R52 Pain, unspecified: Secondary | ICD-10-CM | POA: Diagnosis present

## 2014-12-17 DIAGNOSIS — F17210 Nicotine dependence, cigarettes, uncomplicated: Secondary | ICD-10-CM | POA: Insufficient documentation

## 2014-12-17 DIAGNOSIS — Z885 Allergy status to narcotic agent status: Secondary | ICD-10-CM | POA: Insufficient documentation

## 2014-12-17 DIAGNOSIS — F419 Anxiety disorder, unspecified: Secondary | ICD-10-CM | POA: Insufficient documentation

## 2014-12-17 DIAGNOSIS — J449 Chronic obstructive pulmonary disease, unspecified: Secondary | ICD-10-CM | POA: Insufficient documentation

## 2014-12-17 DIAGNOSIS — F909 Attention-deficit hyperactivity disorder, unspecified type: Secondary | ICD-10-CM | POA: Insufficient documentation

## 2014-12-17 DIAGNOSIS — Z79899 Other long term (current) drug therapy: Secondary | ICD-10-CM | POA: Insufficient documentation

## 2014-12-17 HISTORY — PX: SPINAL CORD STIMULATOR REMOVAL: SHX5379

## 2014-12-17 LAB — URINALYSIS, ROUTINE W REFLEX MICROSCOPIC
BILIRUBIN URINE: NEGATIVE
Glucose, UA: NEGATIVE mg/dL
Hgb urine dipstick: NEGATIVE
Ketones, ur: NEGATIVE mg/dL
Nitrite: NEGATIVE
Protein, ur: NEGATIVE mg/dL
SPECIFIC GRAVITY, URINE: 1.015 (ref 1.005–1.030)
Urobilinogen, UA: 0.2 mg/dL (ref 0.0–1.0)
pH: 5.5 (ref 5.0–8.0)

## 2014-12-17 LAB — URINE MICROSCOPIC-ADD ON

## 2014-12-17 SURGERY — LUMBAR SPINAL CORD STIMULATOR REMOVAL
Anesthesia: General

## 2014-12-17 MED ORDER — FLUTICASONE PROPIONATE 50 MCG/ACT NA SUSP
2.0000 | Freq: Every day | NASAL | Status: DC
  Administered 2014-12-17: 2 via NASAL
  Filled 2014-12-17: qty 16

## 2014-12-17 MED ORDER — 0.9 % SODIUM CHLORIDE (POUR BTL) OPTIME
TOPICAL | Status: DC | PRN
  Administered 2014-12-17: 1000 mL

## 2014-12-17 MED ORDER — CYCLOBENZAPRINE HCL 5 MG PO TABS: 5.0000 mg | ORAL_TABLET | Freq: Three times a day (TID) | ORAL | Status: DC | PRN

## 2014-12-17 MED ORDER — GLYCOPYRROLATE 0.2 MG/ML IJ SOLN
INTRAMUSCULAR | Status: DC | PRN
  Administered 2014-12-17: .6 mg via INTRAVENOUS

## 2014-12-17 MED ORDER — SODIUM CHLORIDE 0.9 % IJ SOLN: 3.0000 mL | Freq: Two times a day (BID) | INTRAMUSCULAR | Status: DC

## 2014-12-17 MED ORDER — NYSTATIN 100000 UNIT/GM EX POWD: 1.0000 g | CUTANEOUS | Status: DC

## 2014-12-17 MED ORDER — FENTANYL CITRATE (PF) 250 MCG/5ML IJ SOLN
INTRAMUSCULAR | Status: AC
  Filled 2014-12-17: qty 5

## 2014-12-17 MED ORDER — ALCLOMETASONE DIPROPIONATE 0.05 % EX CREA: 1.0000 "application " | TOPICAL_CREAM | Freq: Two times a day (BID) | CUTANEOUS | Status: DC | PRN

## 2014-12-17 MED ORDER — ONDANSETRON HCL 4 MG/2ML IJ SOLN
INTRAMUSCULAR | Status: DC | PRN
  Administered 2014-12-17: 4 mg via INTRAVENOUS

## 2014-12-17 MED ORDER — PANTOPRAZOLE SODIUM 40 MG PO TBEC
80.0000 mg | DELAYED_RELEASE_TABLET | Freq: Every day | ORAL | Status: DC
  Administered 2014-12-18: 80 mg via ORAL
  Filled 2014-12-17: qty 2

## 2014-12-17 MED ORDER — MORPHINE SULFATE 2 MG/ML IJ SOLN
INTRAMUSCULAR | Status: AC
  Filled 2014-12-17: qty 1

## 2014-12-17 MED ORDER — NEOSTIGMINE METHYLSULFATE 10 MG/10ML IV SOLN
INTRAVENOUS | Status: DC | PRN
  Administered 2014-12-17: 5 mg via INTRAVENOUS

## 2014-12-17 MED ORDER — MIDAZOLAM HCL 2 MG/2ML IJ SOLN: 0.5000 mg | Freq: Once | INTRAMUSCULAR | Status: DC | PRN

## 2014-12-17 MED ORDER — MORPHINE SULFATE 2 MG/ML IJ SOLN
1.0000 mg | INTRAMUSCULAR | Status: DC | PRN
  Administered 2014-12-17 (×3): 2 mg via INTRAVENOUS

## 2014-12-17 MED ORDER — SODIUM CHLORIDE 0.9 % IJ SOLN: 3.0000 mL | INTRAMUSCULAR | Status: DC | PRN

## 2014-12-17 MED ORDER — CLONIDINE HCL 0.1 MG PO TABS
0.1000 mg | ORAL_TABLET | Freq: Two times a day (BID) | ORAL | Status: DC
  Filled 2014-12-17 (×3): qty 1

## 2014-12-17 MED ORDER — LIDOCAINE-EPINEPHRINE 0.5 %-1:200000 IJ SOLN
INTRAMUSCULAR | Status: DC | PRN
  Administered 2014-12-17: 30 mL

## 2014-12-17 MED ORDER — PROMETHAZINE HCL 25 MG PO TABS
25.0000 mg | ORAL_TABLET | Freq: Four times a day (QID) | ORAL | Status: DC | PRN
Start: 2014-12-17 — End: 2014-12-18
  Administered 2014-12-18: 25 mg via ORAL
  Filled 2014-12-17: qty 1

## 2014-12-17 MED ORDER — ONDANSETRON HCL 4 MG/2ML IJ SOLN: 4.0000 mg | INTRAMUSCULAR | Status: DC | PRN

## 2014-12-17 MED ORDER — LUBIPROSTONE 24 MCG PO CAPS
24.0000 ug | ORAL_CAPSULE | Freq: Two times a day (BID) | ORAL | Status: DC
  Administered 2014-12-17 – 2014-12-18 (×2): 24 ug via ORAL
  Filled 2014-12-17 (×4): qty 1

## 2014-12-17 MED ORDER — PROPRANOLOL HCL 10 MG PO TABS
10.0000 mg | ORAL_TABLET | Freq: Three times a day (TID) | ORAL | Status: DC
  Administered 2014-12-17: 10 mg via ORAL
  Filled 2014-12-17 (×4): qty 1

## 2014-12-17 MED ORDER — ACETAMINOPHEN 650 MG RE SUPP: 650.0000 mg | RECTAL | Status: DC | PRN

## 2014-12-17 MED ORDER — ALBUTEROL SULFATE (2.5 MG/3ML) 0.083% IN NEBU: 2.5000 mg | INHALATION_SOLUTION | RESPIRATORY_TRACT | Status: DC | PRN

## 2014-12-17 MED ORDER — UREA 10 % EX LOTN
1.0000 "application " | TOPICAL_LOTION | Freq: Two times a day (BID) | CUTANEOUS | Status: DC
  Administered 2014-12-17: 1 via TOPICAL
  Filled 2014-12-17: qty 177

## 2014-12-17 MED ORDER — ACETAMINOPHEN 325 MG PO TABS: 650.0000 mg | ORAL_TABLET | ORAL | Status: DC | PRN

## 2014-12-17 MED ORDER — MENTHOL 3 MG MT LOZG: 1.0000 | LOZENGE | OROMUCOSAL | Status: DC | PRN

## 2014-12-17 MED ORDER — ARIPIPRAZOLE 9.75 MG/1.3ML IM SOLN: 400.0000 mg | INTRAMUSCULAR | Status: DC

## 2014-12-17 MED ORDER — LIDOCAINE HCL (CARDIAC) 20 MG/ML IV SOLN
INTRAVENOUS | Status: DC | PRN
  Administered 2014-12-17: 20 mg via INTRAVENOUS
  Administered 2014-12-17: 60 mg via INTRAVENOUS

## 2014-12-17 MED ORDER — ALPRAZOLAM 0.5 MG PO TABS
2.0000 mg | ORAL_TABLET | Freq: Three times a day (TID) | ORAL | Status: DC | PRN
Start: 2014-12-17 — End: 2014-12-18
  Administered 2014-12-17 – 2014-12-18 (×2): 2 mg via ORAL
  Filled 2014-12-17 (×2): qty 4

## 2014-12-17 MED ORDER — ZOLPIDEM TARTRATE 5 MG PO TABS
5.0000 mg | ORAL_TABLET | Freq: Every evening | ORAL | Status: DC | PRN
Start: 2014-12-17 — End: 2014-12-18
  Administered 2014-12-17: 5 mg via ORAL
  Filled 2014-12-17: qty 1

## 2014-12-17 MED ORDER — OXYCODONE-ACETAMINOPHEN 5-325 MG PO TABS
1.0000 | ORAL_TABLET | Freq: Four times a day (QID) | ORAL | Status: DC | PRN
  Administered 2014-12-17 – 2014-12-18 (×3): 1 via ORAL
  Filled 2014-12-17 (×3): qty 1

## 2014-12-17 MED ORDER — MIDAZOLAM HCL 2 MG/2ML IJ SOLN
INTRAMUSCULAR | Status: AC
  Filled 2014-12-17: qty 2

## 2014-12-17 MED ORDER — LACTATED RINGERS IV SOLN
INTRAVENOUS | Status: DC | PRN
  Administered 2014-12-17: 14:00:00 via INTRAVENOUS

## 2014-12-17 MED ORDER — PROPOFOL 10 MG/ML IV BOLUS
INTRAVENOUS | Status: DC | PRN
  Administered 2014-12-17: 200 mg via INTRAVENOUS

## 2014-12-17 MED ORDER — BUDESONIDE 0.5 MG/2ML IN SUSP
0.5000 mg | Freq: Two times a day (BID) | RESPIRATORY_TRACT | Status: DC
  Administered 2014-12-17 – 2014-12-18 (×2): 0.5 mg via RESPIRATORY_TRACT
  Filled 2014-12-17 (×4): qty 2

## 2014-12-17 MED ORDER — MIDAZOLAM HCL 5 MG/5ML IJ SOLN
INTRAMUSCULAR | Status: DC | PRN
  Administered 2014-12-17: 2 mg via INTRAVENOUS

## 2014-12-17 MED ORDER — SILVER SULFADIAZINE 1 % EX CREA
1.0000 "application " | TOPICAL_CREAM | Freq: Every day | CUTANEOUS | Status: DC
  Administered 2014-12-17: 1 via TOPICAL
  Filled 2014-12-17: qty 85

## 2014-12-17 MED ORDER — SODIUM CHLORIDE 0.9 % IV SOLN: 250.0000 mL | INTRAVENOUS | Status: DC

## 2014-12-17 MED ORDER — METAXALONE 800 MG PO TABS: 800.0000 mg | ORAL_TABLET | Freq: Four times a day (QID) | ORAL | Status: DC | PRN

## 2014-12-17 MED ORDER — DICLOFENAC POTASSIUM(MIGRAINE) 50 MG PO PACK: 50.0000 mg | PACK | Freq: Every day | ORAL | Status: DC | PRN

## 2014-12-17 MED ORDER — FENTANYL CITRATE (PF) 100 MCG/2ML IJ SOLN
INTRAMUSCULAR | Status: DC | PRN
  Administered 2014-12-17: 250 ug via INTRAVENOUS

## 2014-12-17 MED ORDER — DOXEPIN HCL 50 MG PO CAPS
50.0000 mg | ORAL_CAPSULE | Freq: Three times a day (TID) | ORAL | Status: DC
  Administered 2014-12-17: 50 mg via ORAL
  Filled 2014-12-17 (×4): qty 1

## 2014-12-17 MED ORDER — POTASSIUM CHLORIDE IN NACL 20-0.9 MEQ/L-% IV SOLN
INTRAVENOUS | Status: DC
  Filled 2014-12-17 (×3): qty 1000

## 2014-12-17 MED ORDER — MEPERIDINE HCL 25 MG/ML IJ SOLN: 6.2500 mg | INTRAMUSCULAR | Status: DC | PRN

## 2014-12-17 MED ORDER — ROCURONIUM BROMIDE 100 MG/10ML IV SOLN
INTRAVENOUS | Status: DC | PRN
  Administered 2014-12-17: 50 mg via INTRAVENOUS

## 2014-12-17 MED ORDER — GEMFIBROZIL 600 MG PO TABS
600.0000 mg | ORAL_TABLET | Freq: Two times a day (BID) | ORAL | Status: DC
  Administered 2014-12-17 – 2014-12-18 (×2): 600 mg via ORAL
  Filled 2014-12-17 (×4): qty 1

## 2014-12-17 MED ORDER — CYCLOBENZAPRINE HCL 5 MG PO TABS
5.0000 mg | ORAL_TABLET | Freq: Three times a day (TID) | ORAL | Status: DC | PRN
  Administered 2014-12-18: 5 mg via ORAL
  Filled 2014-12-17: qty 1

## 2014-12-17 MED ORDER — BUDESONIDE 0.25 MG/2ML IN SUSP: 0.2500 mg | Freq: Two times a day (BID) | RESPIRATORY_TRACT | Status: DC

## 2014-12-17 MED ORDER — OLOPATADINE HCL 0.1 % OP SOLN: 1.0000 [drp] | Freq: Two times a day (BID) | OPHTHALMIC | Status: DC

## 2014-12-17 MED ORDER — PIMECROLIMUS 1 % EX CREA: 1.0000 "application " | TOPICAL_CREAM | Freq: Two times a day (BID) | CUTANEOUS | Status: DC | PRN

## 2014-12-17 MED ORDER — PHENOL 1.4 % MT LIQD: 1.0000 | OROMUCOSAL | Status: DC | PRN

## 2014-12-17 MED ORDER — KETOROLAC TROMETHAMINE 30 MG/ML IJ SOLN
30.0000 mg | Freq: Four times a day (QID) | INTRAMUSCULAR | Status: DC
  Administered 2014-12-17 – 2014-12-18 (×2): 30 mg via INTRAVENOUS
  Filled 2014-12-17 (×6): qty 1

## 2014-12-17 MED ORDER — PROMETHAZINE HCL 25 MG/ML IJ SOLN: 6.2500 mg | INTRAMUSCULAR | Status: DC | PRN

## 2014-12-17 MED ORDER — DICLOFENAC SODIUM 50 MG PO TBEC
50.0000 mg | DELAYED_RELEASE_TABLET | Freq: Every day | ORAL | Status: DC | PRN
  Filled 2014-12-17: qty 1

## 2014-12-17 SURGICAL SUPPLY — 46 items
ADH SKN CLS APL DERMABOND .7 (GAUZE/BANDAGES/DRESSINGS) ×2
BLADE CLIPPER SURG (BLADE) IMPLANT
BUR MATCHSTICK NEURO 3.0 LAGG (BURR) IMPLANT
CONT SPEC 4OZ CLIKSEAL STRL BL (MISCELLANEOUS) IMPLANT
DECANTER SPIKE VIAL GLASS SM (MISCELLANEOUS) ×2 IMPLANT
DERMABOND ADVANCED (GAUZE/BANDAGES/DRESSINGS) ×2
DERMABOND ADVANCED .7 DNX12 (GAUZE/BANDAGES/DRESSINGS) IMPLANT
DRAPE LAPAROTOMY T 102X78X121 (DRAPES) ×3 IMPLANT
DRAPE POUCH INSTRU U-SHP 10X18 (DRAPES) ×1 IMPLANT
DURAPREP 26ML APPLICATOR (WOUND CARE) ×2 IMPLANT
ELECT CAUTERY BLADE 6.4 (BLADE) ×2 IMPLANT
GLOVE ECLIPSE 6.5 STRL STRAW (GLOVE) ×1 IMPLANT
GLOVE EXAM NITRILE LRG STRL (GLOVE) IMPLANT
GLOVE EXAM NITRILE MD LF STRL (GLOVE) IMPLANT
GLOVE EXAM NITRILE XL STR (GLOVE) IMPLANT
GLOVE EXAM NITRILE XS STR PU (GLOVE) IMPLANT
GLOVE INDICATOR 7.5 STRL GRN (GLOVE) ×2 IMPLANT
GLOVE SURG SS PI 7.0 STRL IVOR (GLOVE) ×3 IMPLANT
GOWN STRL REUS W/ TWL LRG LVL3 (GOWN DISPOSABLE) ×2 IMPLANT
GOWN STRL REUS W/ TWL XL LVL3 (GOWN DISPOSABLE) IMPLANT
GOWN STRL REUS W/TWL 2XL LVL3 (GOWN DISPOSABLE) IMPLANT
GOWN STRL REUS W/TWL LRG LVL3 (GOWN DISPOSABLE) ×2
GOWN STRL REUS W/TWL XL LVL3 (GOWN DISPOSABLE) ×2
KIT BASIN OR (CUSTOM PROCEDURE TRAY) ×2 IMPLANT
KIT NEUROSTIMULATOR ACCESSORY (KITS) ×1 IMPLANT
KIT ROOM TURNOVER OR (KITS) ×2 IMPLANT
LIQUID BAND (GAUZE/BANDAGES/DRESSINGS) ×1 IMPLANT
NDL HYPO 25X1 1.5 SAFETY (NEEDLE) ×1 IMPLANT
NDL SPNL 22GX3.5 QUINCKE BK (NEEDLE) IMPLANT
NEEDLE HYPO 25X1 1.5 SAFETY (NEEDLE) IMPLANT
NEEDLE SPNL 22GX3.5 QUINCKE BK (NEEDLE) IMPLANT
NS IRRIG 1000ML POUR BTL (IV SOLUTION) ×2 IMPLANT
PACK LAMINECTOMY NEURO (CUSTOM PROCEDURE TRAY) ×2 IMPLANT
PAD ARMBOARD 7.5X6 YLW CONV (MISCELLANEOUS) ×6 IMPLANT
SPONGE SURGIFOAM ABS GEL SZ50 (HEMOSTASIS) ×1 IMPLANT
SUT SILK 2 0 TIES 10X30 (SUTURE) ×1 IMPLANT
SUT VIC AB 0 CT1 18XCR BRD8 (SUTURE) ×1 IMPLANT
SUT VIC AB 0 CT1 8-18 (SUTURE) ×2
SUT VIC AB 2-0 CT1 18 (SUTURE) ×3 IMPLANT
SUT VIC AB 3-0 SH 8-18 (SUTURE) ×4 IMPLANT
SYR 20ML ECCENTRIC (SYRINGE) ×1 IMPLANT
SYR BULB 3OZ (MISCELLANEOUS) ×1 IMPLANT
SYR CONTROL 10ML LL (SYRINGE) ×1 IMPLANT
TOWEL OR 17X24 6PK STRL BLUE (TOWEL DISPOSABLE) ×2 IMPLANT
TOWEL OR 17X26 10 PK STRL BLUE (TOWEL DISPOSABLE) ×2 IMPLANT
WATER STERILE IRR 1000ML POUR (IV SOLUTION) ×2 IMPLANT

## 2014-12-17 NOTE — Anesthesia Postprocedure Evaluation (Signed)
  Anesthesia Post-op Note  Patient: Sharon Vazquez  Procedure(s) Performed: Procedure(s) with comments: THORACIC SPINAL CORD STIMULATOR REMOVAL (N/A) - THORACIC SPINAL CORD STIMULATOR REMOVAL  Patient Location: PACU  Anesthesia Type:General  Level of Consciousness: awake, alert , oriented and patient cooperative  Airway and Oxygen Therapy: Patient Spontanous Breathing  Post-op Pain: none  Post-op Assessment: Post-op Vital signs reviewed, Patient's Cardiovascular Status Stable, Respiratory Function Stable, Patent Airway, No signs of Nausea or vomiting and Pain level controlled              Post-op Vital Signs: Reviewed and stable  Last Vitals:  Filed Vitals:   12/17/14 1630  BP:   Pulse: 68  Temp:   Resp: 19    Complications: No apparent anesthesia complications

## 2014-12-17 NOTE — Anesthesia Preprocedure Evaluation (Signed)
Anesthesia Evaluation  Patient identified by MRN, date of birth, ID band Patient awake    Reviewed: Allergy & Precautions, NPO status , Patient's Chart, lab work & pertinent test results  History of Anesthesia Complications Negative for: history of anesthetic complications  Airway Mallampati: I  TM Distance: >3 FB Neck ROM: Full    Dental  (+) Poor Dentition, Dental Advisory Given   Pulmonary asthma , COPD COPD inhaler, Current Smoker,  breath sounds clear to auscultation        Cardiovascular hypertension, Pt. on medications and Pt. on home beta blockers - anginaRhythm:Regular Rate:Normal     Neuro/Psych Anxiety Depression Bipolar Disorder Chronic back pain    GI/Hepatic Neg liver ROS, GERD-  Medicated and Controlled,  Endo/Other  Morbid obesity  Renal/GU negative Renal ROS     Musculoskeletal  (+) Arthritis -, Osteoarthritis,    Abdominal (+) + obese,   Peds  Hematology negative hematology ROS (+)   Anesthesia Other Findings   Reproductive/Obstetrics Depo, LMP 1 week ago                             Anesthesia Physical Anesthesia Plan  ASA: III  Anesthesia Plan: General   Post-op Pain Management:    Induction: Intravenous  Airway Management Planned: Oral ETT  Additional Equipment:   Intra-op Plan:   Post-operative Plan: Extubation in OR  Informed Consent: I have reviewed the patients History and Physical, chart, labs and discussed the procedure including the risks, benefits and alternatives for the proposed anesthesia with the patient or authorized representative who has indicated his/her understanding and acceptance.   Dental advisory given  Plan Discussed with: CRNA and Surgeon  Anesthesia Plan Comments: (Plan routine monitors, GETA)        Anesthesia Quick Evaluation

## 2014-12-17 NOTE — Anesthesia Procedure Notes (Addendum)
Procedure Name: Intubation Date/Time: 12/17/2014 2:33 PM Performed by: Neldon Newport Pre-anesthesia Checklist: Patient being monitored, Suction available, Emergency Drugs available, Patient identified and Timeout performed Patient Re-evaluated:Patient Re-evaluated prior to inductionOxygen Delivery Method: Circle system utilized Preoxygenation: Pre-oxygenation with 100% oxygen Intubation Type: IV induction Ventilation: Mask ventilation without difficulty Laryngoscope Size: Mac and 3 Grade View: Grade I Tube type: Oral (performed by [place] Samsuddin) Tube size: 7.0 mm Number of attempts: 1 Placement Confirmation: positive ETCO2,  ETT inserted through vocal cords under direct vision and breath sounds checked- equal and bilateral Secured at: 22 cm Tube secured with: Tape Dental Injury: Teeth and Oropharynx as per pre-operative assessment

## 2014-12-17 NOTE — H&P (Signed)
BP 121/71 mmHg  Pulse 91  Temp(Src) 98.9 F (37.2 C) (Oral)  Resp 20  Ht 5\' 6"  (1.676 m)  Wt 93.985 kg (207 lb 3.2 oz)  BMI 33.46 kg/m2  SpO2 100% Cc: Chronic intractable pain Sharon Vazquez presents today for spinal cord stimulator removal. She had the stimulator placed and according to her it has been ineffective. She has had no neurologic problems. Allergies  Allergen Reactions  . Bee Venom Anaphylaxis  . Dilaudid [Hydromorphone Hcl] Anaphylaxis and Hives  . Latex Hives  . Senna Anaphylaxis and Hives  . Adhesive [Tape] Hives and Other (See Comments)    Pulls skin off (use paper tape)   Prior to Admission medications   Medication Sig Start Date End Date Taking? Authorizing Provider  albuterol (PROVENTIL HFA;VENTOLIN HFA) 108 (90 BASE) MCG/ACT inhaler Inhale 2 puffs into the lungs every 4 (four) hours as needed for wheezing or shortness of breath.    Yes Historical Provider, MD  alprazolam Duanne Moron) 2 MG tablet Take 2 mg by mouth 3 (three) times daily as needed for anxiety (panic attacks).   Yes Historical Provider, MD  ALUVEA 39 % CREA Apply 1 application topically 2 (two) times daily.  03/03/14  Yes Historical Provider, MD  ARIPiprazole (ABILIFY) 9.75 MG/1.3ML injection Inject 400 mg into the muscle every 30 (thirty) days. 20th of the month   Yes Historical Provider, MD  [facility] HFA 200 MCG/ACT AERO Inhale 1 puff into the lungs daily.  05/15/14  Yes Historical Provider, MD  beclomethasone (QVAR) 80 MCG/ACT inhaler Inhale 2 puffs into the lungs as needed (shortness of breath).    Yes Historical Provider, MD  cloNIDine (CATAPRES) 0.1 MG tablet Take 0.1 mg by mouth 2 (two) times daily.    Yes Historical Provider, MD  cyclobenzaprine (FLEXERIL) 5 MG tablet Take 1 tablet (5 mg total) by mouth 3 (three) times daily as needed for muscle spasms. Patient taking differently: Take 5 mg by mouth 3 (three) times daily.  11/24/14  Yes Almyra Free Idol, PA-C  doxepin (SINEQUAN) 50 MG capsule Take  50 mg by mouth 3 (three) times daily.    Yes Historical Provider, MD  esomeprazole (NEXIUM) 40 MG capsule Take 1 capsule (40 mg total) by mouth daily at 12 noon. Patient taking differently: Take 40 mg by mouth daily.  03/04/14  Yes Rogene Houston, MD  gemfibrozil (LOPID) 600 MG tablet 600 mg 2 (two) times daily before a meal.  11/11/14  Yes Historical Provider, MD  lubiprostone (AMITIZA) 24 MCG capsule Take 1 capsule (24 mcg total) by mouth 2 (two) times daily with a meal. 03/04/14  Yes Rogene Houston, MD  medroxyPROGESTERone (DEPO-PROVERA) 150 MG/ML injection Inject 1 mL (150 mg total) into the muscle every 3 (three) months. 08/26/14  Yes Estill Dooms, NP  metaxalone (SKELAXIN) 800 MG tablet Take 800 mg by mouth every 6 (six) hours as needed for muscle spasms.   Yes Historical Provider, MD  NASONEX 50 MCG/ACT nasal spray Place 1 spray into both nostrils as needed (allergies).  05/13/14  Yes Historical Provider, MD  oxyCODONE-acetaminophen (PERCOCET/ROXICET) 5-325 MG per tablet Take 1 tablet by mouth every 4 (four) hours as needed. Patient taking differently: Take 1 tablet by mouth every 6 (six) hours as needed for moderate pain.  11/24/14  Yes Evalee Jefferson, PA-C  promethazine (PHENERGAN) 25 MG tablet Take 1 tablet (25 mg total) by mouth every 6 (six) hours as needed. Patient taking differently: Take 25 mg by mouth  every 6 (six) hours as needed for nausea.  08/07/14  Yes Fredia Sorrow, MD  propranolol (INDERAL) 10 MG tablet Take 10 mg by mouth 3 (three) times daily.   Yes Historical Provider, MD  silver sulfADIAZINE (SILVADENE) 1 % cream Apply 1 application topically daily as needed (infection). For 2 weeks   Yes Historical Provider, MD  zolpidem (AMBIEN CR) 12.5 MG CR tablet Take 12.5 mg by mouth at bedtime.    Yes Historical Provider, MD  alclomethasone (ACLOVATE) 0.05 % cream Apply 1 application topically 2 (two) times daily as needed (dermatitis).  05/13/14   Historical Provider, MD  Diclofenac  Potassium (CAMBIA) 50 MG PACK Take 50 mg by mouth daily as needed (migraines).    Historical Provider, MD  EPINEPHrine (EPIPEN) 0.3 mg/0.3 mL SOAJ injection Inject 0.3 mg into the muscle as needed (allergic reaction).    Historical Provider, MD  nystatin (MYCOSTATIN/NYSTOP) 100000 UNIT/GM POWD Apply 1 g topically See admin instructions. Apply two-three times a day to affected area as needed 08/26/14   Estill Dooms, NP  Olopatadine HCl (PATADAY) 0.2 % SOLN Place 1 drop into both eyes daily as needed (itchy eyes).    Historical Provider, MD  pimecrolimus (ELIDEL) 1 % cream Apply 1 application topically 2 (two) times daily as needed (facial rash).    Historical Provider, MD   Past Medical History  Diagnosis Date  . Hypertension   . Anxiety   . Bipolar 1 disorder   . ADHD (attention deficit hyperactivity disorder)   . Borderline personality disorder   . Panic attack   . Panic disorder   . HSV-2 (herpes simplex virus 2) infection   . Hx of chlamydia infection   . IBS (irritable bowel syndrome)   . Abnormal Pap smear   . Constipation     takes Dulcolax daily as needed takes Amitiza daily  . Anxiety     takes Xanax daily as needed  . Back spasm     takes Flexeril daily as needed  . Insomnia     takes Ambien nightly  . Hypertension     takes INderal and Clonidine daily  . Shortness of breath     with exertion  . Asthma     Ventolin as needed and QVAR takes daily  . History of bronchitis     last time 4-8yrs ago  . Weakness     numbness and tingling to left foot  . Joint swelling     right knee  . Chronic back pain     HNP  . Eczema     has 2 creams uses as needed  . GERD (gastroesophageal reflux disease)     takes Tums as needed  . Internal hemorrhoids   . History of colon polyps   . Urinary urgency   . Mental disorder     takes Abilify as needed  . Bipolar 1 disorder     takes DOxepin daily  . Obesity   . Contraceptive management 08/23/2013  . Irregular periods  04/02/2014  . Headache(784.0)   . Headache(784.0)     migraines-last one about 66months ago;takes Topamax daily  . Arthritis     degenerative spine   Past Surgical History  Procedure Laterality Date  . Wisdom tooth extraction    . Bunion removal    . Cholecystectomy  10 yrs ago  . Colonoscopy N/A 08/22/2012    Procedure: COLONOSCOPY;  Surgeon: Rogene Houston, MD;  Location: [facility];  Service: Endoscopy;  Laterality: N/A;  100  . Tonsillectomy    . Cholecystectomy  10+yrs ago  . Tonsillectomy      as a child  . Bunionectomy Left     pins in big toe and 2nd toe  . Wisdom teeth extracted    . Epidural injections      x 2  . Colonoscopy    . Lumbar laminectomy/decompression microdiscectomy Left 07/03/2013    Procedure: LEFT LUMBAR THREE-FOUR microdiskectomy;  Surgeon: Winfield Cunas, MD;  Location: [facility];  Service: Neurosurgery;  Laterality: Left;  LEFT LUMBAR THREE-FOUR microdiskectomy  . Lumbar laminectomy/decompression microdiscectomy Left 11/29/2013    Procedure: LEFT Lumbar Four-Five Redo microdiskectomy;  Surgeon: Winfield Cunas, MD;  Location: [facility];  Service: Neurosurgery;  Laterality: Left;  LEFT Lumbar Four-Five Redo microdiskectomy  . Back surgery    . Spinal cord stimulator trial N/A 04/23/2014    Procedure: LUMBAR SPINAL CORD STIMULATOR TRIAL;  Surgeon: Ashok Pall, MD;  Location: [facility];  Service: Neurosurgery;  Laterality: N/A;  Spinal Cord Stimulator Trial  . Spinal cord stimulator insertion N/A 06/13/2014    Procedure: LUMBAR SPINAL CORD STIMULATOR INSERTION;  Surgeon: Ashok Pall, MD;  Location: [facility];  Service: Neurosurgery;  Laterality: N/A;  permanent spinal cord stimulator insertion   Physical Exam  Constitutional: She is oriented to person, place, and time. She appears well-developed and well-nourished.  HENT:  Head: Normocephalic and atraumatic.  Right Ear: External ear normal.  Left Ear: External ear normal.  Eyes: EOM are  normal. Pupils are equal, round, and reactive to light.  Neck: Normal range of motion. Neck supple.  Cardiovascular: Normal rate and regular rhythm.   Pulmonary/Chest: Effort normal.  Abdominal: Soft. Bowel sounds are normal.  Musculoskeletal: Normal range of motion.  Neurological: She is alert and oriented to person, place, and time. She has normal reflexes.  Skin: Skin is warm and dry.  Psychiatric: She has a normal mood and affect. Her behavior is normal. Judgment and thought content normal.   Assessment/Plan OR for scs removal.

## 2014-12-17 NOTE — Op Note (Signed)
12/17/2014  3:54 PM  PATIENT:  Sharon Vazquez  34 y.o. female  PRE-OPERATIVE DIAGNOSIS:  Back pain with left sided radiculopathy  POST-OPERATIVE DIAGNOSIS:  Back pain with left sided radiculopathy  PROCEDURE:  Procedure(s): THORACIC SPINAL CORD STIMULATOR REMOVAL  SURGEON: Surgeon(s): Ashok Pall, MD  ASSISTANTS:none  ANESTHESIA:   general  EBL:  Total I/O In: 600 [I.V.:600] Out: -   BLOOD ADMINISTERED:none  CELL SAVER GIVEN:none  COUNT:per nursing  DRAINS: none   SPECIMEN:  No Specimen  DICTATION: [name] was taken to the operating room, intubated, and placed under a general anesthetic without difficulty. She was positioned prone onto a Wilson frame with all pressure points properly padded.Her back was prepped and draped in a sterile manner. I opened the thoracic incision and the right flank incision. I was able to identify, then dissect free from soft tissue the leads. I followed the leads to the paddle then dissected that free also. I removed the pulse generator from the flank incision. I unscrewed the leads from the generator then pulled the leads into the thoracic incision. I then removed the leads and paddle in their entirety. I irrigated the wounds and closed both in layers. I used vicryl sutures, to close. I applied dermabond for sterile dressings. She tolerated the procedure well.   PLAN OF CARE: Admit for overnight observation  PATIENT DISPOSITION:  PACU - hemodynamically stable.   Delay start of Pharmacological VTE agent (>24hrs) due to surgical blood loss or risk of bleeding:  yes

## 2014-12-17 NOTE — Transfer of Care (Signed)
Immediate Anesthesia Transfer of Care Note  Patient: Sharon Vazquez  Procedure(s) Performed: Procedure(s) with comments: THORACIC SPINAL CORD STIMULATOR REMOVAL (N/A) - THORACIC SPINAL CORD STIMULATOR REMOVAL  Patient Location: PACU  Anesthesia Type:General  Level of Consciousness: awake, alert  and oriented  Airway & Oxygen Therapy: Patient Spontanous Breathing and Patient connected to nasal cannula oxygen  Post-op Assessment: Report given to RN and Post -op Vital signs reviewed and stable  Post vital signs: Reviewed and stable  Last Vitals:  Filed Vitals:   12/17/14 1210  BP: 121/71  Pulse: 91  Temp: 37.2 C  Resp: 20    Complications: No apparent anesthesia complications

## 2014-12-18 ENCOUNTER — Encounter (HOSPITAL_COMMUNITY): Payer: Self-pay | Admitting: Neurosurgery

## 2014-12-18 DIAGNOSIS — M5410 Radiculopathy, site unspecified: Secondary | ICD-10-CM | POA: Diagnosis not present

## 2014-12-18 MED ORDER — OXYCODONE-ACETAMINOPHEN 5-325 MG PO TABS: 1.0000 | ORAL_TABLET | Freq: Four times a day (QID) | ORAL | Status: DC | PRN

## 2014-12-18 NOTE — Discharge Summary (Signed)
Physician Discharge Summary  Patient ID: MARIEA MCMARTIN MRN: [id number] DOB/AGE: 07/23/1980 34 y.o.  Admit date: 12/17/2014 Discharge date: 12/18/2014  Admission Diagnoses:intractable pain  Discharge Diagnoses:  Active Problems:   Intractable pain   Discharged Condition: good  Hospital Course: [name] had a spinal cord stimulator placed for chronic pain. She has not found it helpful, and requested that it be removed. [name] was admitted and taken to the operating room for removal of the device. Post op she has done well and will be discharged with a clean, dry, wound. She is voiding, ambulating, and tolerating a regular diet.   Treatments: surgery: as above  Discharge Exam: Blood pressure 127/76, pulse 73, temperature 98.5 F (36.9 C), temperature source Oral, resp. rate 20, height 5\' 6"  (1.676 m), weight 93.985 kg (207 lb 3.2 oz), SpO2 96 %. General appearance: alert, cooperative, appears stated age and mild distress Neurologic: Alert and oriented X 3, normal strength and tone. Normal symmetric reflexes. Normal coordination and gait  Disposition: 01-Home or Self Care Back pain with left sided radiculopathy    Medication List    TAKE these medications        albuterol 108 (90 BASE) MCG/ACT inhaler  Commonly known as:  PROVENTIL HFA;VENTOLIN HFA  Inhale 2 puffs into the lungs every 4 (four) hours as needed for wheezing or shortness of breath.     alclomethasone 0.05 % cream  Commonly known as:  ACLOVATE  Apply 1 application topically 2 (two) times daily as needed (dermatitis).     alprazolam 2 MG tablet  Commonly known as:  XANAX  Take 2 mg by mouth 3 (three) times daily as needed for anxiety (panic attacks).     ALUVEA 39 % Crea  Generic drug:  Urea  Apply 1 application topically 2 (two) times daily.     ARIPiprazole 9.75 MG/1.3ML injection  Commonly known as:  ABILIFY  Inject 400 mg into the muscle every 30 (thirty) days. 20th of the month      ASMANEX HFA 200 MCG/ACT Aero  Generic drug:  Mometasone Furoate  Inhale 1 puff into the lungs daily.     beclomethasone 80 MCG/ACT inhaler  Commonly known as:  QVAR  Inhale 2 puffs into the lungs as needed (shortness of breath).     CAMBIA 50 MG Pack  Generic drug:  Diclofenac Potassium  Take 50 mg by mouth daily as needed (migraines).     cloNIDine 0.1 MG tablet  Commonly known as:  CATAPRES  Take 0.1 mg by mouth 2 (two) times daily.     cyclobenzaprine 5 MG tablet  Commonly known as:  FLEXERIL  Take 1 tablet (5 mg total) by mouth 3 (three) times daily as needed for muscle spasms.     doxepin 50 MG capsule  Commonly known as:  SINEQUAN  Take 50 mg by mouth 3 (three) times daily.     EPIPEN 0.3 mg/0.3 mL Soaj injection  Generic drug:  EPINEPHrine  Inject 0.3 mg into the muscle as needed (allergic reaction).     esomeprazole 40 MG capsule  Commonly known as:  NEXIUM  Take 1 capsule (40 mg total) by mouth daily at 12 noon.     gemfibrozil 600 MG tablet  Commonly known as:  LOPID  600 mg 2 (two) times daily before a meal.     lubiprostone 24 MCG capsule  Commonly known as:  AMITIZA  Take 1 capsule (24 mcg total) by mouth 2 (two) times  daily with a meal.     medroxyPROGESTERone 150 MG/ML injection  Commonly known as:  DEPO-PROVERA  Inject 1 mL (150 mg total) into the muscle every 3 (three) months.     metaxalone 800 MG tablet  Commonly known as:  SKELAXIN  Take 800 mg by mouth every 6 (six) hours as needed for muscle spasms.     NASONEX 50 MCG/ACT nasal spray  Generic drug:  mometasone  Place 1 spray into both nostrils as needed (allergies).     nystatin 100000 UNIT/GM Powd  Apply 1 g topically See admin instructions. Apply two-three times a day to affected area as needed     oxyCODONE-acetaminophen 5-325 MG per tablet  Commonly known as:  PERCOCET/ROXICET  Take 1 tablet by mouth every 4 (four) hours as needed.     oxyCODONE-acetaminophen 5-325 MG per tablet   Commonly known as:  ROXICET  Take 1 tablet by mouth every 6 (six) hours as needed for severe pain.     PATADAY 0.2 % Soln  Generic drug:  Olopatadine HCl  Place 1 drop into both eyes daily as needed (itchy eyes).     pimecrolimus 1 % cream  Commonly known as:  ELIDEL  Apply 1 application topically 2 (two) times daily as needed (facial rash).     promethazine 25 MG tablet  Commonly known as:  PHENERGAN  Take 1 tablet (25 mg total) by mouth every 6 (six) hours as needed.     propranolol 10 MG tablet  Commonly known as:  INDERAL  Take 10 mg by mouth 3 (three) times daily.     silver sulfADIAZINE 1 % cream  Commonly known as:  SILVADENE  Apply 1 application topically daily as needed (infection). For 2 weeks     zolpidem 12.5 MG CR tablet  Commonly known as:  AMBIEN CR  Take 12.5 mg by mouth at bedtime.           Follow-up Information    Follow up with Christell Steinmiller L, MD In 3 weeks.   Specialty:  Neurosurgery   Why:  call office to make an appointment   Contact information:   [address] [place] [address] [phone]       Signed: Winfield Cunas 12/18/2014, 9:49 AM

## 2014-12-18 NOTE — Discharge Instructions (Signed)
Call if wound starts to drain, if your Temp >101.5 Call if wound becomes very red or tender You may shower, the wound may get wet. Pat dry There is no restriction on acitivity, except no driving for 10 days. If you do choose to engage in vigorous activity you will hurt more than usual

## 2015-01-08 ENCOUNTER — Other Ambulatory Visit (INDEPENDENT_AMBULATORY_CARE_PROVIDER_SITE_OTHER): Payer: Self-pay | Admitting: Internal Medicine

## 2015-01-08 MED ORDER — HYDROCORTISONE 2.5 % RE CREA: 1.0000 "application " | TOPICAL_CREAM | Freq: Two times a day (BID) | RECTAL | Status: DC

## 2015-01-08 NOTE — Telephone Encounter (Signed)
Patient presented to Office c/o hemorrhoids and rectal bleeding. Rx for Anusol cream BID sent to her pharmacy

## 2015-02-23 ENCOUNTER — Other Ambulatory Visit (INDEPENDENT_AMBULATORY_CARE_PROVIDER_SITE_OTHER): Payer: Self-pay | Admitting: Internal Medicine

## 2015-02-28 ENCOUNTER — Encounter (HOSPITAL_COMMUNITY): Payer: Self-pay | Admitting: *Deleted

## 2015-02-28 ENCOUNTER — Emergency Department (HOSPITAL_COMMUNITY)
Admission: EM | Admit: 2015-02-28 | Discharge: 2015-02-28 | Disposition: A | Discharge status: Home / Self Care | Payer: BLUE CROSS/BLUE SHIELD | Attending: Emergency Medicine | Admitting: Emergency Medicine

## 2015-02-28 DIAGNOSIS — Z872 Personal history of diseases of the skin and subcutaneous tissue: Secondary | ICD-10-CM | POA: Insufficient documentation

## 2015-02-28 DIAGNOSIS — K5900 Constipation, unspecified: Secondary | ICD-10-CM | POA: Insufficient documentation

## 2015-02-28 DIAGNOSIS — G4700 Insomnia, unspecified: Secondary | ICD-10-CM | POA: Insufficient documentation

## 2015-02-28 DIAGNOSIS — F603 Borderline personality disorder: Secondary | ICD-10-CM | POA: Diagnosis not present

## 2015-02-28 DIAGNOSIS — J45909 Unspecified asthma, uncomplicated: Secondary | ICD-10-CM | POA: Diagnosis not present

## 2015-02-28 DIAGNOSIS — I10 Essential (primary) hypertension: Secondary | ICD-10-CM | POA: Diagnosis not present

## 2015-02-28 DIAGNOSIS — M1990 Unspecified osteoarthritis, unspecified site: Secondary | ICD-10-CM | POA: Insufficient documentation

## 2015-02-28 DIAGNOSIS — F319 Bipolar disorder, unspecified: Secondary | ICD-10-CM | POA: Diagnosis not present

## 2015-02-28 DIAGNOSIS — E669 Obesity, unspecified: Secondary | ICD-10-CM | POA: Insufficient documentation

## 2015-02-28 DIAGNOSIS — Z720 Tobacco use: Secondary | ICD-10-CM | POA: Diagnosis not present

## 2015-02-28 DIAGNOSIS — Z9889 Other specified postprocedural states: Secondary | ICD-10-CM | POA: Diagnosis not present

## 2015-02-28 DIAGNOSIS — Z7951 Long term (current) use of inhaled steroids: Secondary | ICD-10-CM | POA: Diagnosis not present

## 2015-02-28 DIAGNOSIS — Z8619 Personal history of other infectious and parasitic diseases: Secondary | ICD-10-CM | POA: Diagnosis not present

## 2015-02-28 DIAGNOSIS — F909 Attention-deficit hyperactivity disorder, unspecified type: Secondary | ICD-10-CM | POA: Diagnosis not present

## 2015-02-28 DIAGNOSIS — M545 Low back pain: Secondary | ICD-10-CM | POA: Diagnosis present

## 2015-02-28 DIAGNOSIS — Z79899 Other long term (current) drug therapy: Secondary | ICD-10-CM | POA: Diagnosis not present

## 2015-02-28 DIAGNOSIS — M542 Cervicalgia: Secondary | ICD-10-CM | POA: Insufficient documentation

## 2015-02-28 DIAGNOSIS — Z91040 Latex allergy status: Secondary | ICD-10-CM | POA: Insufficient documentation

## 2015-02-28 DIAGNOSIS — K219 Gastro-esophageal reflux disease without esophagitis: Secondary | ICD-10-CM | POA: Diagnosis not present

## 2015-02-28 DIAGNOSIS — F410 Panic disorder [episodic paroxysmal anxiety] without agoraphobia: Secondary | ICD-10-CM | POA: Diagnosis not present

## 2015-02-28 DIAGNOSIS — G8929 Other chronic pain: Secondary | ICD-10-CM | POA: Diagnosis not present

## 2015-02-28 DIAGNOSIS — K589 Irritable bowel syndrome without diarrhea: Secondary | ICD-10-CM | POA: Diagnosis not present

## 2015-02-28 DIAGNOSIS — Z86010 Personal history of colonic polyps: Secondary | ICD-10-CM | POA: Diagnosis not present

## 2015-02-28 DIAGNOSIS — M549 Dorsalgia, unspecified: Secondary | ICD-10-CM
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
## 2015-02-28 NOTE — ED Provider Notes (Signed)
CSN: [id number]     Arrival date & time 02/28/15  1933 History   First MD Initiated Contact with Patient 02/28/15 1948     Chief Complaint  Patient presents with  . Back Pain  . Neck Pain     (Consider location/radiation/quality/duration/timing/severity/associated sxs/prior Treatment) Patient is a 34 y.o. female presenting with back pain and neck pain. The history is provided by the patient.  Back Pain Location:  Lumbar spine Quality:  Aching Radiates to:  L posterior upper leg Pain severity:  Moderate Pain is:  Same all the time Onset quality:  Gradual Duration: 2 years. Timing:  Constant Progression:  Worsening (over last few days) Chronicity:  New Context: not recent injury   Relieved by:  Nothing Worsened by:  Movement Ineffective treatments: medications from PCP. Associated symptoms: no bladder incontinence, no bowel incontinence, no numbness and no weakness   Risk factors comment:  History of back surgery Neck Pain Associated symptoms: no bladder incontinence, no bowel incontinence, no numbness and no weakness     Past Medical History  Diagnosis Date  . Hypertension   . Anxiety   . Bipolar 1 disorder   . ADHD (attention deficit hyperactivity disorder)   . Borderline personality disorder   . Panic attack   . Panic disorder   . HSV-2 (herpes simplex virus 2) infection   . Hx of chlamydia infection   . IBS (irritable bowel syndrome)   . Abnormal Pap smear   . Constipation     takes Dulcolax daily as needed takes Amitiza daily  . Anxiety     takes Xanax daily as needed  . Back spasm     takes Flexeril daily as needed  . Insomnia     takes Ambien nightly  . Hypertension     takes INderal and Clonidine daily  . Shortness of breath     with exertion  . Asthma     Ventolin as needed and QVAR takes daily  . History of bronchitis     last time 4-2yrs ago  . Weakness     numbness and tingling to left foot  . Joint swelling     right knee  . Chronic back  pain     HNP  . Eczema     has 2 creams uses as needed  . GERD (gastroesophageal reflux disease)     takes Tums as needed  . Internal hemorrhoids   . History of colon polyps   . Urinary urgency   . Mental disorder     takes Abilify as needed  . Bipolar 1 disorder     takes DOxepin daily  . Obesity   . Contraceptive management 08/23/2013  . Irregular periods 04/02/2014  . Headache(784.0)   . Headache(784.0)     migraines-last one about 82months ago;takes Topamax daily  . Arthritis     degenerative spine   Past Surgical History  Procedure Laterality Date  . Wisdom tooth extraction    . Bunion removal    . Cholecystectomy  10 yrs ago  . Colonoscopy N/A 08/22/2012    Procedure: COLONOSCOPY;  Surgeon: Rogene Houston, MD;  [facility]: [facility];  Service: Endoscopy;  Laterality: N/A;  100  . Tonsillectomy    . Cholecystectomy  10+yrs ago  . Tonsillectomy      as a child  . Bunionectomy Left     pins in big toe and 2nd toe  . Wisdom teeth extracted    . Epidural  injections      x 2  . Colonoscopy    . Lumbar laminectomy/decompression microdiscectomy Left 07/03/2013    Procedure: LEFT LUMBAR THREE-FOUR microdiskectomy;  Surgeon: Winfield Cunas, MD;  Location: [place] [facility];  Service: Neurosurgery;  Laterality: Left;  LEFT LUMBAR THREE-FOUR microdiskectomy  . Lumbar laminectomy/decompression microdiscectomy Left 11/29/2013    Procedure: LEFT Lumbar Four-Five Redo microdiskectomy;  Surgeon: Winfield Cunas, MD;  Location: [facility];  Service: Neurosurgery;  Laterality: Left;  LEFT Lumbar Four-Five Redo microdiskectomy  . Back surgery    . Spinal cord stimulator trial N/A 04/23/2014    Procedure: LUMBAR SPINAL CORD STIMULATOR TRIAL;  Surgeon: Ashok Pall, MD;  Location: [place] [facility];  Service: Neurosurgery;  Laterality: N/A;  Spinal Cord Stimulator Trial  . Spinal cord stimulator insertion N/A 06/13/2014    Procedure: LUMBAR SPINAL CORD STIMULATOR INSERTION;  Surgeon: Ashok Pall,  MD;  Location: [place] [facility];  Service: Neurosurgery;  Laterality: N/A;  permanent spinal cord stimulator insertion  . Spinal cord stimulator removal N/A 12/17/2014    Procedure: THORACIC SPINAL CORD STIMULATOR REMOVAL;  Surgeon: Ashok Pall, MD;  Location: [place] [facility];  Service: Neurosurgery;  Laterality: N/A;  THORACIC SPINAL CORD STIMULATOR REMOVAL   Family History  Problem Relation Age of Onset  . Diabetes Mother   . Thyroid disease Mother   . Other Mother     PTSD  . Hyperlipidemia Mother   . Hypertension Maternal Aunt   . Diabetes Maternal Aunt   . Thyroid disease Maternal Aunt   . Diabetes Maternal Grandmother   . Heart disease Maternal Grandmother     CHF  . Hypertension Maternal Grandmother   . Dementia Maternal Grandmother   . Diabetes Father   . Hypertension Father   . Obesity Father   . Cancer Paternal Grandmother     breast  . Alcohol abuse Paternal Grandmother   . Crohn's disease Maternal Uncle   . Hyperlipidemia Maternal Uncle   . Other Maternal Uncle     back pain  . Dementia Maternal Uncle   . Diabetes Cousin   . Other Maternal Grandfather     lung transplant   Social History  Substance Use Topics  . Smoking status: Current Some Day Smoker -- 0.50 packs/day for 9 years    Types: Cigarettes  . Smokeless tobacco: Never Used     Comment: 1-2 cig per week  . Alcohol Use: 0.0 oz/week    0 Standard drinks or equivalent per week     Comment: occasionally- once per month    OB History    Gravida Para Term Preterm AB TAB SAB Ectopic Multiple Living   0              Review of Systems  Gastrointestinal: Negative for bowel incontinence.  Genitourinary: Negative for bladder incontinence.  Musculoskeletal: Positive for back pain and neck pain.  Neurological: Negative for weakness and numbness.  All other systems reviewed and are negative.     Allergies  Bee venom; Dilaudid; Latex; Senna; and Adhesive  Home Medications   Prior to Admission medications    Medication Sig Start Date End Date Taking? Authorizing Provider  albuterol (PROVENTIL HFA;VENTOLIN HFA) 108 (90 BASE) MCG/ACT inhaler Inhale 2 puffs into the lungs every 4 (four) hours as needed for wheezing or shortness of breath.     Historical Provider, MD  alclomethasone (ACLOVATE) 0.05 % cream Apply 1 application topically 2 (two) times daily as needed (dermatitis).  05/13/14   Historical Provider, MD  alprazolam Duanne Moron) 2 MG tablet Take 2 mg by mouth 3 (three) times daily as needed for anxiety (panic attacks).    Historical Provider, MD  ALUVEA 39 % CREA Apply 1 application topically 2 (two) times daily.  03/03/14   Historical Provider, MD  ARIPiprazole (ABILIFY) 9.75 MG/1.3ML injection Inject 400 mg into the muscle every 30 (thirty) days. 20th of the month    Historical Provider, MD  [facility] HFA 200 MCG/ACT AERO Inhale 1 puff into the lungs daily.  05/15/14   Historical Provider, MD  beclomethasone (QVAR) 80 MCG/ACT inhaler Inhale 2 puffs into the lungs as needed (shortness of breath).     Historical Provider, MD  cloNIDine (CATAPRES) 0.1 MG tablet Take 0.1 mg by mouth 2 (two) times daily.     Historical Provider, MD  cyclobenzaprine (FLEXERIL) 5 MG tablet Take 1 tablet (5 mg total) by mouth 3 (three) times daily as needed for muscle spasms. Patient taking differently: Take 5 mg by mouth 3 (three) times daily.  11/24/14   Evalee Jefferson, PA-C  Diclofenac Potassium (CAMBIA) 50 MG PACK Take 50 mg by mouth daily as needed (migraines).    Historical Provider, MD  doxepin (SINEQUAN) 50 MG capsule Take 50 mg by mouth 3 (three) times daily.     Historical Provider, MD  EPINEPHrine (EPIPEN) 0.3 mg/0.3 mL SOAJ injection Inject 0.3 mg into the muscle as needed (allergic reaction).    Historical Provider, MD  esomeprazole (NEXIUM) 40 MG capsule TAKE 1 CAPSULE ONCE DAILY AT 12 NOON. 02/23/15   Butch Penny, NP  gemfibrozil (LOPID) 600 MG tablet 600 mg 2 (two) times daily before a meal.  11/11/14   Historical  Provider, MD  hydrocortisone (ANUSOL-HC) 2.5 % rectal cream Place 1 application rectally 2 (two) times daily. 01/08/15   Butch Penny, NP  lubiprostone (AMITIZA) 24 MCG capsule Take 1 capsule (24 mcg total) by mouth 2 (two) times daily with a meal. 03/04/14   Rogene Houston, MD  medroxyPROGESTERone (DEPO-PROVERA) 150 MG/ML injection Inject 1 mL (150 mg total) into the muscle every 3 (three) months. 08/26/14   Estill Dooms, NP  metaxalone (SKELAXIN) 800 MG tablet Take 800 mg by mouth every 6 (six) hours as needed for muscle spasms.    Historical Provider, MD  NASONEX 50 MCG/ACT nasal spray Place 1 spray into both nostrils as needed (allergies).  05/13/14   Historical Provider, MD  nystatin (MYCOSTATIN/NYSTOP) 100000 UNIT/GM POWD Apply 1 g topically See admin instructions. Apply two-three times a day to affected area as needed 08/26/14   Estill Dooms, NP  Olopatadine HCl (PATADAY) 0.2 % SOLN Place 1 drop into both eyes daily as needed (itchy eyes).    Historical Provider, MD  oxyCODONE-acetaminophen (PERCOCET/ROXICET) 5-325 MG per tablet Take 1 tablet by mouth every 4 (four) hours as needed. Patient taking differently: Take 1 tablet by mouth every 6 (six) hours as needed for moderate pain.  11/24/14   Evalee Jefferson, PA-C  oxyCODONE-acetaminophen (ROXICET) 5-325 MG per tablet Take 1 tablet by mouth every 6 (six) hours as needed for severe pain. 12/18/14   Ashok Pall, MD  pimecrolimus (ELIDEL) 1 % cream Apply 1 application topically 2 (two) times daily as needed (facial rash).    Historical Provider, MD  promethazine (PHENERGAN) 25 MG tablet Take 1 tablet (25 mg total) by mouth every 6 (six) hours as needed. Patient taking differently: Take 25 mg by mouth every 6 (  six) hours as needed for nausea.  08/07/14   Fredia Sorrow, MD  propranolol (INDERAL) 10 MG tablet Take 10 mg by mouth 3 (three) times daily.    Historical Provider, MD  silver sulfADIAZINE (SILVADENE) 1 % cream Apply 1 application  topically daily as needed (infection). For 2 weeks    Historical Provider, MD  zolpidem (AMBIEN CR) 12.5 MG CR tablet Take 12.5 mg by mouth at bedtime.     Historical Provider, MD   BP 134/89 mmHg  Pulse 106  Temp(Src) 98.9 F (37.2 C) (Oral)  Resp 20  Ht 5\' 6"  (1.676 m)  Wt 195 lb (88.451 kg)  BMI 31.49 kg/m2  SpO2 100% Physical Exam  Constitutional: She is oriented to person, place, and time. She appears well-developed and well-nourished. No distress.  HENT:  Head: Normocephalic.  Eyes: Conjunctivae are normal.  Neck: Neck supple. No tracheal deviation present.  Cardiovascular: Normal rate and regular rhythm.   Pulmonary/Chest: Effort normal. No respiratory distress.  Abdominal: She exhibits no distension.  Neurological: She is alert and oriented to person, place, and time. Coordination and gait normal. GCS eye subscore is 4. GCS verbal subscore is 5. GCS motor subscore is 6.  Walked normally  Skin: Skin is warm and dry.  Psychiatric: She has a normal mood and affect.    ED Course  Procedures (including critical care time) Labs Review Labs Reviewed - No data to display  Imaging Review No results found. I have personally reviewed and evaluated these images and lab results as part of my medical decision-making.   EKG Interpretation None      MDM   Final diagnoses:  Chronic back pain    34 year old female presents with chronic neck and back pain that she has had for over 2 years. 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 She is awaiting pain management consultation next month and is concerned that she is unable to get an appointment before then. I discussed the role of the emergency department and pain management for acute injuries and discussed the departmental policy regarding chronic pain. The patient became upset after I explained we would be unable to provide her with narcotic pain  medication currently and she stood up, put her clothes on, and briskly walked out of the department under her own power. She was emotionally upset but in no acute distress upon discharge. I see no need for further emergent workup given her recent MRI findings. Patient left prior to receiving discharge paperwork.    Leo Grosser, MD 02/28/15 2113

## 2015-03-03 ENCOUNTER — Ambulatory Visit (INDEPENDENT_AMBULATORY_CARE_PROVIDER_SITE_OTHER): Payer: BLUE CROSS/BLUE SHIELD | Admitting: Women's Health

## 2015-03-03 ENCOUNTER — Encounter: Payer: Self-pay | Admitting: Women's Health

## 2015-03-03 VITALS — BP 130/80 | HR 104 | Wt 195.0 lb

## 2015-03-03 DIAGNOSIS — N921 Excessive and frequent menstruation with irregular cycle: Secondary | ICD-10-CM | POA: Diagnosis not present

## 2015-03-03 MED ORDER — MEGESTROL ACETATE 40 MG PO TABS: ORAL_TABLET | ORAL | Status: DC

## 2015-03-03 NOTE — Progress Notes (Signed)
Patient ID: BRECKYN TROYER, female   DOB: 09/01/1980, 34 y.o.   MRN: [id number]   [facility] Visit  Patient name: Sharon Vazquez MRN [id number]  Date of birth: [birth year]  CC & HPI:  Sharon Vazquez is a 34 y.o. G0P0 African American female presenting today for report of bleeding on depo. Has been on depo since 2000, came off for about 1 yr last year. Since resuming depo has noticed that she will start to bleed around the time she is due for her next shot. Bleeding is heavy, changes saturated pad q 1-1.5hrs. +cramping. Requests medicine to stop bleeding.    Patient's last menstrual period was 02/28/2015. The current method of family planning is Depo-Provera injections. Last pap 2014  Pertinent History Reviewed:  Medical & Surgical Hx:   Past Medical History  Diagnosis Date  . Hypertension   . Anxiety   . Bipolar 1 disorder   . ADHD (attention deficit hyperactivity disorder)   . Borderline personality disorder   . Panic attack   . Panic disorder   . HSV-2 (herpes simplex virus 2) infection   . Hx of chlamydia infection   . IBS (irritable bowel syndrome)   . Abnormal Pap smear   . Constipation     takes Dulcolax daily as needed takes Amitiza daily  . Anxiety     takes Xanax daily as needed  . Back spasm     takes Flexeril daily as needed  . Insomnia     takes Ambien nightly  . Hypertension     takes INderal and Clonidine daily  . Shortness of breath     with exertion  . Asthma     Ventolin as needed and QVAR takes daily  . History of bronchitis     last time 4-15yrs ago  . Weakness     numbness and tingling to left foot  . Joint swelling     right knee  . Chronic back pain     HNP  . Eczema     has 2 creams uses as needed  . GERD (gastroesophageal reflux disease)     takes Tums as needed  . Internal hemorrhoids   . History of colon polyps   . Urinary urgency   . Mental disorder     takes Abilify as needed  . Bipolar 1 disorder     takes  DOxepin daily  . Obesity   . Contraceptive management 08/23/2013  . Irregular periods 04/02/2014  . Headache(784.0)   . Headache(784.0)     migraines-last one about 55months ago;takes Topamax daily  . Arthritis     degenerative spine   Past Surgical History  Procedure Laterality Date  . Wisdom tooth extraction    . Bunion removal    . Cholecystectomy  10 yrs ago  . Colonoscopy N/A 08/22/2012    Procedure: COLONOSCOPY;  Surgeon: Rogene Houston, MD;  [facility]: [facility];  Service: Endoscopy;  Laterality: N/A;  100  . Tonsillectomy    . Cholecystectomy  10+yrs ago  . Tonsillectomy      as a child  . Bunionectomy Left     pins in big toe and 2nd toe  . Wisdom teeth extracted    . Epidural injections      x 2  . Colonoscopy    . Lumbar laminectomy/decompression microdiscectomy Left 07/03/2013    Procedure: LEFT LUMBAR THREE-FOUR microdiskectomy;  Surgeon: Winfield Cunas, MD;  Location: [place]  ORS;  Service: Neurosurgery;  Laterality: Left;  LEFT LUMBAR THREE-FOUR microdiskectomy  . Lumbar laminectomy/decompression microdiscectomy Left 11/29/2013    Procedure: LEFT Lumbar Four-Five Redo microdiskectomy;  Surgeon: Winfield Cunas, MD;  Location: [facility];  Service: Neurosurgery;  Laterality: Left;  LEFT Lumbar Four-Five Redo microdiskectomy  . Back surgery    . Spinal cord stimulator trial N/A 04/23/2014    Procedure: LUMBAR SPINAL CORD STIMULATOR TRIAL;  Surgeon: Ashok Pall, MD;  Location: [place] [facility];  Service: Neurosurgery;  Laterality: N/A;  Spinal Cord Stimulator Trial  . Spinal cord stimulator insertion N/A 06/13/2014    Procedure: LUMBAR SPINAL CORD STIMULATOR INSERTION;  Surgeon: Ashok Pall, MD;  Location: [place] [facility];  Service: Neurosurgery;  Laterality: N/A;  permanent spinal cord stimulator insertion  . Spinal cord stimulator removal N/A 12/17/2014    Procedure: THORACIC SPINAL CORD STIMULATOR REMOVAL;  Surgeon: Ashok Pall, MD;  Location: [place] [facility] OR[facility];  Service:  Neurosurgery;  Laterality: N/A;  THORACIC SPINAL CORD STIMULATOR REMOVAL   Medications: Reviewed & Updated - see associated section Social History: Reviewed -  reports that she has been smoking Cigarettes.  She has a 4.5 pack-year smoking history. She has never used smokeless tobacco.  Objective Findings:  Vitals: BP 130/80 mmHg  Pulse 104  Wt 195 lb (88.451 kg)  LMP 02/28/2015 Body mass index is 31.49 kg/(m^2).  Physical Examination: General appearance - alert, well appearing, and in no distress  No results found for this or any previous visit (from the past 24 hour(s)).   Assessment & Plan:  A:   BTB on depo  Long term depo use  P:  Rx megace algorithm  Increase calcium, Vit D, exercise to help reduce possibility of depo related osteoporosis  Return for jan for , Pap & physical.  Tawnya Crook CNM, [facility] 03/03/2015 3:57 PM

## 2015-03-04 ENCOUNTER — Ambulatory Visit (INDEPENDENT_AMBULATORY_CARE_PROVIDER_SITE_OTHER): Payer: BLUE CROSS/BLUE SHIELD | Admitting: *Deleted

## 2015-03-04 ENCOUNTER — Encounter: Payer: Self-pay | Admitting: *Deleted

## 2015-03-04 DIAGNOSIS — Z3042 Encounter for surveillance of injectable contraceptive: Secondary | ICD-10-CM

## 2015-03-04 DIAGNOSIS — Z3202 Encounter for pregnancy test, result negative: Secondary | ICD-10-CM

## 2015-03-04 LAB — POCT URINE PREGNANCY: Preg Test, Ur: NEGATIVE

## 2015-03-04 MED ORDER — MEDROXYPROGESTERONE ACETATE 150 MG/ML IM SUSP
150.0000 mg | Freq: Once | INTRAMUSCULAR | Status: AC
  Administered 2015-03-04: 150 mg via INTRAMUSCULAR

## 2015-03-04 NOTE — Progress Notes (Signed)
Pt here for Depo. Reports no problems at this time. Return in 12 weeks for next shot. JSY 

## 2015-03-16 ENCOUNTER — Ambulatory Visit (INDEPENDENT_AMBULATORY_CARE_PROVIDER_SITE_OTHER): Payer: 59 | Admitting: Internal Medicine

## 2015-03-31 ENCOUNTER — Encounter (INDEPENDENT_AMBULATORY_CARE_PROVIDER_SITE_OTHER): Payer: Self-pay | Admitting: Internal Medicine

## 2015-03-31 ENCOUNTER — Ambulatory Visit (INDEPENDENT_AMBULATORY_CARE_PROVIDER_SITE_OTHER): Payer: BLUE CROSS/BLUE SHIELD | Admitting: Internal Medicine

## 2015-03-31 VITALS — BP 110/80 | HR 98 | Temp 98.4°F | Resp 18 | Ht 66.0 in | Wt 195.7 lb

## 2015-03-31 DIAGNOSIS — K219 Gastro-esophageal reflux disease without esophagitis: Secondary | ICD-10-CM

## 2015-03-31 DIAGNOSIS — K5900 Constipation, unspecified: Secondary | ICD-10-CM

## 2015-03-31 DIAGNOSIS — K5909 Other constipation: Secondary | ICD-10-CM

## 2015-03-31 DIAGNOSIS — R634 Abnormal weight loss: Secondary | ICD-10-CM

## 2015-03-31 MED ORDER — LUBIPROSTONE 24 MCG PO CAPS: 24.0000 ug | ORAL_CAPSULE | Freq: Every day | ORAL | Status: DC
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
## 2015-03-31 NOTE — Patient Instructions (Signed)
High-fiber diet. Eat 1-2 apples and few prunes every day. Weight check in 3 months.

## 2015-04-03 ENCOUNTER — Emergency Department (HOSPITAL_COMMUNITY)
Admission: EM | Admit: 2015-04-03 | Discharge: 2015-04-04 | Disposition: A | Discharge status: Home / Self Care | Payer: BLUE CROSS/BLUE SHIELD | Attending: Emergency Medicine | Admitting: Emergency Medicine

## 2015-04-03 ENCOUNTER — Encounter (HOSPITAL_COMMUNITY): Payer: Self-pay | Admitting: *Deleted

## 2015-04-03 ENCOUNTER — Emergency Department (HOSPITAL_COMMUNITY): Payer: BLUE CROSS/BLUE SHIELD

## 2015-04-03 DIAGNOSIS — Z86010 Personal history of colonic polyps: Secondary | ICD-10-CM | POA: Insufficient documentation

## 2015-04-03 DIAGNOSIS — Z8742 Personal history of other diseases of the female genital tract: Secondary | ICD-10-CM | POA: Diagnosis not present

## 2015-04-03 DIAGNOSIS — G4700 Insomnia, unspecified: Secondary | ICD-10-CM | POA: Insufficient documentation

## 2015-04-03 DIAGNOSIS — F410 Panic disorder [episodic paroxysmal anxiety] without agoraphobia: Secondary | ICD-10-CM | POA: Diagnosis not present

## 2015-04-03 DIAGNOSIS — Z7951 Long term (current) use of inhaled steroids: Secondary | ICD-10-CM | POA: Insufficient documentation

## 2015-04-03 DIAGNOSIS — M1990 Unspecified osteoarthritis, unspecified site: Secondary | ICD-10-CM | POA: Insufficient documentation

## 2015-04-03 DIAGNOSIS — G8929 Other chronic pain: Secondary | ICD-10-CM | POA: Insufficient documentation

## 2015-04-03 DIAGNOSIS — K589 Irritable bowel syndrome without diarrhea: Secondary | ICD-10-CM | POA: Diagnosis not present

## 2015-04-03 DIAGNOSIS — J40 Bronchitis, not specified as acute or chronic: Secondary | ICD-10-CM

## 2015-04-03 DIAGNOSIS — R059 Cough, unspecified: Secondary | ICD-10-CM

## 2015-04-03 DIAGNOSIS — K5900 Constipation, unspecified: Secondary | ICD-10-CM | POA: Insufficient documentation

## 2015-04-03 DIAGNOSIS — Z8619 Personal history of other infectious and parasitic diseases: Secondary | ICD-10-CM | POA: Insufficient documentation

## 2015-04-03 DIAGNOSIS — Z872 Personal history of diseases of the skin and subcutaneous tissue: Secondary | ICD-10-CM | POA: Diagnosis not present

## 2015-04-03 DIAGNOSIS — I10 Essential (primary) hypertension: Secondary | ICD-10-CM | POA: Diagnosis not present

## 2015-04-03 DIAGNOSIS — J4521 Mild intermittent asthma with (acute) exacerbation: Secondary | ICD-10-CM | POA: Diagnosis not present

## 2015-04-03 DIAGNOSIS — E669 Obesity, unspecified: Secondary | ICD-10-CM | POA: Insufficient documentation

## 2015-04-03 DIAGNOSIS — R05 Cough: Secondary | ICD-10-CM | POA: Diagnosis present

## 2015-04-03 DIAGNOSIS — F319 Bipolar disorder, unspecified: Secondary | ICD-10-CM | POA: Diagnosis not present

## 2015-04-03 DIAGNOSIS — Z792 Long term (current) use of antibiotics: Secondary | ICD-10-CM | POA: Diagnosis not present

## 2015-04-03 DIAGNOSIS — K219 Gastro-esophageal reflux disease without esophagitis: Secondary | ICD-10-CM | POA: Diagnosis not present

## 2015-04-03 DIAGNOSIS — Z79899 Other long term (current) drug therapy: Secondary | ICD-10-CM | POA: Insufficient documentation

## 2015-04-03 MED ORDER — ALBUTEROL SULFATE (2.5 MG/3ML) 0.083% IN NEBU
INHALATION_SOLUTION | RESPIRATORY_TRACT | Status: AC
  Filled 2015-04-03: qty 3

## 2015-04-03 MED ORDER — ALBUTEROL SULFATE HFA 108 (90 BASE) MCG/ACT IN AERS: 1.0000 | INHALATION_SPRAY | Freq: Four times a day (QID) | RESPIRATORY_TRACT | Status: DC | PRN

## 2015-04-03 MED ORDER — IPRATROPIUM-ALBUTEROL 0.5-2.5 (3) MG/3ML IN SOLN
RESPIRATORY_TRACT | Status: AC
  Filled 2015-04-03: qty 3

## 2015-04-03 MED ORDER — PREDNISONE 50 MG PO TABS
60.0000 mg | ORAL_TABLET | Freq: Once | ORAL | Status: AC
  Administered 2015-04-03: 60 mg via ORAL
  Filled 2015-04-03 (×2): qty 1

## 2015-04-03 MED ORDER — BENZONATATE 100 MG PO CAPS
100.0000 mg | ORAL_CAPSULE | Freq: Three times a day (TID) | ORAL | Status: DC
Start: 2015-04-03 — End: 2015-05-27

## 2015-04-03 MED ORDER — AZITHROMYCIN 250 MG PO TABS: 250.0000 mg | ORAL_TABLET | Freq: Every day | ORAL | Status: DC

## 2015-04-03 MED ORDER — ALBUTEROL SULFATE (2.5 MG/3ML) 0.083% IN NEBU
2.5000 mg | INHALATION_SOLUTION | Freq: Once | RESPIRATORY_TRACT | Status: AC
  Administered 2015-04-03: 2.5 mg via RESPIRATORY_TRACT

## 2015-04-03 MED ORDER — IPRATROPIUM-ALBUTEROL 0.5-2.5 (3) MG/3ML IN SOLN
3.0000 mL | Freq: Once | RESPIRATORY_TRACT | Status: AC
  Administered 2015-04-03: 3 mL via RESPIRATORY_TRACT

## 2015-04-03 MED ORDER — PREDNISONE 20 MG PO TABS: 20.0000 mg | ORAL_TABLET | Freq: Two times a day (BID) | ORAL | Status: DC

## 2015-04-03 NOTE — Discharge Instructions (Signed)
Asthma, Adult Asthma is a recurring condition in which the airways tighten and narrow. Asthma can make it difficult to breathe. It can cause coughing, wheezing, and shortness of breath. Asthma episodes, also called asthma attacks, range from minor to life-threatening. Asthma cannot be cured, but medicines and lifestyle changes can help control it. CAUSES Asthma is believed to be caused by inherited (genetic) and environmental factors, but its exact cause is unknown. Asthma may be triggered by allergens, lung infections, or irritants in the air. Asthma triggers are different for each person. Common triggers include:   Animal dander.  Dust mites.  Cockroaches.  Pollen from trees or grass.  Mold.  Smoke.  Air pollutants such as dust, household cleaners, hair sprays, aerosol sprays, paint fumes, strong chemicals, or strong odors.  Cold air, weather changes, and winds (which increase molds and pollens in the air).  Strong emotional expressions such as crying or laughing hard.  Stress.  Certain medicines (such as aspirin) or types of drugs (such as beta-blockers).  Sulfites in foods and drinks. Foods and drinks that may contain sulfites include dried fruit, potato chips, and sparkling grape juice.  Infections or inflammatory conditions such as the flu, a cold, or an inflammation of the nasal membranes (rhinitis).  Gastroesophageal reflux disease (GERD).  Exercise or strenuous activity. SYMPTOMS Symptoms may occur immediately after asthma is triggered or many hours later. Symptoms include:  Wheezing.  Excessive nighttime or early morning coughing.  Frequent or severe coughing with a common cold.  Chest tightness.  Shortness of breath. DIAGNOSIS  The diagnosis of asthma is made by a review of your medical history and a physical exam. Tests may also be performed. These may include:  Lung function studies. These tests show how much air you breathe in and out.  Allergy  tests.  Imaging tests such as X-rays. TREATMENT  Asthma cannot be cured, but it can usually be controlled. Treatment involves identifying and avoiding your asthma triggers. It also involves medicines. There are 2 classes of medicine used for asthma treatment:   Controller medicines. These prevent asthma symptoms from occurring. They are usually taken every day.  Reliever or rescue medicines. These quickly relieve asthma symptoms. They are used as needed and provide short-term relief. Your health care provider will help you create an asthma action plan. An asthma action plan is a written plan for managing and treating your asthma attacks. It includes a list of your asthma triggers and how they may be avoided. It also includes information on when medicines should be taken and when their dosage should be changed. An action plan may also involve the use of a device called a peak flow meter. A peak flow meter measures how well the lungs are working. It helps you monitor your condition. HOME CARE INSTRUCTIONS   Take medicines only as directed by your health care provider. Speak with your health care provider if you have questions about how or when to take the medicines.  Use a peak flow meter as directed by your health care provider. Record and keep track of readings.  Understand and use the action plan to help minimize or stop an asthma attack without needing to seek medical care.  Control your home environment in the following ways to help prevent asthma attacks:  Do not smoke. Avoid being exposed to secondhand smoke.  Change your heating and air conditioning filter regularly.  Limit your use of fireplaces and wood stoves.  Get rid of pests (such as roaches   and mice) and their droppings.  Throw away plants if you see mold on them.  Clean your floors and dust regularly. Use unscented cleaning products.  Try to have someone else vacuum for you regularly. Stay out of rooms while they are  being vacuumed and for a short while afterward. If you vacuum, use a dust mask from a hardware store, a double-layered or microfilter vacuum cleaner bag, or a vacuum cleaner with a HEPA filter.  Replace carpet with wood, tile, or vinyl flooring. Carpet can trap dander and dust.  Use allergy-proof pillows, mattress covers, and box spring covers.  Wash bed sheets and blankets every week in hot water and dry them in a dryer.  Use blankets that are made of polyester or cotton.  Clean bathrooms and kitchens with bleach. If possible, have someone repaint the walls in these rooms with mold-resistant paint. Keep out of the rooms that are being cleaned and painted.  Wash hands frequently. SEEK MEDICAL CARE IF:   You have wheezing, shortness of breath, or a cough even if taking medicine to prevent attacks.  The colored mucus you cough up (sputum) is thicker than usual.  Your sputum changes from clear or white to yellow, green, gray, or bloody.  You have any problems that may be related to the medicines you are taking (such as a rash, itching, swelling, or trouble breathing).  You are using a reliever medicine more than 2-3 times per week.  Your peak flow is still at 50-79% of your personal best after following your action plan for 1 hour.  You have a fever. SEEK IMMEDIATE MEDICAL CARE IF:   You seem to be getting worse and are unresponsive to treatment during an asthma attack.  You are short of breath even at rest.  You get short of breath when doing very little physical activity.  You have difficulty eating, drinking, or talking due to asthma symptoms.  You develop chest pain.  You develop a fast heartbeat.  You have a bluish color to your lips or fingernails.  You are light-headed, dizzy, or faint.  Your peak flow is less than 50% of your personal best.   This information is not intended to replace advice given to you by your health care provider. Make sure you discuss any  questions you have with your health care provider.   Document Released: 05/23/2005 Document Revised: 02/11/2015 Document Reviewed: 12/20/2012 Elsevier Interactive Patient Education 2016 [employer]  

## 2015-04-03 NOTE — ED Provider Notes (Signed)
CSN: [id number]     Arrival date & time 04/03/15  2127 History  By signing my name below, I, Sharon Vazquez, attest that this documentation has been prepared under the direction and in the presence of Tanna Furry, MD. Electronically Signed: Randa Vazquez, ED Scribe. 04/03/2015. 11:55 PM.      Chief Complaint  Patient presents with  . Asthma   Patient is a 34 y.o. female presenting with asthma. The history is provided by the patient. No language interpreter was used.  Asthma Associated symptoms include shortness of breath. Pertinent negatives include no chest pain, no abdominal pain and no headaches.   HPI Comments: Sharon Vazquez is a 34 y.o. female who presents to the Emergency Department complaining of cough and SOB onset tonight. Pt states that she has been up and moving all day today, which relates to her asthma exacerbation. She also states that's he believes that the cold weather made her symptoms worse. Pt states she has tried her inhaler with no relief. Pt states that taking deeps breaths makes her symptoms worse. Pt has had a breathing treatment since being in the ED which has provided her some relief. Pt states she saw her PCP on October 13 th and was prescribed zyrtec due to thinking her symptoms were related to allergies.     Past Medical History  Diagnosis Date  . Hypertension   . Anxiety   . Bipolar 1 disorder ([place])   . ADHD (attention deficit hyperactivity disorder)   . Borderline personality disorder   . Panic attack   . Panic disorder   . HSV-2 (herpes simplex virus 2) infection   . Hx of chlamydia infection   . IBS (irritable bowel syndrome)   . Abnormal Pap smear   . Constipation     takes Dulcolax daily as needed takes Amitiza daily  . Anxiety     takes Xanax daily as needed  . Back spasm     takes Flexeril daily as needed  . Insomnia     takes Ambien nightly  . Hypertension     takes INderal and Clonidine daily  . Shortness of breath     with  exertion  . Asthma     Ventolin as needed and QVAR takes daily  . History of bronchitis     last time 4-40yrs ago  . Weakness     numbness and tingling to left foot  . Joint swelling     right knee  . Chronic back pain     HNP  . Eczema     has 2 creams uses as needed  . GERD (gastroesophageal reflux disease)     takes Tums as needed  . Internal hemorrhoids   . History of colon polyps   . Urinary urgency   . Mental disorder     takes Abilify as needed  . Bipolar 1 disorder ([facility])     takes DOxepin daily  . Obesity   . Contraceptive management 08/23/2013  . Irregular periods 04/02/2014  . Headache(784.0)   . Headache(784.0)     migraines-last one about 108months ago;takes Topamax daily  . Arthritis     degenerative spine   Past Surgical History  Procedure Laterality Date  . Wisdom tooth extraction    . Bunion removal    . Cholecystectomy  10 yrs ago  . Colonoscopy N/A 08/22/2012    Procedure: COLONOSCOPY;  Surgeon: Rogene Houston, MD;  Location: [facility];  Service: Endoscopy;  Laterality: N/A;  100  . Tonsillectomy    . Cholecystectomy  10+yrs ago  . Tonsillectomy      as a child  . Bunionectomy Left     pins in big toe and 2nd toe  . Wisdom teeth extracted    . Epidural injections      x 2  . Colonoscopy    . Lumbar laminectomy/decompression microdiscectomy Left 07/03/2013    Procedure: LEFT LUMBAR THREE-FOUR microdiskectomy;  Surgeon: Winfield Cunas, MD;  Location: [place] [facility];  Service: Neurosurgery;  Laterality: Left;  LEFT LUMBAR THREE-FOUR microdiskectomy  . Lumbar laminectomy/decompression microdiscectomy Left 11/29/2013    Procedure: LEFT Lumbar Four-Five Redo microdiskectomy;  Surgeon: Winfield Cunas, MD;  Location: [facility];  Service: Neurosurgery;  Laterality: Left;  LEFT Lumbar Four-Five Redo microdiskectomy  . Back surgery    . Spinal cord stimulator trial N/A 04/23/2014    Procedure: LUMBAR SPINAL CORD STIMULATOR TRIAL;  Surgeon: Ashok Pall, MD;   Location: [place] [facility];  Service: Neurosurgery;  Laterality: N/A;  Spinal Cord Stimulator Trial  . Spinal cord stimulator insertion N/A 06/13/2014    Procedure: LUMBAR SPINAL CORD STIMULATOR INSERTION;  Surgeon: Ashok Pall, MD;  Location: [place] [facility];  Service: Neurosurgery;  Laterality: N/A;  permanent spinal cord stimulator insertion  . Spinal cord stimulator removal N/A 12/17/2014    Procedure: THORACIC SPINAL CORD STIMULATOR REMOVAL;  Surgeon: Ashok Pall, MD;  Location: [place] [facility];  Service: Neurosurgery;  Laterality: N/A;  THORACIC SPINAL CORD STIMULATOR REMOVAL   Family History  Problem Relation Age of Onset  . Diabetes Mother   . Thyroid disease Mother   . Other Mother     PTSD  . Hyperlipidemia Mother   . Hypertension Maternal Aunt   . Diabetes Maternal Aunt   . Thyroid disease Maternal Aunt   . Diabetes Maternal Grandmother   . Heart disease Maternal Grandmother     CHF  . Hypertension Maternal Grandmother   . Dementia Maternal Grandmother   . Diabetes Father   . Hypertension Father   . Obesity Father   . Cancer Paternal Grandmother     breast  . Alcohol abuse Paternal Grandmother   . Crohn's disease Maternal Uncle   . Hyperlipidemia Maternal Uncle   . Other Maternal Uncle     back pain  . Dementia Maternal Uncle   . Diabetes Cousin   . Other Maternal Grandfather     lung transplant   Social History  Substance Use Topics  . Smoking status: Current Some Day Smoker -- 0.50 packs/day for 9 years    Types: Cigarettes  . Smokeless tobacco: Never Used     Comment: 1-2 cig per week  . Alcohol Use: 0.0 oz/week    0 Standard drinks or equivalent per week     Comment: occasionally- once per month    OB History    Gravida Para Term Preterm AB TAB SAB Ectopic Multiple Living   0               Review of Systems  Constitutional: Negative for fever, chills, diaphoresis, appetite change and fatigue.  HENT: Negative for mouth sores, sore throat and trouble  swallowing.   Eyes: Negative for visual disturbance.  Respiratory: Positive for cough and shortness of breath. Negative for chest tightness and wheezing.   Cardiovascular: Negative for chest pain.  Gastrointestinal: Negative for nausea, vomiting, abdominal pain, diarrhea and abdominal distention.  Endocrine: Negative for polydipsia, polyphagia and  polyuria.  Genitourinary: Negative for dysuria, frequency and hematuria.  Musculoskeletal: Negative for gait problem.  Skin: Negative for color change, pallor and rash.  Neurological: Negative for dizziness, syncope, light-headedness and headaches.  Hematological: Does not bruise/bleed easily.  Psychiatric/Behavioral: Negative for behavioral problems and confusion.      Allergies  Bee venom; Dilaudid; Latex; Senna; and Adhesive  Home Medications   Prior to Admission medications   Medication Sig Start Date End Date Taking? Authorizing Provider  albuterol (PROVENTIL HFA;VENTOLIN HFA) 108 (90 BASE) MCG/ACT inhaler Inhale 1-2 puffs into the lungs every 6 (six) hours as needed for wheezing. 04/03/15   Tanna Furry, MD  alprazolam Duanne Moron) 2 MG tablet Take 2 mg by mouth 3 (three) times daily as needed for anxiety (panic attacks).    Historical Provider, MD  ALUVEA 39 % CREA Apply 1 application topically 2 (two) times daily.  03/03/14   Historical Provider, MD  ARIPiprazole (ABILIFY) 9.75 MG/1.3ML injection Inject 400 mg into the muscle every 30 (thirty) days. 20th of the month    Historical Provider, MD  [facility] HFA 200 MCG/ACT AERO Inhale 1 puff into the lungs daily.  05/15/14   Historical Provider, MD  azithromycin (ZITHROMAX Z-PAK) 250 MG tablet Take 1 tablet (250 mg total) by mouth daily. 04/03/15   Tanna Furry, MD  beclomethasone (QVAR) 80 MCG/ACT inhaler Inhale 2 puffs into the lungs as needed (shortness of breath).     Historical Provider, MD  BELSOMRA 15 MG TABS  04/02/15   Historical Provider, MD  benzonatate (TESSALON) 100 MG capsule Take 1  capsule (100 mg total) by mouth every 8 (eight) hours. 04/03/15   Tanna Furry, MD  cephALEXin (KEFLEX) 500 MG capsule Take 500 mg by mouth 4 (four) times daily.  03/30/15   Historical Provider, MD  cloNIDine (CATAPRES) 0.1 MG tablet Take 0.1 mg by mouth 2 (two) times daily.     Historical Provider, MD  cyclobenzaprine (FLEXERIL) 5 MG tablet Take 1 tablet (5 mg total) by mouth 3 (three) times daily as needed for muscle spasms. Patient taking differently: Take 5 mg by mouth 3 (three) times daily.  11/24/14   Evalee Jefferson, PA-C  Diclofenac Potassium (CAMBIA) 50 MG PACK Take 50 mg by mouth daily as needed (migraines).    Historical Provider, MD  doxepin (SINEQUAN) 50 MG capsule Take 50 mg by mouth 3 (three) times daily.     Historical Provider, MD  DULoxetine (CYMBALTA) 60 MG capsule  04/01/15   Historical Provider, MD  EPINEPHrine (EPIPEN) 0.3 mg/0.3 mL SOAJ injection Inject 0.3 mg into the muscle as needed (allergic reaction).    Historical Provider, MD  esomeprazole (NEXIUM) 40 MG capsule TAKE 1 CAPSULE ONCE DAILY AT 12 NOON. 02/23/15   Butch Penny, NP  fluconazole (DIFLUCAN) 150 MG tablet Take 150 mg by mouth once.  03/30/15   Historical Provider, MD  gemfibrozil (LOPID) 600 MG tablet 600 mg 2 (two) times daily before a meal.  11/11/14   Historical Provider, MD  ibuprofen (ADVIL,MOTRIN) 800 MG tablet Take 800 mg by mouth as needed.  03/30/15   Historical Provider, MD  lubiprostone (AMITIZA) 24 MCG capsule Take 1 capsule (24 mcg total) by mouth daily with breakfast. 03/31/15   Rogene Houston, MD  medroxyPROGESTERone (DEPO-PROVERA) 150 MG/ML injection Inject 1 mL (150 mg total) into the muscle every 3 (three) months. 08/26/14   Estill Dooms, NP  megestrol (MEGACE) 40 MG tablet 3x5d, 2x5d, then 1 daily to help control  vaginal bleeding. Stop taking when bleeding stops. 03/03/15   Roma Schanz, CNM  metaxalone (SKELAXIN) 800 MG tablet Take 800 mg by mouth every 6 (six) hours as needed for muscle  spasms.    Historical Provider, MD  naproxen (NAPROSYN) 500 MG tablet Take 500 mg by mouth as needed.  01/09/15   Historical Provider, MD  NASONEX 50 MCG/ACT nasal spray Place 1 spray into both nostrils as needed (allergies).  05/13/14   Historical Provider, MD  nystatin (MYCOSTATIN/NYSTOP) 100000 UNIT/GM POWD Apply 1 g topically See admin instructions. Apply two-three times a day to affected area as needed 08/26/14   Estill Dooms, NP  Olopatadine HCl (PATADAY) 0.2 % SOLN Place 1 drop into both eyes daily as needed (itchy eyes).    Historical Provider, MD  pimecrolimus (ELIDEL) 1 % cream Apply 1 application topically 2 (two) times daily as needed (facial rash).    Historical Provider, MD  prednisoLONE acetate (PRED FORTE) 1 % ophthalmic suspension Place 2 drops into the right eye as needed.  02/23/15   Historical Provider, MD  predniSONE (DELTASONE) 20 MG tablet Take 1 tablet (20 mg total) by mouth 2 (two) times daily with a meal. 04/03/15   Tanna Furry, MD  promethazine (PHENERGAN) 25 MG tablet Take 1 tablet (25 mg total) by mouth every 6 (six) hours as needed. Patient taking differently: Take 25 mg by mouth every 6 (six) hours as needed for nausea.  08/07/14   Fredia Sorrow, MD  propranolol (INDERAL) 10 MG tablet Take 10 mg by mouth 3 (three) times daily.    Historical Provider, MD  silver sulfADIAZINE (SILVADENE) 1 % cream Apply 1 application topically daily as needed (infection). For 2 weeks    Historical Provider, MD  zolpidem (AMBIEN CR) 12.5 MG CR tablet Take 12.5 mg by mouth at bedtime.     Historical Provider, MD   BP 130/78 mmHg  Pulse 109  Temp(Src) 97.9 F (36.6 C) (Oral)  Resp 18  Ht 5\' 6"  (1.676 m)  Wt 193 lb (87.544 kg)  BMI 31.17 kg/m2  SpO2 97%   Physical Exam  Constitutional: She is oriented to person, place, and time. She appears well-developed and well-nourished. No distress.  HENT:  Head: Normocephalic.  Eyes: Conjunctivae are normal. Pupils are equal, round, and  reactive to light. No scleral icterus.  Neck: Normal range of motion. Neck supple. No thyromegaly present.  Cardiovascular: Normal rate and regular rhythm.  Exam reveals no gallop and no friction rub.   No murmur heard. Pulmonary/Chest: Effort normal. No respiratory distress. She has decreased breath sounds. She has no wheezes. She has no rales.  diminished right base breath sounds.    Abdominal: Soft. Bowel sounds are normal. She exhibits no distension. There is no tenderness. There is no rebound.  Musculoskeletal: Normal range of motion.  Neurological: She is alert and oriented to person, place, and time.  Skin: Skin is warm and dry. No rash noted.  Psychiatric: She has a normal mood and affect. Her behavior is normal.    ED Course  Procedures (including critical care time) DIAGNOSTIC STUDIES: Oxygen Saturation is 98% on RA, normal by my interpretation.    COORDINATION OF CARE: 11:55 PM-Discussed treatment plan with pt at bedside and pt agreed to plan.     Labs Review Labs Reviewed - No data to display  Imaging Review Dg Chest 2 View  04/03/2015  CLINICAL DATA:  Shortness of breath and cough EXAM: CHEST  2 VIEW  COMPARISON:  Chest radiograph July 02, 2013; chest CT August 07, 2014 FINDINGS: Lungs are clear. The heart size and pulmonary vascularity are normal. No adenopathy. No bone lesions. IMPRESSION: No edema or consolidation. Electronically Signed   By: Lowella Grip III M.D.   On: 04/03/2015 22:44      EKG Interpretation None      MDM   Final diagnoses:  Bronchitis  Cough  Asthma, mild intermittent, with acute exacerbation      On recheck she is breathing much more easily after albuterol treatment. Well oxygenated. X-ray without acute findings. Plan is discharge home. Prednisone, Zithromax, albuterol, Tessalon.     Tanna Furry, MD 04/03/15 [phone]

## 2015-04-03 NOTE — ED Notes (Signed)
Pt states wheezing for a week & getting worse the past 2 days. Was at a ball game tonight when it got really bad.

## 2015-04-10 ENCOUNTER — Encounter (INDEPENDENT_AMBULATORY_CARE_PROVIDER_SITE_OTHER): Payer: Self-pay

## 2015-04-23 ENCOUNTER — Other Ambulatory Visit: Payer: Self-pay | Admitting: Neurology

## 2015-04-23 MED ORDER — ALBUTEROL SULFATE HFA 108 (90 BASE) MCG/ACT IN AERS: 1.0000 | INHALATION_SPRAY | Freq: Four times a day (QID) | RESPIRATORY_TRACT | Status: DC | PRN

## 2015-05-07 ENCOUNTER — Ambulatory Visit (INDEPENDENT_AMBULATORY_CARE_PROVIDER_SITE_OTHER): Payer: BLUE CROSS/BLUE SHIELD | Admitting: Internal Medicine

## 2015-05-07 ENCOUNTER — Encounter (INDEPENDENT_AMBULATORY_CARE_PROVIDER_SITE_OTHER): Payer: Self-pay | Admitting: Internal Medicine

## 2015-05-07 VITALS — BP 122/60 | HR 62 | Temp 98.0°F | Ht 66.0 in | Wt 202.0 lb

## 2015-05-07 DIAGNOSIS — K219 Gastro-esophageal reflux disease without esophagitis: Secondary | ICD-10-CM | POA: Diagnosis not present

## 2015-05-07 DIAGNOSIS — K5909 Other constipation: Secondary | ICD-10-CM

## 2015-05-07 NOTE — Patient Instructions (Signed)
OV in 1 year.  

## 2015-05-07 NOTE — Progress Notes (Signed)
Subjective:    Patient ID: Sharon Vazquez, female    DOB: 1980/06/19, 34 y.o.   MRN: [id number]  HPI Last month her weight was 195. Her weight in March was 215. Today your weight is 202.  Hx of constipation and takes Amitiza. The Amitiza works if she takes 2 a day. She is having a BM daily. She says she has GERD which is controlled with Nexium.   She was on Prednisone 20mg  BID for bronchitis last month.  She is concerned about 7 pound gain. Today is requesting an appetite suppressant.   She does not exercise due to her chronic back pain  #4. History of colonic adenoma. She had 10 mm adenoma removed in March 2014. Next colonoscopy would be in March 2017.  CBC    Component Value Date/Time   WBC 9.7 12/16/2014 1004   RBC 4.78 12/16/2014 1004   HGB 15.4* 12/16/2014 1004   HCT 43.0 12/16/2014 1004   PLT 339 12/16/2014 1004   MCV 90.0 12/16/2014 1004   MCH 32.2 12/16/2014 1004   MCHC 35.8 12/16/2014 1004   RDW 13.5 12/16/2014 1004   LYMPHSABS 4.5* 08/07/2014 1842   MONOABS 0.6 08/07/2014 1842   EOSABS 0.4 08/07/2014 1842   BASOSABS 0.0 08/07/2014 1842    Hepatic Function Panel     Component Value Date/Time   PROT 7.2 06/04/2014 1518   ALBUMIN 4.1 06/04/2014 1518   AST 23 06/04/2014 1518   ALT 25 06/04/2014 1518   ALKPHOS 61 06/04/2014 1518   BILITOT 0.4 06/04/2014 1518       Review of Systems Past Medical History  Diagnosis Date  . Hypertension   . Anxiety   . Bipolar 1 disorder ([place])   . ADHD (attention deficit hyperactivity disorder)   . Borderline personality disorder   . Panic attack   . Panic disorder   . HSV-2 (herpes simplex virus 2) infection   . Hx of chlamydia infection   . IBS (irritable bowel syndrome)   . Abnormal Pap smear   . Constipation     takes Dulcolax daily as needed takes Amitiza daily  . Anxiety     takes Xanax daily as needed  . Back spasm     takes Flexeril daily as needed  . Insomnia     takes Ambien nightly  . Hypertension      takes INderal and Clonidine daily  . Shortness of breath     with exertion  . Asthma     Ventolin as needed and QVAR takes daily  . History of bronchitis     last time 4-32yrs ago  . Weakness     numbness and tingling to left foot  . Joint swelling     right knee  . Chronic back pain     HNP  . Eczema     has 2 creams uses as needed  . GERD (gastroesophageal reflux disease)     takes Tums as needed  . Internal hemorrhoids   . History of colon polyps   . Urinary urgency   . Mental disorder     takes Abilify as needed  . Bipolar 1 disorder ([facility])     takes DOxepin daily  . Obesity   . Contraceptive management 08/23/2013  . Irregular periods 04/02/2014  . Headache(784.0)   . Headache(784.0)     migraines-last one about 16months ago;takes Topamax daily  . Arthritis     degenerative spine    Past Surgical  History  Procedure Laterality Date  . Wisdom tooth extraction    . Bunion removal    . Cholecystectomy  10 yrs ago  . Colonoscopy N/A 08/22/2012    Procedure: COLONOSCOPY;  Surgeon: Rogene Houston, MD;  [facility];  Service: Endoscopy;  Laterality: N/A;  100  . Tonsillectomy    . Cholecystectomy  10+yrs ago  . Tonsillectomy      as a child  . Bunionectomy Left     pins in big toe and 2nd toe  . Wisdom teeth extracted    . Epidural injections      x 2  . Colonoscopy    . Lumbar laminectomy/decompression microdiscectomy Left 07/03/2013    Procedure: LEFT LUMBAR THREE-FOUR microdiskectomy;  Surgeon: Winfield Cunas, MD;  Location: [facility];  Service: Neurosurgery;  Laterality: Left;  LEFT LUMBAR THREE-FOUR microdiskectomy  . Lumbar laminectomy/decompression microdiscectomy Left 11/29/2013    Procedure: LEFT Lumbar Four-Five Redo microdiskectomy;  Surgeon: Winfield Cunas, MD;  Location: [facility];  Service: Neurosurgery;  Laterality: Left;  LEFT Lumbar Four-Five Redo microdiskectomy  . Back surgery    . Spinal cord stimulator trial N/A 04/23/2014     Procedure: LUMBAR SPINAL CORD STIMULATOR TRIAL;  Surgeon: Ashok Pall, MD;  Location: [place] [facility];  Service: Neurosurgery;  Laterality: N/A;  Spinal Cord Stimulator Trial  . Spinal cord stimulator insertion N/A 06/13/2014    Procedure: LUMBAR SPINAL CORD STIMULATOR INSERTION;  Surgeon: Ashok Pall, MD;  Location: [facility];  Service: Neurosurgery;  Laterality: N/A;  permanent spinal cord stimulator insertion  . Spinal cord stimulator removal N/A 12/17/2014    Procedure: THORACIC SPINAL CORD STIMULATOR REMOVAL;  Surgeon: Ashok Pall, MD;  Location: [place] [facility];  Service: Neurosurgery;  Laterality: N/A;  THORACIC SPINAL CORD STIMULATOR REMOVAL    Allergies  Allergen Reactions  . Bee Venom Anaphylaxis  . Dilaudid [Hydromorphone Hcl] Anaphylaxis and Hives    Has tolerated morphine since this reaction  . Latex Hives  . Senna Anaphylaxis and Hives  . Adhesive [Tape] Hives and Other (See Comments)    Pulls skin off (use paper tape)    Current Outpatient Prescriptions on File Prior to Visit  Medication Sig Dispense Refill  . albuterol (PROVENTIL HFA;VENTOLIN HFA) 108 (90 BASE) MCG/ACT inhaler Inhale 1-2 puffs into the lungs every 6 (six) hours as needed for wheezing. 1 Inhaler 0  . alprazolam (XANAX) 2 MG tablet Take 2 mg by mouth 3 (three) times daily as needed for anxiety (panic attacks).    Marland Kitchen ALUVEA 39 % CREA Apply 1 application topically 2 (two) times daily.     . ARIPiprazole (ABILIFY) 9.75 MG/1.3ML injection Inject 400 mg into the muscle every 30 (thirty) days. 20th of the month    . ASMANEX HFA 200 MCG/ACT AERO Inhale 1 puff into the lungs daily.   3  . beclomethasone (QVAR) 80 MCG/ACT inhaler Inhale 2 puffs into the lungs as needed (shortness of breath).     . BELSOMRA 15 MG TABS   3  . benzonatate (TESSALON) 100 MG capsule Take 1 capsule (100 mg total) by mouth every 8 (eight) hours. 21 capsule 0  . cloNIDine (CATAPRES) 0.1 MG tablet Take 0.1 mg by mouth 2 (two) times daily.     .  cyclobenzaprine (FLEXERIL) 5 MG tablet Take 1 tablet (5 mg total) by mouth 3 (three) times daily as needed for muscle spasms. (Patient taking differently: Take 5 mg by mouth 3 (  three) times daily. ) 30 tablet 0  . Diclofenac Potassium (CAMBIA) 50 MG PACK Take 50 mg by mouth daily as needed (migraines).    . doxepin (SINEQUAN) 50 MG capsule Take 50 mg by mouth 3 (three) times daily.     . DULoxetine (CYMBALTA) 60 MG capsule   3  . EPINEPHrine (EPIPEN) 0.3 mg/0.3 mL SOAJ injection Inject 0.3 mg into the muscle as needed (allergic reaction).    Marland Kitchen esomeprazole (NEXIUM) 40 MG capsule TAKE 1 CAPSULE ONCE DAILY AT 12 NOON. 30 capsule 5  . fluconazole (DIFLUCAN) 150 MG tablet Take 150 mg by mouth once.   0  . gemfibrozil (LOPID) 600 MG tablet 600 mg 2 (two) times daily before a meal.   4  . megestrol (MEGACE) 40 MG tablet 3x5d, 2x5d, then 1 daily to help control vaginal bleeding. Stop taking when bleeding stops. 45 tablet 1  . metaxalone (SKELAXIN) 800 MG tablet Take 800 mg by mouth every 6 (six) hours as needed for muscle spasms.    . naproxen (NAPROSYN) 500 MG tablet Take 500 mg by mouth as needed.   0  . NASONEX 50 MCG/ACT nasal spray Place 1 spray into both nostrils as needed (allergies).   3  . nystatin (MYCOSTATIN/NYSTOP) 100000 UNIT/GM POWD Apply 1 g topically See admin instructions. Apply two-three times a day to affected area as needed 30 g 2  . Olopatadine HCl (PATADAY) 0.2 % SOLN Place 1 drop into both eyes daily as needed (itchy eyes).    . pimecrolimus (ELIDEL) 1 % cream Apply 1 application topically 2 (two) times daily as needed (facial rash).    . prednisoLONE acetate (PRED FORTE) 1 % ophthalmic suspension Place 2 drops into the right eye as needed.   0  . predniSONE (DELTASONE) 20 MG tablet Take 1 tablet (20 mg total) by mouth 2 (two) times daily with a meal. 10 tablet 0  . promethazine (PHENERGAN) 25 MG tablet Take 1 tablet (25 mg total) by mouth every 6 (six) hours as needed. (Patient  taking differently: Take 25 mg by mouth every 6 (six) hours as needed for nausea. ) 12 tablet 1  . propranolol (INDERAL) 10 MG tablet Take 10 mg by mouth 3 (three) times daily.    . silver sulfADIAZINE (SILVADENE) 1 % cream Apply 1 application topically daily as needed (infection). For 2 weeks    . ibuprofen (ADVIL,MOTRIN) 800 MG tablet Take 800 mg by mouth as needed.   0  . lubiprostone (AMITIZA) 24 MCG capsule Take 1 capsule (24 mcg total) by mouth daily with breakfast. 30 capsule 5  . medroxyPROGESTERone (DEPO-PROVERA) 150 MG/ML injection Inject 1 mL (150 mg total) into the muscle every 3 (three) months. 1 mL 4  . zolpidem (AMBIEN CR) 12.5 MG CR tablet Take 12.5 mg by mouth at bedtime.      No current facility-administered medications on file prior to visit.        Objective:   Physical Exam Blood pressure 122/60, pulse 62, temperature 98 F (36.7 C), height 5\' 6"  (1.676 m), weight 202 lb (91.627 kg).  Alert and oriented. Skin warm and dry. Oral mucosa is moist.   . Sclera anicteric, conjunctivae is pink. Thyroid not enlarged. No cervical lymphadenopathy. Lungs clear. Heart regular rate and rhythm.  Abdomen is soft. Bowel sounds are positive. No hepatomegaly. No abdominal masses felt. No tenderness.  No edema to lower extremities.         Assessment & Plan:  Weight gain. I declined to give an appetite suppressant. Out of my expertise. Last month she had weight gain. Suspect weight gain from the Prednisone she was taking.  GERD: Continue the Nexium. Constipation: Continue Amitiza OV in 1 year.

## 2015-05-19 ENCOUNTER — Encounter (INDEPENDENT_AMBULATORY_CARE_PROVIDER_SITE_OTHER): Payer: Self-pay | Admitting: Internal Medicine

## 2015-05-19 ENCOUNTER — Ambulatory Visit (INDEPENDENT_AMBULATORY_CARE_PROVIDER_SITE_OTHER): Payer: BLUE CROSS/BLUE SHIELD | Admitting: Internal Medicine

## 2015-05-19 VITALS — BP 100/64 | HR 60 | Temp 98.3°F | Ht 67.0 in | Wt 208.0 lb

## 2015-05-19 DIAGNOSIS — K625 Hemorrhage of anus and rectum: Secondary | ICD-10-CM

## 2015-05-19 LAB — CBC WITH DIFFERENTIAL/PLATELET
Basophils Absolute: 0 10*3/uL (ref 0.0–0.1)
Basophils Relative: 0 % (ref 0–1)
EOS ABS: 0.5 10*3/uL (ref 0.0–0.7)
EOS PCT: 5 % (ref 0–5)
HCT: 37.9 % (ref 36.0–46.0)
Hemoglobin: 13.4 g/dL (ref 12.0–15.0)
LYMPHS ABS: 3.6 10*3/uL (ref 0.7–4.0)
Lymphocytes Relative: 37 % (ref 12–46)
MCH: 32.4 pg (ref 26.0–34.0)
MCHC: 35.4 g/dL (ref 30.0–36.0)
MCV: 91.8 fL (ref 78.0–100.0)
MONOS PCT: 5 % (ref 3–12)
MPV: 9 fL (ref 8.6–12.4)
Monocytes Absolute: 0.5 10*3/uL (ref 0.1–1.0)
Neutro Abs: 5.2 10*3/uL (ref 1.7–7.7)
Neutrophils Relative %: 53 % (ref 43–77)
PLATELETS: 397 10*3/uL (ref 150–400)
RBC: 4.13 MIL/uL (ref 3.87–5.11)
RDW: 15 % (ref 11.5–15.5)
WBC: 9.8 10*3/uL (ref 4.0–10.5)

## 2015-05-19 NOTE — Progress Notes (Signed)
Subjective:    Patient ID: Sharon Vazquez, female    DOB: 05/05/1981, 34 y.o.   MRN: [id number]  HPI last seen 1st of December of this year requesting an appetite suppressant. She was advised to f/u up with her PCP. She tells me she has been having rectal bleeding and blood in her stool. She has tried the anusol supp x 2 but did not help. She says the bleeding is better since yesterday. She saw a small amt today.  She also c/o abdominal pain. She says she went to [facility] but a rectal exam was not done. She tells me today she feels okay. She was weak Saturday and Sunday. She has been drinking Gatorade and a lot of water. There has been no fever. Appetite is okay. Last weight 1st of this month was 202. Today her weight is 208.  She has a BM every other day.     08/22/2012   Colonoscopy with snare polypectomy.  Indications:  Patient is 34 year old African female who presents with 6 week history of intermittent hematochezia and lower abdominal pain. She also has irregular bowel movements but predominantly with constipation. She is doing better with Linzess. Normal terminal ileum. Single diverticulum at cecum. 10 mm broad-based polyp snared from cecum. No evidence of endoscopic colitis. Small external hemorrhoids. Biopsy: Sessile serrated adenoma/polyp with dysplasia. No high grade dysplasia or malignancy   Review of Systems Past Medical History  Diagnosis Date  . Hypertension   . Anxiety   . Bipolar 1 disorder ([place])   . ADHD (attention deficit hyperactivity disorder)   . Borderline personality disorder   . Panic attack   . Panic disorder   . HSV-2 (herpes simplex virus 2) infection   . Hx of chlamydia infection   . IBS (irritable bowel syndrome)   . Abnormal Pap smear   . Constipation     takes Dulcolax daily as needed takes Amitiza daily  . Anxiety     takes Xanax daily as needed  . Back spasm     takes Flexeril daily as needed  . Insomnia     takes Ambien nightly    . Hypertension     takes INderal and Clonidine daily  . Shortness of breath     with exertion  . Asthma     Ventolin as needed and QVAR takes daily  . History of bronchitis     last time 4-44yrs ago  . Weakness     numbness and tingling to left foot  . Joint swelling     right knee  . Chronic back pain     HNP  . Eczema     has 2 creams uses as needed  . GERD (gastroesophageal reflux disease)     takes Tums as needed  . Internal hemorrhoids   . History of colon polyps   . Urinary urgency   . Mental disorder     takes Abilify as needed  . Bipolar 1 disorder ([facility])     takes DOxepin daily  . Obesity   . Contraceptive management 08/23/2013  . Irregular periods 04/02/2014  . Headache(784.0)   . Headache(784.0)     migraines-last one about 70months ago;takes Topamax daily  . Arthritis     degenerative spine    Past Surgical History  Procedure Laterality Date  . Wisdom tooth extraction    . Bunion removal    . Cholecystectomy  10 yrs ago  . Colonoscopy N/A 08/22/2012  Procedure: COLONOSCOPY;  Surgeon: Rogene Houston, MD;  [facility];  Service: Endoscopy;  Laterality: N/A;  100  . Tonsillectomy    . Cholecystectomy  10+yrs ago  . Tonsillectomy      as a child  . Bunionectomy Left     pins in big toe and 2nd toe  . Wisdom teeth extracted    . Epidural injections      x 2  . Colonoscopy    . Lumbar laminectomy/decompression microdiscectomy Left 07/03/2013    Procedure: LEFT LUMBAR THREE-FOUR microdiskectomy;  Surgeon: Winfield Cunas, MD;  Location: [place] [facility];  Service: Neurosurgery;  Laterality: Left;  LEFT LUMBAR THREE-FOUR microdiskectomy  . Lumbar laminectomy/decompression microdiscectomy Left 11/29/2013    Procedure: LEFT Lumbar Four-Five Redo microdiskectomy;  Surgeon: Winfield Cunas, MD;  Location: [facility];  Service: Neurosurgery;  Laterality: Left;  LEFT Lumbar Four-Five Redo microdiskectomy  . Back surgery    . Spinal cord stimulator trial  N/A 04/23/2014    Procedure: LUMBAR SPINAL CORD STIMULATOR TRIAL;  Surgeon: Ashok Pall, MD;  Location: [place] [facility];  Service: Neurosurgery;  Laterality: N/A;  Spinal Cord Stimulator Trial  . Spinal cord stimulator insertion N/A 06/13/2014    Procedure: LUMBAR SPINAL CORD STIMULATOR INSERTION;  Surgeon: Ashok Pall, MD;  Location: [place] [facility];  Service: Neurosurgery;  Laterality: N/A;  permanent spinal cord stimulator insertion  . Spinal cord stimulator removal N/A 12/17/2014    Procedure: THORACIC SPINAL CORD STIMULATOR REMOVAL;  Surgeon: Ashok Pall, MD;  Location: [place] [facility];  Service: Neurosurgery;  Laterality: N/A;  THORACIC SPINAL CORD STIMULATOR REMOVAL    Allergies  Allergen Reactions  . Bee Venom Anaphylaxis  . Dilaudid [Hydromorphone Hcl] Anaphylaxis and Hives    Has tolerated morphine since this reaction  . Latex Hives  . Senna Anaphylaxis and Hives  . Adhesive [Tape] Hives and Other (See Comments)    Pulls skin off (use paper tape)    Current Outpatient Prescriptions on File Prior to Visit  Medication Sig Dispense Refill  . albuterol (PROVENTIL HFA;VENTOLIN HFA) 108 (90 BASE) MCG/ACT inhaler Inhale 1-2 puffs into the lungs every 6 (six) hours as needed for wheezing. 1 Inhaler 0  . alprazolam (XANAX) 2 MG tablet Take 2 mg by mouth 3 (three) times daily as needed for anxiety (panic attacks).    Marland Kitchen ALUVEA 39 % CREA Apply 1 application topically 2 (two) times daily.     . ARIPiprazole (ABILIFY) 9.75 MG/1.3ML injection Inject 400 mg into the muscle every 30 (thirty) days. 20th of the month    . ASMANEX HFA 200 MCG/ACT AERO Inhale 1 puff into the lungs daily.   3  . beclomethasone (QVAR) 80 MCG/ACT inhaler Inhale 2 puffs into the lungs as needed (shortness of breath).     . BELSOMRA 15 MG TABS   3  . benzonatate (TESSALON) 100 MG capsule Take 1 capsule (100 mg total) by mouth every 8 (eight) hours. 21 capsule 0  . cloNIDine (CATAPRES) 0.1 MG tablet Take 0.1 mg by mouth 2 (two)  times daily.     . cyclobenzaprine (FLEXERIL) 5 MG tablet Take 1 tablet (5 mg total) by mouth 3 (three) times daily as needed for muscle spasms. (Patient taking differently: Take 5 mg by mouth 3 (three) times daily. ) 30 tablet 0  . Diclofenac Potassium (CAMBIA) 50 MG PACK Take 50 mg by mouth daily as needed (migraines).    . doxepin (SINEQUAN) 50 MG capsule  Take 50 mg by mouth 3 (three) times daily.     . DULoxetine (CYMBALTA) 60 MG capsule   3  . EPINEPHrine (EPIPEN) 0.3 mg/0.3 mL SOAJ injection Inject 0.3 mg into the muscle as needed (allergic reaction).    Marland Kitchen esomeprazole (NEXIUM) 40 MG capsule TAKE 1 CAPSULE ONCE DAILY AT 12 NOON. 30 capsule 5  . fluconazole (DIFLUCAN) 150 MG tablet Take 150 mg by mouth once.   0  . gemfibrozil (LOPID) 600 MG tablet 600 mg 2 (two) times daily before a meal.   4  . ibuprofen (ADVIL,MOTRIN) 800 MG tablet Take 800 mg by mouth as needed.   0  . lubiprostone (AMITIZA) 24 MCG capsule Take 1 capsule (24 mcg total) by mouth daily with breakfast. 30 capsule 5  . medroxyPROGESTERone (DEPO-PROVERA) 150 MG/ML injection Inject 1 mL (150 mg total) into the muscle every 3 (three) months. 1 mL 4  . metaxalone (SKELAXIN) 800 MG tablet Take 800 mg by mouth every 6 (six) hours as needed for muscle spasms.    Marland Kitchen NASONEX 50 MCG/ACT nasal spray Place 1 spray into both nostrils as needed (allergies).   3  . nystatin (MYCOSTATIN/NYSTOP) 100000 UNIT/GM POWD Apply 1 g topically See admin instructions. Apply two-three times a day to affected area as needed 30 g 2  . Olopatadine HCl (PATADAY) 0.2 % SOLN Place 1 drop into both eyes daily as needed (itchy eyes).    . pimecrolimus (ELIDEL) 1 % cream Apply 1 application topically 2 (two) times daily as needed (facial rash).    . prednisoLONE acetate (PRED FORTE) 1 % ophthalmic suspension Place 2 drops into the right eye as needed.   0  . promethazine (PHENERGAN) 25 MG tablet Take 1 tablet (25 mg total) by mouth every 6 (six) hours as needed.  (Patient taking differently: Take 25 mg by mouth every 6 (six) hours as needed for nausea. ) 12 tablet 1  . propranolol (INDERAL) 10 MG tablet Take 10 mg by mouth 3 (three) times daily.    . silver sulfADIAZINE (SILVADENE) 1 % cream Apply 1 application topically daily as needed (infection). For 2 weeks    . Suvorexant (BELSOMRA) 15 MG TABS Take by mouth.    . zolpidem (AMBIEN CR) 12.5 MG CR tablet Take 12.5 mg by mouth at bedtime.     . megestrol (MEGACE) 40 MG tablet 3x5d, 2x5d, then 1 daily to help control vaginal bleeding. Stop taking when bleeding stops. (Patient not taking: Reported on 05/19/2015) 45 tablet 1  . naproxen (NAPROSYN) 500 MG tablet Take 500 mg by mouth as needed.   0   No current facility-administered medications on file prior to visit.        Objective:   Physical ExamBlood pressure 100/64, pulse 60, temperature 98.3 F (36.8 C), height 5\' 7"  (1.702 m), weight 208 lb (94.348 kg). Alert and oriented. Skin warm and dry. Oral mucosa is moist.   . Sclera anicteric, conjunctivae is pink. Thyroid not enlarged. No cervical lymphadenopathy. Lungs clear. Heart regular rate and rhythm.  Abdomen is soft. Bowel sounds are positive. No hepatomegaly. No abdominal masses felt. No tenderness.  No edema to lower extremities.  Rectal exam guaiac negative. Rectal exam painful.     Lot [id number] Ex 9/17    Assessment & Plan:  Rectal bleeding.Probably  hemorrhoids. Will get a CBC. Continue the Anusol BID. Further recommendations to follow.  Will be on a recall for a colonoscopy in March.

## 2015-05-19 NOTE — Patient Instructions (Signed)
Continue the Anusol supp BID. CBC today.

## 2015-05-27 ENCOUNTER — Encounter: Payer: Self-pay | Admitting: *Deleted

## 2015-05-27 ENCOUNTER — Telehealth (INDEPENDENT_AMBULATORY_CARE_PROVIDER_SITE_OTHER): Payer: Self-pay | Admitting: *Deleted

## 2015-05-27 ENCOUNTER — Ambulatory Visit (INDEPENDENT_AMBULATORY_CARE_PROVIDER_SITE_OTHER): Payer: BLUE CROSS/BLUE SHIELD | Admitting: *Deleted

## 2015-05-27 DIAGNOSIS — Z3202 Encounter for pregnancy test, result negative: Secondary | ICD-10-CM

## 2015-05-27 DIAGNOSIS — Z3042 Encounter for surveillance of injectable contraceptive: Secondary | ICD-10-CM | POA: Diagnosis not present

## 2015-05-27 LAB — POCT URINE PREGNANCY: PREG TEST UR: NEGATIVE

## 2015-05-27 MED ORDER — MEDROXYPROGESTERONE ACETATE 150 MG/ML IM SUSP
150.0000 mg | Freq: Once | INTRAMUSCULAR | Status: AC
  Administered 2015-05-27: 150 mg via INTRAMUSCULAR

## 2015-05-27 NOTE — Telephone Encounter (Signed)
She said she was returning your call

## 2015-05-27 NOTE — Progress Notes (Signed)
Pt here for Depo. Reports no problems at this time. Return in 12 weeks for next shot. JSY 

## 2015-06-02 ENCOUNTER — Telehealth (INDEPENDENT_AMBULATORY_CARE_PROVIDER_SITE_OTHER): Payer: Self-pay | Admitting: *Deleted

## 2015-06-02 NOTE — Telephone Encounter (Signed)
Please call patient, would like results of lab work [phone]

## 2015-06-03 NOTE — Telephone Encounter (Signed)
Results have been given to patient 

## 2015-06-03 NOTE — Telephone Encounter (Signed)
Results given yesterday

## 2015-06-21 ENCOUNTER — Encounter (HOSPITAL_COMMUNITY): Payer: Self-pay | Admitting: Emergency Medicine

## 2015-06-21 ENCOUNTER — Emergency Department (HOSPITAL_COMMUNITY)
Admission: EM | Admit: 2015-06-21 | Discharge: 2015-06-21 | Disposition: A | Discharge status: Home / Self Care | Payer: BLUE CROSS/BLUE SHIELD | Attending: Emergency Medicine | Admitting: Emergency Medicine

## 2015-06-21 DIAGNOSIS — Z7951 Long term (current) use of inhaled steroids: Secondary | ICD-10-CM | POA: Diagnosis not present

## 2015-06-21 DIAGNOSIS — K219 Gastro-esophageal reflux disease without esophagitis: Secondary | ICD-10-CM | POA: Insufficient documentation

## 2015-06-21 DIAGNOSIS — G8929 Other chronic pain: Secondary | ICD-10-CM | POA: Diagnosis not present

## 2015-06-21 DIAGNOSIS — I10 Essential (primary) hypertension: Secondary | ICD-10-CM | POA: Insufficient documentation

## 2015-06-21 DIAGNOSIS — M1990 Unspecified osteoarthritis, unspecified site: Secondary | ICD-10-CM | POA: Insufficient documentation

## 2015-06-21 DIAGNOSIS — G43909 Migraine, unspecified, not intractable, without status migrainosus: Secondary | ICD-10-CM | POA: Diagnosis not present

## 2015-06-21 DIAGNOSIS — Z91040 Latex allergy status: Secondary | ICD-10-CM | POA: Diagnosis not present

## 2015-06-21 DIAGNOSIS — Z872 Personal history of diseases of the skin and subcutaneous tissue: Secondary | ICD-10-CM | POA: Insufficient documentation

## 2015-06-21 DIAGNOSIS — Z8742 Personal history of other diseases of the female genital tract: Secondary | ICD-10-CM | POA: Insufficient documentation

## 2015-06-21 DIAGNOSIS — F319 Bipolar disorder, unspecified: Secondary | ICD-10-CM | POA: Insufficient documentation

## 2015-06-21 DIAGNOSIS — K5900 Constipation, unspecified: Secondary | ICD-10-CM | POA: Insufficient documentation

## 2015-06-21 DIAGNOSIS — K625 Hemorrhage of anus and rectum: Secondary | ICD-10-CM | POA: Diagnosis present

## 2015-06-21 DIAGNOSIS — G4700 Insomnia, unspecified: Secondary | ICD-10-CM | POA: Diagnosis not present

## 2015-06-21 DIAGNOSIS — F17210 Nicotine dependence, cigarettes, uncomplicated: Secondary | ICD-10-CM | POA: Insufficient documentation

## 2015-06-21 DIAGNOSIS — F603 Borderline personality disorder: Secondary | ICD-10-CM | POA: Insufficient documentation

## 2015-06-21 DIAGNOSIS — F909 Attention-deficit hyperactivity disorder, unspecified type: Secondary | ICD-10-CM | POA: Diagnosis not present

## 2015-06-21 DIAGNOSIS — F410 Panic disorder [episodic paroxysmal anxiety] without agoraphobia: Secondary | ICD-10-CM | POA: Insufficient documentation

## 2015-06-21 DIAGNOSIS — Z86010 Personal history of colonic polyps: Secondary | ICD-10-CM | POA: Diagnosis not present

## 2015-06-21 DIAGNOSIS — Z8619 Personal history of other infectious and parasitic diseases: Secondary | ICD-10-CM | POA: Diagnosis not present

## 2015-06-21 DIAGNOSIS — J45909 Unspecified asthma, uncomplicated: Secondary | ICD-10-CM | POA: Diagnosis not present

## 2015-06-21 DIAGNOSIS — K922 Gastrointestinal hemorrhage, unspecified: Secondary | ICD-10-CM | POA: Diagnosis not present

## 2015-06-21 DIAGNOSIS — E669 Obesity, unspecified: Secondary | ICD-10-CM | POA: Insufficient documentation

## 2015-06-21 DIAGNOSIS — Z79899 Other long term (current) drug therapy: Secondary | ICD-10-CM | POA: Insufficient documentation

## 2015-06-21 DIAGNOSIS — Z9049 Acquired absence of other specified parts of digestive tract: Secondary | ICD-10-CM | POA: Insufficient documentation

## 2015-06-21 DIAGNOSIS — K921 Melena: Secondary | ICD-10-CM | POA: Diagnosis not present

## 2015-06-21 LAB — CBC WITH DIFFERENTIAL/PLATELET
Basophils Absolute: 0 10*3/uL (ref 0.0–0.1)
Basophils Relative: 0 %
EOS PCT: 7 %
Eosinophils Absolute: 0.6 10*3/uL (ref 0.0–0.7)
HCT: 37.9 % (ref 36.0–46.0)
Hemoglobin: 12.9 g/dL (ref 12.0–15.0)
LYMPHS ABS: 3.7 10*3/uL (ref 0.7–4.0)
LYMPHS PCT: 41 %
MCH: 31 pg (ref 26.0–34.0)
MCHC: 34 g/dL (ref 30.0–36.0)
MCV: 91.1 fL (ref 78.0–100.0)
MONO ABS: 0.3 10*3/uL (ref 0.1–1.0)
MONOS PCT: 4 %
Neutro Abs: 4.3 10*3/uL (ref 1.7–7.7)
Neutrophils Relative %: 48 %
PLATELETS: 382 10*3/uL (ref 150–400)
RBC: 4.16 MIL/uL (ref 3.87–5.11)
RDW: 13.2 % (ref 11.5–15.5)
WBC: 8.9 10*3/uL (ref 4.0–10.5)

## 2015-06-21 LAB — COMPREHENSIVE METABOLIC PANEL
ALT: 19 U/L (ref 14–54)
AST: 21 U/L (ref 15–41)
Albumin: 4 g/dL (ref 3.5–5.0)
Alkaline Phosphatase: 52 U/L (ref 38–126)
Anion gap: 7 (ref 5–15)
BUN: 9 mg/dL (ref 6–20)
CHLORIDE: 107 mmol/L (ref 101–111)
CO2: 25 mmol/L (ref 22–32)
CREATININE: 0.88 mg/dL (ref 0.44–1.00)
Calcium: 9.1 mg/dL (ref 8.9–10.3)
Glucose, Bld: 89 mg/dL (ref 65–99)
Potassium: 4.4 mmol/L (ref 3.5–5.1)
Sodium: 139 mmol/L (ref 135–145)
Total Bilirubin: 0.4 mg/dL (ref 0.3–1.2)
Total Protein: 7.6 g/dL (ref 6.5–8.1)

## 2015-06-21 LAB — POC OCCULT BLOOD, ED: FECAL OCCULT BLD: POSITIVE — AB

## 2015-06-21 NOTE — ED Notes (Signed)
Patient c/o rectal bleeding with bright red blood. Patient states was sitting in church this morning when she started to have abd cramping with nausea and diarrhea. Patient denies any vomiting. Per patient has had this in past and is currently seeing Dr Laural Golden, was told enteral hemorrhoids but states "I don't think that is what it is." Denies staring with BMs and reports using Anusol suppository with no relief.

## 2015-06-21 NOTE — Discharge Instructions (Signed)
Continue your treatment for your hemorrhoids - your testing here was normal other than the blood in your stool which is expected given your history of internal hemorrhoids.  Please obtain all of your results from medical records or have your doctors office obtain the results - share them with your doctor - you should be seen at your doctors office in the next 2 days. Call today to arrange your follow up. Take the medications as prescribed. Please review all of the medicines and only take them if you do not have an allergy to them. Please be aware that if you are taking birth control pills, taking other prescriptions, ESPECIALLY ANTIBIOTICS may make the birth control ineffective - if this is the case, either do not engage in sexual activity or use alternative methods of birth control such as condoms until you have finished the medicine and your family doctor says it is OK to restart them. If you are on a blood thinner such as COUMADIN, be aware that any other medicine that you take may cause the coumadin to either work too much, or not enough - you should have your coumadin level rechecked in next 7 days if this is the case.  ?  It is also a possibility that you have an allergic reaction to any of the medicines that you have been prescribed - Everybody reacts differently to medications and while MOST people have no trouble with most medicines, you may have a reaction such as nausea, vomiting, rash, swelling, shortness of breath. If this is the case, please stop taking the medicine immediately and contact your physician.  ?  You should return to the ER if you develop severe or worsening symptoms.

## 2015-06-21 NOTE — ED Provider Notes (Signed)
CSN: [id number]     Arrival date & time 06/21/15  1242 History   First MD Initiated Contact with Patient 06/21/15 1414     Chief Complaint  Patient presents with  . Rectal Bleeding     (Consider location/radiation/quality/duration/timing/severity/associated sxs/prior Treatment) HPI Comments: The patient is a 35 year old female, she has a history of internal hemorrhoids and has been seen by gastroenterology in the past, she has been using suppositories at their request, she states that over the last month she has had 3 bloody bowel movements, these are intermittent, she does have occasional pain with wiping, occasional pain with sitting, she has loose stools all the time. She denies any vomiting, she has had a normal diet, she has intermittent abdominal cramping but states that she has irritable bowel syndrome and takes intermittent medications as needed for that as well. She has had a colonoscopy in the past which showed a polyp, she is scheduled for another colonoscopy in March according to the patient's report. Today she had a bowel movement that had some blood in it which is why she is here concerned about the bleeding she has never needed a blood transfusion according to her report.  Patient is a 36 y.o. female presenting with hematochezia. The history is provided by the patient.  Rectal Bleeding   Past Medical History  Diagnosis Date  . Hypertension   . Anxiety   . Bipolar 1 disorder ([place])   . ADHD (attention deficit hyperactivity disorder)   . Borderline personality disorder   . Panic attack   . Panic disorder   . HSV-2 (herpes simplex virus 2) infection   . Hx of chlamydia infection   . IBS (irritable bowel syndrome)   . Abnormal Pap smear   . Constipation     takes Dulcolax daily as needed takes Amitiza daily  . Anxiety     takes Xanax daily as needed  . Back spasm     takes Flexeril daily as needed  . Insomnia     takes Ambien nightly  . Hypertension     takes INderal  and Clonidine daily  . Shortness of breath     with exertion  . Asthma     Ventolin as needed and QVAR takes daily  . History of bronchitis     last time 4-12yrs ago  . Weakness     numbness and tingling to left foot  . Joint swelling     right knee  . Chronic back pain     HNP  . Eczema     has 2 creams uses as needed  . GERD (gastroesophageal reflux disease)     takes Tums as needed  . Internal hemorrhoids   . History of colon polyps   . Urinary urgency   . Mental disorder     takes Abilify as needed  . Bipolar 1 disorder ([facility])     takes DOxepin daily  . Obesity   . Contraceptive management 08/23/2013  . Irregular periods 04/02/2014  . Headache(784.0)   . Headache(784.0)     migraines-last one about 55months ago;takes Topamax daily  . Arthritis     degenerative spine   Past Surgical History  Procedure Laterality Date  . Wisdom tooth extraction    . Bunion removal    . Cholecystectomy  10 yrs ago  . Colonoscopy N/A 08/22/2012    Procedure: COLONOSCOPY;  Surgeon: Rogene Houston, MD;  Location: [facility];  Service: Endoscopy;  Laterality: N/A;  100  . Tonsillectomy    . Cholecystectomy  10+yrs ago  . Tonsillectomy      as a child  . Bunionectomy Left     pins in big toe and 2nd toe  . Wisdom teeth extracted    . Epidural injections      x 2  . Colonoscopy    . Lumbar laminectomy/decompression microdiscectomy Left 07/03/2013    Procedure: LEFT LUMBAR THREE-FOUR microdiskectomy;  Surgeon: Winfield Cunas, MD;  Location: [place] [facility];  Service: Neurosurgery;  Laterality: Left;  LEFT LUMBAR THREE-FOUR microdiskectomy  . Lumbar laminectomy/decompression microdiscectomy Left 11/29/2013    Procedure: LEFT Lumbar Four-Five Redo microdiskectomy;  Surgeon: Winfield Cunas, MD;  Location: [facility];  Service: Neurosurgery;  Laterality: Left;  LEFT Lumbar Four-Five Redo microdiskectomy  . Back surgery    . Spinal cord stimulator trial N/A 04/23/2014    Procedure: LUMBAR  SPINAL CORD STIMULATOR TRIAL;  Surgeon: Ashok Pall, MD;  Location: [place] [facility];  Service: Neurosurgery;  Laterality: N/A;  Spinal Cord Stimulator Trial  . Spinal cord stimulator insertion N/A 06/13/2014    Procedure: LUMBAR SPINAL CORD STIMULATOR INSERTION;  Surgeon: Ashok Pall, MD;  Location: [place] [facility];  Service: Neurosurgery;  Laterality: N/A;  permanent spinal cord stimulator insertion  . Spinal cord stimulator removal N/A 12/17/2014    Procedure: THORACIC SPINAL CORD STIMULATOR REMOVAL;  Surgeon: Ashok Pall, MD;  Location: [place] [facility];  Service: Neurosurgery;  Laterality: N/A;  THORACIC SPINAL CORD STIMULATOR REMOVAL   Family History  Problem Relation Age of Onset  . Diabetes Mother   . Thyroid disease Mother   . Other Mother     PTSD  . Hyperlipidemia Mother   . Hypertension Maternal Aunt   . Diabetes Maternal Aunt   . Thyroid disease Maternal Aunt   . Diabetes Maternal Grandmother   . Heart disease Maternal Grandmother     CHF  . Hypertension Maternal Grandmother   . Dementia Maternal Grandmother   . Diabetes Father   . Hypertension Father   . Obesity Father   . Cancer Paternal Grandmother     breast  . Alcohol abuse Paternal Grandmother   . Crohn's disease Maternal Uncle   . Hyperlipidemia Maternal Uncle   . Other Maternal Uncle     back pain  . Dementia Maternal Uncle   . Diabetes Cousin   . Other Maternal Grandfather     lung transplant   Social History  Substance Use Topics  . Smoking status: Current Some Day Smoker -- 0.50 packs/day for 9 years    Types: Cigarettes  . Smokeless tobacco: Never Used     Comment: 1-2 cig per week  . Alcohol Use: 0.0 oz/week    0 Standard drinks or equivalent per week     Comment: occasionally- once per month    OB History    Gravida Para Term Preterm AB TAB SAB Ectopic Multiple Living   0              Review of Systems  Gastrointestinal: Positive for hematochezia.  All other systems reviewed and are  negative.     Allergies  Bee venom; Dilaudid; Latex; Senna; and Adhesive  Home Medications   Prior to Admission medications   Medication Sig Start Date End Date Taking? Authorizing Provider  albuterol (PROVENTIL HFA;VENTOLIN HFA) 108 (90 BASE) MCG/ACT inhaler Inhale 1-2 puffs into the lungs every 6 (six) hours as needed for wheezing. 04/23/15   Roselyn  Malachy Moan, MD  alprazolam Duanne Moron) 2 MG tablet Take 2 mg by mouth 3 (three) times daily as needed for anxiety (panic attacks).    Historical Provider, MD  ALUVEA 39 % CREA Apply 1 application topically 2 (two) times daily.  03/03/14   Historical Provider, MD  ARIPiprazole (ABILIFY) 9.75 MG/1.3ML injection Inject 400 mg into the muscle every 30 (thirty) days. 20th of the month    Historical Provider, MD  [facility] HFA 200 MCG/ACT AERO Inhale 1 puff into the lungs daily.  05/15/14   Historical Provider, MD  beclomethasone (QVAR) 80 MCG/ACT inhaler Inhale 2 puffs into the lungs as needed (shortness of breath).     Historical Provider, MD  BELSOMRA 15 MG TABS at bedtime.  04/02/15   Historical Provider, MD  cloNIDine (CATAPRES) 0.1 MG tablet Take 0.1 mg by mouth 2 (two) times daily.     Historical Provider, MD  cyclobenzaprine (FLEXERIL) 5 MG tablet Take 1 tablet (5 mg total) by mouth 3 (three) times daily as needed for muscle spasms. Patient taking differently: Take 5 mg by mouth 3 (three) times daily.  11/24/14   Evalee Jefferson, PA-C  Diclofenac Potassium (CAMBIA) 50 MG PACK Take 50 mg by mouth daily as needed (migraines).    Historical Provider, MD  doxepin (SINEQUAN) 50 MG capsule Take 50 mg by mouth 3 (three) times daily.     Historical Provider, MD  DULoxetine (CYMBALTA) 60 MG capsule 60 mg daily.  04/01/15   Historical Provider, MD  EPINEPHrine (EPIPEN) 0.3 mg/0.3 mL SOAJ injection Inject 0.3 mg into the muscle as needed (allergic reaction).    Historical Provider, MD  esomeprazole (NEXIUM) 40 MG capsule TAKE 1 CAPSULE ONCE DAILY AT 12 NOON. 02/23/15    Butch Penny, NP  fluconazole (DIFLUCAN) 150 MG tablet Take 150 mg by mouth as needed.  03/30/15   Historical Provider, MD  gemfibrozil (LOPID) 600 MG tablet 600 mg 2 (two) times daily before a meal.  11/11/14   Historical Provider, MD  ibuprofen (ADVIL,MOTRIN) 800 MG tablet Take 800 mg by mouth as needed.  03/30/15   Historical Provider, MD  lubiprostone (AMITIZA) 24 MCG capsule Take 1 capsule (24 mcg total) by mouth daily with breakfast. 03/31/15   Rogene Houston, MD  medroxyPROGESTERone (DEPO-PROVERA) 150 MG/ML injection Inject 1 mL (150 mg total) into the muscle every 3 (three) months. 08/26/14   Estill Dooms, NP  megestrol (MEGACE) 40 MG tablet 3x5d, 2x5d, then 1 daily to help control vaginal bleeding. Stop taking when bleeding stops. Patient taking differently: 3x5d, 2x5d, then 1 daily to help control vaginal bleeding prn. Stop taking when bleeding stops. 03/03/15   Roma Schanz, CNM  naproxen (NAPROSYN) 500 MG tablet Take 500 mg by mouth as needed.  01/09/15   Historical Provider, MD  NASONEX 50 MCG/ACT nasal spray Place 1 spray into both nostrils as needed (allergies).  05/13/14   Historical Provider, MD  nystatin (MYCOSTATIN/NYSTOP) 100000 UNIT/GM POWD Apply 1 g topically See admin instructions. Apply two-three times a day to affected area as needed 08/26/14   Estill Dooms, NP  Olopatadine HCl (PATADAY) 0.2 % SOLN Place 1 drop into both eyes daily as needed (itchy eyes).    Historical Provider, MD  pimecrolimus (ELIDEL) 1 % cream Apply 1 application topically 2 (two) times daily as needed (facial rash).    Historical Provider, MD  prednisoLONE acetate (PRED FORTE) 1 % ophthalmic suspension Place 2 drops into the right eye as  needed.  02/23/15   Historical Provider, MD  promethazine (PHENERGAN) 25 MG tablet Take 1 tablet (25 mg total) by mouth every 6 (six) hours as needed. Patient taking differently: Take 25 mg by mouth every 6 (six) hours as needed for nausea.  08/07/14   Fredia Sorrow, MD  propranolol (INDERAL) 10 MG tablet Take 10 mg by mouth 3 (three) times daily.    Historical Provider, MD   BP 110/76 mmHg  Pulse 78  Temp(Src) 97.8 F (36.6 C) (Oral)  Resp 20  Ht 5\' 6"  (1.676 m)  Wt 210 lb (95.255 kg)  BMI 33.91 kg/m2  SpO2 100% Physical Exam  Constitutional: She appears well-developed and well-nourished. No distress.  HENT:  Head: Normocephalic and atraumatic.  Mouth/Throat: Oropharynx is clear and moist. No oropharyngeal exudate.  Eyes: Conjunctivae and EOM are normal. Pupils are equal, round, and reactive to light. Right eye exhibits no discharge. Left eye exhibits no discharge. No scleral icterus.  Neck: Normal range of motion. Neck supple. No JVD present. No thyromegaly present.  Cardiovascular: Normal rate, regular rhythm, normal heart sounds and intact distal pulses.  Exam reveals no gallop and no friction rub.   No murmur heard. Pulmonary/Chest: Effort normal and breath sounds normal. No respiratory distress. She has no wheezes. She has no rales.  Abdominal: Soft. Bowel sounds are normal. She exhibits no distension and no mass. There is tenderness (minimal periumbilical and epigastric tenderness, otherwise very soft, no masses, no guarding, no peritoneal signs).  Genitourinary:  Chaperone present for exam, no fissures, external or internal masses / hemorrhoids - ttp with internal exam though non focal, no fluctuance.  Mucous in rectal vault  Musculoskeletal: Normal range of motion. She exhibits no edema or tenderness.  Lymphadenopathy:    She has no cervical adenopathy.  Neurological: She is alert. Coordination normal.  Skin: Skin is warm and dry. No rash noted. No erythema.  Psychiatric: She has a normal mood and affect. Her behavior is normal.  Nursing note and vitals reviewed.   ED Course  Procedures (including critical care time) Labs Review Labs Reviewed  POC OCCULT BLOOD, ED - Abnormal; Notable for the following:    Fecal Occult  Bld POSITIVE (*)    All other components within normal limits  CBC WITH DIFFERENTIAL/PLATELET  COMPREHENSIVE METABOLIC PANEL    Imaging Review No results found. I have personally reviewed and evaluated these images and lab results as part of my medical decision-making.    MDM   Final diagnoses:  Lower GI bleed    Source of bleeding likely hemorrhoids - she has no hx of IBD, she has normal VS and normal Hgb, can continue her meds as prescribed by her GI specialist and can f/u within 2 weeks -  Pt given copy of her results - in agreement with plan.    Noemi Chapel, MD 06/21/15 [phone]

## 2015-07-13 ENCOUNTER — Encounter (INDEPENDENT_AMBULATORY_CARE_PROVIDER_SITE_OTHER): Payer: Self-pay | Admitting: Internal Medicine

## 2015-08-04 ENCOUNTER — Other Ambulatory Visit: Payer: Self-pay | Admitting: Adult Health

## 2015-08-19 ENCOUNTER — Ambulatory Visit: Payer: BLUE CROSS/BLUE SHIELD

## 2015-08-19 ENCOUNTER — Encounter: Payer: Self-pay | Admitting: *Deleted

## 2015-08-19 ENCOUNTER — Ambulatory Visit (INDEPENDENT_AMBULATORY_CARE_PROVIDER_SITE_OTHER): Payer: BLUE CROSS/BLUE SHIELD | Admitting: *Deleted

## 2015-08-19 DIAGNOSIS — Z3202 Encounter for pregnancy test, result negative: Secondary | ICD-10-CM | POA: Diagnosis not present

## 2015-08-19 DIAGNOSIS — Z3042 Encounter for surveillance of injectable contraceptive: Secondary | ICD-10-CM | POA: Diagnosis not present

## 2015-08-19 LAB — POCT URINE PREGNANCY: Preg Test, Ur: NEGATIVE

## 2015-08-19 MED ORDER — MEDROXYPROGESTERONE ACETATE 150 MG/ML IM SUSP
150.0000 mg | Freq: Once | INTRAMUSCULAR | Status: AC
Start: 2015-08-19 — End: 2015-08-19
  Administered 2015-08-19: 150 mg via INTRAMUSCULAR

## 2015-08-19 NOTE — Progress Notes (Signed)
Pt here for Depo. Pt tolerated shot well. Return in 12 weeks for next shot. JSY 

## 2015-08-27 ENCOUNTER — Other Ambulatory Visit: Payer: BLUE CROSS/BLUE SHIELD | Admitting: Adult Health

## 2015-09-01 ENCOUNTER — Telehealth (INDEPENDENT_AMBULATORY_CARE_PROVIDER_SITE_OTHER): Payer: Self-pay | Admitting: Internal Medicine

## 2015-09-01 ENCOUNTER — Encounter (INDEPENDENT_AMBULATORY_CARE_PROVIDER_SITE_OTHER): Payer: Self-pay | Admitting: *Deleted

## 2015-09-01 ENCOUNTER — Other Ambulatory Visit (INDEPENDENT_AMBULATORY_CARE_PROVIDER_SITE_OTHER): Payer: Self-pay | Admitting: Internal Medicine

## 2015-09-01 DIAGNOSIS — K625 Hemorrhage of anus and rectum: Secondary | ICD-10-CM

## 2015-09-01 DIAGNOSIS — Z86010 Personal history of colonic polyps: Secondary | ICD-10-CM

## 2015-09-01 NOTE — Telephone Encounter (Signed)
[name] is due to have a colonoscopy around April 2017 and said she's experiencing blood in her stool. If she needs an appointment before the colonoscopy she only wants to see Dr. Laural Golden. I informed her his schedule is out until July but she stressed she doesn't want to see Terri. She's very nervous about the blood in her stool. She's also wondering if she can have a colonoscopy as an uninsured patient (insurance termed February 2017). She'd like a phone call regarding this.  Pt's ph# [phone]  Thank you.

## 2015-09-01 NOTE — Telephone Encounter (Signed)
Per Dr.Rehman - patient has had a Polyp removed at time of previous TCS. Feels that the rectal bleeding is from her hemorrhoids. Plan a Colonoscopy with Propofol. Patient has not been called at this time.

## 2015-09-01 NOTE — Telephone Encounter (Signed)
TCS w propfol sch'd 09/25/15 and preop 10/01/15, patient aware

## 2015-09-17 NOTE — Patient Instructions (Addendum)
Sharon Vazquez  09/17/2015     @PREFPERIOPPHARMACY @   Your procedure is scheduled on 09/25/2015.  Report to [facility] at 9:30 A.M.  Call this number if you have problems the morning of surgery:  [phone]   Remember:  Do not eat food or drink liquids after midnight.  Take these medicines the morning of surgery with A SIP OF WATER Albuterol inhaler (bring with you to hospital), Xanax, Asmanex inhaler, Qvar inhaler, Catapress, Doxepin,  Nexium, Megace, Nasonex, Inderal, Phenergan   Do not wear jewelry, make-up or nail polish. (Spoke to patient in detail about removing piercings)  Do not wear lotions, powders, or perfumes.  You may wear deodorant.  Do not shave 48 hours prior to surgery.  Men may shave face and neck.  Do not bring valuables to the hospital.  [facility] is not responsible for any belongings or valuables.  Contacts, dentures or bridgework may not be worn into surgery.  Leave your suitcase in the car.  After surgery it may be brought to your room.  For patients admitted to the hospital, discharge time will be determined by your treatment team.  Patients discharged the day of surgery will not be allowed to drive home.    Please read over the following fact sheets that you were given. Anesthesia Post-op Instructions     PATIENT INSTRUCTIONS POST-ANESTHESIA  IMMEDIATELY FOLLOWING SURGERY:  Do not drive or operate machinery for the first twenty four hours after surgery.  Do not make any important decisions for twenty four hours after surgery or while taking narcotic pain medications or sedatives.  If you develop intractable nausea and vomiting or a severe headache please notify your doctor immediately.  FOLLOW-UP:  Please make an appointment with your surgeon as instructed. You do not need to follow up with anesthesia unless specifically instructed to do so.  WOUND CARE INSTRUCTIONS (if applicable):  Keep a dry clean dressing on the anesthesia/puncture  wound site if there is drainage.  Once the wound has quit draining you may leave it open to air.  Generally you should leave the bandage intact for twenty four hours unless there is drainage.  If the epidural site drains for more than 36-48 hours please call the anesthesia department.  QUESTIONS?:  Please feel free to call your physician or the hospital operator if you have any questions, and they will be happy to assist you.      Colonoscopy A colonoscopy is an exam to look at the entire large intestine (colon). This exam can help find problems such as tumors, polyps, inflammation, and areas of bleeding. The exam takes about 1 hour.  LET [facility] CARE PROVIDER KNOW ABOUT:   Any allergies you have.  All medicines you are taking, including vitamins, herbs, eye drops, creams, and over-the-counter medicines.  Previous problems you or members of your family have had with the use of anesthetics.  Any blood disorders you have.  Previous surgeries you have had.  Medical conditions you have. RISKS AND COMPLICATIONS  Generally, this is a safe procedure. However, as with any procedure, complications can occur. Possible complications include:  Bleeding.  Tearing or rupture of the colon wall.  Reaction to medicines given during the exam.  Infection (rare). BEFORE THE PROCEDURE   Ask your health care provider about changing or stopping your regular medicines.  You may be prescribed an oral bowel prep. This involves drinking a large amount of medicated liquid, starting the day before your procedure.  The liquid will cause you to have multiple loose stools until your stool is almost clear or light green. This cleans out your colon in preparation for the procedure.  Do not eat or drink anything else once you have started the bowel prep, unless your health care provider tells you it is safe to do so.  Arrange for someone to drive you home after the procedure. PROCEDURE   You will be given  medicine to help you relax (sedative).  You will lie on your side with your knees bent.  A long, flexible tube with a light and camera on the end (colonoscope) will be inserted through the rectum and into the colon. The camera sends video back to a computer screen as it moves through the colon. The colonoscope also releases carbon dioxide gas to inflate the colon. This helps your health care provider see the area better.  During the exam, your health care provider may take a small tissue sample (biopsy) to be examined under a microscope if any abnormalities are found.  The exam is finished when the entire colon has been viewed. AFTER THE PROCEDURE   Do not drive for 24 hours after the exam.  You may have a small amount of blood in your stool.  You may pass moderate amounts of gas and have mild abdominal cramping or bloating. This is caused by the gas used to inflate your colon during the exam.  Ask when your test results will be ready and how you will get your results. Make sure you get your test results.   This information is not intended to replace advice given to you by your health care provider. Make sure you discuss any questions you have with your health care provider.   Document Released: 05/20/2000 Document Revised: 03/13/2013 Document Reviewed: 01/28/2013 Elsevier Interactive Patient Education [employer].

## 2015-09-21 ENCOUNTER — Encounter (HOSPITAL_COMMUNITY)
Admission: RE | Admit: 2015-09-21 | Discharge: 2015-09-21 | Disposition: A | Discharge status: Home / Self Care | Payer: Self-pay | Source: Ambulatory Visit | Attending: Internal Medicine | Admitting: Internal Medicine

## 2015-09-21 ENCOUNTER — Encounter (HOSPITAL_COMMUNITY): Payer: Self-pay

## 2015-09-21 VITALS — HR 87 | Temp 97.7°F | Ht 67.0 in | Wt 210.0 lb

## 2015-09-21 DIAGNOSIS — Z01812 Encounter for preprocedural laboratory examination: Secondary | ICD-10-CM | POA: Insufficient documentation

## 2015-09-21 DIAGNOSIS — K625 Hemorrhage of anus and rectum: Secondary | ICD-10-CM

## 2015-09-21 DIAGNOSIS — Z86010 Personal history of colonic polyps: Secondary | ICD-10-CM

## 2015-09-21 LAB — CBC
HCT: 38.2 % (ref 36.0–46.0)
Hemoglobin: 13.4 g/dL (ref 12.0–15.0)
MCH: 31.2 pg (ref 26.0–34.0)
MCHC: 35.1 g/dL (ref 30.0–36.0)
MCV: 88.8 fL (ref 78.0–100.0)
PLATELETS: 371 10*3/uL (ref 150–400)
RBC: 4.3 MIL/uL (ref 3.87–5.11)
RDW: 14.1 % (ref 11.5–15.5)
WBC: 10.1 10*3/uL (ref 4.0–10.5)

## 2015-09-21 LAB — BASIC METABOLIC PANEL
Anion gap: 9 (ref 5–15)
BUN: 5 mg/dL — AB (ref 6–20)
CALCIUM: 8.6 mg/dL — AB (ref 8.9–10.3)
CO2: 24 mmol/L (ref 22–32)
CREATININE: 0.94 mg/dL (ref 0.44–1.00)
Chloride: 108 mmol/L (ref 101–111)
GFR calc Af Amer: >60 mL/min (ref >60)
Glucose, Bld: 118 mg/dL — ABNORMAL HIGH (ref 65–99)
POTASSIUM: 3.9 mmol/L (ref 3.5–5.1)
SODIUM: 141 mmol/L (ref 135–145)

## 2015-09-21 LAB — HCG, SERUM, QUALITATIVE: PREG SERUM: NEGATIVE

## 2015-09-25 ENCOUNTER — Encounter (HOSPITAL_COMMUNITY)
Admission: RE | Disposition: A | Discharge status: Home / Self Care | Payer: Self-pay | Source: Ambulatory Visit | Attending: Internal Medicine

## 2015-09-25 ENCOUNTER — Encounter (HOSPITAL_COMMUNITY): Payer: Self-pay | Admitting: *Deleted

## 2015-09-25 ENCOUNTER — Ambulatory Visit (HOSPITAL_COMMUNITY)
Admission: RE | Admit: 2015-09-25 | Discharge: 2015-09-25 | Disposition: A | Discharge status: Home / Self Care | Payer: BLUE CROSS/BLUE SHIELD | Source: Ambulatory Visit | Attending: Internal Medicine | Admitting: Internal Medicine

## 2015-09-25 ENCOUNTER — Ambulatory Visit (HOSPITAL_COMMUNITY): Payer: BLUE CROSS/BLUE SHIELD | Admitting: Anesthesiology

## 2015-09-25 DIAGNOSIS — K219 Gastro-esophageal reflux disease without esophagitis: Secondary | ICD-10-CM | POA: Insufficient documentation

## 2015-09-25 DIAGNOSIS — Z86010 Personal history of colonic polyps: Secondary | ICD-10-CM

## 2015-09-25 DIAGNOSIS — Z09 Encounter for follow-up examination after completed treatment for conditions other than malignant neoplasm: Secondary | ICD-10-CM

## 2015-09-25 DIAGNOSIS — M479 Spondylosis, unspecified: Secondary | ICD-10-CM | POA: Insufficient documentation

## 2015-09-25 DIAGNOSIS — E669 Obesity, unspecified: Secondary | ICD-10-CM | POA: Insufficient documentation

## 2015-09-25 DIAGNOSIS — Z1211 Encounter for screening for malignant neoplasm of colon: Secondary | ICD-10-CM | POA: Insufficient documentation

## 2015-09-25 DIAGNOSIS — F319 Bipolar disorder, unspecified: Secondary | ICD-10-CM | POA: Insufficient documentation

## 2015-09-25 DIAGNOSIS — K5730 Diverticulosis of large intestine without perforation or abscess without bleeding: Secondary | ICD-10-CM | POA: Diagnosis not present

## 2015-09-25 DIAGNOSIS — D122 Benign neoplasm of ascending colon: Secondary | ICD-10-CM | POA: Diagnosis not present

## 2015-09-25 DIAGNOSIS — F419 Anxiety disorder, unspecified: Secondary | ICD-10-CM | POA: Insufficient documentation

## 2015-09-25 DIAGNOSIS — K644 Residual hemorrhoidal skin tags: Secondary | ICD-10-CM | POA: Insufficient documentation

## 2015-09-25 DIAGNOSIS — K648 Other hemorrhoids: Secondary | ICD-10-CM | POA: Diagnosis not present

## 2015-09-25 DIAGNOSIS — K625 Hemorrhage of anus and rectum: Secondary | ICD-10-CM | POA: Diagnosis not present

## 2015-09-25 DIAGNOSIS — F909 Attention-deficit hyperactivity disorder, unspecified type: Secondary | ICD-10-CM | POA: Insufficient documentation

## 2015-09-25 DIAGNOSIS — Z79899 Other long term (current) drug therapy: Secondary | ICD-10-CM | POA: Insufficient documentation

## 2015-09-25 DIAGNOSIS — J45909 Unspecified asthma, uncomplicated: Secondary | ICD-10-CM | POA: Insufficient documentation

## 2015-09-25 DIAGNOSIS — I10 Essential (primary) hypertension: Secondary | ICD-10-CM | POA: Insufficient documentation

## 2015-09-25 HISTORY — PX: POLYPECTOMY: SHX5525

## 2015-09-25 HISTORY — PX: COLONOSCOPY WITH PROPOFOL: SHX5780

## 2015-09-25 SURGERY — COLONOSCOPY WITH PROPOFOL
Anesthesia: Monitor Anesthesia Care

## 2015-09-25 MED ORDER — PROPOFOL 10 MG/ML IV BOLUS
INTRAVENOUS | Status: AC
  Filled 2015-09-25: qty 60

## 2015-09-25 MED ORDER — LACTATED RINGERS IV SOLN
INTRAVENOUS | Status: DC
  Administered 2015-09-25: 07:00:00 via INTRAVENOUS

## 2015-09-25 MED ORDER — MIDAZOLAM HCL 2 MG/2ML IJ SOLN
INTRAMUSCULAR | Status: AC
  Filled 2015-09-25: qty 2

## 2015-09-25 MED ORDER — MIDAZOLAM HCL 5 MG/5ML IJ SOLN
INTRAMUSCULAR | Status: DC | PRN
  Administered 2015-09-25: 2 mg via INTRAVENOUS
  Administered 2015-09-25 (×2): 1 mg via INTRAVENOUS

## 2015-09-25 MED ORDER — ONDANSETRON HCL 4 MG/2ML IJ SOLN
INTRAMUSCULAR | Status: AC
Start: 2015-09-25 — End: 2015-09-25
  Filled 2015-09-25: qty 2

## 2015-09-25 MED ORDER — MIDAZOLAM HCL 2 MG/2ML IJ SOLN
INTRAMUSCULAR | Status: AC
  Filled 2015-09-25: qty 4

## 2015-09-25 MED ORDER — FENTANYL CITRATE (PF) 100 MCG/2ML IJ SOLN
INTRAMUSCULAR | Status: AC
Start: 2015-09-25 — End: 2015-09-25
  Filled 2015-09-25: qty 2

## 2015-09-25 MED ORDER — ONDANSETRON HCL 4 MG/2ML IJ SOLN
4.0000 mg | Freq: Once | INTRAMUSCULAR | Status: AC
  Administered 2015-09-25: 4 mg via INTRAVENOUS

## 2015-09-25 MED ORDER — PROPOFOL 500 MG/50ML IV EMUL
INTRAVENOUS | Status: DC | PRN
  Administered 2015-09-25: 125 ug/kg/min via INTRAVENOUS
  Administered 2015-09-25 (×2): via INTRAVENOUS

## 2015-09-25 MED ORDER — HYDROCORTISONE ACETATE 25 MG RE SUPP: 25.0000 mg | Freq: Every day | RECTAL | Status: DC

## 2015-09-25 MED ORDER — ONDANSETRON HCL 4 MG/2ML IJ SOLN
4.0000 mg | Freq: Once | INTRAMUSCULAR | Status: DC | PRN
Start: 2015-09-25 — End: 2015-09-25

## 2015-09-25 MED ORDER — FENTANYL CITRATE (PF) 100 MCG/2ML IJ SOLN: 25.0000 ug | INTRAMUSCULAR | Status: DC | PRN

## 2015-09-25 MED ORDER — FENTANYL CITRATE (PF) 100 MCG/2ML IJ SOLN
25.0000 ug | INTRAMUSCULAR | Status: AC
  Administered 2015-09-25 (×2): 25 ug via INTRAVENOUS

## 2015-09-25 MED ORDER — MIDAZOLAM HCL 2 MG/2ML IJ SOLN
1.0000 mg | INTRAMUSCULAR | Status: DC | PRN
  Administered 2015-09-25: 2 mg via INTRAVENOUS

## 2015-09-25 NOTE — Discharge Instructions (Signed)
Resume usual medications and high fiber diet. Anusol HC suppository one per rectum daily at bedtime for 2 weeks. No driving for 24 hours. Physician will call with biopsy results. Colonoscopy, Care After Refer to this sheet in the next few weeks. These instructions provide you with information on caring for yourself after your procedure. Your health care provider may also give you more specific instructions. Your treatment has been planned according to current medical practices, but problems sometimes occur. Call your health care provider if you have any problems or questions after your procedure. WHAT TO EXPECT AFTER THE PROCEDURE  After your procedure, it is typical to have the following:  A small amount of blood in your stool.  Moderate amounts of gas and mild abdominal cramping or bloating. HOME CARE INSTRUCTIONS  Do not drive, operate machinery, or sign important documents for 24 hours.  You may shower and resume your regular physical activities, but move at a slower pace for the first 24 hours.  Take frequent rest periods for the first 24 hours.  Walk around or put a warm pack on your abdomen to help reduce abdominal cramping and bloating.  Drink enough fluids to keep your urine clear or pale yellow.  You may resume your normal diet as instructed by your health care provider. Avoid heavy or fried foods that are hard to digest.  Avoid drinking alcohol for 24 hours or as instructed by your health care provider.  Only take over-the-counter or prescription medicines as directed by your health care provider.  If a tissue sample (biopsy) was taken during your procedure:  Do not take aspirin or blood thinners for 7 days, or as instructed by your health care provider.  Do not drink alcohol for 7 days, or as instructed by your health care provider.  Eat soft foods for the first 24 hours. SEEK MEDICAL CARE IF: You have persistent spotting of blood in your stool 2-3 days after the  procedure. SEEK IMMEDIATE MEDICAL CARE IF:  You have more than a small spotting of blood in your stool.  You pass large blood clots in your stool.  Your abdomen is swollen (distended).  You have nausea or vomiting.  You have a fever.  You have increasing abdominal pain that is not relieved with medicine.   This information is not intended to replace advice given to you by your health care provider. Make sure you discuss any questions you have with your health care provider.   Document Released: 01/05/2004 Document Revised: 03/13/2013 Document Reviewed: 01/28/2013 Elsevier Interactive Patient Education 2016 [employer]. Diverticulosis Diverticulosis is the condition that develops when small pouches (diverticula) form in the wall of your colon. Your colon, or large intestine, is where water is absorbed and stool is formed. The pouches form when the inside layer of your colon pushes through weak spots in the outer layers of your colon. CAUSES  No one knows exactly what causes diverticulosis. RISK FACTORS Being older than 59. Your risk for this condition increases with age. Diverticulosis is rare in people younger than 40 years. By age 42, almost everyone has it. Eating a low-fiber diet. Being frequently constipated. Being overweight. Not getting enough exercise. Smoking. Taking over-the-counter pain medicines, like aspirin and ibuprofen. SYMPTOMS  Most people with diverticulosis do not have symptoms. DIAGNOSIS  Because diverticulosis often has no symptoms, health care providers often discover the condition during an exam for other colon problems. In many cases, a health care provider will diagnose diverticulosis while using a  flexible scope to examine the colon (colonoscopy). TREATMENT  If you have never developed an infection related to diverticulosis, you may not need treatment. If you have had an infection before, treatment may include: Eating more fruits, vegetables, and  grains. Taking a fiber supplement. Taking a live bacteria supplement (probiotic). Taking medicine to relax your colon. HOME CARE INSTRUCTIONS  Drink at least 6-8 glasses of water each day to prevent constipation. Try not to strain when you have a bowel movement. Keep all follow-up appointments. If you have had an infection before: Increase the fiber in your diet as directed by your health care provider or dietitian. Take a dietary fiber supplement if your health care provider approves. Only take medicines as directed by your health care provider. SEEK MEDICAL CARE IF:  You have abdominal pain. You have bloating. You have cramps. You have not gone to the bathroom in 3 days. SEEK IMMEDIATE MEDICAL CARE IF:  Your pain gets worse. Yourbloating becomes very bad. You have a fever or chills, and your symptoms suddenly get worse. You begin vomiting. You have bowel movements that are bloody or black. MAKE SURE YOU: Understand these instructions. Will watch your condition. Will get help right away if you are not doing well or get worse.   This information is not intended to replace advice given to you by your health care provider. Make sure you discuss any questions you have with your health care provider.   Document Released: 02/18/2004 Document Revised: 05/28/2013 Document Reviewed: 04/17/2013 Elsevier Interactive Patient Education 2016 [employer] High-Fiber Diet Fiber, also called dietary fiber, is a type of carbohydrate found in fruits, vegetables, whole grains, and beans. A high-fiber diet can have many health benefits. Your health care provider may recommend a high-fiber diet to help: Prevent constipation. Fiber can make your bowel movements more regular. Lower your cholesterol. Relieve hemorrhoids, uncomplicated diverticulosis, or irritable bowel syndrome. Prevent overeating as part of a weight-loss plan. Prevent heart disease, type 2 diabetes, and certain cancers. WHAT IS  MY PLAN? The recommended daily intake of fiber includes: 38 grams for men under age 88. 37 grams for men over age 86. 68 grams for women under age 3. 27 grams for women over age 41. You can get the recommended daily intake of dietary fiber by eating a variety of fruits, vegetables, grains, and beans. Your health care provider may also recommend a fiber supplement if it is not possible to get enough fiber through your diet. WHAT DO I NEED TO KNOW ABOUT A HIGH-FIBER DIET? Fiber supplements have not been widely studied for their effectiveness, so it is better to get fiber through food sources. Always check the fiber content on thenutrition facts label of any prepackaged food. Look for foods that contain at least 5 grams of fiber per serving. Ask your dietitian if you have questions about specific foods that are related to your condition, especially if those foods are not listed in the following section. Increase your daily fiber consumption gradually. Increasing your intake of dietary fiber too quickly may cause bloating, cramping, or gas. Drink plenty of water. Water helps you to digest fiber. WHAT FOODS CAN I EAT? Grains Whole-grain breads. Multigrain cereal. Oats and oatmeal. Brown rice. Barley. Bulgur wheat. Hills. Bran muffins. Popcorn. Rye wafer crackers. Vegetables Sweet potatoes. Spinach. Kale. Artichokes. Cabbage. Broccoli. Green peas. Carrots. Squash. Fruits Berries. Pears. Apples. Oranges. Avocados. Prunes and raisins. Dried figs. Meats and Other Protein Sources Navy, kidney, pinto, and soy beans. Split peas. Lentils.  Nuts and seeds. Dairy Fiber-fortified yogurt. Beverages Fiber-fortified soy milk. Fiber-fortified orange juice. Other Fiber bars. The items listed above may not be a complete list of recommended foods or beverages. Contact your dietitian for more options. WHAT FOODS ARE NOT RECOMMENDED? Grains White bread. Pasta made with refined flour. White  rice. Vegetables Fried potatoes. Canned vegetables. Well-cooked vegetables.  Fruits Fruit juice. Cooked, strained fruit. Meats and Other Protein Sources Fatty cuts of meat. Fried Sales executive or fried fish. Dairy Milk. Yogurt. Cream cheese. Sour cream. Beverages Soft drinks. Other Cakes and pastries. Butter and oils. The items listed above may not be a complete list of foods and beverages to avoid. Contact your dietitian for more information. WHAT ARE SOME TIPS FOR INCLUDING HIGH-FIBER FOODS IN MY DIET? Eat a wide variety of high-fiber foods. Make sure that half of all grains consumed each day are whole grains. Replace breads and cereals made from refined flour or white flour with whole-grain breads and cereals. Replace white rice with brown rice, bulgur wheat, or millet. Start the day with a breakfast that is high in fiber, such as a cereal that contains at least 5 grams of fiber per serving. Use beans in place of meat in soups, salads, or pasta. Eat high-fiber snacks, such as berries, raw vegetables, nuts, or popcorn.   This information is not intended to replace advice given to you by your health care provider. Make sure you discuss any questions you have with your health care provider.   Document Released: 05/23/2005 Document Revised: 06/13/2014 Document Reviewed: 11/05/2013 Elsevier Interactive Patient Education [employer].

## 2015-09-25 NOTE — Op Note (Signed)
[facility] Patient Name: Sharon Vazquez Procedure Date: 09/25/2015 7:32 AM MRN: [id number] Date of Birth: 1980/09/20 Attending MD: Hildred Laser , MD CSN: [id number] Age: 35 Admit Type: Outpatient Procedure:                Colonoscopy Indications:              High risk colon cancer surveillance: Personal                            history of sessile serrated colon polyp with                            dysplasia Providers:                Hildred Laser, MD, Lurline Del, RN, Georgeann Oppenheim,                            Technician Referring MD:             Lennon Alstrom. Baucom, PAC Medicines:                Monitored Anesthesia Care Complications:            No immediate complications. Estimated Blood Loss:     Estimated blood loss was minimal. Procedure:                Pre-Anesthesia Assessment:                           - Prior to the procedure, a History and Physical                            was performed, and patient medications and                            allergies were reviewed. The patient's tolerance of                            previous anesthesia was also reviewed. The risks                            and benefits of the procedure and the sedation                            options and risks were discussed with the patient.                            All questions were answered, and informed consent                            was obtained. Prior Anticoagulants: The patient                            last took previous NSAID medication 1 day prior to  the procedure. ASA Grade Assessment: III - A                            patient with severe systemic disease. After                            reviewing the risks and benefits, the patient was                            deemed in satisfactory condition to undergo the                            procedure.                           After obtaining informed consent, the colonoscope         was passed under direct vision. Throughout the                            procedure, the patient's blood pressure, pulse, and                            oxygen saturations were monitored continuously. The                            [id number]) scope was introduced through                            the anus and advanced to the the cecum, identified                            by appendiceal orifice and ileocecal valve. The                            colonoscopy was performed without difficulty. The                            patient tolerated the procedure well. The quality                            of the bowel preparation was excellent. The                            ileocecal valve, appendiceal orifice, and rectum                            were photographed. Scope In: 7:48:48 AM Scope Out: 8:09:53 AM Scope Withdrawal Time: 0 hours 12 minutes 52 seconds  Total Procedure Duration: 0 hours 21 minutes 5 seconds  Findings:      A single medium-mouthed diverticulum was found in the cecum.      A 4 mm polyp was found in the ascending colon. The polyp was sessile.       The polyp was removed with a cold snare. Resection and retrieval were  complete.      External and internal hemorrhoids were found during retroflexion. The       hemorrhoids were small. Impression:               - Diverticulosis in the cecum.                           - One 4 mm polyp in the ascending colon, removed                            with a cold snare. Resected and retrieved.                           - External and internal hemorrhoids. Moderate Sedation:      Per Anesthesia Care Recommendation:           - Patient has a contact number available for                            emergencies. The signs and symptoms of potential                            delayed complications were discussed with the                            patient. Return to normal activities tomorrow.                             Written discharge instructions were provided to the                            patient.                           - High fiber diet today.                           - Continue present medications.                           - Await pathology results.                           - Repeat colonoscopy in 5 years for surveillance.                           - Anusol (pramoxine) suppository: Insert rectally                            daily for 2 weeks. Procedure Code(s):        --- Professional ---                           [phone], Colonoscopy, flexible; with removal of                            tumor(s), polyp(s), or other  lesion(s) by snare                            technique Diagnosis Code(s):        --- Professional ---                           Z86.010, Personal history of colonic polyps                           K64.8, Other hemorrhoids                           D12.2, Benign neoplasm of ascending colon                           K57.30, Diverticulosis of large intestine without                            perforation or abscess without bleeding CPT copyright 2016 American [employer]. All rights reserved. The codes documented in this report are preliminary and upon coder review may  be revised to meet current compliance requirements. Hildred Laser, MD Hildred Laser, MD 09/25/2015 8:20:36 AM This report has been signed electronically. Number of Addenda: 0

## 2015-09-25 NOTE — Anesthesia Postprocedure Evaluation (Signed)
Anesthesia Post Note  Patient: Sharon Vazquez  Procedure(s) Performed: Procedure(s) (LRB): COLONOSCOPY WITH PROPOFOL (N/A) POLYPECTOMY  Patient location during evaluation: PACU Anesthesia Type: MAC Level of consciousness: awake, oriented and patient cooperative Pain management: pain level controlled Vital Signs Assessment: post-procedure vital signs reviewed and stable Respiratory status: spontaneous breathing, nonlabored ventilation and respiratory function stable Cardiovascular status: blood pressure returned to baseline Postop Assessment: no signs of nausea or vomiting Anesthetic complications: no    Last Vitals:  Filed Vitals:   09/25/15 0644 09/25/15 0730  BP: 116/80 126/74  Temp: 36.7 C   Resp: 18 18    Last Pain: There were no vitals filed for this visit.               Harrold Fitchett J

## 2015-09-25 NOTE — H&P (Signed)
Sharon Vazquez is an 35 y.o. female.   Chief Complaint: She was here for colonoscopy. HPI: Patient is 35 year old African female was multiple medical problems who underwent diagnostic colonoscopy in March 2014 with removal of 10 mm equal polyp which turned out to be sessile serrated polyp with dysplasia. A she was advised to return for follow-up exam in 3 has. She has multiple complaints. She complains of upper and lower abdominal pain. She's been passing blood with her bowel movements for the last few months. She was seen in the office in December and CBC was normal. She made from 4 days ago was 13.4 and 38.2. Patient says she is not taking pain medication anymore. She is waiting for appointment at pain clinic. She states she has lost 12 pounds recently. In December when she came to office she requested appetite suppressant. History is negative for CRC but significant for Crohn's colitis involving one uncle.   Past Medical History  Diagnosis Date  . Hypertension   . Anxiety   . Bipolar 1 disorder ([place])   . ADHD (attention deficit hyperactivity disorder)   . Borderline personality disorder   . Panic attack   . Panic disorder   . HSV-2 (herpes simplex virus 2) infection   . Hx of chlamydia infection   . IBS (irritable bowel syndrome)   . Abnormal Pap smear   . Constipation     takes Dulcolax daily as needed takes Amitiza daily  . Anxiety     takes Xanax daily as needed  . Back spasm     takes Flexeril daily as needed  . Insomnia     takes Ambien nightly  . Hypertension     takes INderal and Clonidine daily  . Shortness of breath     with exertion  . Asthma     Ventolin as needed and QVAR takes daily  . History of bronchitis     last time 4-36yrs ago  . Weakness     numbness and tingling to left foot  . Joint swelling     right knee  . Chronic back pain     HNP  . Eczema     has 2 creams uses as needed  . GERD (gastroesophageal reflux disease)     takes Tums as needed   . Internal hemorrhoids   . History of colon polyps   . Urinary urgency   . Mental disorder     takes Abilify as needed  . Bipolar 1 disorder ([facility])     takes DOxepin daily  . Obesity   . Contraceptive management 08/23/2013  . Irregular periods 04/02/2014  . Headache(784.0)   . Headache(784.0)     migraines-last one about 68months ago;takes Topamax daily  . Arthritis     degenerative spine    Past Surgical History  Procedure Laterality Date  . Wisdom tooth extraction    . Bunion removal    . Cholecystectomy  10 yrs ago  . Colonoscopy N/A 08/22/2012    Procedure: COLONOSCOPY;  Surgeon: Rogene Houston, MD;  Location: [facility];  Service: Endoscopy;  Laterality: N/A;  100  . Tonsillectomy    . Cholecystectomy  10+yrs ago  . Tonsillectomy      as a child  . Bunionectomy Left     pins in big toe and 2nd toe  . Wisdom teeth extracted    . Epidural injections      x 2  . Colonoscopy    .  Lumbar laminectomy/decompression microdiscectomy Left 07/03/2013    Procedure: LEFT LUMBAR THREE-FOUR microdiskectomy;  Surgeon: Winfield Cunas, MD;  Location: [facility];  Service: Neurosurgery;  Laterality: Left;  LEFT LUMBAR THREE-FOUR microdiskectomy  . Lumbar laminectomy/decompression microdiscectomy Left 11/29/2013    Procedure: LEFT Lumbar Four-Five Redo microdiskectomy;  Surgeon: Winfield Cunas, MD;  Location: [facility];  Service: Neurosurgery;  Laterality: Left;  LEFT Lumbar Four-Five Redo microdiskectomy  . Back surgery    . Spinal cord stimulator trial N/A 04/23/2014    Procedure: LUMBAR SPINAL CORD STIMULATOR TRIAL;  Surgeon: Ashok Pall, MD;  Location: [facility];  Service: Neurosurgery;  Laterality: N/A;  Spinal Cord Stimulator Trial  . Spinal cord stimulator insertion N/A 06/13/2014    Procedure: LUMBAR SPINAL CORD STIMULATOR INSERTION;  Surgeon: Ashok Pall, MD;  Location: [facility];  Service: Neurosurgery;  Laterality: N/A;  permanent spinal cord stimulator insertion  .  Spinal cord stimulator removal N/A 12/17/2014    Procedure: THORACIC SPINAL CORD STIMULATOR REMOVAL;  Surgeon: Ashok Pall, MD;  Location: [facility];  Service: Neurosurgery;  Laterality: N/A;  THORACIC SPINAL CORD STIMULATOR REMOVAL    Family History  Problem Relation Age of Onset  . Diabetes Mother   . Thyroid disease Mother   . Other Mother     PTSD  . Hyperlipidemia Mother   . Hypertension Maternal Aunt   . Diabetes Maternal Aunt   . Thyroid disease Maternal Aunt   . Diabetes Maternal Grandmother   . Heart disease Maternal Grandmother     CHF  . Hypertension Maternal Grandmother   . Dementia Maternal Grandmother   . Diabetes Father   . Hypertension Father   . Obesity Father   . Cancer Paternal Grandmother     breast  . Alcohol abuse Paternal Grandmother   . Crohn's disease Maternal Uncle   . Hyperlipidemia Maternal Uncle   . Other Maternal Uncle     back pain  . Dementia Maternal Uncle   . Diabetes Cousin   . Other Maternal Grandfather     lung transplant   Social History:  reports that she has been smoking Cigarettes.  She has a 4.5 pack-year smoking history. She has never used smokeless tobacco. She reports that she drinks alcohol. She reports that she does not use illicit drugs.  Allergies:  Allergies  Allergen Reactions  . Bee Venom Anaphylaxis  . Dilaudid [Hydromorphone Hcl] Anaphylaxis and Hives    Has tolerated morphine since this reaction  . Latex Hives  . Senna Anaphylaxis and Hives  . Adhesive [Tape] Hives and Other (See Comments)    Pulls skin off (use paper tape)    Medications Prior to Admission  Medication Sig Dispense Refill  . albuterol (PROVENTIL HFA;VENTOLIN HFA) 108 (90 BASE) MCG/ACT inhaler Inhale 1-2 puffs into the lungs every 6 (six) hours as needed for wheezing. 1 Inhaler 0  . ALPRAZolam (XANAX XR) 3 MG 24 hr tablet Take 3 mg by mouth 2 (two) times daily.    Marland Kitchen ALUVEA 39 % CREA Apply 1 application topically 2 (two) times daily.     Darlin Priestly HFA 200 MCG/ACT AERO Inhale 1 puff into the lungs daily.   3  . beclomethasone (QVAR) 80 MCG/ACT inhaler Inhale 2 puffs into the lungs as needed (shortness of breath).     . cloNIDine (CATAPRES) 0.1 MG tablet Take 0.1 mg by mouth 2 (two) times daily.     . Diclofenac Potassium (CAMBIA) 50 MG  PACK Take 50 mg by mouth daily as needed (migraines).    . doxepin (SINEQUAN) 50 MG capsule Take 50 mg by mouth 3 (three) times daily.     . DULoxetine (CYMBALTA) 60 MG capsule Take 60 mg by mouth daily.   3  . esomeprazole (NEXIUM) 40 MG capsule TAKE 1 CAPSULE ONCE DAILY AT 12 NOON. 30 capsule 5  . gemfibrozil (LOPID) 600 MG tablet 600 mg 2 (two) times daily before a meal.   4  . lubiprostone (AMITIZA) 24 MCG capsule Take 1 capsule (24 mcg total) by mouth daily with breakfast. 30 capsule 5  . medroxyPROGESTERone (DEPO-PROVERA) 150 MG/ML injection INJECT 1 VIAL INTRAMUSCULARLY EVERY 3 MONTHS IN OFFICE. 1 mL 0  . megestrol (MEGACE) 40 MG tablet 3x5d, 2x5d, then 1 daily to help control vaginal bleeding. Stop taking when bleeding stops. (Patient taking differently: Take 40 mg by mouth daily as needed (bleeding). 3x5d, 2x5d, then 1 daily to help control vaginal bleeding prn. Stop taking when bleeding stops.) 45 tablet 1  . NASONEX 50 MCG/ACT nasal spray Place 1 spray into both nostrils as needed (allergies).   3  . nystatin (MYCOSTATIN/NYSTOP) 100000 UNIT/GM POWD Apply 1 g topically See admin instructions. Apply two-three times a day to affected area as needed 30 g 2  . Olopatadine HCl (PATADAY) 0.2 % SOLN Place 1 drop into both eyes daily as needed (itchy eyes).    . pimecrolimus (ELIDEL) 1 % cream Apply 1 application topically 2 (two) times daily as needed (facial rash).    . prednisoLONE acetate (PRED FORTE) 1 % ophthalmic suspension Place 2 drops into the right eye as needed.   0  . promethazine (PHENERGAN) 25 MG tablet Take 1 tablet (25 mg total) by mouth every 6 (six) hours as needed. (Patient taking  differently: Take 25 mg by mouth every 6 (six) hours as needed for nausea. ) 12 tablet 1  . propranolol (INDERAL) 10 MG tablet Take 10 mg by mouth 3 (three) times daily.    Marland Kitchen zolpidem (AMBIEN CR) 12.5 MG CR tablet 12.5 mg at bedtime as needed for sleep.   3  . EPINEPHrine (EPIPEN) 0.3 mg/0.3 mL SOAJ injection Inject 0.3 mg into the muscle as needed (allergic reaction).      No results found for this or any previous visit (from the past 48 hour(s)). No results found.  ROS  Blood pressure 116/80, temperature 98 F (36.7 C), temperature source Oral, resp. rate 18, SpO2 99 %. Physical Exam  Constitutional: She appears well-developed and well-nourished.  HENT:  Mouth/Throat: Oropharynx is clear and moist.  Eyes: Conjunctivae are normal. No scleral icterus.  Neck: No thyromegaly present.  Cardiovascular: Normal rate and regular rhythm.   No murmur heard. Respiratory: Effort normal and breath sounds normal.  GI:  Abdomen is full and soft with mild tenderness at epigastrium. No organomegaly or masses.  Musculoskeletal: She exhibits no edema.  Lymphadenopathy:    She has no cervical adenopathy.  Neurological: She is alert.  Skin: Skin is warm and dry.  She has multiple tattoos including one on right side of her neck.     Assessment/Plan Recurrent hematochezia possibly secondary to hemorrhoids. History of sessile serrated polyp with dysplasia. Surveillance colonoscopy under monitored anesthesia care.  Rogene Houston, MD 09/25/2015, 7:19 AM

## 2015-09-25 NOTE — Anesthesia Preprocedure Evaluation (Signed)
Anesthesia Evaluation  Patient identified by MRN, date of birth, ID band Patient awake    Reviewed: Allergy & Precautions, NPO status , Patient's Chart, lab work & pertinent test results  History of Anesthesia Complications Negative for: history of anesthetic complications  Airway Mallampati: I  TM Distance: >3 FB Neck ROM: Full    Dental  (+) Poor Dentition, Dental Advisory Given   Pulmonary shortness of breath, asthma , COPD,  COPD inhaler, Current Smoker,    breath sounds clear to auscultation       Cardiovascular hypertension, Pt. on medications and Pt. on home beta blockers (-) angina Rhythm:Regular Rate:Normal     Neuro/Psych  Headaches, PSYCHIATRIC DISORDERS Anxiety Depression Bipolar Disorder Chronic back pain  Neuromuscular disease    GI/Hepatic Neg liver ROS, GERD  Medicated and Controlled,  Endo/Other  Morbid obesity  Renal/GU negative Renal ROS     Musculoskeletal  (+) Arthritis , Osteoarthritis,    Abdominal (+) + obese,   Peds  Hematology negative hematology ROS (+)   Anesthesia Other Findings   Reproductive/Obstetrics Depo, LMP 1 week ago                             Anesthesia Physical Anesthesia Plan  ASA: III  Anesthesia Plan: MAC   Post-op Pain Management:    Induction: Intravenous  Airway Management Planned: Simple Face Mask  Additional Equipment:   Intra-op Plan:   Post-operative Plan:   Informed Consent: I have reviewed the patients History and Physical, chart, labs and discussed the procedure including the risks, benefits and alternatives for the proposed anesthesia with the patient or authorized representative who has indicated his/her understanding and acceptance.     Plan Discussed with:   Anesthesia Plan Comments:         Anesthesia Quick Evaluation

## 2015-09-25 NOTE — Transfer of Care (Signed)
Immediate Anesthesia Transfer of Care Note  Patient: Sharon Vazquez  Procedure(s) Performed: Procedure(s) with comments: COLONOSCOPY WITH PROPOFOL (N/A) - 2:30-moved to 7:30 Ann notified pt POLYPECTOMY - at cecum  Patient Location: PACU  Anesthesia Type:MAC  Level of Consciousness: awake and patient cooperative  Airway & Oxygen Therapy: Patient Spontanous Breathing and Patient connected to face mask oxygen  Post-op Assessment: Report given to RN, Post -op Vital signs reviewed and stable and Patient moving all extremities  Post vital signs: Reviewed and stable  Last Vitals:  Filed Vitals:   09/25/15 0644 09/25/15 0730  BP: 116/80 126/74  Temp: 36.7 C   Resp: 18 18    Complications: No apparent anesthesia complications

## 2015-09-29 ENCOUNTER — Encounter (HOSPITAL_COMMUNITY): Payer: Self-pay | Admitting: Internal Medicine

## 2015-11-12 ENCOUNTER — Encounter: Payer: Self-pay | Admitting: *Deleted

## 2015-11-12 ENCOUNTER — Ambulatory Visit (INDEPENDENT_AMBULATORY_CARE_PROVIDER_SITE_OTHER): Payer: Medicaid Other | Admitting: *Deleted

## 2015-11-12 DIAGNOSIS — Z308 Encounter for other contraceptive management: Secondary | ICD-10-CM | POA: Diagnosis not present

## 2015-11-12 DIAGNOSIS — Z3042 Encounter for surveillance of injectable contraceptive: Secondary | ICD-10-CM

## 2015-11-12 DIAGNOSIS — Z3202 Encounter for pregnancy test, result negative: Secondary | ICD-10-CM

## 2015-11-12 LAB — POCT URINE PREGNANCY: PREG TEST UR: NEGATIVE

## 2015-11-12 MED ORDER — MEDROXYPROGESTERONE ACETATE 150 MG/ML IM SUSP
150.0000 mg | Freq: Once | INTRAMUSCULAR | Status: AC
  Administered 2015-11-12: 150 mg via INTRAMUSCULAR

## 2015-11-12 NOTE — Progress Notes (Signed)
Pt here for Depo. Pt tolerated shot well. Return in 12 weeks for next shot. JSY 

## 2015-11-17 ENCOUNTER — Ambulatory Visit (INDEPENDENT_AMBULATORY_CARE_PROVIDER_SITE_OTHER): Payer: BLUE CROSS/BLUE SHIELD | Admitting: Internal Medicine

## 2015-11-20 ENCOUNTER — Telehealth (INDEPENDENT_AMBULATORY_CARE_PROVIDER_SITE_OTHER): Payer: Self-pay | Admitting: *Deleted

## 2015-11-20 NOTE — Telephone Encounter (Signed)
Patient presented to the office this morning requesting samples of Amitiza. She states that she is having to take 2 of the 24 mcg Amitiza for results the 1 just doesn't work. This morning she had a BM and saw blood. She said that she had to strain a little bit. Patient says that this has been going on since her Colonscopy in November.

## 2015-11-23 NOTE — Telephone Encounter (Signed)
Noted  

## 2015-11-23 NOTE — Telephone Encounter (Signed)
She just had colonoscopy. Given her constipation bleeding is most likely secondary to hemorrhoids. No further workup at this time. Can take Amitiza 24 g twice daily.

## 2015-11-24 ENCOUNTER — Telehealth (INDEPENDENT_AMBULATORY_CARE_PROVIDER_SITE_OTHER): Payer: Self-pay | Admitting: Internal Medicine

## 2015-11-24 ENCOUNTER — Emergency Department (HOSPITAL_COMMUNITY)
Admission: EM | Admit: 2015-11-24 | Discharge: 2015-11-24 | Disposition: A | Discharge status: Home / Self Care | Payer: Self-pay | Attending: Emergency Medicine | Admitting: Emergency Medicine

## 2015-11-24 ENCOUNTER — Encounter (INDEPENDENT_AMBULATORY_CARE_PROVIDER_SITE_OTHER): Payer: Self-pay | Admitting: Internal Medicine

## 2015-11-24 ENCOUNTER — Emergency Department (HOSPITAL_COMMUNITY): Payer: Self-pay

## 2015-11-24 ENCOUNTER — Encounter (HOSPITAL_COMMUNITY): Payer: Self-pay | Admitting: Emergency Medicine

## 2015-11-24 ENCOUNTER — Ambulatory Visit (INDEPENDENT_AMBULATORY_CARE_PROVIDER_SITE_OTHER): Payer: Medicaid Other | Admitting: Internal Medicine

## 2015-11-24 VITALS — BP 130/80 | HR 68 | Temp 98.5°F | Resp 20 | Ht 67.0 in | Wt 195.5 lb

## 2015-11-24 DIAGNOSIS — J45909 Unspecified asthma, uncomplicated: Secondary | ICD-10-CM | POA: Insufficient documentation

## 2015-11-24 DIAGNOSIS — R1030 Lower abdominal pain, unspecified: Secondary | ICD-10-CM

## 2015-11-24 DIAGNOSIS — Z6830 Body mass index (BMI) 30.0-30.9, adult: Secondary | ICD-10-CM | POA: Insufficient documentation

## 2015-11-24 DIAGNOSIS — R109 Unspecified abdominal pain: Secondary | ICD-10-CM

## 2015-11-24 DIAGNOSIS — I10 Essential (primary) hypertension: Secondary | ICD-10-CM | POA: Insufficient documentation

## 2015-11-24 DIAGNOSIS — K5909 Other constipation: Secondary | ICD-10-CM

## 2015-11-24 DIAGNOSIS — K5900 Constipation, unspecified: Secondary | ICD-10-CM

## 2015-11-24 DIAGNOSIS — F17210 Nicotine dependence, cigarettes, uncomplicated: Secondary | ICD-10-CM | POA: Insufficient documentation

## 2015-11-24 DIAGNOSIS — Z79899 Other long term (current) drug therapy: Secondary | ICD-10-CM | POA: Insufficient documentation

## 2015-11-24 DIAGNOSIS — M1990 Unspecified osteoarthritis, unspecified site: Secondary | ICD-10-CM | POA: Insufficient documentation

## 2015-11-24 DIAGNOSIS — E669 Obesity, unspecified: Secondary | ICD-10-CM | POA: Insufficient documentation

## 2015-11-24 DIAGNOSIS — F319 Bipolar disorder, unspecified: Secondary | ICD-10-CM | POA: Insufficient documentation

## 2015-11-24 LAB — COMPREHENSIVE METABOLIC PANEL
ALBUMIN: 4.3 g/dL (ref 3.5–5.0)
ALK PHOS: 58 U/L (ref 38–126)
ALT: 16 U/L (ref 14–54)
ANION GAP: 7 (ref 5–15)
AST: 18 U/L (ref 15–41)
BILIRUBIN TOTAL: 0.5 mg/dL (ref 0.3–1.2)
BUN: 7 mg/dL (ref 6–20)
CALCIUM: 9.1 mg/dL (ref 8.9–10.3)
CO2: 22 mmol/L (ref 22–32)
CREATININE: 0.84 mg/dL (ref 0.44–1.00)
Chloride: 107 mmol/L (ref 101–111)
GFR calc Af Amer: >60 mL/min (ref >60)
GFR calc non Af Amer: >60 mL/min (ref >60)
GLUCOSE: 86 mg/dL (ref 65–99)
Potassium: 4.3 mmol/L (ref 3.5–5.1)
Sodium: 136 mmol/L (ref 135–145)
TOTAL PROTEIN: 7.7 g/dL (ref 6.5–8.1)

## 2015-11-24 LAB — URINE MICROSCOPIC-ADD ON: WBC UA: NONE SEEN WBC/hpf (ref 0–5)

## 2015-11-24 LAB — URINALYSIS, ROUTINE W REFLEX MICROSCOPIC
BILIRUBIN URINE: NEGATIVE
GLUCOSE, UA: NEGATIVE mg/dL
KETONES UR: NEGATIVE mg/dL
Leukocytes, UA: NEGATIVE
Nitrite: NEGATIVE
PH: 5.5 (ref 5.0–8.0)
PROTEIN: NEGATIVE mg/dL
SPECIFIC GRAVITY, URINE: 1.01 (ref 1.005–1.030)

## 2015-11-24 LAB — CBC
HCT: 40.5 % (ref 36.0–46.0)
Hemoglobin: 14.5 g/dL (ref 12.0–15.0)
MCH: 31.3 pg (ref 26.0–34.0)
MCHC: 35.8 g/dL (ref 30.0–36.0)
MCV: 87.3 fL (ref 78.0–100.0)
PLATELETS: 413 10*3/uL — AB (ref 150–400)
RBC: 4.64 MIL/uL (ref 3.87–5.11)
RDW: 13.9 % (ref 11.5–15.5)
WBC: 11.2 10*3/uL — ABNORMAL HIGH (ref 4.0–10.5)

## 2015-11-24 LAB — LIPASE, BLOOD: Lipase: 24 U/L (ref 11–51)

## 2015-11-24 LAB — PREGNANCY, URINE: Preg Test, Ur: NEGATIVE

## 2015-11-24 MED ORDER — DICYCLOMINE HCL 10 MG PO CAPS: 10.0000 mg | ORAL_CAPSULE | Freq: Three times a day (TID) | ORAL | Status: DC

## 2015-11-24 MED ORDER — DICYCLOMINE HCL 10 MG PO CAPS
10.0000 mg | ORAL_CAPSULE | Freq: Once | ORAL | Status: AC
  Administered 2015-11-24: 10 mg via ORAL
  Filled 2015-11-24: qty 1

## 2015-11-24 MED ORDER — IOPAMIDOL (ISOVUE-300) INJECTION 61%
100.0000 mL | Freq: Once | INTRAVENOUS | Status: AC | PRN
  Administered 2015-11-24: 100 mL via INTRAVENOUS

## 2015-11-24 MED ORDER — DIATRIZOATE MEGLUMINE & SODIUM 66-10 % PO SOLN
ORAL | Status: AC
  Filled 2015-11-24: qty 30

## 2015-11-24 NOTE — ED Notes (Signed)
Pt ambulated to bathroom independently

## 2015-11-24 NOTE — Telephone Encounter (Signed)
Patient called to let us know that she just left the ER.  She stated that gave her something and told her to follow up with [place] for abd pain.  Does the patient need to come in on a prn basis or when does she need to come in?  [phone]

## 2015-11-24 NOTE — ED Notes (Signed)
PT c/o abdominal pain (cramping) with constipation and sharp pains going on for over 6 months. PT denies any n/v/d. PT denies any urinary symptoms.

## 2015-11-24 NOTE — Discharge Instructions (Signed)

## 2015-11-24 NOTE — Progress Notes (Signed)
Presenting complaint;  Severe lower abdominal pain and constipation.  Subjective:  Patient is 35 year old American female multiple medical problems including chronic constipation who called office last week requesting samples of Amitiza. She stated 2 doses of Amitiza worked. He also indicated that she passed large caliber stool and saw blood. She now presents with severe pain across her lower abdomen. She has had this pain since last night. She states she's only had 2 bowel movements since her colonoscopy 2 months ago. Her appetite has been normal and she had regular meal last evening. She did not eat breakfast. She had a few sips of coffee along with her medications. She has been passing flatus. She denies nausea vomiting fever or chills. She states she has tried an enema as an other OTC medications and they have not helped. She has lost 13 pounds in the last 6 months.  Current Medications: Outpatient Encounter Prescriptions as of 11/24/2015  Medication Sig  . albuterol (PROVENTIL HFA;VENTOLIN HFA) 108 (90 BASE) MCG/ACT inhaler Inhale 1-2 puffs into the lungs every 6 (six) hours as needed for wheezing.  Marland Kitchen ALPRAZolam (XANAX XR) 3 MG 24 hr tablet Take 3 mg by mouth 2 (two) times daily.  Marland Kitchen ALUVEA 39 % CREA Apply 1 application topically 2 (two) times daily.   Darlin Priestly HFA 200 MCG/ACT AERO Inhale 1 puff into the lungs daily.   . beclomethasone (QVAR) 80 MCG/ACT inhaler Inhale 2 puffs into the lungs as needed (shortness of breath).   . cloNIDine (CATAPRES) 0.1 MG tablet Take 0.1 mg by mouth 2 (two) times daily.   . Diclofenac Potassium (CAMBIA) 50 MG PACK Take 50 mg by mouth daily as needed (migraines).  . doxepin (SINEQUAN) 50 MG capsule Take 50 mg by mouth 3 (three) times daily.   . DULoxetine (CYMBALTA) 60 MG capsule Take 60 mg by mouth daily.   Marland Kitchen esomeprazole (NEXIUM) 40 MG capsule TAKE 1 CAPSULE ONCE DAILY AT 12 NOON.  Marland Kitchen gemfibrozil (LOPID) 600 MG tablet 600 mg 2 (two) times daily before a  meal.   . lubiprostone (AMITIZA) 24 MCG capsule Take 1 capsule (24 mcg total) by mouth daily with breakfast.  . Magnesium Hydroxide (PHILLIPS MILK OF MAGNESIA PO) Take by mouth. Patient states that she took 2 tablespoons this morning.  . medroxyPROGESTERone (DEPO-PROVERA) 150 MG/ML injection INJECT 1 VIAL INTRAMUSCULARLY EVERY 3 MONTHS IN OFFICE.  . megestrol (MEGACE) 40 MG tablet 3x5d, 2x5d, then 1 daily to help control vaginal bleeding. Stop taking when bleeding stops. (Patient taking differently: Take 40 mg by mouth daily as needed (bleeding). 3x5d, 2x5d, then 1 daily to help control vaginal bleeding prn. Stop taking when bleeding stops.)  . NASONEX 50 MCG/ACT nasal spray Place 1 spray into both nostrils as needed (allergies).   . nystatin (MYCOSTATIN/NYSTOP) 100000 UNIT/GM POWD Apply 1 g topically See admin instructions. Apply two-three times a day to affected area as needed  . Olopatadine HCl (PATADAY) 0.2 % SOLN Place 1 drop into both eyes daily as needed (itchy eyes).  . pimecrolimus (ELIDEL) 1 % cream Apply 1 application topically 2 (two) times daily as needed (facial rash).  . promethazine (PHENERGAN) 25 MG tablet Take 1 tablet (25 mg total) by mouth every 6 (six) hours as needed. (Patient taking differently: Take 25 mg by mouth every 6 (six) hours as needed for nausea. )  . propranolol (INDERAL) 10 MG tablet Take 10 mg by mouth 3 (three) times daily.  Marland Kitchen zolpidem (AMBIEN CR) 12.5 MG CR tablet  12.5 mg at bedtime as needed for sleep.   Marland Kitchen EPINEPHrine (EPIPEN) 0.3 mg/0.3 mL SOAJ injection Inject 0.3 mg into the muscle as needed (allergic reaction). Reported on 11/24/2015  . hydrocortisone (ANUSOL-HC) 25 MG suppository Place 1 suppository (25 mg total) rectally at bedtime. (Patient not taking: Reported on 11/24/2015)  . [DISCONTINUED] prednisoLONE acetate (PRED FORTE) 1 % ophthalmic suspension Place 2 drops into the right eye as needed. Reported on 11/24/2015   No facility-administered encounter  medications on file as of 11/24/2015.     Objective: Blood pressure 130/80, pulse 68, temperature 98.5 F (36.9 C), temperature source Oral, resp. rate 20, height 5\' 7"  (1.702 m), weight 195 lb 8 oz (88.678 kg). Patient is alert and appears to be in pain. Every now and then she is hollering. Conjunctiva is pink. Sclera is nonicteric No neck masses or thyromegaly noted. Abdomen is protuberant. She has ring at umbilicus. Bowel sounds are normal. On palpation abdomen is soft with moderate tenderness at epigastric region and tenderness across lower abdomen without guarding. Gano megaly or masses. No LE edema or clubbing noted.   Assessment:  #1. Daily onset of abdominal pain in patient with chronic constipation. Patient is hollering in pain her abdomen is not rigid. Last colonoscopy was 2 months ago with removal of a small adenoma. She had large sessile serrated polyp removed 3 years earlier. She is also tender in epigastric region. Patient needs to be evaluated in emergency room to make sure she does not have appendicitis or other causes of abdominal pain.  Plan:  Patient advised to go to emergency room. ER called and message left for Dr. Colin Broach is busy running the code).  For any questions I can be paged at [phone].

## 2015-11-24 NOTE — ED Provider Notes (Signed)
CSN: [id number]     Arrival date & time 11/24/15  1024 History  By signing my name below, I, Rosario Adie, attest that this documentation has been prepared under the direction and in the presence of Azalia Bilis, MD.  Electronically Signed: Rosario Adie, ED Scribe. 11/24/2015. 12:17 PM.   Chief Complaint  Patient presents with  . Abdominal Pain   HPI HPI Comments: Sharon Vazquez is a 35 y.o. female with a PMHx of HTN, IBS, and GERD who presents to the Emergency Department complaining of gradual onset, gradually worsening, cramping and shooting, 8/10, lower abdominal pain onset over 6 months ago. She has associated constipation. Pt has tried enemas and laxatives intermittently with no relief of her pain or constipation. She also states that she takes Amitiza for her IBS and constipation, but it has not relieved her sx. She has had multiple surgeries to her back and abdomen for her symptoms and pain, including a cholecystectomy and multiple colonoscopys.She states that she hasn't had a normal bowel movement for over 2 months, and usually only has one bowel movement a day. She states that she did eat a lot of food 1 day PTA, and she has felt bloated ever since.  She denies nausea, vomiting, diarrhea, dysuria, hematuria, frequency, or urgency at this time.   Past Medical History  Diagnosis Date  . Hypertension   . Anxiety   . Bipolar 1 disorder (HCC)   . ADHD (attention deficit hyperactivity disorder)   . Borderline personality disorder   . Panic attack   . Panic disorder   . HSV-2 (herpes simplex virus 2) infection   . Hx of chlamydia infection   . IBS (irritable bowel syndrome)   . Abnormal Pap smear   . Constipation     takes Dulcolax daily as needed takes Amitiza daily  . Anxiety     takes Xanax daily as needed  . Back spasm     takes Flexeril daily as needed  . Insomnia     takes Ambien nightly  . Hypertension     takes INderal and Clonidine daily  . Shortness  of breath     with exertion  . Asthma     Ventolin as needed and QVAR takes daily  . History of bronchitis     last time 4-91yrs ago  . Weakness     numbness and tingling to left foot  . Joint swelling     right knee  . Chronic back pain     HNP  . Eczema     has 2 creams uses as needed  . GERD (gastroesophageal reflux disease)     takes Tums as needed  . Internal hemorrhoids   . History of colon polyps   . Urinary urgency   . Mental disorder     takes Abilify as needed  . Bipolar 1 disorder (HCC)     takes DOxepin daily  . Obesity   . Contraceptive management 08/23/2013  . Irregular periods 04/02/2014  . Headache(784.0)   . Headache(784.0)     migraines-last one about 56months ago;takes Topamax daily  . Arthritis     degenerative spine   Past Surgical History  Procedure Laterality Date  . Wisdom tooth extraction    . Bunion removal    . Cholecystectomy  10 yrs ago  . Colonoscopy N/A 08/22/2012    Procedure: COLONOSCOPY;  Surgeon: Malissa Hippo, MD;  Location: [facility];  Service: Endoscopy;  Laterality:  N/A;  100  . Tonsillectomy    . Cholecystectomy  10+yrs ago  . Tonsillectomy      as a child  . Bunionectomy Left     pins in big toe and 2nd toe  . Wisdom teeth extracted    . Epidural injections      x 2  . Colonoscopy    . Lumbar laminectomy/decompression microdiscectomy Left 07/03/2013    Procedure: LEFT LUMBAR THREE-FOUR microdiskectomy;  Surgeon: Winfield Cunas, MD;  Location: [place];  Service: Neurosurgery;  Laterality: Left;  LEFT LUMBAR THREE-FOUR microdiskectomy  . Lumbar laminectomy/decompression microdiscectomy Left 11/29/2013    Procedure: LEFT Lumbar Four-Five Redo microdiskectomy;  Surgeon: Winfield Cunas, MD;  Location: [facility];  Service: Neurosurgery;  Laterality: Left;  LEFT Lumbar Four-Five Redo microdiskectomy  . Back surgery    . Spinal cord stimulator trial N/A 04/23/2014    Procedure: LUMBAR SPINAL CORD STIMULATOR TRIAL;   Surgeon: Ashok Pall, MD;  Location: [place] [facility];  Service: Neurosurgery;  Laterality: N/A;  Spinal Cord Stimulator Trial  . Spinal cord stimulator insertion N/A 06/13/2014    Procedure: LUMBAR SPINAL CORD STIMULATOR INSERTION;  Surgeon: Ashok Pall, MD;  Location: [facility];  Service: Neurosurgery;  Laterality: N/A;  permanent spinal cord stimulator insertion  . Spinal cord stimulator removal N/A 12/17/2014    Procedure: THORACIC SPINAL CORD STIMULATOR REMOVAL;  Surgeon: Ashok Pall, MD;  Location: [place] [facility];  Service: Neurosurgery;  Laterality: N/A;  THORACIC SPINAL CORD STIMULATOR REMOVAL  . Colonoscopy with propofol N/A 09/25/2015    Procedure: COLONOSCOPY WITH PROPOFOL;  Surgeon: Rogene Houston, MD;  Location: [facility];  Service: Endoscopy;  Laterality: N/A;  2:30-moved to 7:30 Ann notified pt  . Polypectomy  09/25/2015    Procedure: POLYPECTOMY;  Surgeon: Rogene Houston, MD;  Location: [facility];  Service: Endoscopy;;  at cecum   Family History  Problem Relation Age of Onset  . Diabetes Mother   . Thyroid disease Mother   . Other Mother     PTSD  . Hyperlipidemia Mother   . Hypertension Maternal Aunt   . Diabetes Maternal Aunt   . Thyroid disease Maternal Aunt   . Diabetes Maternal Grandmother   . Heart disease Maternal Grandmother     CHF  . Hypertension Maternal Grandmother   . Dementia Maternal Grandmother   . Diabetes Father   . Hypertension Father   . Obesity Father   . Cancer Paternal Grandmother     breast  . Alcohol abuse Paternal Grandmother   . Crohn's disease Maternal Uncle   . Hyperlipidemia Maternal Uncle   . Other Maternal Uncle     back pain  . Dementia Maternal Uncle   . Diabetes Cousin   . Other Maternal Grandfather     lung transplant   Social History  Substance Use Topics  . Smoking status: Current Some Day Smoker -- 1.00 packs/day for 9 years    Types: Cigarettes  . Smokeless tobacco: Never Used     Comment: 1-2 cig per week  .  Alcohol Use: 0.0 oz/week    0 Standard drinks or equivalent per week     Comment: occasionally- once per month    OB History    Gravida Para Term Preterm AB TAB SAB Ectopic Multiple Living   0              Review of Systems A complete 10 system review of systems  was obtained and all systems are negative except as noted in the HPI and PMH.   Allergies  Bee venom; Dilaudid; Latex; Senna; and Adhesive  Home Medications   Prior to Admission medications   Medication Sig Start Date End Date Taking? Authorizing Provider  acetaminophen-codeine (TYLENOL #3) 300-30 MG tablet Take 1 tablet by mouth every 4 (four) hours as needed for moderate pain.   Yes Historical Provider, MD  albuterol (PROVENTIL HFA;VENTOLIN HFA) 108 (90 BASE) MCG/ACT inhaler Inhale 1-2 puffs into the lungs every 6 (six) hours as needed for wheezing. 04/23/15  Yes Roselyn Malachy Moan, MD  ALPRAZolam (XANAX XR) 3 MG 24 hr tablet Take 3 mg by mouth 2 (two) times daily.   Yes Historical Provider, MD  ALUVEA 39 % CREA Apply 1 application topically 2 (two) times daily.  03/03/14  Yes Historical Provider, MD  beclomethasone (QVAR) 80 MCG/ACT inhaler Inhale 2 puffs into the lungs as needed (shortness of breath).    Yes Historical Provider, MD  cloNIDine (CATAPRES) 0.1 MG tablet Take 0.1 mg by mouth 2 (two) times daily.    Yes Historical Provider, MD  Diclofenac Potassium (CAMBIA) 50 MG PACK Take 50 mg by mouth daily as needed (migraines).   Yes Historical Provider, MD  doxepin (SINEQUAN) 50 MG capsule Take 50 mg by mouth 3 (three) times daily.    Yes Historical Provider, MD  DULoxetine (CYMBALTA) 60 MG capsule Take 60 mg by mouth daily.  04/01/15  Yes Historical Provider, MD  esomeprazole (NEXIUM) 40 MG capsule TAKE 1 CAPSULE ONCE DAILY AT 12 NOON. 02/23/15  Yes Butch Penny, NP  gemfibrozil (LOPID) 600 MG tablet 600 mg 2 (two) times daily before a meal.  11/11/14  Yes Historical Provider, MD  HYDROcodone-acetaminophen (NORCO/VICODIN) 5-325  MG tablet Take 1 tablet by mouth every 6 (six) hours as needed for moderate pain.   Yes Historical Provider, MD  lubiprostone (AMITIZA) 24 MCG capsule Take 1 capsule (24 mcg total) by mouth daily with breakfast. 03/31/15  Yes Rogene Houston, MD  Magnesium Hydroxide (PHILLIPS MILK OF MAGNESIA PO) Take by mouth. Patient states that she took 2 tablespoons this morning.   Yes Historical Provider, MD  medroxyPROGESTERone (DEPO-PROVERA) 150 MG/ML injection INJECT 1 VIAL INTRAMUSCULARLY EVERY 3 MONTHS IN OFFICE. 08/04/15  Yes Estill Dooms, NP  megestrol (MEGACE) 40 MG tablet 3x5d, 2x5d, then 1 daily to help control vaginal bleeding. Stop taking when bleeding stops. Patient taking differently: Take 40 mg by mouth daily as needed (bleeding). 3x5d, 2x5d, then 1 daily to help control vaginal bleeding prn. Stop taking when bleeding stops. 03/03/15  Yes Roma Schanz, CNM  NASONEX 50 MCG/ACT nasal spray Place 1 spray into both nostrils as needed (allergies).  05/13/14  Yes Historical Provider, MD  nystatin (MYCOSTATIN/NYSTOP) 100000 UNIT/GM POWD Apply 1 g topically See admin instructions. Apply two-three times a day to affected area as needed 08/26/14  Yes Estill Dooms, NP  Olopatadine HCl (PATADAY) 0.2 % SOLN Place 1 drop into both eyes daily as needed (itchy eyes).   Yes Historical Provider, MD  pimecrolimus (ELIDEL) 1 % cream Apply 1 application topically 2 (two) times daily as needed (facial rash).   Yes Historical Provider, MD  promethazine (PHENERGAN) 25 MG tablet Take 1 tablet (25 mg total) by mouth every 6 (six) hours as needed. Patient taking differently: Take 25 mg by mouth every 6 (six) hours as needed for nausea.  08/07/14  Yes Fredia Sorrow, MD  propranolol (  INDERAL) 10 MG tablet Take 10 mg by mouth 3 (three) times daily.   Yes Historical Provider, MD  zolpidem (AMBIEN CR) 12.5 MG CR tablet 12.5 mg at bedtime as needed for sleep.  05/16/15  Yes Historical Provider, MD  hydrocortisone  (ANUSOL-HC) 25 MG suppository Place 1 suppository (25 mg total) rectally at bedtime. Patient not taking: Reported on 11/24/2015 09/25/15   Rogene Houston, MD   BP 108/91 mmHg  Pulse 113  Temp(Src) 98 F (36.7 C) (Oral)  Resp 22  Ht 5\' 7"  (1.702 m)  Wt 195 lb (88.451 kg)  BMI 30.53 kg/m2  SpO2 98%   Physical Exam  Constitutional: She is oriented to person, place, and time. She appears well-developed and well-nourished. No distress.  HENT:  Head: Normocephalic and atraumatic.  Eyes: EOM are normal.  Neck: Normal range of motion.  Cardiovascular: Normal rate, regular rhythm and normal heart sounds.   Pulmonary/Chest: Effort normal and breath sounds normal.  Abdominal: Soft. She exhibits no distension. There is tenderness.  She has bilateral lower abdominal tenderness.  Musculoskeletal: Normal range of motion.  Neurological: She is alert and oriented to person, place, and time.  Skin: Skin is warm and dry.  Psychiatric: She has a normal mood and affect. Judgment normal.  Nursing note and vitals reviewed.  ED Course  Procedures (including critical care time)  DIAGNOSTIC STUDIES: Oxygen Saturation is 98% on RA, normal by my interpretation.   COORDINATION OF CARE: 11:59 AM-Discussed next steps with pt including CMP, CBC, Lipase, and CT Abdomen pelvis w/ contrast. Pt verbalized understanding and is agreeable with the plan.   Labs Review Labs Reviewed  CBC - Abnormal; Notable for the following:    WBC 11.2 (*)    Platelets 413 (*)    All other components within normal limits  URINALYSIS, ROUTINE W REFLEX MICROSCOPIC (NOT AT [facility]) - Abnormal; Notable for the following:    Hgb urine dipstick SMALL (*)    All other components within normal limits  URINE MICROSCOPIC-ADD ON - Abnormal; Notable for the following:    Squamous Epithelial / LPF 0-5 (*)    Bacteria, UA RARE (*)    All other components within normal limits  LIPASE, BLOOD  COMPREHENSIVE METABOLIC PANEL  PREGNANCY,  URINE    Imaging Review Ct Abdomen Pelvis W Contrast  11/24/2015  CLINICAL DATA:  Abdominal cramping and sharp abdominal pain for 6 months. EXAM: CT ABDOMEN AND PELVIS WITH CONTRAST TECHNIQUE: Multidetector CT imaging of the abdomen and pelvis was performed using the standard protocol following bolus administration of intravenous contrast. CONTRAST:  12mL ISOVUE-300 IOPAMIDOL (ISOVUE-300) INJECTION 61% COMPARISON:  CT of the abdomen pelvis 08/07/2014 FINDINGS: Lower chest:  No acute findings. Hepatobiliary: No masses or other significant abnormality. Postcholecystectomy. Pancreas: No mass, inflammatory changes, or other significant abnormality. Spleen: Within normal limits in size and appearance. Adrenals/Urinary Tract: No masses identified. No evidence of hydronephrosis. Stomach/Bowel: No evidence of obstruction, or abnormal fluid collections. Gas fluid levels are seen throughout the ascending and transverse colon, suggestive of fluid colonic contents. The retrocecal appendix is not edematous, however it contains a small appendicolith at its tip. Vascular/Lymphatic: No pathologically enlarged lymph nodes. No evidence of abdominal aortic aneurysm. Reproductive: No mass or other significant abnormality. Other: None. Musculoskeletal:  No suspicious bone lesions identified. IMPRESSION: Gas fluid levels throughout the ascending and transverse colon suggestive of fluid like colonic contents without evidence of colonic wall thickening. Query mild colitis versus laxative use. Distal appendiceal appendicolith without  current evidence of acute appendicitis. The presence of an appendicolith predisposes the patient to bouts of tip appendicitis. No evidence of acute abnormality within the solid abdominal organs. Electronically Signed   By: Fidela Salisbury M.D.   On: 11/24/2015 13:15    I have personally reviewed and evaluated these images and lab results as part of my medical decision-making.  MDM   Final  diagnoses:  Abdominal pain, unspecified abdominal location    Patient will be referred back to gastroenterology for ongoing abdominal discomfort and constipation.  CT scan without acute pathology today.   I personally performed the services described in this documentation, which was scribed in my presence. The recorded information has been reviewed and is accurate.       Jola Schmidt, MD 11/24/15 [phone]

## 2015-11-24 NOTE — ED Notes (Signed)
Patient with no complaints at this time. Respirations even and unlabored. Skin warm/dry. Discharge instructions reviewed with patient at this time. Patient given opportunity to voice concerns/ask questions. IV removed per policy and band-aid applied to site. Patient discharged at this time and left Emergency Department with steady gait.  

## 2015-11-24 NOTE — Patient Instructions (Signed)
Patient advised to go to emergency room. ER called and staff informed.

## 2015-11-25 NOTE — Telephone Encounter (Signed)
This will be addressed with Dr.Rehman. 

## 2015-11-25 NOTE — Telephone Encounter (Signed)
Per Dr.Rehman - the patient will just need PRN appointments.

## 2015-12-04 ENCOUNTER — Emergency Department (HOSPITAL_COMMUNITY)
Admission: EM | Admit: 2015-12-04 | Discharge: 2015-12-05 | Disposition: A | Discharge status: Other Acute Inpatient Hospital | Payer: Medicaid Other | Attending: Emergency Medicine | Admitting: Emergency Medicine

## 2015-12-04 ENCOUNTER — Encounter (HOSPITAL_COMMUNITY): Payer: Self-pay | Admitting: *Deleted

## 2015-12-04 DIAGNOSIS — I10 Essential (primary) hypertension: Secondary | ICD-10-CM | POA: Insufficient documentation

## 2015-12-04 DIAGNOSIS — F329 Major depressive disorder, single episode, unspecified: Secondary | ICD-10-CM | POA: Insufficient documentation

## 2015-12-04 DIAGNOSIS — Z91040 Latex allergy status: Secondary | ICD-10-CM | POA: Insufficient documentation

## 2015-12-04 DIAGNOSIS — J45909 Unspecified asthma, uncomplicated: Secondary | ICD-10-CM | POA: Insufficient documentation

## 2015-12-04 DIAGNOSIS — Z79899 Other long term (current) drug therapy: Secondary | ICD-10-CM | POA: Insufficient documentation

## 2015-12-04 DIAGNOSIS — R4589 Other symptoms and signs involving emotional state: Secondary | ICD-10-CM

## 2015-12-04 DIAGNOSIS — F17210 Nicotine dependence, cigarettes, uncomplicated: Secondary | ICD-10-CM | POA: Insufficient documentation

## 2015-12-04 LAB — COMPREHENSIVE METABOLIC PANEL
ALBUMIN: 3.7 g/dL (ref 3.5–5.0)
ALT: 20 U/L (ref 14–54)
AST: 20 U/L (ref 15–41)
Alkaline Phosphatase: 57 U/L (ref 38–126)
Anion gap: 9 (ref 5–15)
BILIRUBIN TOTAL: 0.4 mg/dL (ref 0.3–1.2)
BUN: 5 mg/dL — AB (ref 6–20)
CHLORIDE: 105 mmol/L (ref 101–111)
CO2: 23 mmol/L (ref 22–32)
CREATININE: 0.88 mg/dL (ref 0.44–1.00)
Calcium: 9.5 mg/dL (ref 8.9–10.3)
GFR calc Af Amer: >60 mL/min (ref >60)
GLUCOSE: 83 mg/dL (ref 65–99)
Potassium: 3.7 mmol/L (ref 3.5–5.1)
Sodium: 137 mmol/L (ref 135–145)
Total Protein: 6.9 g/dL (ref 6.5–8.1)

## 2015-12-04 LAB — CBC
HEMATOCRIT: 38 % (ref 36.0–46.0)
Hemoglobin: 13.4 g/dL (ref 12.0–15.0)
MCH: 31.2 pg (ref 26.0–34.0)
MCHC: 35.3 g/dL (ref 30.0–36.0)
MCV: 88.6 fL (ref 78.0–100.0)
PLATELETS: 441 10*3/uL — AB (ref 150–400)
RBC: 4.29 MIL/uL (ref 3.87–5.11)
RDW: 13.7 % (ref 11.5–15.5)
WBC: 10.2 10*3/uL (ref 4.0–10.5)

## 2015-12-04 LAB — SALICYLATE LEVEL: Salicylate Lvl: <4 mg/dL (ref 2.8–30.0)

## 2015-12-04 LAB — RAPID URINE DRUG SCREEN, HOSP PERFORMED
AMPHETAMINES: NOT DETECTED
BARBITURATES: NOT DETECTED
BENZODIAZEPINES: POSITIVE — AB
Cocaine: NOT DETECTED
Opiates: POSITIVE — AB
TETRAHYDROCANNABINOL: POSITIVE — AB

## 2015-12-04 LAB — ACETAMINOPHEN LEVEL: Acetaminophen (Tylenol), Serum: <10 ug/mL — ABNORMAL LOW (ref 10–30)

## 2015-12-04 LAB — I-STAT BETA HCG BLOOD, ED (MC, WL, AP ONLY)

## 2015-12-04 LAB — ETHANOL

## 2015-12-04 MED ORDER — ZOLPIDEM TARTRATE 5 MG PO TABS: 5.0000 mg | ORAL_TABLET | Freq: Every evening | ORAL | Status: DC | PRN

## 2015-12-04 MED ORDER — DULOXETINE HCL 60 MG PO CPEP
60.0000 mg | ORAL_CAPSULE | Freq: Two times a day (BID) | ORAL | Status: DC
  Administered 2015-12-04: 60 mg via ORAL
  Filled 2015-12-04: qty 1

## 2015-12-04 MED ORDER — DOXEPIN HCL 25 MG PO CAPS
50.0000 mg | ORAL_CAPSULE | Freq: Three times a day (TID) | ORAL | Status: DC
  Administered 2015-12-04: 50 mg via ORAL
  Filled 2015-12-04: qty 2

## 2015-12-04 MED ORDER — CLONIDINE HCL 0.1 MG PO TABS
0.1000 mg | ORAL_TABLET | Freq: Two times a day (BID) | ORAL | Status: DC
  Administered 2015-12-04: 0.1 mg via ORAL
  Filled 2015-12-04: qty 1

## 2015-12-04 MED ORDER — PANTOPRAZOLE SODIUM 40 MG PO TBEC
40.0000 mg | DELAYED_RELEASE_TABLET | Freq: Every day | ORAL | Status: DC
  Administered 2015-12-04: 40 mg via ORAL
  Filled 2015-12-04: qty 1

## 2015-12-04 MED ORDER — PROPRANOLOL HCL 10 MG PO TABS
10.0000 mg | ORAL_TABLET | Freq: Three times a day (TID) | ORAL | Status: DC
  Administered 2015-12-04: 10 mg via ORAL
  Filled 2015-12-04: qty 1

## 2015-12-04 MED ORDER — ACETAMINOPHEN 325 MG PO TABS: 650.0000 mg | ORAL_TABLET | Freq: Once | ORAL | Status: DC
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
## 2015-12-04 NOTE — ED Notes (Signed)
Sitter in room. Personal artifacts are being taken off pt now and placed in a bag.

## 2015-12-04 NOTE — ED Notes (Signed)
TTS in process at this time  
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
## 2015-12-04 NOTE — ED Notes (Signed)
Telepsych call at this time.

## 2015-12-04 NOTE — ED Notes (Signed)
PT given something to drink. PT has no further requests at this time. PT sitting up in bed watching TV. Sitter and family member at bedside.
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
## 2015-12-04 NOTE — BH Assessment (Signed)
Assessment completed. Consulted Dr. Shea Evans who agrees that pt meets inpatient criteria. TTS to seek placement. Informed Dr. Lita Mains of the recommendation.
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
## 2015-12-04 NOTE — BH Assessment (Signed)
Pt has been accepted [facility] to Room 405-2 (Dr.Cobos). Informed Dr. Glenetta Borg of the disposition.

## 2015-12-05 ENCOUNTER — Inpatient Hospital Stay (HOSPITAL_COMMUNITY)
Admission: AD | Admit: 2015-12-05 | Discharge: 2015-12-07 | DRG: 885 | Disposition: A | Discharge status: Home / Self Care | Payer: No Typology Code available for payment source | Source: Intra-hospital | Attending: Psychiatry | Admitting: Psychiatry

## 2015-12-05 ENCOUNTER — Encounter (HOSPITAL_COMMUNITY): Payer: Self-pay

## 2015-12-05 DIAGNOSIS — F909 Attention-deficit hyperactivity disorder, unspecified type: Secondary | ICD-10-CM | POA: Diagnosis present

## 2015-12-05 DIAGNOSIS — F410 Panic disorder [episodic paroxysmal anxiety] without agoraphobia: Secondary | ICD-10-CM | POA: Diagnosis present

## 2015-12-05 DIAGNOSIS — F329 Major depressive disorder, single episode, unspecified: Secondary | ICD-10-CM | POA: Diagnosis present

## 2015-12-05 DIAGNOSIS — F3181 Bipolar II disorder: Secondary | ICD-10-CM | POA: Diagnosis not present

## 2015-12-05 DIAGNOSIS — F17210 Nicotine dependence, cigarettes, uncomplicated: Secondary | ICD-10-CM | POA: Diagnosis present

## 2015-12-05 DIAGNOSIS — F319 Bipolar disorder, unspecified: Secondary | ICD-10-CM | POA: Diagnosis present

## 2015-12-05 DIAGNOSIS — R45850 Homicidal ideations: Secondary | ICD-10-CM | POA: Diagnosis present

## 2015-12-05 DIAGNOSIS — F32A Depression, unspecified: Secondary | ICD-10-CM | POA: Diagnosis present

## 2015-12-05 MED ORDER — ENSURE ENLIVE PO LIQD
237.0000 mL | Freq: Two times a day (BID) | ORAL | Status: DC
  Administered 2015-12-05 – 2015-12-07 (×5): 237 mL via ORAL

## 2015-12-05 MED ORDER — PNEUMOCOCCAL VAC POLYVALENT 25 MCG/0.5ML IJ INJ
0.5000 mL | INJECTION | INTRAMUSCULAR | Status: AC
  Administered 2015-12-07: 0.5 mL via INTRAMUSCULAR

## 2015-12-05 MED ORDER — ALUM & MAG HYDROXIDE-SIMETH 200-200-20 MG/5ML PO SUSP
30.0000 mL | ORAL | Status: DC | PRN
Start: 2015-12-05 — End: 2015-12-07

## 2015-12-05 MED ORDER — MAGNESIUM HYDROXIDE 400 MG/5ML PO SUSP
30.0000 mL | Freq: Every day | ORAL | Status: DC | PRN
  Administered 2015-12-05: 30 mL via ORAL
  Filled 2015-12-05: qty 30

## 2015-12-05 MED ORDER — ACETAMINOPHEN 325 MG PO TABS: 650.0000 mg | ORAL_TABLET | Freq: Four times a day (QID) | ORAL | Status: DC | PRN

## 2015-12-05 MED ORDER — NICOTINE 21 MG/24HR TD PT24
21.0000 mg | MEDICATED_PATCH | Freq: Every day | TRANSDERMAL | Status: DC
  Administered 2015-12-06: 21 mg via TRANSDERMAL
  Filled 2015-12-05 (×5): qty 1

## 2015-12-05 MED ORDER — CLOTRIMAZOLE-BETAMETHASONE 1-0.05 % EX CREA
TOPICAL_CREAM | Freq: Two times a day (BID) | CUTANEOUS | Status: DC
  Administered 2015-12-05: 21:00:00 via TOPICAL
  Filled 2015-12-05: qty 15

## 2015-12-05 MED ORDER — LAMOTRIGINE 25 MG PO TABS
25.0000 mg | ORAL_TABLET | Freq: Every day | ORAL | Status: DC
  Administered 2015-12-05 – 2015-12-07 (×3): 25 mg via ORAL
  Filled 2015-12-05 (×3): qty 1
  Filled 2015-12-05: qty 7
  Filled 2015-12-05: qty 1

## 2015-12-05 MED ORDER — PANTOPRAZOLE SODIUM 40 MG PO TBEC
40.0000 mg | DELAYED_RELEASE_TABLET | Freq: Every day | ORAL | Status: DC
  Administered 2015-12-05 – 2015-12-07 (×3): 40 mg via ORAL
  Filled 2015-12-05 (×2): qty 1
  Filled 2015-12-05: qty 7
  Filled 2015-12-05 (×3): qty 1

## 2015-12-05 MED ORDER — DOXEPIN HCL 25 MG PO CAPS
50.0000 mg | ORAL_CAPSULE | Freq: Every day | ORAL | Status: DC
  Administered 2015-12-05 – 2015-12-06 (×2): 50 mg via ORAL
  Filled 2015-12-05: qty 14
  Filled 2015-12-05: qty 2
  Filled 2015-12-05: qty 1
  Filled 2015-12-05: qty 2

## 2015-12-05 MED ORDER — ALPRAZOLAM 1 MG PO TABS
3.0000 mg | ORAL_TABLET | Freq: Two times a day (BID) | ORAL | Status: DC | PRN
  Administered 2015-12-05 – 2015-12-07 (×5): 3 mg via ORAL
  Filled 2015-12-05 (×5): qty 3
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
## 2015-12-05 NOTE — [facility] Group Notes (Signed)
[facility] LCSW Group Therapy  12/05/2015 10:00 AM  Type of Therapy:  Group Therapy  Participation Level:  Active  Participation Quality:  Appropriate and Attentive  Affect:  Appropriate  Cognitive:  Alert and Oriented  Insight:  Improving  Engagement in Therapy:  Engaged  Modes of Intervention:  Discussion  Summary of Progress/Problems: Group began discussion about accountability for treatment and behavior. Began by assessing truth to self. Discussed how this is complicated by history of shaming and fear of judgement. Also, discussed good and bad coping skills and the importance of using identifying appropriate ways to practice good coping skills as they adjust to use. Then group discussed the importance of accountability in mood and anger management as well as developing trust.  Patient shared openly about challenges with anger management and with lacking trust in others. Patient open to learning idea about allowing others to earn trust and supportive of using accountability to address anger issues.   Sharon Vazquez 12/05/2015, 4:30 PM
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
## 2015-12-05 NOTE — Tx Team (Signed)
Initial Interdisciplinary Treatment Plan   PATIENT STRESSORS: Financial difficulties Health problems Marital or family conflict Occupational concerns   PATIENT STRENGTHS: Ability for insight Active sense of humor Average or above average intelligence General fund of knowledge Motivation for treatment/growth   PROBLEM LIST: Problem List/Patient Goals Date to be addressed Date deferred Reason deferred Estimated date of resolution  depression 12/05/2015     anxiety 99991111     Family conflict [id number]     homicidal ideation 12/05/2015     Risk for suicide 12/05/2015     "work on anger issues" 12/05/2015     "readjust medication" 12/05/2015                  DISCHARGE CRITERIA:  Ability to meet basic life and health needs Improved stabilization in mood, thinking, and/or behavior Motivation to continue treatment in a less acute level of care Need for constant or close observation no longer present Reduction of life-threatening or endangering symptoms to within safe limits  PRELIMINARY DISCHARGE PLAN: Attend aftercare/continuing care group Attend PHP/IOP Participate in family therapy Return to previous living arrangement  PATIENT/FAMIILY INVOLVEMENT: This treatment plan has been presented to and reviewed with the patient, Sharon Vazquez,  The patient and family have been given the opportunity to ask questions and make suggestions.  JEHU-APPIAH, Kynsley Whitehouse K 12/05/2015, 2:47 AM

## 2015-12-05 NOTE — Progress Notes (Signed)
[name] is seen [facility] on the [address] hall  today. SHe is very pleasant and cooperative . She is attentive in her group and she completes her daily assessement and on it she wrote she denied SI and she rated her depression, hopelessness and anxeity " 0", respectively. R Safety in place.
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
## 2015-12-06 MED ORDER — DIPHENHYDRAMINE HCL 50 MG/ML IJ SOLN
25.0000 mg | Freq: Once | INTRAMUSCULAR | Status: AC
  Administered 2015-12-06: 25 mg via INTRAMUSCULAR
  Filled 2015-12-06: qty 0.5

## 2015-12-06 MED ORDER — GEMFIBROZIL 600 MG PO TABS
300.0000 mg | ORAL_TABLET | Freq: Two times a day (BID) | ORAL | Status: DC
  Administered 2015-12-06 – 2015-12-07 (×2): 300 mg via ORAL
  Filled 2015-12-06: qty 7
  Filled 2015-12-06 (×4): qty 0.5
  Filled 2015-12-06: qty 7

## 2015-12-06 MED ORDER — DIPHENHYDRAMINE HCL 50 MG/ML IJ SOLN
INTRAMUSCULAR | Status: AC
  Administered 2015-12-06: 25 mg
  Filled 2015-12-06: qty 1

## 2015-12-06 MED ORDER — ALBUTEROL SULFATE HFA 108 (90 BASE) MCG/ACT IN AERS
2.0000 | INHALATION_SPRAY | RESPIRATORY_TRACT | Status: DC | PRN
Start: 2015-12-06 — End: 2015-12-07

## 2015-12-06 MED ORDER — LUBIPROSTONE 24 MCG PO CAPS
24.0000 ug | ORAL_CAPSULE | Freq: Two times a day (BID) | ORAL | Status: DC
  Administered 2015-12-06 – 2015-12-07 (×2): 24 ug via ORAL
  Filled 2015-12-06 (×6): qty 1

## 2015-12-06 MED ORDER — ALBUTEROL SULFATE HFA 108 (90 BASE) MCG/ACT IN AERS
INHALATION_SPRAY | RESPIRATORY_TRACT | Status: AC
  Administered 2015-12-06: 17:00:00
  Filled 2015-12-06: qty 6.7

## 2015-12-06 NOTE — Progress Notes (Signed)
Adult Psychoeducational Group Note  Date:  12/06/2015 Time:  1:05 AM  Group Topic/Focus:  Wrap-Up Group:   The focus of this group is to help patients review their daily goal of treatment and discuss progress on daily workbooks.  Participation Level:  Minimal  Participation Quality:  Attentive  Affect:  Depressed  Cognitive:  Alert and Oriented  Insight: Lacking  Engagement in Group:  Lacking  Modes of Intervention:  Discussion and Support  Additional Comments:  Patient attended group, but did not wish to share.   Krisna Omar W Gisela Lea [id number], 1:05 AM
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
## 2015-12-06 NOTE — BHH Group Notes (Signed)
[facility] LCSW Group Therapy Note    12/06/2015  10:00 AM   Participation - Present and engaged  Group was able to discuss Adjustment, Change, Power and Healing. Group was able to identify their successes and challenges with adjustments. Then group discussed progress toward change. In understanding change, clarified that deep uncontrollable changes that can't be measured in power, or lack of, but must be measured in progress toward healing. Patient worked through Engineer, agricultural for change in order to support progress toward goals.   1. Identified systems of coping when dealing with challenges and maintaining balance in difficult situations.  2. Identified important criteria for trusted supporters.  3. Acknowledged the importance of limits and structure. 4. Engaged in discussion about setting up goals with detailed plans to accomplish them.  5. Identified and detailed coping skills to deal with emotions in different environments.   Progress - Patient identified that she wanted to work on anger management and was open to plans to use journalling and treatment support to manage mood and anger.   Christene Lye MSW, LCSW

## 2015-12-06 NOTE — Progress Notes (Signed)
Psychoeducational Group Note  Date: 12/06/2015 Time:  0900  Group Topic/Focus:  Goal Setting :   The group focuses on teaching patients how to set attainable goas that will help them in their daily recovery. l  Participation Level:  Active  Participation Quality:  Appropriate  Affect:  Anxious  Cognitive:  Appropriate  Insight:  Engaged  Engagement in Group:  Distracting  Additional Comments:    12/06/2015,10:46 AM Lauralyn Primes

## 2015-12-06 NOTE — Progress Notes (Addendum)
D Adream cont to focus on otehrs' behaviors and problems...she does not like to talk about her actual feelings or her behaviors that contribute to her [problems. A She completes a daily assessment and on it she wrote she deneid  SI today and she rated her depression, hopelessness and anxiety " 0/0/0/", respectively.R Safety in place. Addendum 1800: pt returned formoutside recreation, breathing fast, clawinga t the tops of her feet...saying " I'm having a reaction I'm having a reaction". Pt observe to have rr 23/min, cough dry and forced. Pt panting and purposely rolling her eyes back in her head, she says she ahs not touched any grass anddenies being bitten by anything,. " give me benadryl.....give me something". Pt would not allow staff to hel her calm down, adamant that she is having anaphylaxis. RN obtained order to give her IM benadryl and inhaler and this was done. Pt stable.
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
## 2015-12-07 LAB — GLUCOSE, CAPILLARY: Glucose-Capillary: 93 mg/dL (ref 65–99)

## 2015-12-07 MED ORDER — NICOTINE 21 MG/24HR TD PT24: 21.0000 mg | MEDICATED_PATCH | Freq: Every day | TRANSDERMAL | Status: DC

## 2015-12-07 MED ORDER — PANTOPRAZOLE SODIUM 40 MG PO TBEC: 40.0000 mg | DELAYED_RELEASE_TABLET | Freq: Every day | ORAL | Status: DC

## 2015-12-07 MED ORDER — CLOTRIMAZOLE-BETAMETHASONE 1-0.05 % EX CREA: TOPICAL_CREAM | Freq: Two times a day (BID) | CUTANEOUS | Status: DC

## 2015-12-07 MED ORDER — LUBIPROSTONE 24 MCG PO CAPS
24.0000 ug | ORAL_CAPSULE | Freq: Every day | ORAL | Status: DC
Start: 2015-12-08 — End: 2015-12-07
  Filled 2015-12-07: qty 1

## 2015-12-07 MED ORDER — GEMFIBROZIL 600 MG PO TABS: 300.0000 mg | ORAL_TABLET | Freq: Two times a day (BID) | ORAL | Status: DC

## 2015-12-07 MED ORDER — ENSURE ENLIVE PO LIQD: 237.0000 mL | Freq: Every day | ORAL | Status: DC | PRN

## 2015-12-07 MED ORDER — DOXEPIN HCL 50 MG PO CAPS: 50.0000 mg | ORAL_CAPSULE | Freq: Every day | ORAL | Status: DC

## 2015-12-07 MED ORDER — LAMOTRIGINE 25 MG PO TABS: 25.0000 mg | ORAL_TABLET | Freq: Every day | ORAL | Status: DC

## 2015-12-07 NOTE — [facility] Suicide Risk Assessment (Signed)
[facility] INPATIENT:  Family/Significant Other Suicide Prevention Education  Suicide Prevention Education:   Pt has not been suicidal throughout admission and denied SI on admission. Pt declines other family contact. Further, Pt adamantly denies HI towards uncle and denies plan or intent.   Peri Maris, [place] Work [phone]

## 2015-12-07 NOTE — Progress Notes (Signed)
Patient to discharged ambulatory to friend in  lobby. All discharge paperwork given and signed, valuables returned. Prescriptions and samples given. Patient able to verbalize understanding. Patient stable, denies SI/HI/AVH. Patient given opportunity to express concerns and ask questions.

## 2015-12-07 NOTE — Progress Notes (Signed)
D: Patient's self inventory sheet: patient has good sleep, recieved sleep medication.good  Appetite, normal energy level, good concentration. Rated depression 0/10, hopeless 0/10, anxiety 0/10. SI/HI/AVH: denies all. Physical complaints are chronic neck and back pain 5/10. Goal is "medication management". Plans to work on "talk to my doctor about it".   A: Medications administered, assessed medication knowledge and education given on medication regimen.  Emotional support and encouragement given patient. R: Denies SI and HI , contracts for safety.States that she is ready to discharge home.  Safety maintained with 15 minute checks.

## 2015-12-07 NOTE — Progress Notes (Signed)
Adult Psychoeducational Group Note  Date:  12/07/2015 Time:  12:31 AM  Group Topic/Focus:  Wrap-Up Group:   The focus of this group is to help patients review their daily goal of treatment and discuss progress on daily workbooks.  Participation Level:  Active  Participation Quality:  Appropriate and Sharing  Affect:  Appropriate  Cognitive:  Alert, Appropriate and Oriented  Insight: Appropriate  Engagement in Group:  Engaged  Modes of Intervention:  Discussion and Support  Additional Comments:  Patient articulated that her day was a 69.  For her healthy support system she plans to use her mom, step dad and friends.   Lynette Topete W Hawraa Stambaugh [phone], 12:31 AM

## 2015-12-07 NOTE — Tx Team (Addendum)
Interdisciplinary Treatment Plan Update (Adult) Date: 12/07/2015   Date: 12/07/2015 8:50 AM  Progress in Treatment:  Attending groups: Yes  Participating in groups: Yes  Taking medication as prescribed: Yes  Tolerating medication: Yes  Family/Significant othe contact made: No, Pt declines Patient understands diagnosis: Continuing to assess Discussing patient identified problems/goals with staff: Yes  Medical problems stabilized or resolved: Yes  Denies suicidal/homicidal ideation: Yes Patient has not harmed self or Others: Yes   New problem(s) identified: None identified at this time.   Discharge Plan or Barriers: Pt will return home and follow-up with outpatient services.   Additional comments:  Patient and CSW reviewed pt's identified goals and treatment plan. Patient verbalized understanding and agreed to treatment plan.   Reason for Continuation of Hospitalization:  Anxiety Medication stabilization   Estimated length of stay: 0 days  Review of initial/current patient goals per problem list:   1.  Goal(s): Patient will participate in aftercare plan  Met:  Yes  Target date: 3-5 days from date of admission   As evidenced by: Patient will participate within aftercare plan AEB aftercare provider and housing plan at discharge being identified.   12/07/15: Pt will return home and follow-up with outpatient services.   2.  Goal (s): Patient will exhibit decreased depressive symptoms and suicidal ideations.  Met:  Adequate for DC  Target date: 3-5 days from date of admission   As evidenced by: Patient will utilize self rating of depression at 3 or below and demonstrate decreased signs of depression or be deemed stable for discharge by MD.  12/07/15: MD feels that Pt's symptoms have decreased to the point that they can be managed in an outpatient setting.   Attendees:  Patient:    Family:    Physician: Dr. Parke Poisson, MD  12/07/2015 8:50 AM  Nursing: Loletta Specter, RN; Arlyce Harman.,  RN  12/07/2015 8:50 AM  Clinical Social Worker Peri Maris, [place] 12/07/2015 8:50 AM  Other: Tilden Fossa, [place] 12/07/2015 8:50 AM  Clinical:  12/07/2015 8:50 AM  Other:  12/07/2015 8:50 AM  Other:     Peri Maris, [place] Social Work [phone]
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
## 2015-12-07 NOTE — Progress Notes (Signed)
Recreation Therapy Notes  Date: 07.03.2017 Time: 9:30am Location: 300 [address] Group Room   Group Topic: Stress Management  Goal Area(s) Addresses:  Patient will actively participate in stress management techniques presented during session.   Behavioral Response: Did not attend.   Laureen Ochs Averleigh Savary, LRT/CTRS        Embry Huss L 12/07/2015 1:55 PM

## 2015-12-07 NOTE — Progress Notes (Signed)
  [facility] Adult Case Management Discharge Plan :  Will you be returning to the same living situation after discharge:  Yes,  Pt returning home At discharge, do you have transportation home?: Yes,  friend to pick up Do you have the ability to pay for your medications: Yes,  Pt provided with prescriptions and samples  Release of information consent forms completed and in the chart;  Patient's signature needed at discharge.  Patient to Follow up at: Follow-up Information    Follow up with [facility] On 12/22/2015.   Why:  at 11:40am for medication management with Dr. Dolores Frame information:   [address] [place], [place] [address] [phone]      Follow up with [facility].   Why:  Please walk-in between 8am-5pm Monday-Friday for your initial assessment. At this assessment, they will help you schedule an appointment at the [facility] office   Contact information:   [address] [place] [address] [phone]       Next level of care provider has access to [place] and Suicide Prevention discussed: No, Pt was not suicidal on admission or throughout hospitalization.  Have you used any form of tobacco in the last 30 days? (Cigarettes, Smokeless Tobacco, Cigars, and/or Pipes): Yes  Has patient been referred to the Quitline?: Patient refused referral  Patient has been referred for addiction treatment: Yes  Bo Mcclintock 12/07/2015, 10:29 AM

## 2015-12-07 NOTE — [facility] Suicide Risk Assessment (Signed)
[facility] Discharge Suicide Risk Assessment   Principal Problem: Bipolar 2 disorder, major depressive episode [facility]) Discharge Diagnoses:  Patient Active Problem List   Diagnosis Date Noted  . Bipolar 2 disorder, major depressive episode ([place]) [F31.81] 12/05/2015  . Intractable pain [R52] 12/17/2014  . Mass of breast, right [N63] 08/26/2014  . Chronic pain [G89.29] 06/13/2014  . Lumbar radiculopathy [M54.16] 04/23/2014  . Irregular periods [N92.6] 04/02/2014  . Displacement of lumbar intervertebral disc without myelopathy [M51.26] 11/01/2013  . Contraceptive management [Z30.9] 08/23/2013  . Superficial fungus infection of skin [B36.9] 08/23/2013  . HNP (herniated nucleus pulposus), lumbar [M51.26] 07/03/2013  . Right ovarian cyst [N83.201] 01/22/2013  . Chronic constipation [K59.00] 12/17/2012  . 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 Unspecified essential hypertension [I10] 08/07/2012  . Unspecified constipation [K59.00] 08/07/2012  . Bipolar disorder, unspecified ([place]) [F31.9] 08/07/2012  . Attention deficit hyperactivity disorder [F90.9] 08/07/2012  . Panic disorder [F41.0] 08/07/2012  . Rectal bleeding [K62.5] 08/07/2012  . Abdominal pain, right upper quadrant [R10.11] 08/07/2012    Total Time spent with patient: 30 minutes  Musculoskeletal: Strength & Muscle Tone: within normal limits Gait & Station: normal Patient leans: N/A  Psychiatric Specialty Exam: ROS denies headache, denies chest pain, no shortness of breath , reports chronic constipation   Blood pressure 135/89, pulse 130, temperature 98.7 F (37.1 C), temperature source Oral, resp. rate 18, height 5' 4.57" (1.64 m), weight 193 lb (87.544 kg), SpO2 98 %.Body mass index is 32.55 kg/(m^2).  General Appearance: Well Groomed  Eye Contact::  Good  Speech:  Normal Rate409  Volume:  Normal  Mood:  improving mood, states she is feeling " better"  Affect:  Appropriate and vaguely anxious   Thought  Process:  Linear  Orientation:  Full (Time, Place, and Person)  Thought Content:  denies hallucinations, no delusions, not internally preoccupied   Suicidal Thoughts:  No- denies any suicidal ideations, denies any self injurious ideations   Homicidal Thoughts:  No- denies any homicidal ideations   Memory:  recent and remote grossly intact   Judgement:  Other:  improved   Insight:  improving   Psychomotor Activity:  Normal  Concentration:  Good  Recall:  Good  Fund of Knowledge:Good  Language: Good  Akathisia:  Negative  Handed:  Right  AIMS (if indicated):     Assets:  Communication Skills Resilience  Sleep:  Number of Hours: 5.25  Cognition: WNL  ADL's:  Intact   Mental Status Per Nursing Assessment::   On Admission:     Demographic Factors:  35 year old separated female, no children, currently unemployed, lives with roommate   Loss Factors: Chronic pain. Unemployment , financial difficulties   Historical Factors: History of Bipolar Disorder, one prior psychiatric admissions, one prior suicide attempt   Risk Reduction Factors:   Sense of responsibility to family, Living with another person, especially a relative and Positive coping skills or problem solving skills  Continued Clinical Symptoms:  At this time patient alert, attentive,well related, calm, no thought disorder, mood reported as improved, affect more reactive, no thought disorder, no SI, no HI, no psychotic symptoms. Denies medication side effects. Of note, we have reviewed side effects to include sedation, habituation risk,  Withdrawal risk,constipation ( patient has been on doxepin for " a long time ", and states it is helpful.) - we also discussed risk of lamictal related rash   Cognitive Features That Contribute To Risk:  No gross cognitive deficits noted upon  discharge. Is alert , attentive, and oriented x 3  Suicide Risk:  Mild:  Suicidal ideation of limited frequency, intensity, duration, and  specificity.  There are no identifiable plans, no associated intent, mild dysphoria and related symptoms, good self-control (both objective and subjective assessment), few other risk factors, and identifiable protective factors, including available and accessible social support.  Follow-up Information    Follow up with [facility] On 12/22/2015.   Why:  at 11:40am for medication management with Dr. Dolores Frame information:   [address] [place], [place] [address] [phone]      Follow up with [facility].   Why:  Please walk-in between 8am-5pm Monday-Friday for your initial assessment. At this assessment, they will help you schedule an appointment at the [facility] office   Contact information:   [address] [place]  [address] [phone]       Call [facility].   Why:  Please call to touch base with your disability advocate   Contact information:   [place] [place] [address] [phone]      Plan Of Care/Follow-up recommendations:  Activity:  as tolerated  Diet:  Regular  Tests:  NA Other:  See below  Patient is requesting discharge and there are no current grounds for involuntary commitment  Plans to return home  Plans to follow up as above  Noralee Dutko, Felicita Gage, MD 12/07/2015, 11:03 AM

## 2016-01-23 ENCOUNTER — Emergency Department (HOSPITAL_COMMUNITY)
Admission: EM | Admit: 2016-01-23 | Discharge: 2016-01-23 | Disposition: A | Discharge status: Home / Self Care | Payer: Medicaid Other | Attending: Emergency Medicine | Admitting: Emergency Medicine

## 2016-01-23 ENCOUNTER — Encounter (HOSPITAL_COMMUNITY): Payer: Self-pay | Admitting: *Deleted

## 2016-01-23 DIAGNOSIS — Z91040 Latex allergy status: Secondary | ICD-10-CM | POA: Insufficient documentation

## 2016-01-23 DIAGNOSIS — M5432 Sciatica, left side: Secondary | ICD-10-CM | POA: Insufficient documentation

## 2016-01-23 DIAGNOSIS — F17210 Nicotine dependence, cigarettes, uncomplicated: Secondary | ICD-10-CM | POA: Insufficient documentation

## 2016-01-23 DIAGNOSIS — Z79899 Other long term (current) drug therapy: Secondary | ICD-10-CM | POA: Insufficient documentation

## 2016-01-23 DIAGNOSIS — I10 Essential (primary) hypertension: Secondary | ICD-10-CM | POA: Insufficient documentation

## 2016-01-23 DIAGNOSIS — J45909 Unspecified asthma, uncomplicated: Secondary | ICD-10-CM | POA: Insufficient documentation

## 2016-01-23 DIAGNOSIS — F909 Attention-deficit hyperactivity disorder, unspecified type: Secondary | ICD-10-CM | POA: Insufficient documentation

## 2016-01-23 MED ORDER — CYCLOBENZAPRINE HCL 10 MG PO TABS: 10.0000 mg | ORAL_TABLET | Freq: Three times a day (TID) | ORAL | 0 refills | Status: DC

## 2016-01-23 MED ORDER — TRAMADOL HCL 50 MG PO TABS: 50.0000 mg | ORAL_TABLET | Freq: Four times a day (QID) | ORAL | 0 refills | Status: DC | PRN

## 2016-01-23 MED ORDER — DEXAMETHASONE 4 MG PO TABS: 4.0000 mg | ORAL_TABLET | Freq: Two times a day (BID) | ORAL | 0 refills | Status: DC

## 2016-01-23 MED ORDER — DEXAMETHASONE SODIUM PHOSPHATE 4 MG/ML IJ SOLN
8.0000 mg | Freq: Once | INTRAMUSCULAR | Status: AC
  Administered 2016-01-23: 8 mg via INTRAMUSCULAR
  Filled 2016-01-23: qty 2

## 2016-01-23 MED ORDER — DIAZEPAM 5 MG PO TABS
10.0000 mg | ORAL_TABLET | Freq: Once | ORAL | Status: AC
  Administered 2016-01-23: 10 mg via ORAL
  Filled 2016-01-23: qty 2

## 2016-01-23 MED ORDER — DICLOFENAC SODIUM 75 MG PO TBEC: 75.0000 mg | DELAYED_RELEASE_TABLET | Freq: Two times a day (BID) | ORAL | 0 refills | Status: DC

## 2016-01-23 MED ORDER — MORPHINE SULFATE (PF) 4 MG/ML IV SOLN
8.0000 mg | Freq: Once | INTRAVENOUS | Status: AC
  Administered 2016-01-23: 8 mg via INTRAMUSCULAR
  Filled 2016-01-23: qty 2

## 2016-01-23 MED ORDER — ONDANSETRON HCL 4 MG PO TABS
4.0000 mg | ORAL_TABLET | Freq: Once | ORAL | Status: AC
Start: 2016-01-23 — End: 2016-01-23
  Administered 2016-01-23: 4 mg via ORAL
  Filled 2016-01-23: qty 1

## 2016-01-23 NOTE — Discharge Instructions (Signed)
Heating pad to your hip maybe helpful. Please use your cane when up and about until you're hip and knees strengthened. Please use Flexeril 3 times daily, Decadron 2 times daily, and diclofenac 2 times daily with food. Use Ultram every 6 hours as needed for more severe pain. Please do not take your Ultram within 3 hours of taking your sinequan. Flexeril and Ultram may cause drowsiness, please do not drink alcohol, drive a vehicle, operate machinery, or participate in activities requiring concentration when taking this medication.

## 2016-01-23 NOTE — ED Provider Notes (Signed)
[facility] [facility] DEPT Provider Note   CSN: [id number] Arrival date & time: 01/23/16  1255     History   Chief Complaint Chief Complaint  Patient presents with  . Hip Pain    HPI Sharon Vazquez is a 35 y.o. female.  Patient is a 35 year old female who presents to the emergency department with a complaint of left hip pain.  The patient states that she has had multiple surgeries on her back. 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 She states that she's had some pain from time to time since her last surgery, but starting on Wednesday she is beginning to have flares of what she thinks to be her sciatica. She complains of pain that moves from the left buttocks down into her foot. She states that at times her foot feels as though there are pins and needles present. She has not had any loss of bowel or bladder function, but she did have a fall when she felt as though her knee gave away earlier today. Sometimes when she can put her leg under her right leg and lain in a particular position she can take some of the pressure off of the left hip, otherwise there is nothing that really helps the pain. Straightening the leg out and also walking on causes more pain.      Past Medical History:  Diagnosis Date  . Abnormal Pap smear   . ADHD (attention deficit hyperactivity disorder)   . Anxiety   . Anxiety    takes Xanax daily as needed  . Arthritis    degenerative spine  . Asthma    Ventolin as needed and QVAR takes daily  . Back spasm    takes Flexeril daily as needed  . Bipolar 1 disorder ([place])   . Bipolar 1 disorder ([facility])    takes DOxepin daily  . Borderline personality disorder   . Chronic back pain    HNP  . Constipation    takes Dulcolax daily as needed takes Amitiza daily  . Contraceptive management 08/23/2013  . Eczema    has 2 creams uses as needed  .  GERD (gastroesophageal reflux disease)    takes Tums as needed  . Headache(784.0)   . Headache(784.0)    migraines-last one about 59months ago;takes Topamax daily  . History of bronchitis    last time 4-56yrs ago  . History of colon polyps   . HSV-2 (herpes simplex virus 2) infection   . Hx of chlamydia infection   . Hypertension   . Hypertension    takes INderal and Clonidine daily  . IBS (irritable bowel syndrome)   . Insomnia    takes Ambien nightly  . Internal hemorrhoids   . Irregular periods 04/02/2014  . Joint swelling    right knee  . Mental disorder    takes Abilify as needed  . Obesity   . Panic attack   . Panic disorder   . Shortness of breath    with exertion  . Urinary urgency   . Weakness    numbness and tingling to left foot    Patient Active Problem List   Diagnosis Date Noted  . Bipolar 2 disorder, major depressive episode ([place]) 12/05/2015  . Intractable pain 12/17/2014  . Mass of breast, right 08/26/2014  . Chronic pain 06/13/2014  . Lumbar radiculopathy 04/23/2014  . Irregular  periods 04/02/2014  . Displacement of lumbar intervertebral disc without myelopathy 11/01/2013  . Contraceptive management 08/23/2013  . Superficial fungus infection of skin 08/23/2013  . HNP (herniated nucleus pulposus), lumbar 07/03/2013  . Right ovarian cyst 01/22/2013  . Chronic constipation 12/17/2012  . 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  . Unspecified essential hypertension 08/07/2012  . Unspecified constipation 08/07/2012  . Bipolar disorder, unspecified ([place]) 08/07/2012  . Attention deficit hyperactivity disorder 08/07/2012  . Panic disorder 08/07/2012  . Rectal bleeding 08/07/2012  . Abdominal pain, right upper quadrant 08/07/2012    Past Surgical History:  Procedure Laterality Date  . BACK SURGERY    . bunion removal    . BUNIONECTOMY Left    pins in big toe and 2nd toe  . CHOLECYSTECTOMY  10 yrs ago  . CHOLECYSTECTOMY  10+yrs ago  .  COLONOSCOPY N/A 08/22/2012   Procedure: COLONOSCOPY;  Surgeon: Rogene Houston, MD;  [facility];  Service: Endoscopy;  Laterality: N/A;  100  . COLONOSCOPY    . COLONOSCOPY WITH PROPOFOL N/A 09/25/2015   Procedure: COLONOSCOPY WITH PROPOFOL;  Surgeon: Rogene Houston, MD;  [facility];  Service: Endoscopy;  Laterality: N/A;  2:30-moved to 7:30 Ann notified pt  . epidural injections     x 2  . LUMBAR LAMINECTOMY/DECOMPRESSION MICRODISCECTOMY Left 07/03/2013   Procedure: LEFT LUMBAR THREE-FOUR microdiskectomy;  Surgeon: Winfield Cunas, MD;  Location: [place] [facility];  Service: Neurosurgery;  Laterality: Left;  LEFT LUMBAR THREE-FOUR microdiskectomy  . LUMBAR LAMINECTOMY/DECOMPRESSION MICRODISCECTOMY Left 11/29/2013   Procedure: LEFT Lumbar Four-Five Redo microdiskectomy;  Surgeon: Winfield Cunas, MD;  Location: [place] [facility];  Service: Neurosurgery;  Laterality: Left;  LEFT Lumbar Four-Five Redo microdiskectomy  . POLYPECTOMY  09/25/2015   Procedure: POLYPECTOMY;  Surgeon: Rogene Houston, MD;  [facility];  Service: Endoscopy;;  at cecum  . SPINAL CORD STIMULATOR INSERTION N/A 06/13/2014   Procedure: LUMBAR SPINAL CORD STIMULATOR INSERTION;  Surgeon: Ashok Pall, MD;  Location: [place] [facility];  Service: Neurosurgery;  Laterality: N/A;  permanent spinal cord stimulator insertion  . SPINAL CORD STIMULATOR REMOVAL N/A 12/17/2014   Procedure: THORACIC SPINAL CORD STIMULATOR REMOVAL;  Surgeon: Ashok Pall, MD;  Location: [place] [facility];  Service: Neurosurgery;  Laterality: N/A;  THORACIC SPINAL CORD STIMULATOR REMOVAL  . SPINAL CORD STIMULATOR TRIAL N/A 04/23/2014   Procedure: LUMBAR SPINAL CORD STIMULATOR TRIAL;  Surgeon: Ashok Pall, MD;  Location: [place] [facility];  Service: Neurosurgery;  Laterality: N/A;  Spinal Cord Stimulator Trial  . TONSILLECTOMY    . TONSILLECTOMY     as a child  . wisdom teeth extracted    . WISDOM TOOTH EXTRACTION      OB History    Gravida Para  Term Preterm AB Living   0             SAB TAB Ectopic Multiple Live Births                   Home Medications    Prior to Admission medications   Medication Sig Start Date End Date Taking? Authorizing Provider  albuterol (PROVENTIL HFA;VENTOLIN HFA) 108 (90 BASE) MCG/ACT inhaler Inhale 1-2 puffs into the lungs every 6 (six) hours as needed for wheezing. 04/23/15  Yes Roselyn Malachy Moan, MD  clotrimazole-betamethasone (LOTRISONE) cream Apply topically 2 (two) times daily. 12/07/15  Yes Derrill Center, NP  doxepin (SINEQUAN) 50 MG capsule Take 1 capsule (50  mg total) by mouth at bedtime. Patient taking differently: Take 50 mg by mouth 2 (two) times daily.  12/07/15  Yes Derrill Center, NP  gemfibrozil (LOPID) 600 MG tablet Take 0.5 tablets (300 mg total) by mouth 2 (two) times daily before a meal. 12/07/15  Yes Derrill Center, NP  lamoTRIgine (LAMICTAL) 25 MG tablet Take 1 tablet (25 mg total) by mouth daily. Patient taking differently: Take 100 mg by mouth daily.  12/07/15  Yes Derrill Center, NP  lubiprostone (AMITIZA) 24 MCG capsule Take 1 capsule (24 mcg total) by mouth daily with breakfast. 03/31/15  Yes Rogene Houston, MD  Olopatadine HCl (PATADAY) 0.2 % SOLN Place 1 drop into both eyes daily as needed (itchy eyes).   Yes Historical Provider, MD  omeprazole (PRILOSEC) 20 MG capsule Take 20 mg by mouth daily.   Yes Historical Provider, MD  medroxyPROGESTERone (DEPO-PROVERA) 150 MG/ML injection INJECT 1 VIAL INTRAMUSCULARLY EVERY 3 MONTHS IN OFFICE. 08/04/15   Estill Dooms, NP    Family History Family History  Problem Relation Age of Onset  . Diabetes Mother   . Thyroid disease Mother   . Other Mother     PTSD  . Hyperlipidemia Mother   . Hypertension Maternal Aunt   . Diabetes Maternal Aunt   . Thyroid disease Maternal Aunt   . Diabetes Maternal Grandmother   . Heart disease Maternal Grandmother     CHF  . Hypertension Maternal Grandmother   . Dementia Maternal Grandmother   .  Diabetes Father   . Hypertension Father   . Obesity Father   . Cancer Paternal Grandmother     breast  . Alcohol abuse Paternal Grandmother   . Crohn's disease Maternal Uncle   . Hyperlipidemia Maternal Uncle   . Other Maternal Uncle     back pain  . Dementia Maternal Uncle   . Diabetes Cousin   . Other Maternal Grandfather     lung transplant    Social History Social History  Substance Use Topics  . Smoking status: Current Some Day Smoker    Packs/day: 2.00    Years: 9.00    Types: Cigarettes  . Smokeless tobacco: Never Used     Comment: 1-2 cig per week  . Alcohol use No     Comment: occasionally- once per month      Allergies   Bee venom; Dilaudid [hydromorphone hcl]; Latex; Senna; and Adhesive [tape]   Review of Systems Review of Systems  Musculoskeletal: Positive for arthralgias and back pain.  Psychiatric/Behavioral:       Bipolar illness  All other systems reviewed and are negative.    Physical Exam Updated Vital Signs BP 127/77 (BP Location: Left Arm)   Pulse 117   Temp 98.1 F (36.7 C) (Temporal)   Resp 16   Ht 5\' 7"  (1.702 m)   Wt 93 kg   SpO2 98%   BMI 32.11 kg/m   Physical Exam  Constitutional: She is oriented to person, place, and time. She appears well-developed and well-nourished.  Non-toxic appearance.  HENT:  Head: Normocephalic.  Right Ear: Tympanic membrane and external ear normal.  Left Ear: Tympanic membrane and external ear normal.  Eyes: EOM and lids are normal. Pupils are equal, round, and reactive to light.  Neck: Normal range of motion. Neck supple. Carotid bruit is not present.  Cardiovascular: Normal rate, regular rhythm, normal heart sounds, intact distal pulses and normal pulses.   Pulmonary/Chest: Breath sounds normal. No  respiratory distress.  Abdominal: Soft. Bowel sounds are normal. There is no tenderness. There is no guarding.  Musculoskeletal: Normal range of motion.  There is no pain or hot areas along the lumbar  spine area. Positive straight leg raise at 20 on the left. There is pain to palpation near the sciatic notch on the right. There is no evidence for dislocation on of the left hip. There no temperature changes of the left lower extremity. The dorsalis pedis pulses 2+. Capillary refill is less than 2 seconds.  Lymphadenopathy:       Head (right side): No submandibular adenopathy present.       Head (left side): No submandibular adenopathy present.    She has no cervical adenopathy.  Neurological: She is alert and oriented to person, place, and time. She has normal strength. No cranial nerve deficit or sensory deficit.  Skin: Skin is warm and dry.  Psychiatric: She has a normal mood and affect. Her speech is normal.  Nursing note and vitals reviewed.    ED Treatments / Results  Labs (all labs ordered are listed, but only abnormal results are displayed) Labs Reviewed - No data to display  EKG  EKG Interpretation None       Radiology No results found.  Procedures Procedures (including critical care time)  Medications Ordered in ED Medications  diazepam (VALIUM) tablet 10 mg (not administered)  morphine 4 MG/ML injection 8 mg (not administered)  dexamethasone (DECADRON) injection 8 mg (not administered)  ondansetron (ZOFRAN) tablet 4 mg (not administered)     Initial Impression / Assessment and Plan / ED Course  I have reviewed the triage vital signs and the nursing notes.  Pertinent labs & imaging results that were available during my care of the patient were reviewed by me and considered in my medical decision making (see chart for details).  Clinical Course    *I have reviewed nursing notes, vital signs, and all appropriate lab and imaging results for this patient.**  Final Clinical Impressions(s) / ED Diagnoses  The examination favors sciatica. No evidence for caudal equina present. The patient states she has been seen by [facility] in the past. She does not have  insurance at this time. I have advised the patient to contact the social worker for possible resources, and/or assistance. The patient will be treated with Decadron, Flexeril, and Ultram.    Final diagnoses:  None    New Prescriptions New Prescriptions   No medications on file     Lily Kocher, PA-C 01/23/16 [place], DO 01/26/16 1515

## 2016-01-23 NOTE — ED Triage Notes (Addendum)
Pt comes in with left hip pain starting Wednesday. Pt has history of sciatic with flares. Pt states her pain starts in her left buttock and moves down her left leg. She states that she has intermittent tingling in this leg. She denies any new injury.

## 2016-02-01 ENCOUNTER — Other Ambulatory Visit (HOSPITAL_COMMUNITY): Payer: Self-pay | Admitting: Orthopedic Surgery

## 2016-02-01 DIAGNOSIS — M545 Low back pain: Secondary | ICD-10-CM

## 2016-02-03 ENCOUNTER — Other Ambulatory Visit (HOSPITAL_COMMUNITY)
Admission: RE | Admit: 2016-02-03 | Discharge: 2016-02-03 | Disposition: A | Discharge status: Home / Self Care | Payer: Self-pay | Source: Ambulatory Visit | Attending: Obstetrics & Gynecology | Admitting: Obstetrics & Gynecology

## 2016-02-03 ENCOUNTER — Encounter: Payer: Self-pay | Admitting: Women's Health

## 2016-02-03 ENCOUNTER — Ambulatory Visit (INDEPENDENT_AMBULATORY_CARE_PROVIDER_SITE_OTHER): Payer: Medicaid Other | Admitting: Women's Health

## 2016-02-03 VITALS — BP 118/74 | HR 104 | Wt 213.0 lb

## 2016-02-03 DIAGNOSIS — Z113 Encounter for screening for infections with a predominantly sexual mode of transmission: Secondary | ICD-10-CM | POA: Insufficient documentation

## 2016-02-03 DIAGNOSIS — Z308 Encounter for other contraceptive management: Secondary | ICD-10-CM | POA: Diagnosis not present

## 2016-02-03 DIAGNOSIS — Z01419 Encounter for gynecological examination (general) (routine) without abnormal findings: Secondary | ICD-10-CM

## 2016-02-03 DIAGNOSIS — F17200 Nicotine dependence, unspecified, uncomplicated: Secondary | ICD-10-CM

## 2016-02-03 DIAGNOSIS — Z1151 Encounter for screening for human papillomavirus (HPV): Secondary | ICD-10-CM | POA: Insufficient documentation

## 2016-02-03 NOTE — Progress Notes (Signed)
Subjective:   Sharon Vazquez is a 35 y.o. G0P0 African American female here for a routine FP [facility] well-woman exam.  No LMP recorded. Patient has had an injection.    Current complaints: epigastric pain x 2 d, on prilosec 20mg  daily, feels like it could be pulled muscle- doesn't remember doing anything to hurt it. Denies sob, n/v, radiation of pain to jaw/arm, etc. PCP: none- was seeing [facility] but they dropped her insurance, states her psychiatrist manages all of her chronic conditions/meds       Reports she had normal screening labs (thyroid, diabetes, etc) in Jan w/ [facility]  Social History: Sexual: heterosexual Marital Status: separated Tobacco: smokes 1.5-2ppd not interested in quitting at this time  The following portions of the patient's history were reviewed and updated as appropriate: allergies, current medications, past family history, past medical history, past social history, past surgical history and problem list.  Past Medical History Past Medical History:  Diagnosis Date  . Abnormal Pap smear   . ADHD (attention deficit hyperactivity disorder)   . Anxiety   . Anxiety    takes Xanax daily as needed  . Arthritis    degenerative spine  . Asthma    Ventolin as needed and QVAR takes daily  . Back spasm    takes Flexeril daily as needed  . Bipolar 1 disorder ([place])   . Bipolar 1 disorder ([facility])    takes DOxepin daily  . Borderline personality disorder   . Chronic back pain    HNP  . Constipation    takes Dulcolax daily as needed takes Amitiza daily  . Contraceptive management 08/23/2013  . Eczema    has 2 creams uses as needed  . GERD (gastroesophageal reflux disease)    takes Tums as needed  . Headache(784.0)   . Headache(784.0)    migraines-last one about 48months ago;takes Topamax daily  . History of bronchitis    last time 4-14yrs ago  . History of colon polyps   . HSV-2 (herpes simplex virus 2) infection   . Hx of chlamydia infection   .  Hypertension   . Hypertension    takes INderal and Clonidine daily  . IBS (irritable bowel syndrome)   . Insomnia    takes Ambien nightly  . Internal hemorrhoids   . Irregular periods 04/02/2014  . Joint swelling    right knee  . Mental disorder    takes Abilify as needed  . Obesity   . Panic attack   . Panic disorder   . Sciatica   . Shortness of breath    with exertion  . Urinary urgency   . Weakness    numbness and tingling to left foot    Past Surgical History Past Surgical History:  Procedure Laterality Date  . BACK SURGERY    . bunion removal    . BUNIONECTOMY Left    pins in big toe and 2nd toe  . CHOLECYSTECTOMY  10 yrs ago  . CHOLECYSTECTOMY  10+yrs ago  . COLONOSCOPY N/A 08/22/2012   Procedure: COLONOSCOPY;  Surgeon: Rogene Houston, MD;  [facility];  Service: Endoscopy;  Laterality: N/A;  100  . COLONOSCOPY    . COLONOSCOPY WITH PROPOFOL N/A 09/25/2015   Procedure: COLONOSCOPY WITH PROPOFOL;  Surgeon: Rogene Houston, MD;  [facility];  Service: Endoscopy;  Laterality: N/A;  2:30-moved to 7:30 Ann notified pt  . epidural injections     x 2  .  LUMBAR LAMINECTOMY/DECOMPRESSION MICRODISCECTOMY Left 07/03/2013   Procedure: LEFT LUMBAR THREE-FOUR microdiskectomy;  Surgeon: Winfield Cunas, MD;  Location: [place] NE[facility] OR[facility];  Service: Neurosurgery;  Laterality: Left;  LEFT LUMBAR THREE-FOUR microdiskectomy  . LUMBAR LAMINECTOMY/DECOMPRESSION MICRODISCECTOMY Left 11/29/2013   Procedure: LEFT Lumbar Four-Five Redo microdiskectomy;  Surgeon: Winfield Cunas, MD;  Location: [place] NEUR[facility] ORS;  Service: Neurosurgery;  Laterality: Left;  LEFT Lumbar Four-Five Redo microdiskectomy  . POLYPECTOMY  09/25/2015   Procedure: POLYPECTOMY;  Surgeon: Rogene Houston, MD;  [facility];  Service: Endoscopy;;  at cecum  . SPINAL CORD STIMULATOR INSERTION N/A 06/13/2014   Procedure: LUMBAR SPINAL CORD STIMULATOR INSERTION;  Surgeon: Ashok Pall, MD;  Location: [place]  NEURO ORS;  Service: Neurosurgery;  Laterality: N/A;  permanent spinal cord stimulator insertion  . SPINAL CORD STIMULATOR REMOVAL N/A 12/17/2014   Procedure: THORACIC SPINAL CORD STIMULATOR REMOVAL;  Surgeon: Ashok Pall, MD;  Location: [place] NE[facility] OR[facility];  Service: Neurosurgery;  Laterality: N/A;  THORACIC SPINAL CORD STIMULATOR REMOVAL  . SPINAL CORD STIMULATOR TRIAL N/A 04/23/2014   Procedure: LUMBAR SPINAL CORD STIMULATOR TRIAL;  Surgeon: Ashok Pall, MD;  Location: [place] [facility] OR[facility];  Service: Neurosurgery;  Laterality: N/A;  Spinal Cord Stimulator Trial  . TONSILLECTOMY    . TONSILLECTOMY     as a child  . wisdom teeth extracted    . WISDOM TOOTH EXTRACTION      Gynecologic History G0P0  No LMP recorded. Patient has had an injection. Contraception: Depo-Provera injections Last Pap: 06/2012. Results were: neg w/ -HRHPV Last mammogram: 08/2014. Results were: normal Last TCS: never  Obstetric History OB History  Gravida Para Term Preterm AB Living  0            SAB TAB Ectopic Multiple Live Births                   Current Medications Current Outpatient Prescriptions on File Prior to Visit  Medication Sig Dispense Refill  . albuterol (PROVENTIL HFA;VENTOLIN HFA) 108 (90 BASE) MCG/ACT inhaler Inhale 1-2 puffs into the lungs every 6 (six) hours as needed for wheezing. 1 Inhaler 0  . clotrimazole-betamethasone (LOTRISONE) cream Apply topically 2 (two) times daily. 30 g 0  . cyclobenzaprine (FLEXERIL) 10 MG tablet Take 1 tablet (10 mg total) by mouth 3 (three) times daily. 20 tablet 0  . dexamethasone (DECADRON) 4 MG tablet Take 1 tablet (4 mg total) by mouth 2 (two) times daily with a meal. 12 tablet 0  . doxepin (SINEQUAN) 50 MG capsule Take 1 capsule (50 mg total) by mouth at bedtime. (Patient taking differently: Take 50 mg by mouth 2 (two) times daily. ) 30 capsule 0  . gemfibrozil (LOPID) 600 MG tablet Take 0.5 tablets (300 mg total) by mouth 2 (two) times daily before a meal. 90  tablet 0  . lamoTRIgine (LAMICTAL) 25 MG tablet Take 1 tablet (25 mg total) by mouth daily. (Patient taking differently: Take 100 mg by mouth daily. ) 30 tablet 0  . lubiprostone (AMITIZA) 24 MCG capsule Take 1 capsule (24 mcg total) by mouth daily with breakfast. 30 capsule 5  . medroxyPROGESTERone (DEPO-PROVERA) 150 MG/ML injection INJECT 1 VIAL INTRAMUSCULARLY EVERY 3 MONTHS IN OFFICE. 1 mL 0  . Olopatadine HCl (PATADAY) 0.2 % SOLN Place 1 drop into both eyes daily as needed (itchy eyes).    Marland Kitchen omeprazole (PRILOSEC) 20 MG capsule Take 20 mg by mouth daily.    . traMADol (ULTRAM) 50 MG tablet  Take 1 tablet (50 mg total) by mouth every 6 (six) hours as needed. 15 tablet 0  . diclofenac (VOLTAREN) 75 MG EC tablet Take 1 tablet (75 mg total) by mouth 2 (two) times daily. (Patient not taking: Reported on 02/03/2016) 14 tablet 0   No current facility-administered medications on file prior to visit.     Review of Systems Patient denies any headaches, blurred vision, shortness of breath, chest pain, abdominal pain, problems with bowel movements, urination, or intercourse.  Objective:  BP 118/74 (BP Location: Right Arm, Patient Position: Sitting, Cuff Size: Large)   Pulse (!) 104   Wt 213 lb (96.6 kg)   BMI 33.36 kg/m  Physical Exam  General:  Well developed, well nourished, no acute distress. She is alert and oriented x3. Skin:  Warm and dry Neck:  Midline trachea, no thyromegaly or nodules Cardiovascular: Regular rate and rhythm, no murmur heard Tender to palpation epigastric/sternal area most c/w musculoskeletal pain Lungs:  Effort normal, all lung fields clear to auscultation bilaterally Breasts:  No dominant palpable mass, retraction, or nipple discharge Abdomen:  Soft, non tender, no hepatosplenomegaly or masses Pelvic:  External genitalia is normal in appearance.  The vagina is normal in appearance. The cervix is bulbous, no CMT.  Thin prep pap is done w/ HR HPV cotesting. Uterus is felt  to be normal size, shape, and contour.  No adnexal masses or tenderness noted. Extremities:  No swelling or varicosities noted Psych:  She has a normal mood and affect  Assessment:   Healthy well-woman exam STI screening Epigastric/sternal musculoskeletal pain, no cardiac sx/area tender to palpation Smoker  Plan:  GC/CT from pap, HIV, RPR today Can try increasing prilosec to 40mg  daily for 1 week to see if helps w/ pain, most likely musculoskeletal- can try ice pack/time, discussed cardiac sx- to seek care at ED if any of these develop Advised smoking cessation, discussed risks to herself w/ continued heavy smoking- offered QuitlineNC- declined F/U tomorrow for depo, then 40yr for physical, or sooner if needed Mammogram @35yo  or sooner if problems Colonoscopy @35yo  or sooner if problems  Tawnya Crook CNM, [facility] 02/03/2016 2:36 PM

## 2016-02-03 NOTE — Patient Instructions (Signed)

## 2016-02-04 ENCOUNTER — Ambulatory Visit (INDEPENDENT_AMBULATORY_CARE_PROVIDER_SITE_OTHER): Payer: Medicaid Other | Admitting: *Deleted

## 2016-02-04 DIAGNOSIS — Z308 Encounter for other contraceptive management: Secondary | ICD-10-CM | POA: Diagnosis not present

## 2016-02-04 DIAGNOSIS — Z3042 Encounter for surveillance of injectable contraceptive: Secondary | ICD-10-CM

## 2016-02-04 LAB — RPR: RPR: NONREACTIVE

## 2016-02-04 LAB — HIV ANTIBODY (ROUTINE TESTING W REFLEX): HIV Screen 4th Generation wRfx: NONREACTIVE

## 2016-02-04 LAB — POCT URINE PREGNANCY: PREG TEST UR: NEGATIVE

## 2016-02-04 MED ORDER — MEDROXYPROGESTERONE ACETATE 150 MG/ML IM SUSP
150.0000 mg | Freq: Once | INTRAMUSCULAR | Status: AC
  Administered 2016-02-04: 150 mg via INTRAMUSCULAR

## 2016-02-04 NOTE — Progress Notes (Signed)
Depo Provera 150 mg IM given in left deltoid with no complications, negative pregnancy test. Pt to return in 12 weeks for next injection.

## 2016-02-05 ENCOUNTER — Ambulatory Visit (HOSPITAL_COMMUNITY)
Admission: RE | Admit: 2016-02-05 | Discharge: 2016-02-05 | Disposition: A | Discharge status: Home / Self Care | Payer: Self-pay | Source: Ambulatory Visit | Attending: Orthopedic Surgery | Admitting: Orthopedic Surgery

## 2016-02-05 DIAGNOSIS — M5126 Other intervertebral disc displacement, lumbar region: Secondary | ICD-10-CM | POA: Insufficient documentation

## 2016-02-05 DIAGNOSIS — M5136 Other intervertebral disc degeneration, lumbar region: Secondary | ICD-10-CM | POA: Insufficient documentation

## 2016-02-05 DIAGNOSIS — M4806 Spinal stenosis, lumbar region: Secondary | ICD-10-CM | POA: Insufficient documentation

## 2016-02-05 DIAGNOSIS — M545 Low back pain: Secondary | ICD-10-CM

## 2016-02-05 LAB — CYTOLOGY - PAP

## 2016-02-11 ENCOUNTER — Emergency Department (HOSPITAL_COMMUNITY)
Admission: EM | Admit: 2016-02-11 | Discharge: 2016-02-11 | Disposition: A | Discharge status: Home / Self Care | Payer: BLUE CROSS/BLUE SHIELD | Attending: Emergency Medicine | Admitting: Emergency Medicine

## 2016-02-11 ENCOUNTER — Encounter (HOSPITAL_COMMUNITY): Payer: Self-pay | Admitting: Emergency Medicine

## 2016-02-11 DIAGNOSIS — F17210 Nicotine dependence, cigarettes, uncomplicated: Secondary | ICD-10-CM | POA: Insufficient documentation

## 2016-02-11 DIAGNOSIS — R000 Tachycardia, unspecified: Secondary | ICD-10-CM

## 2016-02-11 DIAGNOSIS — Z79899 Other long term (current) drug therapy: Secondary | ICD-10-CM | POA: Insufficient documentation

## 2016-02-11 DIAGNOSIS — F909 Attention-deficit hyperactivity disorder, unspecified type: Secondary | ICD-10-CM | POA: Insufficient documentation

## 2016-02-11 DIAGNOSIS — I10 Essential (primary) hypertension: Secondary | ICD-10-CM | POA: Insufficient documentation

## 2016-02-11 DIAGNOSIS — J45909 Unspecified asthma, uncomplicated: Secondary | ICD-10-CM | POA: Insufficient documentation

## 2016-02-11 LAB — CBC WITH DIFFERENTIAL/PLATELET
BASOS ABS: 0 10*3/uL (ref 0.0–0.1)
Basophils Relative: 0 %
EOS ABS: 0.3 10*3/uL (ref 0.0–0.7)
EOS PCT: 3 %
HCT: 38.4 % (ref 36.0–46.0)
Hemoglobin: 13.3 g/dL (ref 12.0–15.0)
Lymphocytes Relative: 34 %
Lymphs Abs: 3.8 10*3/uL (ref 0.7–4.0)
MCH: 32.3 pg (ref 26.0–34.0)
MCHC: 34.6 g/dL (ref 30.0–36.0)
MCV: 93.2 fL (ref 78.0–100.0)
MONO ABS: 0.7 10*3/uL (ref 0.1–1.0)
Monocytes Relative: 6 %
Neutro Abs: 6.3 10*3/uL (ref 1.7–7.7)
Neutrophils Relative %: 57 %
PLATELETS: 325 10*3/uL (ref 150–400)
RBC: 4.12 MIL/uL (ref 3.87–5.11)
RDW: 14.2 % (ref 11.5–15.5)
WBC: 11.1 10*3/uL — AB (ref 4.0–10.5)

## 2016-02-11 LAB — BASIC METABOLIC PANEL
ANION GAP: 7 (ref 5–15)
BUN: 6 mg/dL (ref 6–20)
CALCIUM: 8.7 mg/dL — AB (ref 8.9–10.3)
CO2: 27 mmol/L (ref 22–32)
Chloride: 102 mmol/L (ref 101–111)
Creatinine, Ser: 0.88 mg/dL (ref 0.44–1.00)
GFR calc Af Amer: >60 mL/min (ref >60)
Glucose, Bld: 106 mg/dL — ABNORMAL HIGH (ref 65–99)
POTASSIUM: 3.8 mmol/L (ref 3.5–5.1)
SODIUM: 136 mmol/L (ref 135–145)

## 2016-02-11 LAB — TSH: TSH: 0.574 u[IU]/mL (ref 0.350–4.500)

## 2016-02-11 LAB — URINALYSIS, ROUTINE W REFLEX MICROSCOPIC
Bilirubin Urine: NEGATIVE
GLUCOSE, UA: NEGATIVE mg/dL
HGB URINE DIPSTICK: NEGATIVE
KETONES UR: NEGATIVE mg/dL
LEUKOCYTES UA: NEGATIVE
Nitrite: NEGATIVE
PROTEIN: NEGATIVE mg/dL
Specific Gravity, Urine: 1.02 (ref 1.005–1.030)
pH: 5.5 (ref 5.0–8.0)

## 2016-02-11 NOTE — Discharge Instructions (Signed)
Tests show no life-threatening condition. Stop smoking. Try to lose weight. Her pulse was above normal today [112].  This will need to be rechecked.

## 2016-02-11 NOTE — ED Provider Notes (Signed)
[place] DEPT Provider Note   CSN: [id number] Arrival date & time: 02/11/16  1510     History   Chief Complaint Chief Complaint  Patient presents with  . Hypertension    HPI Sharon Vazquez is a 35 y.o. female.  Patient is concerned about her blood pressure. Today she states it was 135/105 with a pulse of 130 at home. She feels dizzy. No chest pain or dyspnea. She is obese. She has had multiple lower back surgeries and associated sciatica. She takes the Depo shot for birth control. She is on clonidine and propranolol for blood pressure.      Past Medical History:  Diagnosis Date  . Abnormal Pap smear   . ADHD (attention deficit hyperactivity disorder)   . Anxiety   . Anxiety    takes Xanax daily as needed  . Arthritis    degenerative spine  . Asthma    Ventolin as needed and QVAR takes daily  . Back spasm    takes Flexeril daily as needed  . Bipolar 1 disorder ([place])   . Bipolar 1 disorder ([facility])    takes DOxepin daily  . Borderline personality disorder   . Chronic back pain    HNP  . Constipation    takes Dulcolax daily as needed takes Amitiza daily  . Contraceptive management 08/23/2013  . Eczema    has 2 creams uses as needed  . GERD (gastroesophageal reflux disease)    takes Tums as needed  . Headache(784.0)   . Headache(784.0)    migraines-last one about 77months ago;takes Topamax daily  . History of bronchitis    last time 4-27yrs ago  . History of colon polyps   . HSV-2 (herpes simplex virus 2) infection   . Hx of chlamydia infection   . Hypertension   . Hypertension    takes INderal and Clonidine daily  . IBS (irritable bowel syndrome)   . Insomnia    takes Ambien nightly  . Internal hemorrhoids   . Irregular periods 04/02/2014  . Joint swelling    right knee  . Mental disorder    takes Abilify as needed  . Obesity   . Panic attack   . Panic disorder   . Sciatica   . Shortness of breath    with exertion  . Urinary urgency   .  Weakness    numbness and tingling to left foot    Patient Active Problem List   Diagnosis Date Noted  . Bipolar 2 disorder, major depressive episode ([place]) 12/05/2015  . Intractable pain 12/17/2014  . Mass of breast, right 08/26/2014  . Chronic pain 06/13/2014  . Lumbar radiculopathy 04/23/2014  . Irregular periods 04/02/2014  . Displacement of lumbar intervertebral disc without myelopathy 11/01/2013  . Contraceptive management 08/23/2013  . Superficial fungus infection of skin 08/23/2013  . HNP (herniated nucleus pulposus), lumbar 07/03/2013  . Right ovarian cyst 01/22/2013  . Chronic constipation 12/17/2012  . 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  . Unspecified essential hypertension 08/07/2012  . Unspecified constipation 08/07/2012  . Bipolar disorder, unspecified ([place]) 08/07/2012  . Attention deficit hyperactivity disorder 08/07/2012  . Panic disorder 08/07/2012  . Rectal bleeding 08/07/2012  . Abdominal pain, right upper quadrant 08/07/2012    Past Surgical History:  Procedure Laterality Date  . BACK SURGERY    . bunion removal    . BUNIONECTOMY Left    pins in big toe and 2nd toe  . CHOLECYSTECTOMY  10 yrs ago  .  CHOLECYSTECTOMY  10+yrs ago  . COLONOSCOPY N/A 08/22/2012   Procedure: COLONOSCOPY;  Surgeon: Rogene Houston, MD;  [facility];  Service: Endoscopy;  Laterality: N/A;  100  . COLONOSCOPY    . COLONOSCOPY WITH PROPOFOL N/A 09/25/2015   Procedure: COLONOSCOPY WITH PROPOFOL;  Surgeon: Rogene Houston, MD;  Location: [facility];  Service: Endoscopy;  Laterality: N/A;  2:30-moved to 7:30 Ann notified pt  . epidural injections     x 2  . LUMBAR LAMINECTOMY/DECOMPRESSION MICRODISCECTOMY Left 07/03/2013   Procedure: LEFT LUMBAR THREE-FOUR microdiskectomy;  Surgeon: Winfield Cunas, MD;  Location: [place] [facility];  Service: Neurosurgery;  Laterality: Left;  LEFT LUMBAR THREE-FOUR microdiskectomy  . LUMBAR LAMINECTOMY/DECOMPRESSION  MICRODISCECTOMY Left 11/29/2013   Procedure: LEFT Lumbar Four-Five Redo microdiskectomy;  Surgeon: Winfield Cunas, MD;  Location: [place] [facility];  Service: Neurosurgery;  Laterality: Left;  LEFT Lumbar Four-Five Redo microdiskectomy  . POLYPECTOMY  09/25/2015   Procedure: POLYPECTOMY;  Surgeon: Rogene Houston, MD;  Location: [facility];  Service: Endoscopy;;  at cecum  . SPINAL CORD STIMULATOR INSERTION N/A 06/13/2014   Procedure: LUMBAR SPINAL CORD STIMULATOR INSERTION;  Surgeon: Ashok Pall, MD;  Location: [place] [facility];  Service: Neurosurgery;  Laterality: N/A;  permanent spinal cord stimulator insertion  . SPINAL CORD STIMULATOR REMOVAL N/A 12/17/2014   Procedure: THORACIC SPINAL CORD STIMULATOR REMOVAL;  Surgeon: Ashok Pall, MD;  Location: [place] [facility];  Service: Neurosurgery;  Laterality: N/A;  THORACIC SPINAL CORD STIMULATOR REMOVAL  . SPINAL CORD STIMULATOR TRIAL N/A 04/23/2014   Procedure: LUMBAR SPINAL CORD STIMULATOR TRIAL;  Surgeon: Ashok Pall, MD;  Location: [place] [facility];  Service: Neurosurgery;  Laterality: N/A;  Spinal Cord Stimulator Trial  . TONSILLECTOMY    . TONSILLECTOMY     as a child  . wisdom teeth extracted    . WISDOM TOOTH EXTRACTION      OB History    Gravida Para Term Preterm AB Living   0             SAB TAB Ectopic Multiple Live Births                   Home Medications    Prior to Admission medications   Medication Sig Start Date End Date Taking? Authorizing Provider  albuterol (PROVENTIL HFA;VENTOLIN HFA) 108 (90 BASE) MCG/ACT inhaler Inhale 1-2 puffs into the lungs every 6 (six) hours as needed for wheezing. 04/23/15  Yes Roselyn Malachy Moan, MD  clotrimazole-betamethasone (LOTRISONE) cream Apply topically 2 (two) times daily. 12/07/15  Yes Derrill Center, NP  cyclobenzaprine (FLEXERIL) 10 MG tablet Take 1 tablet (10 mg total) by mouth 3 (three) times daily. 01/23/16  Yes Lily Kocher, PA-C  dexamethasone (DECADRON) 4 MG tablet Take 1 tablet (4 mg total)  by mouth 2 (two) times daily with a meal. 01/23/16  Yes Lily Kocher, PA-C  doxepin (SINEQUAN) 50 MG capsule Take 1 capsule (50 mg total) by mouth at bedtime. Patient taking differently: Take 50 mg by mouth 2 (two) times daily.  12/07/15  Yes Derrill Center, NP  gemfibrozil (LOPID) 600 MG tablet Take 0.5 tablets (300 mg total) by mouth 2 (two) times daily before a meal. 12/07/15  Yes Derrill Center, NP  lamoTRIgine (LAMICTAL) 25 MG tablet Take 1 tablet (25 mg total) by mouth daily. Patient taking differently: Take 100 mg by mouth daily.  12/07/15  Yes Derrill Center, NP  lubiprostone (AMITIZA) 24  MCG capsule Take 1 capsule (24 mcg total) by mouth daily with breakfast. Patient taking differently: Take 24 mcg by mouth 2 (two) times daily with a meal.  03/31/15  Yes Rogene Houston, MD  medroxyPROGESTERone (DEPO-PROVERA) 150 MG/ML injection INJECT 1 VIAL INTRAMUSCULARLY EVERY 3 MONTHS IN OFFICE. 08/04/15  Yes Estill Dooms, NP  Olopatadine HCl (PATADAY) 0.2 % SOLN Place 1 drop into both eyes daily as needed (itchy eyes).   Yes Historical Provider, MD  omeprazole (PRILOSEC) 20 MG capsule Take 20 mg by mouth daily.   Yes Historical Provider, MD  tiZANidine (ZANAFLEX) 4 MG tablet take 1 tablet by mouth every 8 hours to every 12 hours if needed for muscle spasm 02/01/16  Yes Historical Provider, MD  traMADol (ULTRAM) 50 MG tablet Take 1 tablet (50 mg total) by mouth every 6 (six) hours as needed. 01/23/16  Yes Lily Kocher, PA-C  diclofenac (VOLTAREN) 75 MG EC tablet Take 1 tablet (75 mg total) by mouth 2 (two) times daily. Patient not taking: Reported on 02/03/2016 01/23/16   Lily Kocher, PA-C    Family History Family History  Problem Relation Age of Onset  . Diabetes Mother   . Thyroid disease Mother   . Other Mother     PTSD  . Hyperlipidemia Mother   . Hypertension Maternal Aunt   . Diabetes Maternal Aunt   . Thyroid disease Maternal Aunt   . Diabetes Maternal Grandmother   . Heart disease  Maternal Grandmother     CHF  . Hypertension Maternal Grandmother   . Dementia Maternal Grandmother   . Diabetes Father   . Hypertension Father   . Obesity Father   . Cancer Paternal Grandmother     breast  . Alcohol abuse Paternal Grandmother   . Crohn's disease Maternal Uncle   . Hyperlipidemia Maternal Uncle   . Other Maternal Uncle     back pain  . Dementia Maternal Uncle   . Diabetes Cousin   . Other Maternal Grandfather     lung transplant    Social History Social History  Substance Use Topics  . Smoking status: Current Every Day Smoker    Packs/day: 2.00    Years: 9.00    Types: Cigarettes  . Smokeless tobacco: Never Used     Comment: 1-2 cig per week  . Alcohol use No     Comment: occasionally- once per month      Allergies   Bee venom; Dilaudid [hydromorphone hcl]; Latex; Senna; and Adhesive [tape]   Review of Systems Review of Systems  All other systems reviewed and are negative.    Physical Exam Updated Vital Signs BP 144/99   Pulse 114   Temp 98 F (36.7 C) (Oral)   Resp 25   Ht 5\' 7"  (1.702 m)   Wt 213 lb (96.6 kg)   SpO2 97%   BMI 33.36 kg/m   Physical Exam  Constitutional: She is oriented to person, place, and time. She appears well-developed and well-nourished.  HENT:  Head: Normocephalic and atraumatic.  Eyes: Conjunctivae are normal.  Neck: Neck supple.  Cardiovascular: Regular rhythm.   Tachycardic  Pulmonary/Chest: Effort normal and breath sounds normal.  Abdominal: Soft. Bowel sounds are normal.  Musculoskeletal: Normal range of motion.  Neurological: She is alert and oriented to person, place, and time.  Skin: Skin is warm and dry.  Psychiatric: She has a normal mood and affect. Her behavior is normal.  Nursing note and vitals reviewed.  ED Treatments / Results  Labs (all labs ordered are listed, but only abnormal results are displayed) Labs Reviewed  CBC WITH DIFFERENTIAL/PLATELET - Abnormal; Notable for the  following:       Result Value   WBC 11.1 (*)    All other components within normal limits  BASIC METABOLIC PANEL - Abnormal; Notable for the following:    Glucose, Bld 106 (*)    Calcium 8.7 (*)    All other components within normal limits  URINALYSIS, ROUTINE W REFLEX MICROSCOPIC (NOT AT [facility])  TSH    EKG  EKG Interpretation  Date/Time:  Thursday February 11 2016 18:29:37 EDT Ventricular Rate:  109 PR Interval:    QRS Duration: 75 QT Interval:  322 QTC Calculation: 434 R Axis:   62 Text Interpretation:  Sinus tachycardia Confirmed by Lacinda Axon  MD, Daemon Dowty ([id number]) on 02/11/2016 6:31:47 PM       Radiology No results found.  Procedures Procedures (including critical care time)  Medications Ordered in ED Medications - No data to display   Initial Impression / Assessment and Plan / ED Course  I have reviewed the triage vital signs and the nursing notes.  Pertinent labs & imaging results that were available during my care of the patient were reviewed by me and considered in my medical decision making (see chart for details).  Clinical Course    Patient was tachycardic on her last ER visit on 01/26/16. She is in no acute distress. Hemoglobin, glucose, TSH all normal. Encouraged her to lose weight and stop smoking.  Final Clinical Impressions(s) / ED Diagnoses   Final diagnoses:  Sinus tachycardia [facility])    New Prescriptions New Prescriptions   No medications on file     Nat Christen, MD 02/11/16 2018

## 2016-02-11 NOTE — ED Notes (Signed)
Pt reports feeling dizzy  And occasionally seeing spots for the last 4 days.  Pt went and saw her PCP this morning who did an ECG and told her to come to the ED dude to a high HR of 120.

## 2016-02-11 NOTE — ED Triage Notes (Signed)
PT states she was sent to ED by her PCP today d/t HTN. PT denies any SOB or chest pain at this time.

## 2016-02-27 ENCOUNTER — Encounter (HOSPITAL_COMMUNITY): Payer: Self-pay | Admitting: Emergency Medicine

## 2016-02-27 ENCOUNTER — Emergency Department (HOSPITAL_COMMUNITY)
Admission: EM | Admit: 2016-02-27 | Discharge: 2016-02-27 | Disposition: A | Discharge status: Home / Self Care | Payer: BLUE CROSS/BLUE SHIELD | Attending: Emergency Medicine | Admitting: Emergency Medicine

## 2016-02-27 DIAGNOSIS — F17210 Nicotine dependence, cigarettes, uncomplicated: Secondary | ICD-10-CM | POA: Insufficient documentation

## 2016-02-27 DIAGNOSIS — J45909 Unspecified asthma, uncomplicated: Secondary | ICD-10-CM | POA: Insufficient documentation

## 2016-02-27 DIAGNOSIS — M5416 Radiculopathy, lumbar region: Secondary | ICD-10-CM | POA: Insufficient documentation

## 2016-02-27 DIAGNOSIS — F909 Attention-deficit hyperactivity disorder, unspecified type: Secondary | ICD-10-CM | POA: Insufficient documentation

## 2016-02-27 DIAGNOSIS — Z79899 Other long term (current) drug therapy: Secondary | ICD-10-CM | POA: Insufficient documentation

## 2016-02-27 DIAGNOSIS — I10 Essential (primary) hypertension: Secondary | ICD-10-CM | POA: Insufficient documentation

## 2016-02-27 MED ORDER — DICLOFENAC SODIUM 75 MG PO TBEC: 75.0000 mg | DELAYED_RELEASE_TABLET | Freq: Two times a day (BID) | ORAL | 0 refills | Status: DC

## 2016-02-27 MED ORDER — DEXAMETHASONE SODIUM PHOSPHATE 4 MG/ML IJ SOLN
10.0000 mg | Freq: Once | INTRAMUSCULAR | Status: AC
  Administered 2016-02-27: 10 mg via INTRAMUSCULAR
  Filled 2016-02-27: qty 3

## 2016-02-27 MED ORDER — MORPHINE SULFATE (PF) 4 MG/ML IV SOLN
6.0000 mg | Freq: Once | INTRAVENOUS | Status: AC
  Administered 2016-02-27: 6 mg via INTRAMUSCULAR
  Filled 2016-02-27: qty 2

## 2016-02-27 MED ORDER — METHOCARBAMOL 500 MG PO TABS
500.0000 mg | ORAL_TABLET | Freq: Once | ORAL | Status: AC
  Administered 2016-02-27: 500 mg via ORAL
  Filled 2016-02-27: qty 1

## 2016-02-27 NOTE — ED Provider Notes (Signed)
[facility] DEPT Provider Note   CSN: [id number] Arrival date & time: 02/27/16  0840     History   Chief Complaint Chief Complaint  Patient presents with  . Leg Pain    HPI Sharon Vazquez is a 35 y.o. female.  HPI   Sharon Vazquez is a 35 y.o. female with a history of chronic low back pain and previous lumbar laminectomy, who presents to the Emergency Department complaining of persistent low back pain since August. She states pain is associated with intermittent numbness of her left leg. Numbness is associated with certain positions and resolves at rest. Patient has been seen here previously for same and is also been seen by Dr. Lynann Bologna. She had an MRI of her lower back on 02/05/2016 that shows a disc protrusion with nerve impingement.  Patient states that she was advised that she needs surgery for the impingement of the nerve, but states that she doesn't have insurance and is currently waiting for approval of her Medicaid and she is also applied for disability.  She denies recent injury, abdominal pain, urine or bowel changes, fever or chills.  Pain is similar to previous. She has been taking muscle relaxers with minimal improvement.   Past Medical History:  Diagnosis Date  . Abnormal Pap smear   . ADHD (attention deficit hyperactivity disorder)   . Anxiety   . Anxiety    takes Xanax daily as needed  . Arthritis    degenerative spine  . Asthma    Ventolin as needed and QVAR takes daily  . Back spasm    takes Flexeril daily as needed  . Bipolar 1 disorder ([place])   . Bipolar 1 disorder ([facility])    takes DOxepin daily  . Borderline personality disorder   . Chronic back pain    HNP  . Constipation    takes Dulcolax daily as needed takes Amitiza daily  . Contraceptive management 08/23/2013  . Eczema    has 2 creams uses as needed  . GERD (gastroesophageal reflux disease)    takes Tums as needed  . Headache(784.0)   . Headache(784.0)    migraines-last one about  48months ago;takes Topamax daily  . History of bronchitis    last time 4-9yrs ago  . History of colon polyps   . HSV-2 (herpes simplex virus 2) infection   . Hx of chlamydia infection   . Hypertension   . Hypertension    takes INderal and Clonidine daily  . IBS (irritable bowel syndrome)   . Insomnia    takes Ambien nightly  . Internal hemorrhoids   . Irregular periods 04/02/2014  . Joint swelling    right knee  . Mental disorder    takes Abilify as needed  . Obesity   . Panic attack   . Panic disorder   . Sciatica   . Shortness of breath    with exertion  . Urinary urgency   . Weakness    numbness and tingling to left foot    Patient Active Problem List   Diagnosis Date Noted  . Bipolar 2 disorder, major depressive episode ([place]) 12/05/2015  . Intractable pain 12/17/2014  . Mass of breast, right 08/26/2014  . Chronic pain 06/13/2014  . Lumbar radiculopathy 04/23/2014  . Irregular periods 04/02/2014  . Displacement of lumbar intervertebral disc without myelopathy 11/01/2013  . Contraceptive management 08/23/2013  . Superficial fungus infection of skin 08/23/2013  . HNP (herniated nucleus pulposus), lumbar 07/03/2013  . Right ovarian  cyst 01/22/2013  . Chronic constipation 12/17/2012  . 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  . Unspecified essential hypertension 08/07/2012  . Unspecified constipation 08/07/2012  . Bipolar disorder, unspecified ([place]) 08/07/2012  . Attention deficit hyperactivity disorder 08/07/2012  . Panic disorder 08/07/2012  . Rectal bleeding 08/07/2012  . Abdominal pain, right upper quadrant 08/07/2012    Past Surgical History:  Procedure Laterality Date  . BACK SURGERY    . bunion removal    . BUNIONECTOMY Left    pins in big toe and 2nd toe  . CHOLECYSTECTOMY  10 yrs ago  . CHOLECYSTECTOMY  10+yrs ago  . COLONOSCOPY N/A 08/22/2012   Procedure: COLONOSCOPY;  Surgeon: Rogene Houston, MD;  Location: [facility];  Service:  Endoscopy;  Laterality: N/A;  100  . COLONOSCOPY    . COLONOSCOPY WITH PROPOFOL N/A 09/25/2015   Procedure: COLONOSCOPY WITH PROPOFOL;  Surgeon: Rogene Houston, MD;  Location: [facility];  Service: Endoscopy;  Laterality: N/A;  2:30-moved to 7:30 Ann notified pt  . epidural injections     x 2  . LUMBAR LAMINECTOMY/DECOMPRESSION MICRODISCECTOMY Left 07/03/2013   Procedure: LEFT LUMBAR THREE-FOUR microdiskectomy;  Surgeon: Winfield Cunas, MD;  Location: [facility];  Service: Neurosurgery;  Laterality: Left;  LEFT LUMBAR THREE-FOUR microdiskectomy  . LUMBAR LAMINECTOMY/DECOMPRESSION MICRODISCECTOMY Left 11/29/2013   Procedure: LEFT Lumbar Four-Five Redo microdiskectomy;  Surgeon: Winfield Cunas, MD;  Location: [facility];  Service: Neurosurgery;  Laterality: Left;  LEFT Lumbar Four-Five Redo microdiskectomy  . POLYPECTOMY  09/25/2015   Procedure: POLYPECTOMY;  Surgeon: Rogene Houston, MD;  Location: [facility];  Service: Endoscopy;;  at cecum  . SPINAL CORD STIMULATOR INSERTION N/A 06/13/2014   Procedure: LUMBAR SPINAL CORD STIMULATOR INSERTION;  Surgeon: Ashok Pall, MD;  Location: [facility];  Service: Neurosurgery;  Laterality: N/A;  permanent spinal cord stimulator insertion  . SPINAL CORD STIMULATOR REMOVAL N/A 12/17/2014   Procedure: THORACIC SPINAL CORD STIMULATOR REMOVAL;  Surgeon: Ashok Pall, MD;  Location: [place] [facility];  Service: Neurosurgery;  Laterality: N/A;  THORACIC SPINAL CORD STIMULATOR REMOVAL  . SPINAL CORD STIMULATOR TRIAL N/A 04/23/2014   Procedure: LUMBAR SPINAL CORD STIMULATOR TRIAL;  Surgeon: Ashok Pall, MD;  Location: [facility];  Service: Neurosurgery;  Laterality: N/A;  Spinal Cord Stimulator Trial  . TONSILLECTOMY    . TONSILLECTOMY     as a child  . wisdom teeth extracted    . WISDOM TOOTH EXTRACTION      OB History    Gravida Para Term Preterm AB Living   0             SAB TAB Ectopic Multiple Live Births                   Home Medications      Prior to Admission medications   Medication Sig Start Date End Date Taking? Authorizing Provider  albuterol (PROVENTIL HFA;VENTOLIN HFA) 108 (90 BASE) MCG/ACT inhaler Inhale 1-2 puffs into the lungs every 6 (six) hours as needed for wheezing. 04/23/15   Roselyn Malachy Moan, MD  clotrimazole-betamethasone (LOTRISONE) cream Apply topically 2 (two) times daily. 12/07/15   Derrill Center, NP  cyclobenzaprine (FLEXERIL) 10 MG tablet Take 1 tablet (10 mg total) by mouth 3 (three) times daily. 01/23/16   Lily Kocher, PA-C  dexamethasone (DECADRON) 4 MG tablet Take 1 tablet (4 mg total) by mouth 2 (two) times daily with a meal. 01/23/16  Lily Kocher, PA-C  diclofenac (VOLTAREN) 75 MG EC tablet Take 1 tablet (75 mg total) by mouth 2 (two) times daily. Patient not taking: Reported on 02/03/2016 01/23/16   Lily Kocher, PA-C  doxepin (SINEQUAN) 50 MG capsule Take 1 capsule (50 mg total) by mouth at bedtime. Patient taking differently: Take 50 mg by mouth 2 (two) times daily.  12/07/15   Derrill Center, NP  gemfibrozil (LOPID) 600 MG tablet Take 0.5 tablets (300 mg total) by mouth 2 (two) times daily before a meal. 12/07/15   Derrill Center, NP  lamoTRIgine (LAMICTAL) 25 MG tablet Take 1 tablet (25 mg total) by mouth daily. Patient taking differently: Take 100 mg by mouth daily.  12/07/15   Derrill Center, NP  lubiprostone (AMITIZA) 24 MCG capsule Take 1 capsule (24 mcg total) by mouth daily with breakfast. Patient taking differently: Take 24 mcg by mouth 2 (two) times daily with a meal.  03/31/15   Rogene Houston, MD  medroxyPROGESTERone (DEPO-PROVERA) 150 MG/ML injection INJECT 1 VIAL INTRAMUSCULARLY EVERY 3 MONTHS IN OFFICE. 08/04/15   Estill Dooms, NP  Olopatadine HCl (PATADAY) 0.2 % SOLN Place 1 drop into both eyes daily as needed (itchy eyes).    Historical Provider, MD  omeprazole (PRILOSEC) 20 MG capsule Take 20 mg by mouth daily.    Historical Provider, MD  tiZANidine (ZANAFLEX) 4 MG tablet take 1  tablet by mouth every 8 hours to every 12 hours if needed for muscle spasm 02/01/16   Historical Provider, MD  traMADol (ULTRAM) 50 MG tablet Take 1 tablet (50 mg total) by mouth every 6 (six) hours as needed. 01/23/16   Lily Kocher, PA-C    Family History Family History  Problem Relation Age of Onset  . Diabetes Mother   . Thyroid disease Mother   . Other Mother     PTSD  . Hyperlipidemia Mother   . Hypertension Maternal Aunt   . Diabetes Maternal Aunt   . Thyroid disease Maternal Aunt   . Diabetes Maternal Grandmother   . Heart disease Maternal Grandmother     CHF  . Hypertension Maternal Grandmother   . Dementia Maternal Grandmother   . Diabetes Father   . Hypertension Father   . Obesity Father   . Cancer Paternal Grandmother     breast  . Alcohol abuse Paternal Grandmother   . Crohn's disease Maternal Uncle   . Hyperlipidemia Maternal Uncle   . Other Maternal Uncle     back pain  . Dementia Maternal Uncle   . Diabetes Cousin   . Other Maternal Grandfather     lung transplant    Social History Social History  Substance Use Topics  . Smoking status: Current Every Day Smoker    Packs/day: 2.00    Years: 9.00    Types: Cigarettes  . Smokeless tobacco: Never Used     Comment: 1-2 cig per week  . Alcohol use No     Comment: occasionally- once per month      Allergies   Bee venom; Dilaudid [hydromorphone hcl]; Latex; Senna; and Adhesive [tape]   Review of Systems Review of Systems  Constitutional: Negative for fever.  Respiratory: Negative for shortness of breath.   Gastrointestinal: Negative for abdominal pain, constipation and vomiting.  Genitourinary: Negative for decreased urine volume, difficulty urinating, dysuria, flank pain and hematuria.  Musculoskeletal: Positive for back pain. Negative for joint swelling.  Skin: Negative for rash.  Neurological: Negative for weakness and  numbness.  All other systems reviewed and are negative.    Physical  Exam Updated Vital Signs BP 125/91 (BP Location: Right Arm)   Pulse 93   Temp 98.4 F (36.9 C)   Resp 16   Ht 5\' 6"  (1.676 m)   Wt 95.3 kg   SpO2 100%   BMI 33.89 kg/m   Physical Exam  Constitutional: She is oriented to person, place, and time. She appears well-developed and well-nourished. No distress.  HENT:  Head: Normocephalic and atraumatic.  Mouth/Throat: Oropharynx is clear and moist.  Neck: Normal range of motion. Neck supple.  Cardiovascular: Normal rate, regular rhythm and intact distal pulses.   No murmur heard. Pulmonary/Chest: Effort normal and breath sounds normal. No respiratory distress.  Abdominal: Soft. She exhibits no distension and no mass. There is no tenderness. There is no guarding.  Musculoskeletal: She exhibits tenderness. She exhibits no edema.       Lumbar back: She exhibits tenderness and pain. She exhibits normal range of motion, no swelling, no deformity, no laceration and normal pulse.  ttp of the lower lumbar spine and left paraspinal muscles and SI joint.  DP pulses are brisk and symmetrical.  Distal sensation intact.  Pt has equal strength against resistance of bilateral lower extremities.     Neurological: She is alert and oriented to person, place, and time. She has normal strength. No sensory deficit. She exhibits normal muscle tone. Coordination and gait normal.  Reflex Scores:      Patellar reflexes are 2+ on the right side and 2+ on the left side.      Achilles reflexes are 2+ on the right side and 2+ on the left side. Skin: Skin is warm and dry. No rash noted.  Nursing note and vitals reviewed.    ED Treatments / Results  Labs (all labs ordered are listed, but only abnormal results are displayed) Labs Reviewed - No data to display  EKG  EKG Interpretation None       Radiology No results found.  Procedures Procedures (including critical care time)  Medications Ordered in ED Medications  morphine 4 MG/ML injection 6 mg (6  mg Intramuscular Given 02/27/16 0940)  dexamethasone (DECADRON) injection 10 mg (10 mg Intramuscular Given 02/27/16 0940)  methocarbamol (ROBAXIN) tablet 500 mg (500 mg Oral Given 02/27/16 0940)     Initial Impression / Assessment and Plan / ED Course  I have reviewed the triage vital signs and the nursing notes.  Pertinent labs & imaging results that were available during my care of the patient were reviewed by me and considered in my medical decision making (see chart for details).  Clinical Course   Patient had a lumbar MRI on 02/05/2016 that shows disc protrusion with L4 nerve root impingement that is likely the source of the patient's pain. She has been evaluated by orthopedic surgery for this. No concerning symptoms for emergent neurological process. She does not currently have insurance, she is awaiting for approval for disability and for Medicaid. Other treatments were discussed with her by her orthopedist, the patient prefers to have surgical intervention.  Patient also seen by Dr. Lacinda Axon in care plan discussed.  1025  I was notified by the patient that she needs to go and she is requesting to be discharged.  She is feeling better, pain improved. Vital signs remained stable. She appears stable for discharge at this time. Ambulates in the dept with a steady gait.    Final Clinical Impressions(s) / ED  Diagnoses   Final diagnoses:  Lumbar radicular pain    New Prescriptions New Prescriptions   No medications on file     Bufford Lope 02/27/16 [place], MD 02/28/16 [phone]

## 2016-02-27 NOTE — ED Triage Notes (Signed)
Pt c/o continued lle pain/numbness/weakness since being seen on 01/23/16 and dx with sciatica. Pt states she had an MRI at [facility] on 02/05/16 and was told she needs back surgery.

## 2016-03-15 ENCOUNTER — Other Ambulatory Visit: Payer: Self-pay | Admitting: Women's Health

## 2016-03-22 ENCOUNTER — Other Ambulatory Visit (INDEPENDENT_AMBULATORY_CARE_PROVIDER_SITE_OTHER): Payer: Self-pay | Admitting: Internal Medicine

## 2016-03-29 ENCOUNTER — Other Ambulatory Visit: Payer: Self-pay | Admitting: Orthopedic Surgery

## 2016-03-30 ENCOUNTER — Ambulatory Visit (HOSPITAL_COMMUNITY)
Admission: RE | Admit: 2016-03-30 | Discharge: 2016-03-30 | Disposition: A | Discharge status: Home / Self Care | Payer: Medicaid Other | Source: Ambulatory Visit | Attending: Orthopedic Surgery | Admitting: Orthopedic Surgery

## 2016-03-30 ENCOUNTER — Other Ambulatory Visit: Payer: Self-pay | Admitting: Orthopedic Surgery

## 2016-03-30 ENCOUNTER — Encounter (HOSPITAL_COMMUNITY)
Admission: RE | Admit: 2016-03-30 | Discharge: 2016-03-30 | Disposition: A | Discharge status: Home / Self Care | Payer: Medicaid Other | Source: Ambulatory Visit | Attending: Orthopedic Surgery | Admitting: Orthopedic Surgery

## 2016-03-30 ENCOUNTER — Encounter (HOSPITAL_COMMUNITY): Payer: Self-pay

## 2016-03-30 ENCOUNTER — Other Ambulatory Visit (HOSPITAL_COMMUNITY): Payer: Self-pay | Admitting: *Deleted

## 2016-03-30 DIAGNOSIS — Z01818 Encounter for other preprocedural examination: Secondary | ICD-10-CM | POA: Diagnosis not present

## 2016-03-30 DIAGNOSIS — M5136 Other intervertebral disc degeneration, lumbar region: Secondary | ICD-10-CM | POA: Diagnosis not present

## 2016-03-30 DIAGNOSIS — Z01812 Encounter for preprocedural laboratory examination: Secondary | ICD-10-CM | POA: Insufficient documentation

## 2016-03-30 DIAGNOSIS — M5410 Radiculopathy, site unspecified: Secondary | ICD-10-CM | POA: Insufficient documentation

## 2016-03-30 DIAGNOSIS — Z0183 Encounter for blood typing: Secondary | ICD-10-CM | POA: Insufficient documentation

## 2016-03-30 LAB — COMPREHENSIVE METABOLIC PANEL
ALT: 15 U/L (ref 14–54)
AST: 19 U/L (ref 15–41)
Albumin: 3.7 g/dL (ref 3.5–5.0)
Alkaline Phosphatase: 44 U/L (ref 38–126)
Anion gap: 8 (ref 5–15)
CHLORIDE: 105 mmol/L (ref 101–111)
CO2: 24 mmol/L (ref 22–32)
CREATININE: 0.91 mg/dL (ref 0.44–1.00)
Calcium: 9.5 mg/dL (ref 8.9–10.3)
GFR calc Af Amer: >60 mL/min (ref >60)
Glucose, Bld: 93 mg/dL (ref 65–99)
Potassium: 4.2 mmol/L (ref 3.5–5.1)
Sodium: 137 mmol/L (ref 135–145)
Total Bilirubin: 0.3 mg/dL (ref 0.3–1.2)
Total Protein: 6.5 g/dL (ref 6.5–8.1)

## 2016-03-30 LAB — CBC WITH DIFFERENTIAL/PLATELET
BASOS ABS: 0 10*3/uL (ref 0.0–0.1)
Basophils Relative: 0 %
EOS PCT: 5 %
Eosinophils Absolute: 0.5 10*3/uL (ref 0.0–0.7)
HEMATOCRIT: 39.7 % (ref 36.0–46.0)
HEMOGLOBIN: 13.9 g/dL (ref 12.0–15.0)
LYMPHS PCT: 38 %
Lymphs Abs: 3.9 10*3/uL (ref 0.7–4.0)
MCH: 32 pg (ref 26.0–34.0)
MCHC: 35 g/dL (ref 30.0–36.0)
MCV: 91.3 fL (ref 78.0–100.0)
Monocytes Absolute: 0.6 10*3/uL (ref 0.1–1.0)
Monocytes Relative: 6 %
NEUTROS ABS: 5.2 10*3/uL (ref 1.7–7.7)
NEUTROS PCT: 51 %
PLATELETS: 373 10*3/uL (ref 150–400)
RBC: 4.35 MIL/uL (ref 3.87–5.11)
RDW: 13.3 % (ref 11.5–15.5)
WBC: 10.2 10*3/uL (ref 4.0–10.5)

## 2016-03-30 LAB — TYPE AND SCREEN
ABO/RH(D): O POS
Antibody Screen: NEGATIVE

## 2016-03-30 LAB — HCG, SERUM, QUALITATIVE: Preg, Serum: NEGATIVE

## 2016-03-30 LAB — PROTIME-INR
INR: 0.92
PROTHROMBIN TIME: 12.3 s (ref 11.4–15.2)

## 2016-03-30 LAB — ABO/RH: ABO/RH(D): O POS

## 2016-03-30 LAB — SURGICAL PCR SCREEN
MRSA, PCR: NEGATIVE
Staphylococcus aureus: NEGATIVE

## 2016-03-30 LAB — APTT: APTT: 29 s (ref 24–36)

## 2016-03-30 NOTE — Progress Notes (Signed)
Pt denies cardiac history, chest pain or sob. Pt has recently started Chantix for quitting smoking, making her extremely sleepy. Pt kept dozing off during PAT appt. Gave her coffee to drink, pt states that will help her feel more alert.

## 2016-03-30 NOTE — Pre-Procedure Instructions (Addendum)
Sharon Vazquez  03/30/2016    Your procedure is scheduled on Wednesday, April 06, 2016 at 12:30 PM.   Report to [facility] Entrance "A" Admitting Office at 9:30 AM.   Call this number if you have problems the morning of surgery: [phone]   Questions prior to day of surgery, please call [phone] between 8 & 4 PM.   Remember:  Do not eat food or drink liquids after midnight Tuesday, 04/05/16.  Take these medicines the morning of surgery with A SIP OF WATER: Cyclobenzaprine (Flexeril), Doxepin (Sinequan), Lamotrigine (Lamictal), Propanolol, Clonidine, Cymbalta, Xanax, QVAR inhaler, eye drops - if needed, Tylenol #3 - if needed, Nasonex - if needed,  Albuterol inhaler - if needed (bring this inhaler with you day of surgery)  Stop NSAIDS (Diclofenac, Voltaren, Aleve, Ibuprofen, etc.) as of today. Do not use any Aspirin products prior to surgery.  DO NOT SMOKE 24 hours prior to surgery.   Do not wear jewelry, make-up or nail polish.  Do not wear lotions, powders, or perfumes.  Do not shave 48 hours prior to surgery.    Do not bring valuables to the hospital.  [facility] is not responsible for any belongings or valuables.  Contacts, dentures or bridgework may not be worn into surgery.  Leave your suitcase in the car.  After surgery it may be brought to your room.  For patients admitted to the hospital, discharge time will be determined by your treatment team.  Special instructions:  [facility] - Preparing for Surgery  Before surgery, you can play an important role.  Because skin is not sterile, your skin needs to be as free of germs as possible.  You can reduce the number of germs on you skin by washing with CHG (chlorahexidine gluconate) soap before surgery.  CHG is an antiseptic cleaner which kills germs and bonds with the skin to continue killing germs even after washing.  Please DO NOT use if you have an allergy to CHG or antibacterial soaps.  If your skin  becomes reddened/irritated stop using the CHG and inform your nurse when you arrive at Short Stay.  Do not shave (including legs and underarms) for at least 48 hours prior to the first CHG shower.  You may shave your face.  Please follow these instructions carefully:   1.  Shower with CHG Soap the night before surgery and the                    morning of Surgery.  2.  If you choose to wash your hair, wash your hair first as usual with your       normal shampoo.  3.  After you shampoo, rinse your hair and body thoroughly to remove the shampoo.  4.  Use CHG as you would any other liquid soap.  You can apply chg directly       to the skin and wash gently with scrungie or a clean washcloth.  5.  Apply the CHG Soap to your body ONLY FROM THE NECK DOWN.        Do not use on open wounds or open sores.  Avoid contact with your eyes, ears, mouth and genitals (private parts).  Wash genitals (private parts) with your normal soap.  6.  Wash thoroughly, paying special attention to the area where your surgery        will be performed.  7.  Thoroughly rinse your body with warm water from  the neck down.  8.  DO NOT shower/wash with your normal soap after using and rinsing off       the CHG Soap.  9.  Pat yourself dry with a clean towel.            10.  Wear clean pajamas.            11.  Place clean sheets on your bed the night of your first shower and do not        sleep with pets.  Day of Surgery  Do not apply any lotions the morning of surgery.  Please wear clean clothes to the hospital.   Please read over the following fact sheets that you were given. Pain Booklet, Coughing and Deep Breathing, MRSA Information and Surgical Site Infection Prevention

## 2016-04-01 IMAGING — US US ABDOMEN COMPLETE
1 series · 14 of 25 positions shown · non-contrast
Comparison: None.

CLINICAL DATA: Abdominal pain. Initial encounter. Generalized
abdominal pain. Symptoms for 2 weeks. Cholecystectomy.

EXAM:
ULTRASOUND ABDOMEN COMPLETE

[Series 1: us abdomen complete · 0.25mm/px · 14 of 70 slices shown]
[im 1/70]
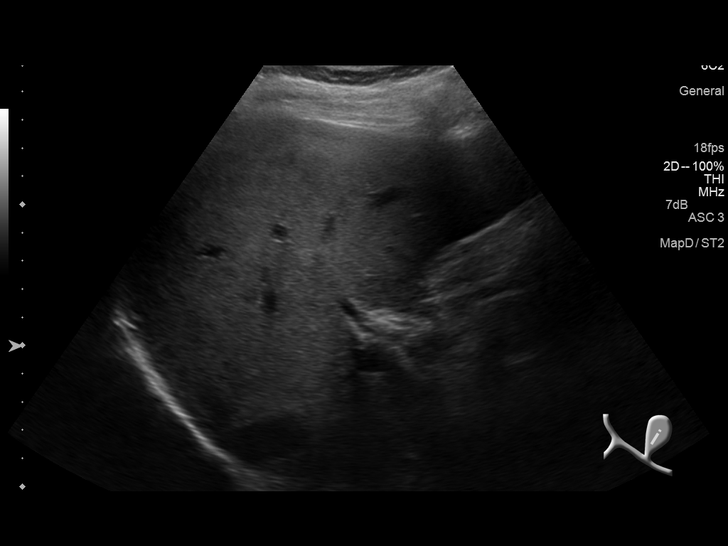
[im 6/70]
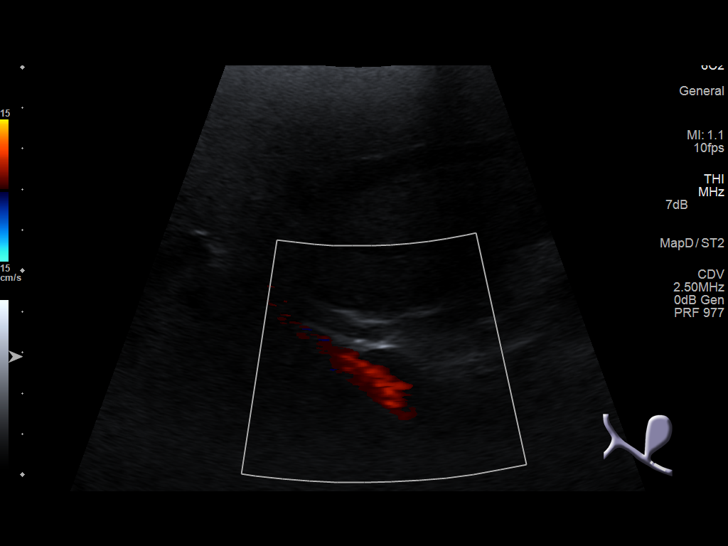
[im 12/70]
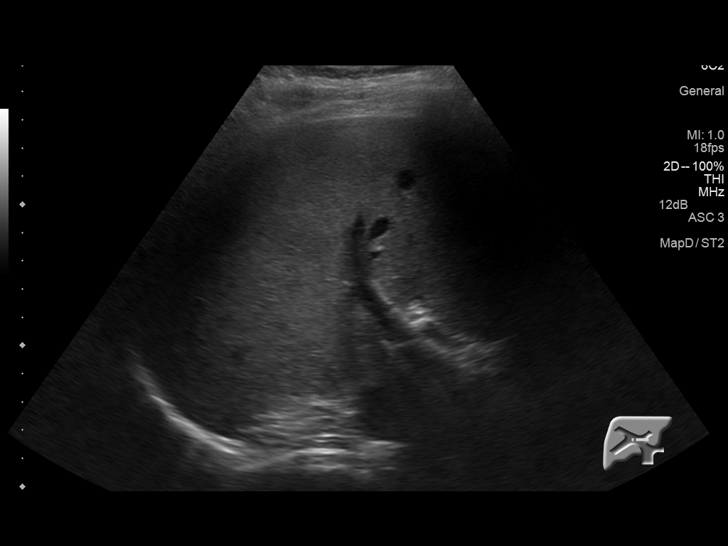
[im 18/70]
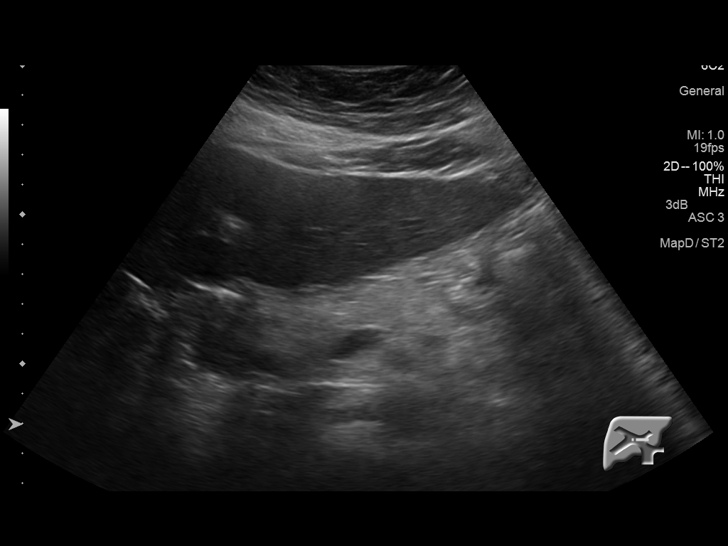
[im 24/70]
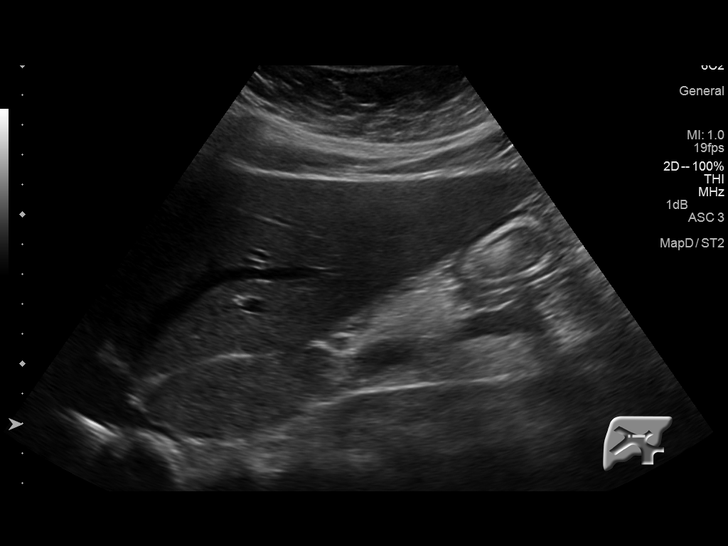
[im 26/70]
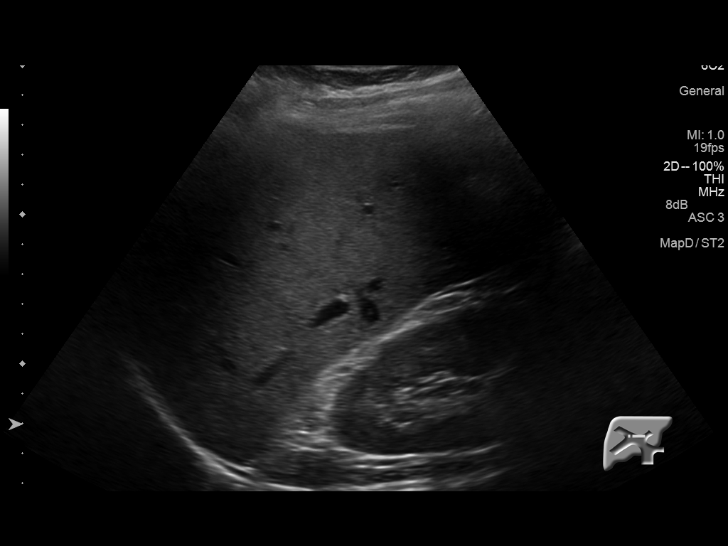
[im 32/70]
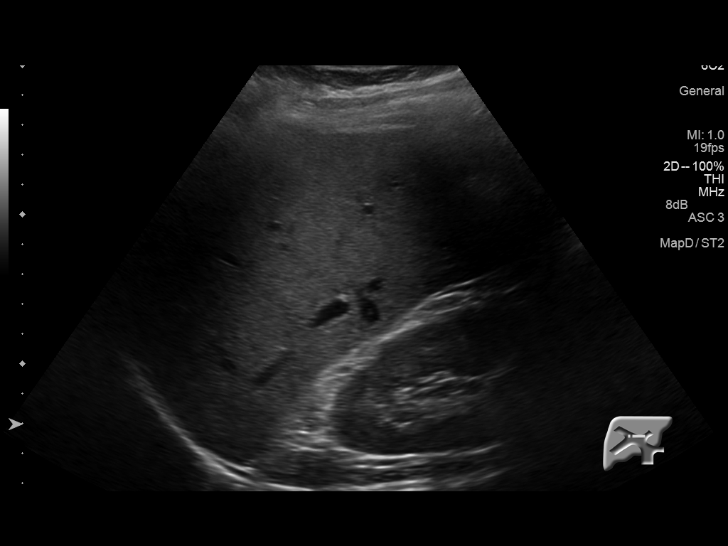
[im 38/70]
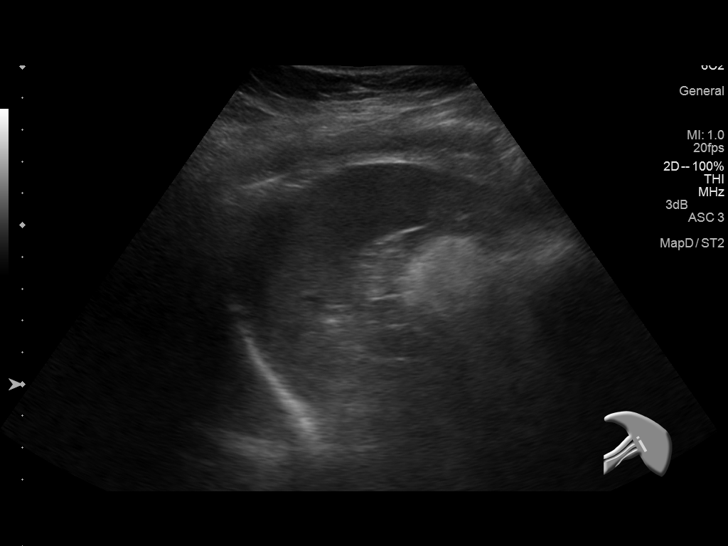
[im 44/70]
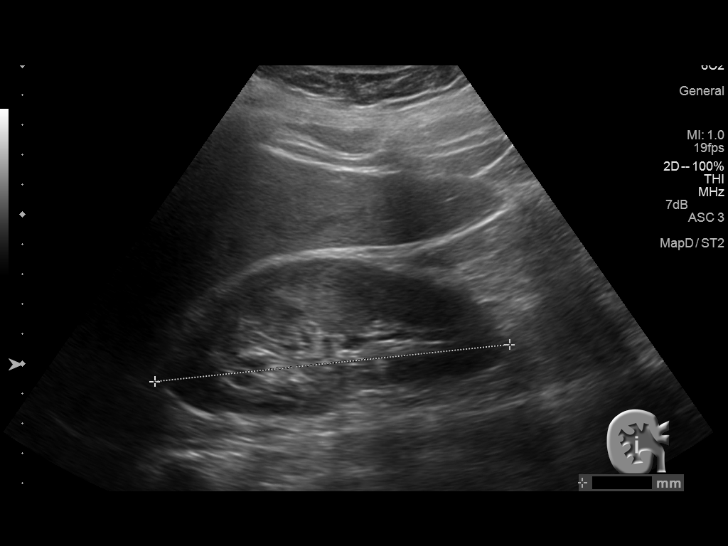
[im 47/70]
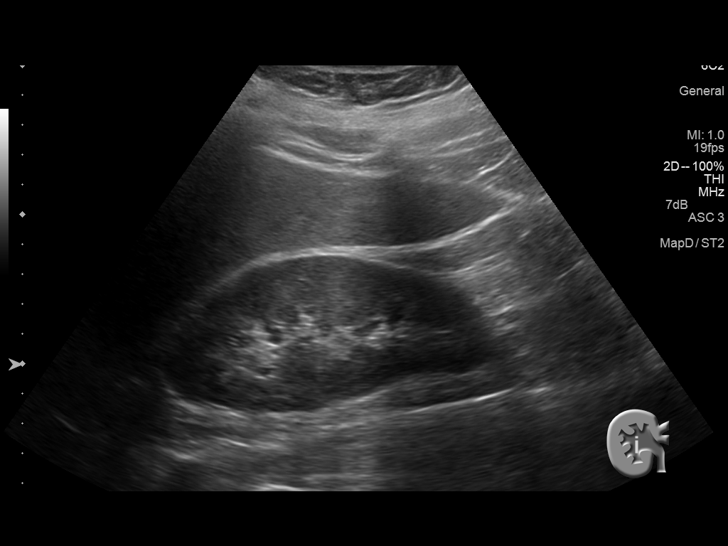
[im 52/70]
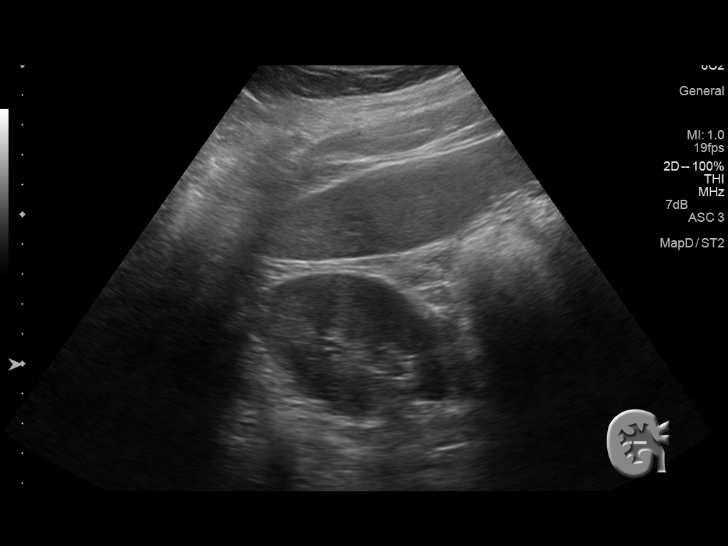
[im 58/70]
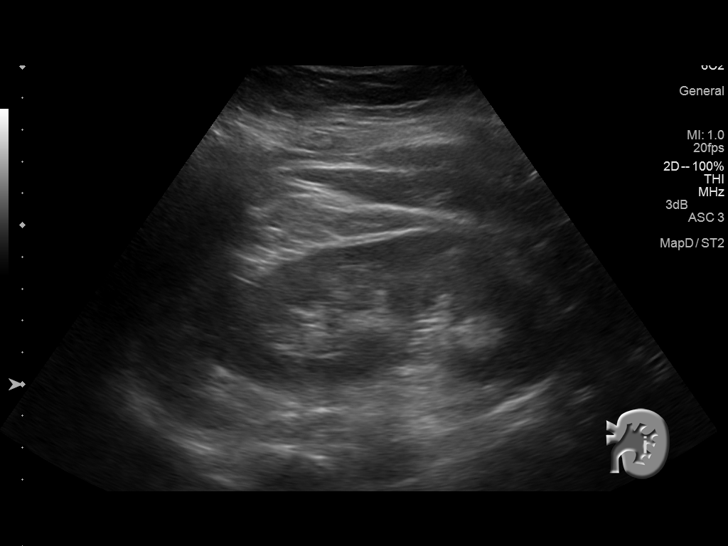
[im 64/70]
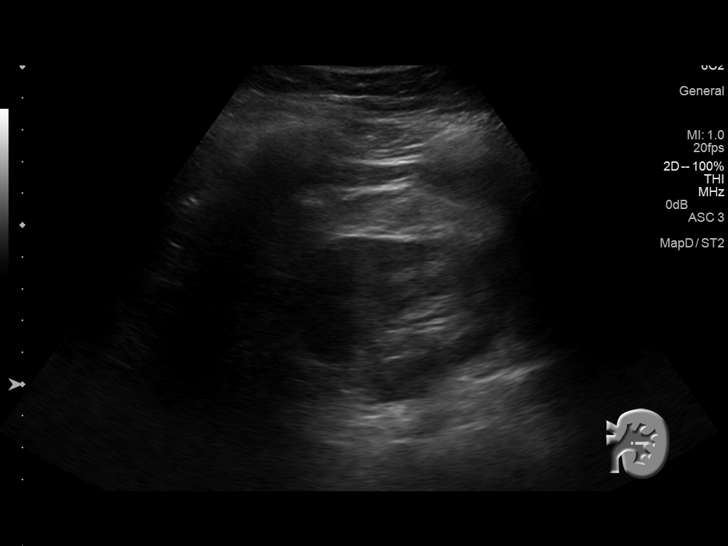
[im 70/70]
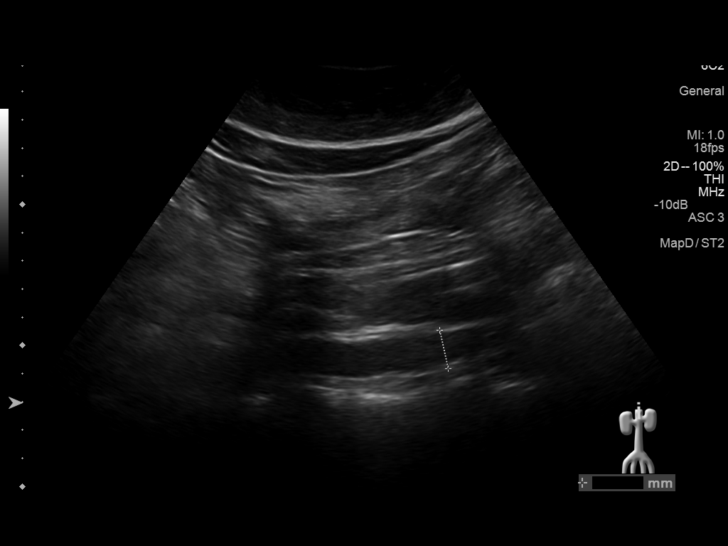

[14 of 25 positions shown; findings below may reference images not displayed]

FINDINGS: Gallbladder: Surgically absent.

Common bile duct: Diameter: 6 mm common normal status post
cholecystectomy.

Liver: Isoechoic when compared to the adjacent RIGHT kidney. Normal
echotexture. No mass lesion. No intrahepatic biliary ductal
dilation.

IVC: No abnormality visualized.

Pancreas: Visualized portion unremarkable.

Spleen: Size and appearance within normal limits.

Right Kidney: Length: 11.9 cm. Echogenicity within normal limits. No
mass or hydronephrosis visualized.

Left Kidney: Length: 11.9 cm. Echogenicity within normal limits. No
mass or hydronephrosis visualized.

Abdominal aorta: No aneurysm visualized.

Other findings: None.
IMPRESSION: Normal abdominal ultrasound following cholecystectomy.

## 2016-04-05 NOTE — H&P (Signed)
PREOPERATIVE H&P  Chief Complaint: Left leg pain  HPI: Sharon Vazquez is a 35 y.o. female who presents with ongoing pain in the left leg. Patient is s/p a previous L3/4 decompression by another provider.   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  Patient has failed multiple forms of conservative care and continues to have pain (see office notes for additional details regarding the patient's full course of treatment)  Past Medical History:  Diagnosis Date  . Abnormal Pap smear   . ADHD (attention deficit hyperactivity disorder)   . Anxiety   . Anxiety    takes Xanax daily as needed  . Arthritis    degenerative spine  . Asthma    Ventolin as needed and QVAR takes daily  . Back spasm    takes Flexeril daily as needed  . Bipolar 1 disorder ([place])   . Bipolar 1 disorder ([facility])    takes DOxepin daily  . Borderline personality disorder   . Chronic back pain    HNP  . Constipation    takes Dulcolax daily as needed takes Amitiza daily  . Contraceptive management 08/23/2013  . Eczema    has 2 creams uses as needed  . GERD (gastroesophageal reflux disease)    takes Tums as needed  . Headache(784.0)   . Headache(784.0)    migraines-last one about 62months ago;takes Topamax daily  . History of bronchitis    last time 4-27yrs ago  . History of colon polyps   . HSV-2 (herpes simplex virus 2) infection   . Hx of chlamydia infection   . Hypertension   . Hypertension    takes INderal and Clonidine daily  . IBS (irritable bowel syndrome)   . Insomnia    takes Ambien nightly  . Internal hemorrhoids   . Irregular periods 04/02/2014  . Joint swelling    right knee  . Mental disorder    takes Abilify as needed  . Obesity   . Panic attack   . Panic disorder   . Sciatica   . Shortness of breath    with exertion  . Urinary urgency   . Weakness    numbness and tingling to left foot   Past Surgical History:  Procedure Laterality Date  .  BACK SURGERY    . bunion removal    . BUNIONECTOMY Left    pins in big toe and 2nd toe  . CHOLECYSTECTOMY  10 yrs ago  . CHOLECYSTECTOMY  10+yrs ago  . COLONOSCOPY N/A 08/22/2012   Procedure: COLONOSCOPY;  Surgeon: Rogene Houston, MD;  [facility];  Service: Endoscopy;  Laterality: N/A;  100  . COLONOSCOPY    . COLONOSCOPY WITH PROPOFOL N/A 09/25/2015   Procedure: COLONOSCOPY WITH PROPOFOL;  Surgeon: Rogene Houston, MD;  [facility];  Service: Endoscopy;  Laterality: N/A;  2:30-moved to 7:30 Ann notified pt  . epidural injections     x 2  . LUMBAR LAMINECTOMY/DECOMPRESSION MICRODISCECTOMY Left 07/03/2013   Procedure: LEFT LUMBAR THREE-FOUR microdiskectomy;  Surgeon: Winfield Cunas, MD;  Location: [place] [facility];  Service: Neurosurgery;  Laterality: Left;  LEFT LUMBAR THREE-FOUR microdiskectomy  . LUMBAR LAMINECTOMY/DECOMPRESSION MICRODISCECTOMY Left 11/29/2013   Procedure: LEFT Lumbar Four-Five Redo microdiskectomy;  Surgeon: Winfield Cunas, MD;  Location: [place] [facility];  Service: Neurosurgery;  Laterality: Left;  LEFT Lumbar Four-Five Redo microdiskectomy  . POLYPECTOMY  09/25/2015   Procedure: POLYPECTOMY;  Surgeon:  Rogene Houston, MD;  [facility];  Service: Endoscopy;;  at cecum  . SPINAL CORD STIMULATOR INSERTION N/A 06/13/2014   Procedure: LUMBAR SPINAL CORD STIMULATOR INSERTION;  Surgeon: Ashok Pall, MD;  Location: [place] [facility];  Service: Neurosurgery;  Laterality: N/A;  permanent spinal cord stimulator insertion  . SPINAL CORD STIMULATOR REMOVAL N/A 12/17/2014   Procedure: THORACIC SPINAL CORD STIMULATOR REMOVAL;  Surgeon: Ashok Pall, MD;  Location: [place] [facility];  Service: Neurosurgery;  Laterality: N/A;  THORACIC SPINAL CORD STIMULATOR REMOVAL  . SPINAL CORD STIMULATOR TRIAL N/A 04/23/2014   Procedure: LUMBAR SPINAL CORD STIMULATOR TRIAL;  Surgeon: Ashok Pall, MD;  Location: [place] [facility];  Service: Neurosurgery;  Laterality: N/A;  Spinal Cord  Stimulator Trial  . TONSILLECTOMY    . TONSILLECTOMY     as a child  . wisdom teeth extracted    . WISDOM TOOTH EXTRACTION     Social History   Social History  . Marital status: Legally Separated    Spouse name: N/A  . Number of children: N/A  . Years of education: N/A   Social History Main Topics  . Smoking status: Current Every Day Smoker    Packs/day: 2.00    Years: 9.00    Types: Cigarettes  . Smokeless tobacco: Never Used     Comment: Started Chantix 03/29/16  . Alcohol use No  . Drug use: No     Comment: none since 2015  . Sexual activity: Yes    Birth control/ protection: Injection   Other Topics Concern  . Not on file   Social History Narrative   ** Merged History Encounter **       ** Merged History Encounter **       Family History  Problem Relation Age of Onset  . Diabetes Mother   . Thyroid disease Mother   . Other Mother     PTSD  . Hyperlipidemia Mother   . Hypertension Maternal Aunt   . Diabetes Maternal Aunt   . Thyroid disease Maternal Aunt   . Diabetes Maternal Grandmother   . Heart disease Maternal Grandmother     CHF  . Hypertension Maternal Grandmother   . Dementia Maternal Grandmother   . Diabetes Father   . Hypertension Father   . Obesity Father   . Cancer Paternal Grandmother     breast  . Alcohol abuse Paternal Grandmother   . Crohn's disease Maternal Uncle   . Hyperlipidemia Maternal Uncle   . Other Maternal Uncle     back pain  . Dementia Maternal Uncle   . Diabetes Cousin   . Other Maternal Grandfather     lung transplant   Allergies  Allergen Reactions  . Bee Venom Anaphylaxis  . Dilaudid [Hydromorphone Hcl] Anaphylaxis and Hives    Has tolerated morphine since this reaction  . Latex Hives  . Senna Anaphylaxis and Hives  . Adhesive [Tape] Hives and Other (See Comments)    Pulls skin off (use paper tape)   Prior to Admission medications   Medication Sig Start Date End Date Taking? Authorizing Provider    acetaminophen-codeine (TYLENOL #3) 300-30 MG tablet take 1-2 tablets by mouth every 6 to 8 hours if needed for pain 03/02/16  Yes Historical Provider, MD  albuterol (PROVENTIL HFA;VENTOLIN HFA) 108 (90 BASE) MCG/ACT inhaler Inhale 1-2 puffs into the lungs every 6 (six) hours as needed for wheezing. 04/23/15  Yes Roselyn Malachy Moan, MD  ALPRAZolam (XANAX XR) 3 MG  24 hr tablet Take 3 mg by mouth 2 (two) times daily.   Yes Historical Provider, MD  beclomethasone (QVAR) 80 MCG/ACT inhaler Inhale 2 puffs into the lungs 2 (two) times daily.   Yes Historical Provider, MD  cloNIDine (CATAPRES) 0.1 MG tablet Take 0.1 mg by mouth 2 (two) times daily.   Yes Historical Provider, MD  clotrimazole-betamethasone (LOTRISONE) cream Apply topically 2 (two) times daily. 12/07/15  Yes Derrill Center, NP  cyclobenzaprine (FLEXERIL) 10 MG tablet Take 1 tablet (10 mg total) by mouth 3 (three) times daily. 01/23/16  Yes Lily Kocher, PA-C  diclofenac (VOLTAREN) 75 MG EC tablet Take 1 tablet (75 mg total) by mouth 2 (two) times daily. Take with food 02/27/16  Yes Tammy Triplett, PA-C  Diclofenac Potassium (CAMBIA) 50 MG PACK Take 50 mg by mouth daily as needed (migraine).   Yes Historical Provider, MD  doxepin (SINEQUAN) 50 MG capsule Take 1 capsule (50 mg total) by mouth at bedtime. Patient taking differently: Take 50 mg by mouth 2 (two) times daily.  12/07/15  Yes Derrill Center, NP  DULoxetine (CYMBALTA) 60 MG capsule Take 60 mg by mouth 2 (two) times daily.   Yes Historical Provider, MD  EPINEPHrine 0.3 mg/0.3 mL IJ SOAJ injection Inject into the muscle once.   Yes Historical Provider, MD  esomeprazole (NEXIUM) 40 MG capsule TAKE 1 CAPSULE ONCE DAILY AT 12 NOON. 03/22/16  Yes Butch Penny, NP  gemfibrozil (LOPID) 600 MG tablet Take 0.5 tablets (300 mg total) by mouth 2 (two) times daily before a meal. Patient taking differently: Take 600 mg by mouth 2 (two) times daily before a meal.  12/07/15  Yes Derrill Center, NP   lamoTRIgine (LAMICTAL) 25 MG tablet Take 1 tablet (25 mg total) by mouth daily. Patient taking differently: Take 100 mg by mouth daily.  12/07/15  Yes Derrill Center, NP  lubiprostone (AMITIZA) 24 MCG capsule Take 1 capsule (24 mcg total) by mouth daily with breakfast. Patient taking differently: Take 24 mcg by mouth 2 (two) times daily with a meal.  03/31/15  Yes Rogene Houston, MD  medroxyPROGESTERone (DEPO-PROVERA) 150 MG/ML injection INJECT 1 VIAL INTRAMUSCULARLY EVERY 3 MONTHS IN OFFICE. 08/04/15  Yes Estill Dooms, NP  megestrol (MEGACE) 40 MG tablet TAKE 3 TABLETS DAILY FOR 5 DAYS, 2 TABLETS DAILY FOR 5 DAYS, THEN1 TABLET DAILY UNTIL BLEEDING STOPS. 03/17/16  Yes Roma Schanz, CNM  mometasone (NASONEX) 50 MCG/ACT nasal spray Place 2 sprays into the nose daily as needed.   Yes Historical Provider, MD  Olopatadine HCl (PATADAY) 0.2 % SOLN Place 1 drop into both eyes daily as needed (itchy eyes).   Yes Historical Provider, MD  pimecrolimus (ELIDEL) 1 % cream Apply topically 2 (two) times daily.   Yes Historical Provider, MD  propranolol (INDERAL) 10 MG tablet Take 10 mg by mouth 2 (two) times daily.   Yes Historical Provider, MD  triazolam (HALCION) 0.25 MG tablet Take 0.25 mg by mouth at bedtime as needed for sleep.   Yes Historical Provider, MD  Urea (ALUVEA) 39 % CREA Apply 1 application topically daily.   Yes Historical Provider, MD  dexamethasone (DECADRON) 4 MG tablet Take 1 tablet (4 mg total) by mouth 2 (two) times daily with a meal. 01/23/16   Lily Kocher, PA-C  HYDROcodone-acetaminophen (NORCO/VICODIN) 5-325 MG tablet Take 1 tablet by mouth every 12 (twelve) hours as needed for moderate pain.    Historical Provider, MD  tiZANidine (  ZANAFLEX) 4 MG tablet take 1 tablet by mouth every 8 hours to every 12 hours if needed for muscle spasm 02/01/16   Historical Provider, MD  traMADol (ULTRAM) 50 MG tablet Take 1 tablet (50 mg total) by mouth every 6 (six) hours as needed. Patient  not taking: Reported on 03/30/2016 01/23/16   Lily Kocher, PA-C  varenicline (CHANTIX) 0.5 MG tablet Take 0.5 mg by mouth 2 (two) times daily. Pt taking 1 a day for 3 days, then 2 times a day    Historical Provider, MD     All other systems have been reviewed and were otherwise negative with the exception of those mentioned in the HPI and as above.  Physical Exam: There were no vitals filed for this visit.  General: Alert, no acute distress Cardiovascular: No pedal edema Respiratory: No cyanosis, no use of accessory musculature Skin: No lesions in the area of chief complaint Neurologic: Sensation intact distally Psychiatric: Patient is competent for consent with normal mood and affect Lymphatic: No axillary or cervical lymphadenopathy  MUSCULOSKELETAL: + SLR on the left  Assessment/Plan:  Radiculopathy Plan for Procedure(s): LEFT SIDED LUMBAR 3-4 TRANSFORAMINAL LUMBAR INTERBODY FUSION WITH INSTRUMENATION AND ALLOGRAFT; LUMBAR 3-4 REVISION DECOMPRESSION   Sinclair Ship, MD 04/05/2016 8:44 AM

## 2016-04-06 ENCOUNTER — Inpatient Hospital Stay (HOSPITAL_COMMUNITY): Payer: Medicaid Other | Admitting: Certified Registered Nurse Anesthetist

## 2016-04-06 ENCOUNTER — Inpatient Hospital Stay (HOSPITAL_COMMUNITY): Payer: Medicaid Other

## 2016-04-06 ENCOUNTER — Encounter (HOSPITAL_COMMUNITY)
Admission: RE | Disposition: A | Discharge status: Home / Self Care | Payer: Self-pay | Source: Ambulatory Visit | Attending: Orthopedic Surgery

## 2016-04-06 ENCOUNTER — Encounter (HOSPITAL_COMMUNITY): Payer: Self-pay | Admitting: Anesthesiology

## 2016-04-06 ENCOUNTER — Inpatient Hospital Stay (HOSPITAL_COMMUNITY)
Admission: RE | Admit: 2016-04-06 | Discharge: 2016-04-07 | DRG: 460 | Disposition: A | Discharge status: Home / Self Care | Payer: Medicaid Other | Source: Ambulatory Visit | Attending: Orthopedic Surgery | Admitting: Orthopedic Surgery

## 2016-04-06 DIAGNOSIS — Z791 Long term (current) use of non-steroidal anti-inflammatories (NSAID): Secondary | ICD-10-CM

## 2016-04-06 DIAGNOSIS — K5900 Constipation, unspecified: Secondary | ICD-10-CM | POA: Diagnosis not present

## 2016-04-06 DIAGNOSIS — F319 Bipolar disorder, unspecified: Secondary | ICD-10-CM | POA: Diagnosis not present

## 2016-04-06 DIAGNOSIS — F17210 Nicotine dependence, cigarettes, uncomplicated: Secondary | ICD-10-CM | POA: Diagnosis present

## 2016-04-06 DIAGNOSIS — M5116 Intervertebral disc disorders with radiculopathy, lumbar region: Secondary | ICD-10-CM | POA: Diagnosis not present

## 2016-04-06 DIAGNOSIS — M5410 Radiculopathy, site unspecified: Secondary | ICD-10-CM | POA: Diagnosis present

## 2016-04-06 DIAGNOSIS — Z79899 Other long term (current) drug therapy: Secondary | ICD-10-CM

## 2016-04-06 DIAGNOSIS — J45909 Unspecified asthma, uncomplicated: Secondary | ICD-10-CM | POA: Diagnosis not present

## 2016-04-06 DIAGNOSIS — F419 Anxiety disorder, unspecified: Secondary | ICD-10-CM | POA: Diagnosis present

## 2016-04-06 DIAGNOSIS — Z419 Encounter for procedure for purposes other than remedying health state, unspecified: Secondary | ICD-10-CM

## 2016-04-06 DIAGNOSIS — G4700 Insomnia, unspecified: Secondary | ICD-10-CM | POA: Diagnosis present

## 2016-04-06 DIAGNOSIS — M79605 Pain in left leg: Secondary | ICD-10-CM | POA: Diagnosis present

## 2016-04-06 DIAGNOSIS — I10 Essential (primary) hypertension: Secondary | ICD-10-CM | POA: Diagnosis not present

## 2016-04-06 DIAGNOSIS — Z7951 Long term (current) use of inhaled steroids: Secondary | ICD-10-CM

## 2016-04-06 HISTORY — PX: LUMBAR FUSION: SHX111

## 2016-04-06 LAB — URINALYSIS, ROUTINE W REFLEX MICROSCOPIC
BILIRUBIN URINE: NEGATIVE
Glucose, UA: NEGATIVE mg/dL
Hgb urine dipstick: NEGATIVE
Ketones, ur: NEGATIVE mg/dL
Leukocytes, UA: NEGATIVE
NITRITE: NEGATIVE
Protein, ur: NEGATIVE mg/dL
SPECIFIC GRAVITY, URINE: 1.005 (ref 1.005–1.030)
pH: 5.5 (ref 5.0–8.0)

## 2016-04-06 SURGERY — POSTERIOR LUMBAR FUSION 1 LEVEL
Anesthesia: General | Laterality: Left

## 2016-04-06 MED ORDER — ALBUTEROL SULFATE (2.5 MG/3ML) 0.083% IN NEBU
2.5000 mg | INHALATION_SOLUTION | Freq: Four times a day (QID) | RESPIRATORY_TRACT | Status: DC | PRN
Start: 2016-04-06 — End: 2016-04-07

## 2016-04-06 MED ORDER — KETAMINE HCL-SODIUM CHLORIDE 100-0.9 MG/10ML-% IV SOSY
PREFILLED_SYRINGE | INTRAVENOUS | Status: AC
  Filled 2016-04-06: qty 10

## 2016-04-06 MED ORDER — OXYCODONE-ACETAMINOPHEN 5-325 MG PO TABS
ORAL_TABLET | ORAL | Status: AC
  Filled 2016-04-06: qty 1

## 2016-04-06 MED ORDER — DIAZEPAM 5 MG PO TABS
5.0000 mg | ORAL_TABLET | Freq: Four times a day (QID) | ORAL | Status: DC | PRN
  Administered 2016-04-07: 5 mg via ORAL
  Filled 2016-04-06: qty 1

## 2016-04-06 MED ORDER — ALPRAZOLAM 0.5 MG PO TABS
3.0000 mg | ORAL_TABLET | Freq: Two times a day (BID) | ORAL | Status: DC
  Filled 2016-04-06: qty 6

## 2016-04-06 MED ORDER — LIDOCAINE HCL (CARDIAC) 20 MG/ML IV SOLN
INTRAVENOUS | Status: DC | PRN
  Administered 2016-04-06: 60 mg via INTRATRACHEAL

## 2016-04-06 MED ORDER — GLYCOPYRROLATE 0.2 MG/ML IV SOSY
PREFILLED_SYRINGE | INTRAVENOUS | Status: AC
  Filled 2016-04-06: qty 3

## 2016-04-06 MED ORDER — ONDANSETRON HCL 4 MG/2ML IJ SOLN
INTRAMUSCULAR | Status: DC | PRN
  Administered 2016-04-06 (×2): 4 mg via INTRAVENOUS

## 2016-04-06 MED ORDER — SODIUM CHLORIDE 0.9 % IV SOLN: INTRAVENOUS | Status: DC

## 2016-04-06 MED ORDER — MEPERIDINE HCL 25 MG/ML IJ SOLN: 6.2500 mg | INTRAMUSCULAR | Status: DC | PRN

## 2016-04-06 MED ORDER — MORPHINE SULFATE (PF) 2 MG/ML IV SOLN
1.0000 mg | INTRAVENOUS | Status: DC | PRN
  Administered 2016-04-06: 2 mg via INTRAVENOUS
  Filled 2016-04-06: qty 1

## 2016-04-06 MED ORDER — METHYLENE BLUE 0.5 % INJ SOLN
INTRAVENOUS | Status: AC
  Filled 2016-04-06: qty 10

## 2016-04-06 MED ORDER — ONDANSETRON HCL 4 MG/2ML IJ SOLN: 4.0000 mg | INTRAMUSCULAR | Status: DC | PRN

## 2016-04-06 MED ORDER — CEFAZOLIN SODIUM-DEXTROSE 2-4 GM/100ML-% IV SOLN
2.0000 g | Freq: Three times a day (TID) | INTRAVENOUS | Status: AC
  Administered 2016-04-06 – 2016-04-07 (×2): 2 g via INTRAVENOUS
  Filled 2016-04-06 (×2): qty 100

## 2016-04-06 MED ORDER — SODIUM CHLORIDE 0.9 % IV SOLN: 250.0000 mL | INTRAVENOUS | Status: DC

## 2016-04-06 MED ORDER — PROPOFOL 500 MG/50ML IV EMUL
INTRAVENOUS | Status: DC | PRN
  Administered 2016-04-06: 75 ug/kg/min via INTRAVENOUS
  Administered 2016-04-06: 16:00:00 via INTRAVENOUS

## 2016-04-06 MED ORDER — METOPROLOL TARTARATE 1 MG/ML SYRINGE (5ML)
Status: DC | PRN
  Administered 2016-04-06 (×3): 2.5 mg via INTRAVENOUS

## 2016-04-06 MED ORDER — KETAMINE HCL 10 MG/ML IJ SOLN
INTRAMUSCULAR | Status: DC | PRN
  Administered 2016-04-06 (×2): 20 mg via INTRAVENOUS

## 2016-04-06 MED ORDER — FENTANYL CITRATE (PF) 100 MCG/2ML IJ SOLN
INTRAMUSCULAR | Status: AC
  Filled 2016-04-06: qty 2

## 2016-04-06 MED ORDER — CLONIDINE HCL 0.1 MG PO TABS
0.1000 mg | ORAL_TABLET | Freq: Two times a day (BID) | ORAL | Status: DC
  Administered 2016-04-06: 0.1 mg via ORAL
  Filled 2016-04-06: qty 1

## 2016-04-06 MED ORDER — THROMBIN 20000 UNITS EX SOLR
CUTANEOUS | Status: AC
  Filled 2016-04-06: qty 20000

## 2016-04-06 MED ORDER — THROMBIN 20000 UNITS EX KIT
PACK | CUTANEOUS | Status: DC | PRN
  Administered 2016-04-06: 20000 [IU] via TOPICAL

## 2016-04-06 MED ORDER — METOPROLOL TARTRATE 5 MG/5ML IV SOLN
INTRAVENOUS | Status: AC
  Filled 2016-04-06: qty 5

## 2016-04-06 MED ORDER — LIDOCAINE 2% (20 MG/ML) 5 ML SYRINGE
INTRAMUSCULAR | Status: AC
  Filled 2016-04-06: qty 5

## 2016-04-06 MED ORDER — LUBIPROSTONE 24 MCG PO CAPS
24.0000 ug | ORAL_CAPSULE | Freq: Two times a day (BID) | ORAL | Status: DC
  Filled 2016-04-06: qty 1

## 2016-04-06 MED ORDER — ONDANSETRON HCL 4 MG/2ML IJ SOLN: 4.0000 mg | Freq: Once | INTRAMUSCULAR | Status: DC | PRN

## 2016-04-06 MED ORDER — DULOXETINE HCL 30 MG PO CPEP
60.0000 mg | ORAL_CAPSULE | Freq: Two times a day (BID) | ORAL | Status: DC
  Administered 2016-04-06: 60 mg via ORAL
  Filled 2016-04-06: qty 2

## 2016-04-06 MED ORDER — POLYETHYLENE GLYCOL 3350 17 G PO PACK: 17.0000 g | PACK | Freq: Every day | ORAL | Status: DC | PRN

## 2016-04-06 MED ORDER — ALUM & MAG HYDROXIDE-SIMETH 200-200-20 MG/5ML PO SUSP: 30.0000 mL | Freq: Four times a day (QID) | ORAL | Status: DC | PRN

## 2016-04-06 MED ORDER — DEXTROSE 5 % IV SOLN
INTRAVENOUS | Status: DC | PRN
  Administered 2016-04-06: 25 ug/min via INTRAVENOUS

## 2016-04-06 MED ORDER — PHENYLEPHRINE HCL 10 MG/ML IJ SOLN
INTRAMUSCULAR | Status: DC | PRN
  Administered 2016-04-06: 80 ug via INTRAVENOUS

## 2016-04-06 MED ORDER — MENTHOL 3 MG MT LOZG: 1.0000 | LOZENGE | OROMUCOSAL | Status: DC | PRN

## 2016-04-06 MED ORDER — LACTATED RINGERS IV SOLN
INTRAVENOUS | Status: DC | PRN
  Administered 2016-04-06: 15:00:00 via INTRAVENOUS

## 2016-04-06 MED ORDER — PROPOFOL 10 MG/ML IV BOLUS
INTRAVENOUS | Status: AC
  Filled 2016-04-06: qty 20

## 2016-04-06 MED ORDER — FENTANYL CITRATE (PF) 100 MCG/2ML IJ SOLN
25.0000 ug | INTRAMUSCULAR | Status: DC | PRN
  Administered 2016-04-06 (×2): 50 ug via INTRAVENOUS
  Administered 2016-04-06: 25 ug via INTRAVENOUS

## 2016-04-06 MED ORDER — ALBUTEROL SULFATE HFA 108 (90 BASE) MCG/ACT IN AERS
INHALATION_SPRAY | RESPIRATORY_TRACT | Status: DC | PRN
  Administered 2016-04-06: 2 via RESPIRATORY_TRACT

## 2016-04-06 MED ORDER — BUPIVACAINE LIPOSOME 1.3 % IJ SUSP
20.0000 mL | Freq: Once | INTRAMUSCULAR | Status: DC
  Filled 2016-04-06: qty 20

## 2016-04-06 MED ORDER — OXYCODONE-ACETAMINOPHEN 5-325 MG PO TABS
1.0000 | ORAL_TABLET | ORAL | Status: DC | PRN
  Administered 2016-04-06: 1 via ORAL
  Administered 2016-04-07 (×3): 2 via ORAL
  Filled 2016-04-06 (×4): qty 2

## 2016-04-06 MED ORDER — DOXEPIN HCL 50 MG PO CAPS
50.0000 mg | ORAL_CAPSULE | Freq: Two times a day (BID) | ORAL | Status: DC
  Administered 2016-04-06: 50 mg via ORAL
  Filled 2016-04-06 (×2): qty 1

## 2016-04-06 MED ORDER — ONDANSETRON HCL 4 MG/2ML IJ SOLN
INTRAMUSCULAR | Status: AC
  Filled 2016-04-06: qty 2

## 2016-04-06 MED ORDER — POVIDONE-IODINE 7.5 % EX SOLN
Freq: Once | CUTANEOUS | Status: DC
  Filled 2016-04-06: qty 118

## 2016-04-06 MED ORDER — LACTATED RINGERS IV SOLN
INTRAVENOUS | Status: DC
  Administered 2016-04-06: 50 mL/h via INTRAVENOUS
  Administered 2016-04-06: 14:00:00 via INTRAVENOUS

## 2016-04-06 MED ORDER — FENTANYL CITRATE (PF) 100 MCG/2ML IJ SOLN
INTRAMUSCULAR | Status: DC | PRN
  Administered 2016-04-06 (×2): 50 ug via INTRAVENOUS
  Administered 2016-04-06: 150 ug via INTRAVENOUS
  Administered 2016-04-06: 50 ug via INTRAVENOUS

## 2016-04-06 MED ORDER — ACETAMINOPHEN 325 MG PO TABS: 650.0000 mg | ORAL_TABLET | ORAL | Status: DC | PRN

## 2016-04-06 MED ORDER — PROPOFOL 1000 MG/100ML IV EMUL
INTRAVENOUS | Status: AC
  Filled 2016-04-06: qty 100

## 2016-04-06 MED ORDER — PROMETHAZINE HCL 25 MG/ML IJ SOLN: 6.2500 mg | INTRAMUSCULAR | Status: DC | PRN

## 2016-04-06 MED ORDER — PROPOFOL 10 MG/ML IV BOLUS
INTRAVENOUS | Status: DC | PRN
  Administered 2016-04-06: 150 mg via INTRAVENOUS
  Administered 2016-04-06: 50 mg via INTRAVENOUS

## 2016-04-06 MED ORDER — BUDESONIDE 0.25 MG/2ML IN SUSP
0.2500 mg | Freq: Two times a day (BID) | RESPIRATORY_TRACT | Status: DC
  Administered 2016-04-07: 0.25 mg via RESPIRATORY_TRACT
  Filled 2016-04-06 (×2): qty 2

## 2016-04-06 MED ORDER — FENTANYL CITRATE (PF) 100 MCG/2ML IJ SOLN
INTRAMUSCULAR | Status: AC
  Filled 2016-04-06: qty 4

## 2016-04-06 MED ORDER — 0.9 % SODIUM CHLORIDE (POUR BTL) OPTIME
TOPICAL | Status: DC | PRN
  Administered 2016-04-06: 1000 mL

## 2016-04-06 MED ORDER — LACTATED RINGERS IV SOLN
INTRAVENOUS | Status: DC
  Administered 2016-04-06: 18:00:00 via INTRAVENOUS

## 2016-04-06 MED ORDER — UREA 10 % EX LOTN
1.0000 "application " | TOPICAL_LOTION | Freq: Every day | CUTANEOUS | Status: DC
  Filled 2016-04-06: qty 177

## 2016-04-06 MED ORDER — BISACODYL 5 MG PO TBEC: 5.0000 mg | DELAYED_RELEASE_TABLET | Freq: Every day | ORAL | Status: DC | PRN

## 2016-04-06 MED ORDER — PHENOL 1.4 % MT LIQD: 1.0000 | OROMUCOSAL | Status: DC | PRN

## 2016-04-06 MED ORDER — LIDOCAINE-EPINEPHRINE (PF) 1 %-1:200000 IJ SOLN
INTRAMUSCULAR | Status: DC | PRN
  Administered 2016-04-06: 20 mL via INTRADERMAL

## 2016-04-06 MED ORDER — BUPIVACAINE LIPOSOME 1.3 % IJ SUSP
INTRAMUSCULAR | Status: DC | PRN
  Administered 2016-04-06: 20 mL

## 2016-04-06 MED ORDER — SODIUM CHLORIDE 0.9% FLUSH: 3.0000 mL | INTRAVENOUS | Status: DC | PRN

## 2016-04-06 MED ORDER — ACETAMINOPHEN 650 MG RE SUPP: 650.0000 mg | RECTAL | Status: DC | PRN

## 2016-04-06 MED ORDER — MEGESTROL ACETATE 20 MG PO TABS
20.0000 mg | ORAL_TABLET | Freq: Two times a day (BID) | ORAL | Status: DC
  Administered 2016-04-06: 20 mg via ORAL
  Filled 2016-04-06 (×2): qty 1

## 2016-04-06 MED ORDER — VARENICLINE TARTRATE 0.5 MG PO TABS
0.5000 mg | ORAL_TABLET | Freq: Two times a day (BID) | ORAL | Status: DC
  Administered 2016-04-06: 0.5 mg via ORAL
  Filled 2016-04-06 (×2): qty 1

## 2016-04-06 MED ORDER — ROCURONIUM BROMIDE 100 MG/10ML IV SOLN
INTRAVENOUS | Status: DC | PRN
  Administered 2016-04-06: 50 mg via INTRAVENOUS
  Administered 2016-04-06: 20 mg via INTRAVENOUS

## 2016-04-06 MED ORDER — PROPRANOLOL HCL 10 MG PO TABS
10.0000 mg | ORAL_TABLET | Freq: Two times a day (BID) | ORAL | Status: DC
  Administered 2016-04-06: 10 mg via ORAL
  Filled 2016-04-06 (×2): qty 1

## 2016-04-06 MED ORDER — ROCURONIUM BROMIDE 10 MG/ML (PF) SYRINGE
PREFILLED_SYRINGE | INTRAVENOUS | Status: AC
  Filled 2016-04-06: qty 10

## 2016-04-06 MED ORDER — SODIUM CHLORIDE 0.9 % IV SOLN
INTRAVENOUS | Status: DC | PRN
  Administered 2016-04-06: .01 mg via INTRAVENOUS

## 2016-04-06 MED ORDER — MIDAZOLAM HCL 2 MG/2ML IJ SOLN
INTRAMUSCULAR | Status: AC
  Filled 2016-04-06: qty 2

## 2016-04-06 MED ORDER — MIDAZOLAM HCL 2 MG/2ML IJ SOLN
INTRAMUSCULAR | Status: DC | PRN
  Administered 2016-04-06 (×2): 1 mg via INTRAVENOUS

## 2016-04-06 MED ORDER — ZOLPIDEM TARTRATE 5 MG PO TABS: 5.0000 mg | ORAL_TABLET | Freq: Every evening | ORAL | Status: DC | PRN

## 2016-04-06 MED ORDER — SODIUM CHLORIDE 0.9 % IV SOLN
INTRAVENOUS | Status: DC | PRN
  Administered 2016-04-06: 14:00:00 via INTRAVENOUS

## 2016-04-06 MED ORDER — CEFAZOLIN SODIUM-DEXTROSE 2-4 GM/100ML-% IV SOLN
2.0000 g | INTRAVENOUS | Status: AC
  Administered 2016-04-06: 2 g via INTRAVENOUS

## 2016-04-06 MED ORDER — LAMOTRIGINE 100 MG PO TABS
100.0000 mg | ORAL_TABLET | Freq: Every day | ORAL | Status: DC
  Filled 2016-04-06: qty 1

## 2016-04-06 MED ORDER — LIDOCAINE-EPINEPHRINE (PF) 1 %-1:200000 IJ SOLN
INTRAMUSCULAR | Status: AC
  Filled 2016-04-06: qty 30

## 2016-04-06 MED ORDER — SUGAMMADEX SODIUM 200 MG/2ML IV SOLN
INTRAVENOUS | Status: AC
  Filled 2016-04-06: qty 2

## 2016-04-06 MED ORDER — PANTOPRAZOLE SODIUM 40 MG PO TBEC: 80.0000 mg | DELAYED_RELEASE_TABLET | Freq: Every day | ORAL | Status: DC

## 2016-04-06 MED ORDER — FLEET ENEMA 7-19 GM/118ML RE ENEM: 1.0000 | ENEMA | Freq: Once | RECTAL | Status: DC | PRN

## 2016-04-06 MED ORDER — ARTIFICIAL TEARS OP OINT
TOPICAL_OINTMENT | OPHTHALMIC | Status: AC
  Filled 2016-04-06: qty 3.5

## 2016-04-06 MED ORDER — CEFAZOLIN SODIUM-DEXTROSE 2-4 GM/100ML-% IV SOLN
INTRAVENOUS | Status: AC
  Filled 2016-04-06: qty 100

## 2016-04-06 MED ORDER — ARTIFICIAL TEARS OP OINT
TOPICAL_OINTMENT | OPHTHALMIC | Status: DC | PRN
  Administered 2016-04-06: 1 via OPHTHALMIC

## 2016-04-06 MED ORDER — GEMFIBROZIL 600 MG PO TABS
600.0000 mg | ORAL_TABLET | Freq: Two times a day (BID) | ORAL | Status: DC
  Administered 2016-04-07: 600 mg via ORAL
  Filled 2016-04-06: qty 1

## 2016-04-06 MED ORDER — OLOPATADINE HCL 0.1 % OP SOLN
1.0000 [drp] | Freq: Two times a day (BID) | OPHTHALMIC | Status: DC
  Administered 2016-04-06: 1 [drp] via OPHTHALMIC
  Filled 2016-04-06: qty 5

## 2016-04-06 MED ORDER — DOCUSATE SODIUM 100 MG PO CAPS
100.0000 mg | ORAL_CAPSULE | Freq: Two times a day (BID) | ORAL | Status: DC
  Administered 2016-04-06: 100 mg via ORAL
  Filled 2016-04-06: qty 1

## 2016-04-06 MED ORDER — FLUTICASONE PROPIONATE 50 MCG/ACT NA SUSP
1.0000 | Freq: Every day | NASAL | Status: DC
  Filled 2016-04-06: qty 16

## 2016-04-06 MED ORDER — TRIAZOLAM 0.125 MG PO TABS: 0.2500 mg | ORAL_TABLET | Freq: Every evening | ORAL | Status: DC | PRN

## 2016-04-06 MED ORDER — GLYCOPYRROLATE 0.2 MG/ML IJ SOLN
INTRAMUSCULAR | Status: DC | PRN
  Administered 2016-04-06: 0.1 mg via INTRAVENOUS

## 2016-04-06 MED ORDER — SODIUM CHLORIDE 0.9% FLUSH
3.0000 mL | Freq: Two times a day (BID) | INTRAVENOUS | Status: DC
  Administered 2016-04-06: 3 mL via INTRAVENOUS

## 2016-04-06 SURGICAL SUPPLY — 91 items
APL SKNCLS STERI-STRIP NONHPOA (GAUZE/BANDAGES/DRESSINGS) ×1
BENZOIN TINCTURE PRP APPL 2/3 (GAUZE/BANDAGES/DRESSINGS) ×2 IMPLANT
BIT DRILL 3.2 (BIT) ×2
BIT DRILL 65X3.2XQC STP NS (BIT) IMPLANT
BIT DRL 65X3.2XQC STP NS (BIT) ×1
BLADE SURG ROTATE 9660 (MISCELLANEOUS) IMPLANT
BUR PRESCISION 1.7 ELITE (BURR) ×2 IMPLANT
BUR ROUND PRECISION 4.0 (BURR) ×1 IMPLANT
BUR SABER RD CUTTING 3.0 (BURR) IMPLANT
CAGE BULLET CONCORDE 9X9X27 (Cage) ×1 IMPLANT
CARTRIDGE OIL MAESTRO DRILL (MISCELLANEOUS) ×1 IMPLANT
CATH FOLEY LATEX FREE 16FR (CATHETERS) ×2
CATH FOLEY LF 16FR (CATHETERS) IMPLANT
CLSR STERI-STRIP ANTIMIC 1/2X4 (GAUZE/BANDAGES/DRESSINGS) ×1 IMPLANT
CONT SPEC STER OR (MISCELLANEOUS) ×1 IMPLANT
COVER MAYO STAND STRL (DRAPES) ×4 IMPLANT
COVER SURGICAL LIGHT HANDLE (MISCELLANEOUS) ×2 IMPLANT
DIFFUSER DRILL AIR PNEUMATIC (MISCELLANEOUS) ×2 IMPLANT
DRAIN CHANNEL 15F RND FF W/TCR (WOUND CARE) IMPLANT
DRAPE C-ARM 42X72 X-RAY (DRAPES) ×2 IMPLANT
DRAPE C-ARMOR (DRAPES) ×1 IMPLANT
DRAPE POUCH INSTRU U-SHP 10X18 (DRAPES) ×2 IMPLANT
DRAPE SURG 17X23 STRL (DRAPES) ×8 IMPLANT
DURAPREP 26ML APPLICATOR (WOUND CARE) ×2 IMPLANT
ELECT BLADE 4.0 EZ CLEAN MEGAD (MISCELLANEOUS) ×2
ELECT CAUTERY BLADE 6.4 (BLADE) ×2 IMPLANT
ELECT REM PT RETURN 9FT ADLT (ELECTROSURGICAL) ×2
ELECTRODE BLDE 4.0 EZ CLN MEGD (MISCELLANEOUS) ×1 IMPLANT
ELECTRODE REM PT RTRN 9FT ADLT (ELECTROSURGICAL) ×1 IMPLANT
EVACUATOR SILICONE 100CC (DRAIN) IMPLANT
FEE INTRAOP MONITOR IMPULS NCS (MISCELLANEOUS) IMPLANT
GAUZE SPONGE 4X4 12PLY STRL (GAUZE/BANDAGES/DRESSINGS) ×2 IMPLANT
GAUZE SPONGE 4X4 16PLY XRAY LF (GAUZE/BANDAGES/DRESSINGS) ×2 IMPLANT
GLOVE BIO SURGEON STRL SZ7 (GLOVE) ×2 IMPLANT
GLOVE BIO SURGEON STRL SZ8 (GLOVE) ×2 IMPLANT
GLOVE BIOGEL PI IND STRL 7.0 (GLOVE) ×1 IMPLANT
GLOVE BIOGEL PI IND STRL 8 (GLOVE) ×1 IMPLANT
GLOVE BIOGEL PI INDICATOR 7.0 (GLOVE) ×1
GLOVE BIOGEL PI INDICATOR 8 (GLOVE) ×1
GOWN STRL REUS W/ TWL LRG LVL3 (GOWN DISPOSABLE) ×2 IMPLANT
GOWN STRL REUS W/ TWL XL LVL3 (GOWN DISPOSABLE) ×1 IMPLANT
GOWN STRL REUS W/TWL LRG LVL3 (GOWN DISPOSABLE) ×4
GOWN STRL REUS W/TWL XL LVL3 (GOWN DISPOSABLE) ×2
INTRAOP MONITOR FEE IMPULS NCS (MISCELLANEOUS) ×1
INTRAOP MONITOR FEE IMPULSE (MISCELLANEOUS) ×1
IV CATH 14GX2 1/4 (CATHETERS) ×2 IMPLANT
KIT BASIN OR (CUSTOM PROCEDURE TRAY) ×2 IMPLANT
KIT POSITION SURG JACKSON T1 (MISCELLANEOUS) ×2 IMPLANT
KIT ROOM TURNOVER OR (KITS) ×2 IMPLANT
MARKER SKIN DUAL TIP RULER LAB (MISCELLANEOUS) ×2 IMPLANT
MIX DBX 10CC 35% BONE (Bone Implant) ×1 IMPLANT
NDL HYPO 25GX1X1/2 BEV (NEEDLE) ×1 IMPLANT
NDL SAFETY ECLIPSE 18X1.5 (NEEDLE) ×1 IMPLANT
NDL SPNL 18GX3.5 QUINCKE PK (NEEDLE) ×2 IMPLANT
NEEDLE 22X1 1/2 (OR ONLY) (NEEDLE) ×2 IMPLANT
NEEDLE HYPO 18GX1.5 SHARP (NEEDLE) ×2
NEEDLE HYPO 25GX1X1/2 BEV (NEEDLE) ×2 IMPLANT
NEEDLE SPNL 18GX3.5 QUINCKE PK (NEEDLE) ×4 IMPLANT
NS IRRIG 1000ML POUR BTL (IV SOLUTION) ×2 IMPLANT
OIL CARTRIDGE MAESTRO DRILL (MISCELLANEOUS) ×2
PACK LAMINECTOMY ORTHO (CUSTOM PROCEDURE TRAY) ×2 IMPLANT
PACK UNIVERSAL I (CUSTOM PROCEDURE TRAY) ×2 IMPLANT
PAD ARMBOARD 7.5X6 YLW CONV (MISCELLANEOUS) ×4 IMPLANT
PATTIES SURGICAL .5 X1 (DISPOSABLE) ×2 IMPLANT
PATTIES SURGICAL .5X1.5 (GAUZE/BANDAGES/DRESSINGS) ×2 IMPLANT
ROD PRE LORDOSED 5.5X45 (Rod) ×2 IMPLANT
SCREW SET SINGLE INNER (Screw) ×4 IMPLANT
SCREW VIPER CORT FIX 6.00X30 (Screw) ×2 IMPLANT
SCREW VIPER CORT FIX 6X35 (Screw) ×2 IMPLANT
SPONGE GAUZE 4X4 12PLY STER LF (GAUZE/BANDAGES/DRESSINGS) ×1 IMPLANT
SPONGE INTESTINAL PEANUT (DISPOSABLE) ×4 IMPLANT
SPONGE SURGIFOAM ABS GEL 100 (HEMOSTASIS) ×2 IMPLANT
STRIP CLOSURE SKIN 1/2X4 (GAUZE/BANDAGES/DRESSINGS) ×4 IMPLANT
SURGIFLO W/THROMBIN 8M KIT (HEMOSTASIS) IMPLANT
SUT BONE WAX W31G (SUTURE) ×1 IMPLANT
SUT MNCRL AB 4-0 PS2 18 (SUTURE) ×2 IMPLANT
SUT VIC AB 0 CT1 18XCR BRD 8 (SUTURE) ×1 IMPLANT
SUT VIC AB 0 CT1 8-18 (SUTURE) ×2
SUT VIC AB 1 CT1 18XCR BRD 8 (SUTURE) ×1 IMPLANT
SUT VIC AB 1 CT1 8-18 (SUTURE) ×2
SUT VIC AB 2-0 CT2 18 VCP726D (SUTURE) ×2 IMPLANT
SYR 20CC LL (SYRINGE) ×2 IMPLANT
SYR BULB IRRIGATION 50ML (SYRINGE) ×2 IMPLANT
SYR CONTROL 10ML LL (SYRINGE) ×5 IMPLANT
SYR TB 1ML LUER SLIP (SYRINGE) ×2 IMPLANT
TAPE CLOTH SURG 4X10 WHT LF (GAUZE/BANDAGES/DRESSINGS) ×1 IMPLANT
TOWEL OR 17X24 6PK STRL BLUE (TOWEL DISPOSABLE) ×2 IMPLANT
TOWEL OR 17X26 10 PK STRL BLUE (TOWEL DISPOSABLE) ×2 IMPLANT
TRAY FOLEY CATH 16FRSI W/METER (SET/KITS/TRAYS/PACK) ×1 IMPLANT
WATER STERILE IRR 1000ML POUR (IV SOLUTION) ×1 IMPLANT
YANKAUER SUCT BULB TIP NO VENT (SUCTIONS) ×2 IMPLANT
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
## 2016-04-06 NOTE — Anesthesia Procedure Notes (Signed)
Procedure Name: Intubation Date/Time: 04/06/2016 1:30 PM Performed by: Suella Broad D Pre-anesthesia Checklist: Patient identified, Emergency Drugs available, Suction available and Patient being monitored Patient Re-evaluated:Patient Re-evaluated prior to inductionOxygen Delivery Method: Circle system utilized Preoxygenation: Pre-oxygenation with 100% oxygen Intubation Type: IV induction Ventilation: Mask ventilation without difficulty Laryngoscope Size: Mac and 3 Grade View: Grade I Tube type: Oral Tube size: 7.0 mm Number of attempts: 1 Airway Equipment and Method: Stylet Placement Confirmation: ETT inserted through vocal cords under direct vision,  positive ETCO2 and breath sounds checked- equal and bilateral Secured at: 23 cm Tube secured with: Tape Dental Injury: Teeth and Oropharynx as per pre-operative assessment

## 2016-04-06 NOTE — Anesthesia Preprocedure Evaluation (Addendum)
Anesthesia Evaluation  Patient identified by MRN, date of birth, ID band Patient awake    Reviewed: Allergy & Precautions, NPO status , Patient's Chart, lab work & pertinent test results  Airway Mallampati: II  TM Distance: >3 FB Neck ROM: Full    Dental  (+) Teeth Intact, Dental Advisory Given   Pulmonary asthma , Current Smoker,    breath sounds clear to auscultation       Cardiovascular hypertension,  Rhythm:Regular Rate:Normal     Neuro/Psych  Headaches, PSYCHIATRIC DISORDERS Anxiety Depression Bipolar Disorder  Neuromuscular disease    GI/Hepatic Neg liver ROS, GERD  Medicated,  Endo/Other  negative endocrine ROS  Renal/GU negative Renal ROS  negative genitourinary   Musculoskeletal  (+) Arthritis , Osteoarthritis,    Abdominal   Peds negative pediatric ROS (+)  Hematology negative hematology ROS (+)   Anesthesia Other Findings   Reproductive/Obstetrics negative OB ROS                            Lab Results  Component Value Date   WBC 10.2 03/30/2016   HGB 13.9 03/30/2016   HCT 39.7 03/30/2016   MCV 91.3 03/30/2016   PLT 373 03/30/2016   Lab Results  Component Value Date   CREATININE 0.91 03/30/2016   BUN <5 (L) 03/30/2016   NA 137 03/30/2016   K 4.2 03/30/2016   CL 105 03/30/2016   CO2 24 03/30/2016   Lab Results  Component Value Date   INR 0.92 03/30/2016   02/2016 EKG: sinus tachycardia.  Anesthesia Physical Anesthesia Plan  ASA: II  Anesthesia Plan: General   Post-op Pain Management:    Induction: Intravenous  Airway Management Planned: Oral ETT  Additional Equipment:   Intra-op Plan:   Post-operative Plan: Extubation in OR  Informed Consent: I have reviewed the patients History and Physical, chart, labs and discussed the procedure including the risks, benefits and alternatives for the proposed anesthesia with the patient or authorized representative  who has indicated his/her understanding and acceptance.   Dental advisory given  Plan Discussed with: CRNA  Anesthesia Plan Comments: ([name] unable to remove some of her piercings, she is aware of the risk for burn and skin damage. She would like to proceed. )       Anesthesia Quick Evaluation

## 2016-04-06 NOTE — Transfer of Care (Signed)
Immediate Anesthesia Transfer of Care Note  Patient: Sharon Vazquez  Procedure(s) Performed: Procedure(s) with comments: LEFT SIDED LUMBAR 3-4 TRANSFORAMINAL LUMBAR INTERBODY FUSION WITH INSTRUMENATION AND ALLOGRAFT; LUMBAR 3-4 REVISION DECOMPRESSION (Left) - LEFT SIDED LUMBAR 3-4 TRANSFORAMINAL LUMBAR INTERBODY FUSION WITH INSTRUMENATION AND ALLOGRAFT; LUMBAR 3-4 REVISION DECOMPRESSION  Patient Location: PACU  Anesthesia Type:General  Level of Consciousness: awake  Airway & Oxygen Therapy: Patient Spontanous Breathing and Patient connected to face mask oxygen  Post-op Assessment: Report given to RN and Post -op Vital signs reviewed and stable  Post vital signs: Reviewed and stable  Last Vitals:  Vitals:   04/06/16 1800 04/06/16 1815  BP: 121/65 109/63  Pulse:    Temp: 36.3 C     Last Pain:  Vitals:   04/06/16 1815  TempSrc:   PainSc: 7       Patients Stated Pain Goal: 3 ([id number])  Complications: No apparent anesthesia complications

## 2016-04-06 NOTE — Anesthesia Postprocedure Evaluation (Signed)
Anesthesia Post Note  Patient: MCKALA FONTENOT  Procedure(s) Performed: Procedure(s) (LRB): LEFT SIDED LUMBAR 3-4 TRANSFORAMINAL LUMBAR INTERBODY FUSION WITH INSTRUMENATION AND ALLOGRAFT; LUMBAR 3-4 REVISION DECOMPRESSION (Left)  Patient location during evaluation: PACU Anesthesia Type: General Level of consciousness: awake, awake and alert and oriented Pain management: pain level controlled Vital Signs Assessment: post-procedure vital signs reviewed and stable Respiratory status: spontaneous breathing, nonlabored ventilation and respiratory function stable Cardiovascular status: blood pressure returned to baseline Anesthetic complications: no    Last Vitals:  Vitals:   04/06/16 2000 04/06/16 2045  BP: 107/72 112/62  Pulse:  92  Resp:  20  Temp: 36.4 C 37.2 C    Last Pain:  Vitals:   04/06/16 2045  TempSrc: Oral  PainSc:                  Jozee Hammer COKER

## 2016-04-07 MED FILL — Thrombin For Soln 20000 Unit: CUTANEOUS | Qty: 1 | Status: AC

## 2016-04-07 NOTE — Evaluation (Signed)
Physical Therapy Evaluation Patient Details Name: ALEXXIS FLEURIMOND MRN: [id number] DOB: 07/06/1980 Today's Date: 04/07/2016   History of Present Illness  Pt is a 35 y/o female with an extensive history of back surgery/procedures. Pt presents s/p L3-L4 posterior lumbar fusion on 04/06/16.  Clinical Impression  Pt admitted with above diagnosis. Pt currently with functional limitations due to the deficits listed below (see PT Problem List). At the time of PT eval pt was able to perform transfers and ambulation with min guard to supervision for safety. Brace not present during session and functional mobility was limited. Pt will benefit from skilled PT to increase their independence and safety with mobility to allow discharge to the venue listed below.      Follow Up Recommendations No PT follow up;Supervision for mobility/OOB    Equipment Recommendations  None recommended by PT    Recommendations for Other Services       Precautions / Restrictions Precautions Precautions: Fall;Back Precaution Booklet Issued: Yes (comment) Precaution Comments: Reviewed precautions and pt was cued during functional mobility.  Required Braces or Orthoses: Spinal Brace Spinal Brace: Thoracolumbosacral orthotic (Not present during session) Restrictions Weight Bearing Restrictions: No      Mobility  Bed Mobility Overal bed mobility: Needs Assistance Bed Mobility: Rolling;Sidelying to Sit;Sit to Sidelying Rolling: Supervision Sidelying to sit: Supervision     Sit to sidelying: Supervision General bed mobility comments: VC's for logroll technique. Pt was able to transition to/from EOB with increased time. HOB flat and rails lowered.   Transfers Overall transfer level: Needs assistance Equipment used: Rolling walker (2 wheeled) Transfers: Sit to/from Stand Sit to Stand: Min guard         General transfer comment: VC's for hand placement on seated surface for safety.    Ambulation/Gait Ambulation/Gait assistance: Supervision Ambulation Distance (Feet): 20 Feet Assistive device: Rolling walker (2 wheeled) Gait Pattern/deviations: Step-through pattern;Decreased stride length;Trunk flexed Gait velocity: Decreased Gait velocity interpretation: Below normal speed for age/gender General Gait Details: Limited gait training distance due to brace not present in room. Pt ambulated to the bathroom and back only.   Stairs            Wheelchair Mobility    Modified Rankin (Stroke Patients Only)       Balance Overall balance assessment: Needs assistance Sitting-balance support: Feet supported;No upper extremity supported Sitting balance-Leahy Scale: Fair     Standing balance support: No upper extremity supported Standing balance-Leahy Scale: Fair                               Pertinent Vitals/Pain Pain Assessment: Faces Faces Pain Scale: Hurts little more Pain Location: Incision site Pain Descriptors / Indicators: Operative site guarding Pain Intervention(s): Limited activity within patient's tolerance;Monitored during session;Repositioned    Home Living Family/patient expects to be discharged to:: Private residence Living Arrangements: Alone Available Help at Discharge: Family;Available PRN/intermittently Type of Home: House Home Access: Stairs to enter   Entrance Stairs-Number of Steps: 3 Home Layout: One level Home Equipment: Walker - 2 wheels;Cane - single point      Prior Function Level of Independence: Independent               Hand Dominance        Extremity/Trunk Assessment   Upper Extremity Assessment: Defer to OT evaluation           Lower Extremity Assessment: Generalized weakness      Cervical /  Trunk Assessment: Other exceptions  Communication   Communication: No difficulties  Cognition Arousal/Alertness: Awake/alert Behavior During Therapy: WFL for tasks assessed/performed Overall  Cognitive Status: Within Functional Limits for tasks assessed                      General Comments      Exercises     Assessment/Plan    PT Assessment Patient needs continued PT services  PT Problem List Decreased strength;Decreased range of motion;Decreased activity tolerance;Decreased mobility;Decreased balance;Decreased knowledge of use of DME;Decreased safety awareness;Decreased knowledge of precautions;Pain          PT Treatment Interventions DME instruction;Gait training;Stair training;Functional mobility training;Therapeutic activities;Therapeutic exercise;Neuromuscular re-education;Patient/family education    PT Goals (Current goals can be found in the Care Plan section)  Acute Rehab PT Goals Patient Stated Goal: Feel better PT Goal Formulation: With patient Time For Goal Achievement: 04/14/16 Potential to Achieve Goals: Good    Frequency Min 5X/week   Barriers to discharge Decreased caregiver support      Co-evaluation               End of Session   Activity Tolerance: Patient tolerated treatment well Patient left: in bed;with call bell/phone within reach Nurse Communication: Mobility status         Time: [id number] PT Time Calculation (min) (ACUTE ONLY): 28 min   Charges:   PT Evaluation $PT Eval Moderate Complexity: 1 Procedure PT Treatments $Gait Training: 8-22 mins   PT G Codes:        Rolinda Roan 04/22/16, 9:46 AM   Rolinda Roan, PT, DPT Acute Rehabilitation Services Pager: [phone]

## 2016-04-07 NOTE — Addendum Note (Signed)
Addendum  created 04/07/16 1336 by Effie Berkshire, MD   Sign clinical note

## 2016-04-07 NOTE — Progress Notes (Signed)
Orthopedic Tech Progress Note Patient Details:  Sharon Vazquez 1980/07/26 [id number]  Patient ID: Sharon Vazquez, female   DOB: [birth year], 35 y.o.   MRN: [id number]   [name] 04/07/2016, 9:32 AM Called in bio-tech brace order; spoke with Junie Panning

## 2016-04-07 NOTE — Progress Notes (Signed)
Pt doing well. Pt and family given D/C instructions with Rx's, verbal understanding was provided. Pt's incision is clean and dry with no sign of infection. Pt's IV was removed prior to D/C. Pt received shower chair and TLSO brace. Pt D/C'd home via wheelchair @ 1120 per MD order. Pt is stable @ D/C and has no other needs at this time. Holli Humbles, RN

## 2016-04-07 NOTE — Progress Notes (Signed)
    Patient doing well Left leg pain resolved Patient's pain is minimal Has been ambulating   Physical Exam: Vitals:   04/06/16 2340 04/07/16 0400  BP: 105/64 104/72  Pulse: (!) 102 95  Resp: 20 18  Temp: 98.7 F (37.1 C) 98.5 F (36.9 C)   Patient appears comfortable Dressing in place NVI  POD #1 s/p revision decompression and L3/4 fusion, doing very well  - up with PT/OT, encourage ambulation - Percocet for pain, Valium for muscle spasms - d/c home later today with f/u in 2 weeks

## 2016-04-08 MED FILL — Heparin Sodium (Porcine) Inj 1000 Unit/ML: INTRAMUSCULAR | Qty: 30 | Status: AC

## 2016-04-08 MED FILL — Sodium Chloride IV Soln 0.9%: INTRAVENOUS | Qty: 1000 | Status: AC

## 2016-04-19 ENCOUNTER — Telehealth (INDEPENDENT_AMBULATORY_CARE_PROVIDER_SITE_OTHER): Payer: Self-pay | Admitting: Internal Medicine

## 2016-04-19 ENCOUNTER — Other Ambulatory Visit (INDEPENDENT_AMBULATORY_CARE_PROVIDER_SITE_OTHER): Payer: Self-pay | Admitting: Internal Medicine

## 2016-04-19 NOTE — Telephone Encounter (Signed)
Patient is still having trouble with bowel movements.  She stated that the Amitiza is not working and Linzess has not worked.  She'd like suggestions on what else to do or maybe try another medication.  [phone]

## 2016-04-19 NOTE — Discharge Summary (Signed)
Patient ID: DIVINA ABARE MRN: [id number] DOB/AGE: 1980/07/05 35 y.o.  Admit date: 04/06/2016 Discharge date: 04/07/2016  Admission Diagnoses:  Active Problems:   Radiculopathy   Discharge Diagnoses:  Same  Past Medical History:  Diagnosis Date  . Abnormal Pap smear   . ADHD (attention deficit hyperactivity disorder)   . Anxiety   . Anxiety    takes Xanax daily as needed  . Arthritis    degenerative spine  . Asthma    Ventolin as needed and QVAR takes daily  . Back spasm    takes Flexeril daily as needed  . Bipolar 1 disorder ([place])   . Bipolar 1 disorder ([facility])    takes DOxepin daily  . Borderline personality disorder   . Chronic back pain    HNP  . Constipation    takes Dulcolax daily as needed takes Amitiza daily  . Contraceptive management 08/23/2013  . Eczema    has 2 creams uses as needed  . GERD (gastroesophageal reflux disease)    takes Tums as needed  . Headache(784.0)   . Headache(784.0)    migraines-last one about 38months ago;takes Topamax daily  . History of bronchitis    last time 4-11yrs ago  . History of colon polyps   . HSV-2 (herpes simplex virus 2) infection   . Hx of chlamydia infection   . Hypertension   . Hypertension    takes INderal and Clonidine daily  . IBS (irritable bowel syndrome)   . Insomnia    takes Ambien nightly  . Internal hemorrhoids   . Irregular periods 04/02/2014  . Joint swelling    right knee  . Mental disorder    takes Abilify as needed  . Obesity   . Panic attack   . Panic disorder   . Sciatica   . Shortness of breath    with exertion  . Urinary urgency   . Weakness    numbness and tingling to left foot    Surgeries: Procedure(s): LEFT SIDED LUMBAR 3-4 TRANSFORAMINAL LUMBAR INTERBODY FUSION WITH INSTRUMENATION AND ALLOGRAFT; LUMBAR 3-4 REVISION DECOMPRESSION on 04/06/2016   Consultants:   Discharged Condition: Improved  Hospital Course: DATRA SCHRAUBEN is an 35 y.o. female who was admitted  04/06/2016 for operative treatment of radiculopathy. Patient has severe unremitting pain that affects sleep, daily activities, and work/hobbies. After pre-op clearance the patient was taken to the operating room on 04/06/2016 and underwent  Procedure(s): LEFT SIDED LUMBAR 3-4 TRANSFORAMINAL LUMBAR INTERBODY FUSION WITH INSTRUMENATION AND ALLOGRAFT; LUMBAR 3-4 REVISION DECOMPRESSION.    Patient was given perioperative antibiotics:  Anti-infectives    Start     Dose/Rate Route Frequency Ordered Stop   04/06/16 2200  ceFAZolin (ANCEF) IVPB 2g/100 mL premix     2 g 200 mL/hr over 30 Minutes Intravenous Every 8 hours 04/06/16 2048 04/07/16 0645   04/06/16 0959  ceFAZolin (ANCEF) 2-4 GM/100ML-% IVPB    Comments:  Leandrew Koyanagi   : cabinet override      04/06/16 0959 04/06/16 1350   04/06/16 0954  ceFAZolin (ANCEF) IVPB 2g/100 mL premix     2 g 200 mL/hr over 30 Minutes Intravenous On call to O.R. 04/06/16 0954 04/06/16 1350       Patient was given sequential compression devices, early ambulation to prevent DVT.  Patient benefited maximally from hospital stay and there were no complications.    Recent vital signs: BP 108/73 (BP Location: Left Arm)   Pulse 95   Temp  98.2 F (36.8 C) (Oral)   Resp 18   Ht 5\' 6"  (1.676 m)   Wt 102.1 kg (225 lb 2 oz)   SpO2 95%   BMI 36.34 kg/m   Discharge Medications:     Medication List    STOP taking these medications   dexamethasone 4 MG tablet Commonly known as:  DECADRON     TAKE these medications   albuterol 108 (90 Base) MCG/ACT inhaler Commonly known as:  PROVENTIL HFA;VENTOLIN HFA Inhale 1-2 puffs into the lungs every 6 (six) hours as needed for wheezing.   ALPRAZolam 3 MG 24 hr tablet Commonly known as:  XANAX XR Take 3 mg by mouth 2 (two) times daily.   ALUVEA 39 % Crea Generic drug:  Urea Apply 1 application topically daily.   beclomethasone 80 MCG/ACT inhaler Commonly known as:  QVAR Inhale 2 puffs into the lungs 2 (two)  times daily.   cloNIDine 0.1 MG tablet Commonly known as:  CATAPRES Take 0.1 mg by mouth 2 (two) times daily.   clotrimazole-betamethasone cream Commonly known as:  LOTRISONE Apply topically 2 (two) times daily.   doxepin 50 MG capsule Commonly known as:  SINEQUAN Take 1 capsule (50 mg total) by mouth at bedtime. What changed:  when to take this   DULoxetine 60 MG capsule Commonly known as:  CYMBALTA Take 60 mg by mouth 2 (two) times daily.   EPINEPHrine 0.3 mg/0.3 mL Soaj injection Commonly known as:  EPI-PEN Inject into the muscle once.   gemfibrozil 600 MG tablet Commonly known as:  LOPID Take 0.5 tablets (300 mg total) by mouth 2 (two) times daily before a meal. What changed:  how much to take   lamoTRIgine 25 MG tablet Commonly known as:  LAMICTAL Take 1 tablet (25 mg total) by mouth daily. What changed:  how much to take   lubiprostone 24 MCG capsule Commonly known as:  AMITIZA Take 1 capsule (24 mcg total) by mouth daily with breakfast. What changed:  when to take this   medroxyPROGESTERone 150 MG/ML injection Commonly known as:  DEPO-PROVERA INJECT 1 VIAL INTRAMUSCULARLY EVERY 3 MONTHS IN OFFICE.   megestrol 40 MG tablet Commonly known as:  MEGACE TAKE 3 TABLETS DAILY FOR 5 DAYS, 2 TABLETS DAILY FOR 5 DAYS, THEN1 TABLET DAILY UNTIL BLEEDING STOPS.   mometasone 50 MCG/ACT nasal spray Commonly known as:  NASONEX Place 2 sprays into the nose daily as needed.   PATADAY 0.2 % Soln Generic drug:  Olopatadine HCl Place 1 drop into both eyes daily as needed (itchy eyes).   pimecrolimus 1 % cream Commonly known as:  ELIDEL Apply topically 2 (two) times daily.   propranolol 10 MG tablet Commonly known as:  INDERAL Take 10 mg by mouth 2 (two) times daily.   triazolam 0.25 MG tablet Commonly known as:  HALCION Take 0.25 mg by mouth at bedtime as needed for sleep.   varenicline 0.5 MG tablet Commonly known as:  CHANTIX Take 0.5 mg by mouth 2 (two) times  daily. Pt taking 1 a day for 3 days, then 2 times a day       Diagnostic Studies: Dg Chest 2 View  Result Date: 03/30/2016 CLINICAL DATA:  Cough, chills for 3 days EXAM: CHEST  2 VIEW COMPARISON:  03/14/2016 FINDINGS: The heart size and mediastinal contours are within normal limits. Both lungs are clear. The visualized skeletal structures are unremarkable. IMPRESSION: No active cardiopulmonary disease. Electronically Signed   By: Lahoma Crocker  M.D.   On: 03/30/2016 13:32   Dg Lumbar Spine 2-3 Views  Result Date: 04/06/2016 CLINICAL DATA:  Lumbar spine surgery EXAM: DG C-ARM 61-120 MIN; LUMBAR SPINE - 2-3 VIEW COMPARISON:  Lumbar spine radiographs 04/06/2016 FINDINGS: Two fluoroscopic images are provided, showing placement of bilateral spinal rods and transpedicular screws at the L3 and L4 levels. IMPRESSION: Fluoroscopic images showing placement of posterior spinal hardware at L3-L4. Electronically Signed   By: Ulyses Jarred M.D.   On: 04/06/2016 17:20   Dg Lumbar Spine 2-3 Views  Result Date: 03/30/2016 CLINICAL DATA:  Preop lumbar fusion EXAM: LUMBAR SPINE - 2-3 VIEW COMPARISON:  None. FINDINGS: There is no evidence of lumbar spine fracture. Alignment is normal. Mild degenerative disc disease with disc height loss at L3-4. IMPRESSION: Degenerative disc disease at L3-4. Electronically Signed   By: Kathreen Devoid   On: 03/30/2016 13:33   Dg Lumbar Spine 1 View  Result Date: 04/06/2016 CLINICAL DATA:  Lumbar L2-L3 interbody fusion EXAM: LUMBAR SPINE - 1 VIEW COMPARISON:  03/30/2016, MRI 02/05/2016 FINDINGS: Single lateral view of the lumbar spine submitted. Lumbar numbering performed in similar fashion to MRI 02/05/2016. Lumbar alignment within normal limits. There are degenerative changes present at L3-L4. Linear radiopaque probes are present posterior to the spinous process of L3 and L4. IMPRESSION: 1. Mild degenerative changes at L3-L4. 2. Linear radiopaque markers/probes posterior to spinous  process of L3 and L4. Electronically Signed   By: Donavan Foil M.D.   On: 04/06/2016 14:17   Dg C-arm 61-120 Min  Result Date: 04/06/2016 CLINICAL DATA:  Lumbar spine surgery EXAM: DG C-ARM 61-120 MIN; LUMBAR SPINE - 2-3 VIEW COMPARISON:  Lumbar spine radiographs 04/06/2016 FINDINGS: Two fluoroscopic images are provided, showing placement of bilateral spinal rods and transpedicular screws at the L3 and L4 levels. IMPRESSION: Fluoroscopic images showing placement of posterior spinal hardware at L3-L4. Electronically Signed   By: Ulyses Jarred M.D.   On: 04/06/2016 17:20    Disposition: 01-Home or Self Care    POD #1 s/p revision decompression and L3/4 fusion, doing very well  -Written scripts for pain signed and in chart -D/C instructions sheet printed and in chart -D/C today  -F/U in office 2 weeks   Signed: Justice Britain 04/19/2016, 12:58 PM

## 2016-04-21 NOTE — Telephone Encounter (Signed)
Per Dr.Rehman the patient may try Trulance. Patient was called and made aware.

## 2016-04-21 NOTE — Telephone Encounter (Signed)
This will be addressed with Dr.Rehman. 

## 2016-05-02 ENCOUNTER — Encounter: Payer: Self-pay | Admitting: *Deleted

## 2016-05-02 ENCOUNTER — Ambulatory Visit (INDEPENDENT_AMBULATORY_CARE_PROVIDER_SITE_OTHER): Payer: Medicaid Other | Admitting: *Deleted

## 2016-05-02 ENCOUNTER — Encounter (INDEPENDENT_AMBULATORY_CARE_PROVIDER_SITE_OTHER): Payer: Self-pay | Admitting: Internal Medicine

## 2016-05-02 DIAGNOSIS — Z308 Encounter for other contraceptive management: Secondary | ICD-10-CM

## 2016-05-02 DIAGNOSIS — Z3202 Encounter for pregnancy test, result negative: Secondary | ICD-10-CM | POA: Diagnosis not present

## 2016-05-02 DIAGNOSIS — Z3042 Encounter for surveillance of injectable contraceptive: Secondary | ICD-10-CM

## 2016-05-02 LAB — POCT URINE PREGNANCY: Preg Test, Ur: NEGATIVE

## 2016-05-02 MED ORDER — MEDROXYPROGESTERONE ACETATE 150 MG/ML IM SUSP
150.0000 mg | Freq: Once | INTRAMUSCULAR | Status: AC
  Administered 2016-05-02: 150 mg via INTRAMUSCULAR

## 2016-05-02 NOTE — Progress Notes (Signed)
Pt here for Depo. Pt tolerated shot well. Return in 12 weeks for next shot. JSY 

## 2016-05-18 ENCOUNTER — Ambulatory Visit (INDEPENDENT_AMBULATORY_CARE_PROVIDER_SITE_OTHER): Payer: Self-pay | Admitting: Internal Medicine

## 2016-06-14 ENCOUNTER — Encounter (INDEPENDENT_AMBULATORY_CARE_PROVIDER_SITE_OTHER): Payer: Self-pay | Admitting: Internal Medicine

## 2016-06-14 ENCOUNTER — Ambulatory Visit (INDEPENDENT_AMBULATORY_CARE_PROVIDER_SITE_OTHER): Payer: Medicaid Other | Admitting: Internal Medicine

## 2016-06-14 ENCOUNTER — Encounter (INDEPENDENT_AMBULATORY_CARE_PROVIDER_SITE_OTHER): Payer: Self-pay

## 2016-06-14 VITALS — BP 140/88 | HR 84 | Temp 99.1°F | Resp 18 | Ht 67.0 in | Wt 222.3 lb

## 2016-06-14 DIAGNOSIS — K649 Unspecified hemorrhoids: Secondary | ICD-10-CM | POA: Diagnosis not present

## 2016-06-14 DIAGNOSIS — K5909 Other constipation: Secondary | ICD-10-CM | POA: Diagnosis not present

## 2016-06-14 DIAGNOSIS — R1012 Left upper quadrant pain: Secondary | ICD-10-CM

## 2016-06-14 MED ORDER — PLECANATIDE 3 MG PO TABS: 3.0000 mg | ORAL_TABLET | ORAL | 5 refills | Status: DC

## 2016-06-14 MED ORDER — HYDROCORTISONE ACETATE 25 MG RE SUPP: 25.0000 mg | Freq: Every day | RECTAL | 0 refills | Status: DC

## 2016-06-14 NOTE — Patient Instructions (Addendum)
Fiber supplement 4 g by mouth daily at bedtime. Keep record as to frequency of bleeding episodes and call with progress report in 8 weeks. Can try OTC Lidoderm patch for left upper quadrant abdominal pain. Use it on as-needed basis.

## 2016-06-14 NOTE — Progress Notes (Signed)
Presenting complaint;  Follow-up for constipation. Patient complains of left upper quadrant abdominal pain and rectal bleeding.  Subjective:  Sharon Vazquez is 36 year old African-American female who is therefore scheduled visit. She was last seen on 11/30/2015. She states she has had best results with Trulance as well as her bowels are concerned. She needs a prescription and samples if available. She is not having any side effects with this medication. She complains of pain in left upper quadrant of her abdomen. She describes this to be dull pain. It does not radiate. Pain does not get better with bowel movement. She seemed to have more pain when she moves. No history of shingles in this area. She also complains of back pain now going into her right leg where she feels numb. She states she had back surgery on 04/16/2016 with fusion at L3 and L4. She had steroid shot into her back about 2 weeks ago. She says left leg numbness has resolved since she had surgery. She says heartburns well controlled with therapy. She quit cigarette smoking on 04/05/2016 with Chantix. She has gained 27 pounds since she has quit cigarette smoking. She also complains of rectal bleeding. This generally occurs with her bowel movements and is fresh blood. She has history of colonic polyps. Last colonoscopy was on 09/25/2015 revealing internal and external hemorrhoids and she also had single small polyp and it turned out be tubular adenoma. On her first colonoscopy of March 2014 she had 10 mm sessile serrated polyp removed from her cecum.    Current Medications: Outpatient Encounter Prescriptions as of 06/14/2016  Medication Sig  . albuterol (PROVENTIL HFA;VENTOLIN HFA) 108 (90 BASE) MCG/ACT inhaler Inhale 1-2 puffs into the lungs every 6 (six) hours as needed for wheezing.  Marland Kitchen ALPRAZolam (XANAX XR) 3 MG 24 hr tablet Take 3 mg by mouth 2 (two) times daily.  . beclomethasone (QVAR) 80 MCG/ACT inhaler Inhale 2 puffs into the  lungs 2 (two) times daily.  . cloNIDine (CATAPRES) 0.1 MG tablet Take 0.1 mg by mouth 2 (two) times daily.  . clotrimazole-betamethasone (LOTRISONE) cream Apply topically 2 (two) times daily. (Patient taking differently: Apply topically as needed. )  . cyclobenzaprine (FLEXERIL) 5 MG tablet take 1 tablet by mouth every 6 to 8 hours if needed  . diazepam (VALIUM) 5 MG tablet Take 5 mg by mouth every 6 (six) hours as needed for anxiety.  Marland Kitchen doxepin (SINEQUAN) 50 MG capsule Take 1 capsule (50 mg total) by mouth at bedtime. (Patient taking differently: Take 50 mg by mouth 2 (two) times daily. )  . DULoxetine (CYMBALTA) 60 MG capsule Take 60 mg by mouth 2 (two) times daily.  Marland Kitchen gemfibrozil (LOPID) 600 MG tablet Take 0.5 tablets (300 mg total) by mouth 2 (two) times daily before a meal. (Patient taking differently: Take 600 mg by mouth 2 (two) times daily before a meal. )  . HYDROcodone-acetaminophen (NORCO/VICODIN) 5-325 MG tablet Take 1-2 tablets by mouth every 6 (six) hours as needed for moderate pain.  Marland Kitchen lamoTRIgine (LAMICTAL) 25 MG tablet Take 1 tablet (25 mg total) by mouth daily. (Patient taking differently: Take 100 mg by mouth 2 (two) times daily. )  . medroxyPROGESTERone (DEPO-PROVERA) 150 MG/ML injection INJECT 1 VIAL INTRAMUSCULARLY EVERY 3 MONTHS IN OFFICE.  . megestrol (MEGACE) 40 MG tablet TAKE 3 TABLETS DAILY FOR 5 DAYS, 2 TABLETS DAILY FOR 5 DAYS, THEN1 TABLET DAILY UNTIL BLEEDING STOPS.  . mometasone (NASONEX) 50 MCG/ACT nasal spray Place 2 sprays into the nose daily  as needed.  Marland Kitchen NEXIUM 40 MG capsule TAKE 1 CAPSULE ONCE DAILY AT 12 NOON.  Marland Kitchen Olopatadine HCl (PATADAY) 0.2 % SOLN Place 1 drop into both eyes daily as needed (itchy eyes).  . pimecrolimus (ELIDEL) 1 % cream Apply topically as needed.   . propranolol (INDERAL) 10 MG tablet Take 10 mg by mouth 2 (two) times daily.  . triazolam (HALCION) 0.25 MG tablet Take 0.25 mg by mouth at bedtime as needed for sleep.  Marland Kitchen Urea (ALUVEA) 39 %  CREA Apply 1 application topically daily.  . [DISCONTINUED] oxyCODONE-acetaminophen (PERCOCET/ROXICET) 5-325 MG tablet Take 2 tablets by mouth every 6 (six) hours as needed for severe pain.   No facility-administered encounter medications on file as of 06/14/2016.      Objective: Blood pressure 140/88, pulse 84, temperature 99.1 F (37.3 C), temperature source Oral, resp. rate 18, height 5\' 7"  (1.702 m), weight 222 lb 4.8 oz (100.8 kg). Ration is alert and in no acute distress. Conjunctiva is pink. Sclera is nonicteric Oropharyngeal mucosa is normal. No neck masses or thyromegaly noted. Cardiac exam with regular rhythm normal S1 and S2. No murmur or gallop noted. Lungs are clear to auscultation. Abdomen is full. Bowel sounds are normal. On palpation abdomen is soft. She has tenderness below the left costal margin on superficial palpation. No organomegaly or masses noted. No LE edema or clubbing noted.   Assessment:  #1. Chronic constipation. She is doing much better with Trulance. Constipation is felt to be secondary to her medications. #2. Left upper quadrant abdominal pain appears to be wall pain. #3. Hematochezia secondary to hemorrhoids.   Plan:  Patient given two-week supply of Trulance along with a prescription. Patient advised to take this medication daily or at least every other day and not on when necessary basis. Fiber supplement 4 g by mouth daily at bedtime. Anusol HC suppository 1 per rectum daily at bedtime for 2 weeks. He would use OTC Lidoderm patch 12 hours on 12 hours off and if it helps she can use it daily or on as-needed basis. Patient will keep record as to frequency of bleeding episodes over the next 8 weeks and call office at which time we'll determine if she needs to be referred for further therapy of hemorrhoids. Office visit in 6 months.

## 2016-06-16 ENCOUNTER — Ambulatory Visit (INDEPENDENT_AMBULATORY_CARE_PROVIDER_SITE_OTHER): Payer: Self-pay | Admitting: Internal Medicine

## 2016-06-29 ENCOUNTER — Ambulatory Visit (HOSPITAL_COMMUNITY): Payer: Medicaid Other | Attending: Orthopedic Surgery | Admitting: Physical Therapy

## 2016-06-29 ENCOUNTER — Encounter (HOSPITAL_COMMUNITY): Payer: Self-pay | Admitting: Physical Therapy

## 2016-06-29 DIAGNOSIS — R29898 Other symptoms and signs involving the musculoskeletal system: Secondary | ICD-10-CM | POA: Insufficient documentation

## 2016-06-29 DIAGNOSIS — R2689 Other abnormalities of gait and mobility: Secondary | ICD-10-CM | POA: Insufficient documentation

## 2016-06-29 DIAGNOSIS — M6281 Muscle weakness (generalized): Secondary | ICD-10-CM

## 2016-06-29 DIAGNOSIS — M545 Low back pain, unspecified: Secondary | ICD-10-CM

## 2016-06-29 DIAGNOSIS — G8929 Other chronic pain: Secondary | ICD-10-CM

## 2016-06-29 NOTE — Patient Instructions (Signed)
   Deep Diaphragmatic Relaxed Belly Breathing  Lie down on your back in a comfortable position. Take a deep breath in and allow your belly to rise up towards the ceiling (place a hand on your belly if this is helpful). Exhale and allow your belly to naturally fall back down. Repeat several times as you allow your body and mind to settle into a relaxed state.   Repeat 10 times, 3 times a day or as needed for stress/anxiety.    Transverse Abdominus Activation  Lying on your back, pull your bellybutton into your spine. It should feel like you are tensing the muscles of your stomach.  Repeat 20 times, 5 times per day in any position that's comfortable.

## 2016-06-29 NOTE — Therapy (Addendum)
[facility] [address] [place], [place], [address] Phone: [phone]   Fax:  [phone]  Physical Therapy Evaluation  Addendum 07/05/17:  PHYSICAL THERAPY DISCHARGE SUMMARY  Visits from Start of Care: 1  Current functional level related to goals / functional outcomes: See below   Remaining deficits: See below   Education / Equipment: n/a Plan: Patient agrees to discharge.  Patient goals were not met. Patient is being discharged due to not returning since the last visit.  ?????     Geraldine Solar PT, DPT  Patient Details  Name: Sharon Vazquez MRN: [id number] Date of Birth: 1981-03-06 Referring Provider: Phylliss Bob   Encounter Date: 06/29/2016      PT End of Session - 06/29/16 1125    Visit Number 1   Number of Visits 4  may update if she actually will be covered by 100% plan    Date for PT Re-Evaluation 07/27/16   Authorization Type Medicaid    Authorization Time Period 06/29/16 to 07/30/16   PT Start Time 1034   PT Stop Time 1112   PT Time Calculation (min) 38 min   Activity Tolerance Patient limited by pain   Behavior During Therapy [facility] for tasks assessed/performed      Past Medical History:  Diagnosis Date  . Abnormal Pap smear   . ADHD (attention deficit hyperactivity disorder)   . Anxiety   . Anxiety    takes Xanax daily as needed  . Arthritis    degenerative spine  . Asthma    Ventolin as needed and QVAR takes daily  . Back spasm    takes Flexeril daily as needed  . Bipolar 1 disorder ([place])   . Bipolar 1 disorder ([facility])    takes DOxepin daily  . Borderline personality disorder   . Chronic back pain    HNP  . Constipation    takes Dulcolax daily as needed takes Amitiza daily  . Contraceptive management 08/23/2013  . Eczema    has 2 creams uses as needed  . GERD (gastroesophageal reflux disease)    takes Tums as needed  . Headache(784.0)   . Headache(784.0)    migraines-last one about  35month ago;takes Topamax daily  . History of bronchitis    last time 4-521yrago  . History of colon polyps   . HSV-2 (herpes simplex virus 2) infection   . Hx of chlamydia infection   . Hypertension   . Hypertension    takes INderal and Clonidine daily  . IBS (irritable bowel syndrome)   . Insomnia    takes Ambien nightly  . Internal hemorrhoids   . Irregular periods 04/02/2014  . Joint swelling    right knee  . Mental disorder    takes Abilify as needed  . Obesity   . Panic attack   . Panic disorder   . Sciatica   . Shortness of breath    with exertion  . Urinary urgency   . Weakness    numbness and tingling to left foot    Past Surgical History:  Procedure Laterality Date  . BACK SURGERY    . bunion removal    . BUNIONECTOMY Left    pins in big toe and 2nd toe  . CHOLECYSTECTOMY  10 yrs ago  . CHOLECYSTECTOMY  10+yrs ago  . COLONOSCOPY N/A 08/22/2012   Procedure: COLONOSCOPY;  Surgeon: NaRogene HoustonMD;  [facility];  Service: Endoscopy;  Laterality: N/A;  100  . COLONOSCOPY    . COLONOSCOPY WITH PROPOFOL N/A 09/25/2015   Procedure: COLONOSCOPY WITH PROPOFOL;  Surgeon: Rogene Houston, MD;  [facility];  Service: Endoscopy;  Laterality: N/A;  2:30-moved to 7:30 Ann notified pt  . epidural injections     x 2  . LUMBAR FUSION  04/06/2016  . LUMBAR LAMINECTOMY/DECOMPRESSION MICRODISCECTOMY Left 07/03/2013   Procedure: LEFT LUMBAR THREE-FOUR microdiskectomy;  Surgeon: Winfield Cunas, MD;  Location: [facility];  Service: Neurosurgery;  Laterality: Left;  LEFT LUMBAR THREE-FOUR microdiskectomy  . LUMBAR LAMINECTOMY/DECOMPRESSION MICRODISCECTOMY Left 11/29/2013   Procedure: LEFT Lumbar Four-Five Redo microdiskectomy;  Surgeon: Winfield Cunas, MD;  Location: [place] [facility];  Service: Neurosurgery;  Laterality: Left;  LEFT Lumbar Four-Five Redo microdiskectomy  . POLYPECTOMY  09/25/2015   Procedure: POLYPECTOMY;  Surgeon: Rogene Houston, MD;   [facility];  Service: Endoscopy;;  at cecum  . SPINAL CORD STIMULATOR INSERTION N/A 06/13/2014   Procedure: LUMBAR SPINAL CORD STIMULATOR INSERTION;  Surgeon: Ashok Pall, MD;  Location: [facility];  Service: Neurosurgery;  Laterality: N/A;  permanent spinal cord stimulator insertion  . SPINAL CORD STIMULATOR REMOVAL N/A 12/17/2014   Procedure: THORACIC SPINAL CORD STIMULATOR REMOVAL;  Surgeon: Ashok Pall, MD;  Location: [facility];  Service: Neurosurgery;  Laterality: N/A;  THORACIC SPINAL CORD STIMULATOR REMOVAL  . SPINAL CORD STIMULATOR TRIAL N/A 04/23/2014   Procedure: LUMBAR SPINAL CORD STIMULATOR TRIAL;  Surgeon: Ashok Pall, MD;  Location: [place];  Service: Neurosurgery;  Laterality: N/A;  Spinal Cord Stimulator Trial  . TONSILLECTOMY    . TONSILLECTOMY     as a child  . wisdom teeth extracted    . WISDOM TOOTH EXTRACTION      There were no vitals filed for this visit.       Subjective Assessment - 06/29/16 1039    Subjective Patient was experiencing radicular pain going into her leg for quite some time; she had a fusion surgery on her low back in November of 2017, her pain is improved and is staying mostly in her back/no radicular symptoms at this point.  No numbness or tingling at this point. It is difficult for her to perform PLOF based tasks at this point; walking is difficult, and she reports she is more fatigued than before. She can stand maybe 10 minutes to wash dishes before she has to stop due to severe pain. She is out of the brace unless she feels she needs it due to pain; she was in the brace since surgery and just took it off on Monday. Her balance is off, she has had a couple of falls before and after surgery; she is not sure what causes them besides general LOB. She reports relatively low pain tolerance.    Pertinent History bipolar disorder, ADHD, panic disorder, history of ruptured disc, HSV-2, anxiety, IBS, borderline personality, history of  multiple lumbar surgeries (most recent was fusion in 11/17).    How long can you sit comfortably? 10-15 minutes    How long can you stand comfortably? 10 minutes    How long can you walk comfortably? immediate difficulty    Patient Stated Goals reduce pain as much as possible, build muscle strength, develop coping strategies for pain    Currently in Pain? Yes   Pain Score 7    Pain Location Back   Pain Orientation Lower;Medial   Pain Descriptors / Indicators Spasm;Dull;Aching;Constant   Pain Type Chronic  pain   Pain Radiating Towards none    Pain Onset More than a month ago   Pain Frequency Constant   Aggravating Factors  extended activities, walking, sleeping in wrong position/too long    Pain Relieving Factors not sure    Effect of Pain on Daily Activities severe limitation             [facility] PT Assessment - 06/29/16 0001      Assessment   Medical Diagnosis s/p back fusion    Referring Provider Phylliss Bob    Onset Date/Surgical Date 04/06/16   Next MD Visit Dr. Lynann Bologna in 8 weeks    Prior Therapy PT after her first back surgery      Precautions   Precaution Comments precautions/restrictions limited     Balance Screen   Has the patient fallen in the past 6 months Yes   How many times? 4  at least    Has the patient had a decrease in activity level because of a fear of falling?  Yes   Is the patient reluctant to leave their home because of a fear of falling?  No     Prior Function   Level of Independence Independent;Independent with basic ADLs;Independent with gait;Independent with transfers   Vocation Unemployed     Strength   Right Hip Flexion 3/5   Right Hip Extension 3/5   Right Hip ABduction 4+/5   Left Hip Flexion 4-/5   Left Hip Extension 3-/5   Left Hip ABduction 4+/5   Right Knee Flexion 5/5   Right Knee Extension 5/5   Left Knee Flexion 4/5   Left Knee Extension 4+/5   Right Ankle Dorsiflexion 5/5   Left Ankle Dorsiflexion 5/5     Palpation    Palpation comment knotting noted around lumbar paraspinals      Transfers   Five time sit to stand comments  18.24 UEs on thighs      High Level Balance   High Level Balance Comments TUG 7.97 no device                            PT Education - 06/29/16 1124    Education provided Yes   Education Details prognosis, POC, HEP; relation of stress/anxiety to back pain/function with back pain   Person(s) Educated Patient   Methods Explanation;Demonstration;Handout   Comprehension Verbalized understanding;Returned demonstration;Need further instruction          PT Short Term Goals - 06/29/16 1130      PT SHORT TERM GOAL #1   Title Patient to experience pain no more than 5/10 with standing activities of at least 20 minutes in order to improve QOL and functional task performance    Time 2   Period Weeks   Status New     PT SHORT TERM GOAL #2   Title Patient to be independent in correctly and consistently applying relaxation techniques such as diaphragmatic breathing and meditation strategies in order to assist in pain reduction    Time 2   Period Weeks   Status New     PT SHORT TERM GOAL #3   Title Patient to demonstrate correct functional mechanics for getting in/out of bed and lifting items from floor on 5/5 attempts in order to protect surgical area and assist in pain reduction    Time 2   Period Weeks   Status New     PT SHORT TERM GOAL #  4   Title Patient to be independent in correctly and consistently performing appropriate HEP, to be updated each session    Time 1   Period Weeks   Status New           PT Long Term Goals - 06/29/16 1137      PT LONG TERM GOAL #1   Title Patient to experience pain no more than 3/10 during standing tasks of at least 25 minutes in duration in order to improve QOL and functional task performance    Time 4   Period Weeks   Status New     PT LONG TERM GOAL #2   Title Patient to demonstrate functional strength as  being 5/5 in all tested groups in order to reduce pain and improve mechanics    Time 4   Period Weeks   Status New     PT LONG TERM GOAL #3   Title Patient to be able to complete 5x sit to stand in 13 seconds or less with no UEs in order to show improved functional mobilit and strength    Time 4   Period Weeks   Status New     PT LONG TERM GOAL #4   Title Patient to be independent in performing regular exercise program, at least 15 minutes in duration, at least 4 days per week, in order to maintain functional gains and assist in modulating pain    Time 4   Period Weeks   Status New               Plan - 06/29/16 1126    Clinical Impression Statement Patient arrives status post spinal fusion that she reports was done in November of 2017; per the PA of her surgeon, she is allowed to come out of brace and all restrictions have been lifted at this point. Patient became very emotional during evaluation, reporting that she is going through a very hard time with multiple medical problems as well as social problems such as going through a divorce and being unemployed right now. Examination reveals poor core strength, difficulty with functional mobility, mild functional weakness, postural deficits, unsteadiness, and reduced tolerance to functional activities secondary to pain. Discussed relation of stress/anxiety to back pain in particular and reviewed relaxation/core activation HEP with patient today. Patient reports that she was told she has 100% coverage until mid-February; as of this point I am having front desk look into this, until told otherwise we will assume we only have Medicaid coverage. Recommend skilled PT services to address functional limitations and reduce pain moving forward.    Rehab Potential Fair   Clinical Impairments Affecting Rehab Potential (+) appears motivated to participate in skilled PT; (-) complicated psychosocial factors, low pain tolerance, insurance limitations,  history of multiple lumbar surgeries    PT Frequency Other (comment)  3 total visits    PT Duration 4 weeks   PT Treatment/Interventions ADLs/Self Care Home Management;Biofeedback;Cryotherapy;Moist Heat;Electrical Stimulation;DME Instruction;Gait training;Stair training;Functional mobility training;Therapeutic activities;Therapeutic exercise;Balance training;Neuromuscular re-education;Patient/family education;Manual techniques;Passive range of motion;Energy conservation;Taping   PT Next Visit Plan review initial eval and goals; focus on pain reduction, postural and bed mobility training, core activation, manual as tolerated. Updated HEP each session.    PT Home Exercise Plan Eval: diagphramatic breathing, TA sets    Consulted and Agree with Plan of Care Patient      Patient will benefit from skilled therapeutic intervention in order to improve the following deficits and impairments:  Improper body  mechanics, Pain, Decreased coordination, Decreased mobility, Increased muscle spasms, Postural dysfunction, Decreased activity tolerance, Decreased strength, Difficulty walking, Decreased safety awareness  Visit Diagnosis: Chronic midline low back pain without sciatica - Plan: PT plan of care cert/re-cert  Other abnormalities of gait and mobility - Plan: PT plan of care cert/re-cert  Other symptoms and signs involving the musculoskeletal system - Plan: PT plan of care cert/re-cert  Muscle weakness (generalized) - Plan: PT plan of care cert/re-cert     Problem List Patient Active Problem List   Diagnosis Date Noted  . Radiculopathy 04/06/2016  . Bipolar 2 disorder, major depressive episode ([place]) 12/05/2015  . Intractable pain 12/17/2014  . Mass of breast, right 08/26/2014  . Chronic pain 06/13/2014  . Lumbar radiculopathy 04/23/2014  . Irregular periods 04/02/2014  . Displacement of lumbar intervertebral disc without myelopathy 11/01/2013  . Contraceptive management 08/23/2013  .  Superficial fungus infection of skin 08/23/2013  . HNP (herniated nucleus pulposus), lumbar 07/03/2013  . Right ovarian cyst 01/22/2013  . Chronic constipation 12/17/2012  . 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  . Unspecified essential hypertension 08/07/2012  . Unspecified constipation 08/07/2012  . Bipolar disorder, unspecified 08/07/2012  . Attention deficit hyperactivity disorder 08/07/2012  . Panic disorder 08/07/2012  . Rectal bleeding 08/07/2012  . Abdominal pain, right upper quadrant 08/07/2012    Deniece Ree PT, DPT [facility] [address] [place], [place], [address] Phone: [phone]   Fax:  [phone]  Name: Sharon Vazquez MRN: [id number] Date of Birth: Jul 07, 1980

## 2016-07-07 ENCOUNTER — Telehealth (HOSPITAL_COMMUNITY): Payer: Self-pay | Admitting: Internal Medicine

## 2016-07-07 ENCOUNTER — Ambulatory Visit (HOSPITAL_COMMUNITY): Payer: Medicaid Other | Admitting: Physical Therapy

## 2016-07-07 NOTE — Telephone Encounter (Signed)
07/07/16  Left a message to cx said that she couldn't be here today

## 2016-07-10 ENCOUNTER — Encounter (HOSPITAL_COMMUNITY): Payer: Self-pay | Admitting: Emergency Medicine

## 2016-07-10 ENCOUNTER — Emergency Department (HOSPITAL_COMMUNITY)
Admission: EM | Admit: 2016-07-10 | Discharge: 2016-07-10 | Disposition: A | Discharge status: Home / Self Care | Payer: Medicaid Other | Attending: Emergency Medicine | Admitting: Emergency Medicine

## 2016-07-10 DIAGNOSIS — K0889 Other specified disorders of teeth and supporting structures: Secondary | ICD-10-CM

## 2016-07-10 DIAGNOSIS — I10 Essential (primary) hypertension: Secondary | ICD-10-CM | POA: Diagnosis not present

## 2016-07-10 DIAGNOSIS — Z79899 Other long term (current) drug therapy: Secondary | ICD-10-CM | POA: Insufficient documentation

## 2016-07-10 DIAGNOSIS — J45909 Unspecified asthma, uncomplicated: Secondary | ICD-10-CM | POA: Diagnosis not present

## 2016-07-10 DIAGNOSIS — Z79891 Long term (current) use of opiate analgesic: Secondary | ICD-10-CM | POA: Diagnosis not present

## 2016-07-10 DIAGNOSIS — F909 Attention-deficit hyperactivity disorder, unspecified type: Secondary | ICD-10-CM | POA: Insufficient documentation

## 2016-07-10 MED ORDER — HYDROXYZINE HCL 25 MG PO TABS
25.0000 mg | ORAL_TABLET | Freq: Once | ORAL | Status: AC
  Administered 2016-07-10: 25 mg via ORAL
  Filled 2016-07-10: qty 1

## 2016-07-10 MED ORDER — IBUPROFEN 600 MG PO TABS: 600.0000 mg | ORAL_TABLET | Freq: Four times a day (QID) | ORAL | 0 refills | Status: DC

## 2016-07-10 MED ORDER — TRAMADOL HCL 50 MG PO TABS: 50.0000 mg | ORAL_TABLET | Freq: Four times a day (QID) | ORAL | 0 refills | Status: DC | PRN

## 2016-07-10 MED ORDER — AMOXICILLIN 250 MG PO CAPS
500.0000 mg | ORAL_CAPSULE | Freq: Once | ORAL | Status: AC
  Administered 2016-07-10: 500 mg via ORAL
  Filled 2016-07-10: qty 2

## 2016-07-10 MED ORDER — IBUPROFEN 800 MG PO TABS
800.0000 mg | ORAL_TABLET | Freq: Once | ORAL | Status: AC
  Administered 2016-07-10: 800 mg via ORAL
  Filled 2016-07-10: qty 1

## 2016-07-10 MED ORDER — TRAMADOL HCL 50 MG PO TABS
100.0000 mg | ORAL_TABLET | Freq: Once | ORAL | Status: AC
  Administered 2016-07-10: 100 mg via ORAL
  Filled 2016-07-10: qty 2

## 2016-07-10 MED ORDER — AMOXICILLIN 500 MG PO CAPS: 500.0000 mg | ORAL_CAPSULE | Freq: Three times a day (TID) | ORAL | 0 refills | Status: DC

## 2016-07-10 MED ORDER — HYDROXYZINE PAMOATE 25 MG PO CAPS: 25.0000 mg | ORAL_CAPSULE | Freq: Four times a day (QID) | ORAL | 0 refills | Status: DC | PRN

## 2016-07-10 MED ORDER — FLUCONAZOLE 100 MG PO TABS
200.0000 mg | ORAL_TABLET | Freq: Once | ORAL | Status: AC
  Administered 2016-07-10: 200 mg via ORAL
  Filled 2016-07-10: qty 2

## 2016-07-10 NOTE — ED Triage Notes (Signed)
Patient c/o left upper dental pain that started yesterday. Per patient took tylenol 3, flexeril, and valium with no relief.

## 2016-07-10 NOTE — ED Notes (Signed)
Pt's friend is here to give her a ride home.

## 2016-07-10 NOTE — ED Notes (Signed)
ED Provider at bedside. 

## 2016-07-10 NOTE — ED Provider Notes (Signed)
[facility] DEPT Provider Note   CSN: [id number] Arrival date & time: 07/10/16  1802  By signing my name below, I, Collene Leyden, attest that this documentation has been prepared under the direction and in the presence of Lily Kocher PA-C Electronically Signed: Collene Leyden, Scribe. 07/10/16. 6:47 PM.   History   Chief Complaint Chief Complaint  Patient presents with  . Dental Pain   HPI Comments: Sharon Vazquez is a 36 y.o. female with a hx of bipolar, chronic back pain, HTN, HLD, and anxiety, who presents to the Emergency Department complaining of left upper dental pain that began yesterday evening. Patient has associated facial swelling and dizziness. Patient describes the pain as sharp. Patient states she is unable to sleep. Patient reports taking tylenol, flexeril, and valium with no improvement in pain. Patient denies any shortness of breath, fever   The history is provided by the patient. No language interpreter was used.    Past Medical History:  Diagnosis Date  . Abnormal Pap smear   . ADHD (attention deficit hyperactivity disorder)   . Anxiety   . Anxiety    takes Xanax daily as needed  . Arthritis    degenerative spine  . Asthma    Ventolin as needed and QVAR takes daily  . Back spasm    takes Flexeril daily as needed  . Bipolar 1 disorder ([place])   . Bipolar 1 disorder ([facility])    takes DOxepin daily  . Borderline personality disorder   . Chronic back pain    HNP  . Constipation    takes Dulcolax daily as needed takes Amitiza daily  . Contraceptive management 08/23/2013  . Eczema    has 2 creams uses as needed  . GERD (gastroesophageal reflux disease)    takes Tums as needed  . Headache(784.0)   . Headache(784.0)    migraines-last one about 38months ago;takes Topamax daily  . History of bronchitis    last time 4-37yrs ago  . History of colon polyps   . HSV-2 (herpes simplex virus 2) infection   . Hx of chlamydia infection   . Hypertension   .  Hypertension    takes INderal and Clonidine daily  . IBS (irritable bowel syndrome)   . Insomnia    takes Ambien nightly  . Internal hemorrhoids   . Irregular periods 04/02/2014  . Joint swelling    right knee  . Mental disorder    takes Abilify as needed  . Obesity   . Panic attack   . Panic disorder   . Sciatica   . Shortness of breath    with exertion  . Urinary urgency   . Weakness    numbness and tingling to left foot    Patient Active Problem List   Diagnosis Date Noted  . Radiculopathy 04/06/2016  . Bipolar 2 disorder, major depressive episode ([place]) 12/05/2015  . Intractable pain 12/17/2014  . Mass of breast, right 08/26/2014  . Chronic pain 06/13/2014  . Lumbar radiculopathy 04/23/2014  . Irregular periods 04/02/2014  . Displacement of lumbar intervertebral disc without myelopathy 11/01/2013  . Contraceptive management 08/23/2013  . Superficial fungus infection of skin 08/23/2013  . HNP (herniated nucleus pulposus), lumbar 07/03/2013  . Right ovarian cyst 01/22/2013  . Chronic constipation 12/17/2012  . 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  . Unspecified essential hypertension 08/07/2012  . Unspecified constipation 08/07/2012  . Bipolar disorder, unspecified 08/07/2012  . Attention deficit hyperactivity disorder 08/07/2012  . Panic disorder  08/07/2012  . Rectal bleeding 08/07/2012  . Abdominal pain, right upper quadrant 08/07/2012    Past Surgical History:  Procedure Laterality Date  . BACK SURGERY    . bunion removal    . BUNIONECTOMY Left    pins in big toe and 2nd toe  . CHOLECYSTECTOMY  10 yrs ago  . CHOLECYSTECTOMY  10+yrs ago  . COLONOSCOPY N/A 08/22/2012   Procedure: COLONOSCOPY;  Surgeon: Rogene Houston, MD;  [facility];  Service: Endoscopy;  Laterality: N/A;  100  . COLONOSCOPY    . COLONOSCOPY WITH PROPOFOL N/A 09/25/2015   Procedure: COLONOSCOPY WITH PROPOFOL;  Surgeon: Rogene Houston, MD;  Location: [facility];  Service: Endoscopy;  Laterality: N/A;  2:30-moved to 7:30 Ann notified pt  . epidural injections     x 2  . LUMBAR FUSION  04/06/2016  . LUMBAR LAMINECTOMY/DECOMPRESSION MICRODISCECTOMY Left 07/03/2013   Procedure: LEFT LUMBAR THREE-FOUR microdiskectomy;  Surgeon: Winfield Cunas, MD;  Location: [place] [facility];  Service: Neurosurgery;  Laterality: Left;  LEFT LUMBAR THREE-FOUR microdiskectomy  . LUMBAR LAMINECTOMY/DECOMPRESSION MICRODISCECTOMY Left 11/29/2013   Procedure: LEFT Lumbar Four-Five Redo microdiskectomy;  Surgeon: Winfield Cunas, MD;  Location: [place] [facility];  Service: Neurosurgery;  Laterality: Left;  LEFT Lumbar Four-Five Redo microdiskectomy  . POLYPECTOMY  09/25/2015   Procedure: POLYPECTOMY;  Surgeon: Rogene Houston, MD;  [facility];  Service: Endoscopy;;  at cecum  . SPINAL CORD STIMULATOR INSERTION N/A 06/13/2014   Procedure: LUMBAR SPINAL CORD STIMULATOR INSERTION;  Surgeon: Ashok Pall, MD;  Location: [place] [facility];  Service: Neurosurgery;  Laterality: N/A;  permanent spinal cord stimulator insertion  . SPINAL CORD STIMULATOR REMOVAL N/A 12/17/2014   Procedure: THORACIC SPINAL CORD STIMULATOR REMOVAL;  Surgeon: Ashok Pall, MD;  Location: [place] [facility] OR[facility];  Service: Neurosurgery;  Laterality: N/A;  THORACIC SPINAL CORD STIMULATOR REMOVAL  . SPINAL CORD STIMULATOR TRIAL N/A 04/23/2014   Procedure: LUMBAR SPINAL CORD STIMULATOR TRIAL;  Surgeon: Ashok Pall, MD;  Location: [place] [facility] OR[facility];  Service: Neurosurgery;  Laterality: N/A;  Spinal Cord Stimulator Trial  . TONSILLECTOMY    . TONSILLECTOMY     as a child  . wisdom teeth extracted    . WISDOM TOOTH EXTRACTION      OB History    Gravida Para Term Preterm AB Living   0             SAB TAB Ectopic Multiple Live Births                   Home Medications    Prior to Admission medications   Medication Sig Start Date End Date Taking? Authorizing Provider  albuterol (PROVENTIL HFA;VENTOLIN HFA) 108 (90 BASE)  MCG/ACT inhaler Inhale 1-2 puffs into the lungs every 6 (six) hours as needed for wheezing. 04/23/15   Roselyn Malachy Moan, MD  ALPRAZolam (XANAX XR) 3 MG 24 hr tablet Take 3 mg by mouth 2 (two) times daily.    Historical Provider, MD  beclomethasone (QVAR) 80 MCG/ACT inhaler Inhale 2 puffs into the lungs 2 (two) times daily.    Historical Provider, MD  cloNIDine (CATAPRES) 0.1 MG tablet Take 0.1 mg by mouth 2 (two) times daily.    Historical Provider, MD  clotrimazole-betamethasone (LOTRISONE) cream Apply topically 2 (two) times daily. Patient taking differently: Apply topically as needed.  12/07/15   Derrill Center, NP  cyclobenzaprine (FLEXERIL) 5 MG tablet take 1 tablet by mouth  every 6 to 8 hours if needed 03/02/16   Historical Provider, MD  diazepam (VALIUM) 5 MG tablet Take 5 mg by mouth every 6 (six) hours as needed for anxiety.    Historical Provider, MD  doxepin (SINEQUAN) 50 MG capsule Take 1 capsule (50 mg total) by mouth at bedtime. Patient taking differently: Take 50 mg by mouth 2 (two) times daily.  12/07/15   Derrill Center, NP  DULoxetine (CYMBALTA) 60 MG capsule Take 60 mg by mouth 2 (two) times daily.    Historical Provider, MD  gemfibrozil (LOPID) 600 MG tablet Take 0.5 tablets (300 mg total) by mouth 2 (two) times daily before a meal. Patient taking differently: Take 600 mg by mouth 2 (two) times daily before a meal.  12/07/15   Derrill Center, NP  HYDROcodone-acetaminophen (NORCO/VICODIN) 5-325 MG tablet Take 1-2 tablets by mouth every 6 (six) hours as needed for moderate pain.    Historical Provider, MD  hydrocortisone (ANUSOL-HC) 25 MG suppository Place 1 suppository (25 mg total) rectally at bedtime. 06/14/16   Rogene Houston, MD  lamoTRIgine (LAMICTAL) 25 MG tablet Take 1 tablet (25 mg total) by mouth daily. Patient taking differently: Take 100 mg by mouth 2 (two) times daily.  12/07/15   Derrill Center, NP  medroxyPROGESTERone (DEPO-PROVERA) 150 MG/ML injection INJECT 1 VIAL  INTRAMUSCULARLY EVERY 3 MONTHS IN OFFICE. 08/04/15   Estill Dooms, NP  megestrol (MEGACE) 40 MG tablet TAKE 3 TABLETS DAILY FOR 5 DAYS, 2 TABLETS DAILY FOR 5 DAYS, THEN1 TABLET DAILY UNTIL BLEEDING STOPS. 03/17/16   Roma Schanz, CNM  mometasone (NASONEX) 50 MCG/ACT nasal spray Place 2 sprays into the nose daily as needed.    Historical Provider, MD  NEXIUM 40 MG capsule TAKE 1 CAPSULE ONCE DAILY AT 12 NOON. 04/19/16   Butch Penny, NP  Olopatadine HCl (PATADAY) 0.2 % SOLN Place 1 drop into both eyes daily as needed (itchy eyes).    Historical Provider, MD  pimecrolimus (ELIDEL) 1 % cream Apply topically as needed.     Historical Provider, MD  Plecanatide (TRULANCE) 3 MG TABS Take 3 mg by mouth every morning. 06/14/16   Rogene Houston, MD  propranolol (INDERAL) 10 MG tablet Take 10 mg by mouth 2 (two) times daily.    Historical Provider, MD  triazolam (HALCION) 0.25 MG tablet Take 0.25 mg by mouth at bedtime as needed for sleep.    Historical Provider, MD  Urea (ALUVEA) 39 % CREA Apply 1 application topically daily.    Historical Provider, MD    Family History Family History  Problem Relation Age of Onset  . Diabetes Mother   . Thyroid disease Mother   . Other Mother     PTSD  . Hyperlipidemia Mother   . Hypertension Maternal Aunt   . Diabetes Maternal Aunt   . Thyroid disease Maternal Aunt   . Diabetes Maternal Grandmother   . Heart disease Maternal Grandmother     CHF  . Hypertension Maternal Grandmother   . Dementia Maternal Grandmother   . Diabetes Father   . Hypertension Father   . Obesity Father   . Cancer Paternal Grandmother     breast  . Alcohol abuse Paternal Grandmother   . Crohn's disease Maternal Uncle   . Hyperlipidemia Maternal Uncle   . Other Maternal Uncle     back pain  . Dementia Maternal Uncle   . Diabetes Cousin   . Other Maternal Grandfather  lung transplant    Social History Social History  Substance Use Topics  . Smoking status:  Former Smoker    Packs/day: 0.00    Years: 9.00    Types: Cigarettes  . Smokeless tobacco: Never Used     Comment: stopped 04/05/16  . Alcohol use No     Allergies   Bee venom; Dilaudid [hydromorphone hcl]; Other; Peanut oil; Senna; Adhesive [tape]; and Latex   Review of Systems Review of Systems  HENT: Positive for dental problem.   Musculoskeletal: Positive for arthralgias and back pain.  Psychiatric/Behavioral: The patient is nervous/anxious.   All other systems reviewed and are negative.    Physical Exam Updated Vital Signs BP 117/76 (BP Location: Right Arm)   Pulse 70   Temp 97.5 F (36.4 C) (Oral)   Resp 18   Ht 5\' 7"  (1.702 m)   Wt 210 lb (95.3 kg)   SpO2 96%   BMI 32.89 kg/m   Physical Exam  Constitutional: She is oriented to person, place, and time. She appears well-developed.  HENT:  Head: Normocephalic and atraumatic.  Mouth/Throat: Oropharynx is clear and moist.  Face is symmetrical, there is no temperature change on the right versus left side. Swelling near the right upper molar. No visible abscess. Tenderness to palpation of the upper molar and premolar area. The airway is patient. No swelling under the tongue.   Eyes: Conjunctivae and EOM are normal. Pupils are equal, round, and reactive to light.  Neck: Normal range of motion. Neck supple.  No cervical lymph adenopathy.   Cardiovascular: Normal rate and regular rhythm.  Exam reveals no gallop and no friction rub.   Pulmonary/Chest: Effort normal.  Abdominal: Soft. Bowel sounds are normal.  Neurological: She is alert and oriented to person, place, and time.  Skin: Skin is warm and dry.  No rash on the palms.      ED Treatments / Results  DIAGNOSTIC STUDIES: Oxygen Saturation is 96% on RA, adequate by my interpretation.    COORDINATION OF CARE: 6:45 PM Discussed treatment plan with pt at bedside and pt agreed to plan, which includes antibiotics.   Labs (all labs ordered are listed, but only  abnormal results are displayed) Labs Reviewed - No data to display  EKG  EKG Interpretation None       Radiology No results found.  Procedures Procedures (including critical care time)  Medications Ordered in ED Medications - No data to display   Initial Impression / Assessment and Plan / ED Course  I have reviewed the triage vital signs and the nursing notes.  Pertinent labs & imaging results that were available during my care of the patient were reviewed by me and considered in my medical decision making (see chart for details).     *I have reviewed nursing notes, vital signs, and all appropriate lab and imaging results for this patient.**  Final Clinical Impressions(s) / ED Diagnoses   MDM: Tenderness to palpation of the right upper molar area. No visible abscess. Airway is patent. No swelling under the tongue. Doubt ludwig's angina. Plan will be amoxil, ibuprofen, and ultram. She will see a dentist as soon as possible.   Final diagnoses:  Pain, dental    New Prescriptions New Prescriptions   No medications on file   **I personally performed the services described in this documentation, which was scribed in my presence. The recorded information has been reviewed and is accurate.Lily Kocher, PA-C 07/13/16 1055  Forde Dandy, MD 07/13/16 1115

## 2016-07-10 NOTE — Discharge Instructions (Signed)
Your vital signs are within normal limits. It is important that you see a dentist as soon as possible. Use amoxil,ibuprofen, and ultram daily. Use vistaril for nausea if needed. Vistaril and ultram may cause drowsiness, use with caution.

## 2016-07-21 ENCOUNTER — Ambulatory Visit (HOSPITAL_COMMUNITY): Payer: Medicaid Other | Attending: Orthopedic Surgery | Admitting: Physical Therapy

## 2016-07-21 ENCOUNTER — Telehealth (HOSPITAL_COMMUNITY): Payer: Self-pay

## 2016-07-21 NOTE — Telephone Encounter (Signed)
No-show #1. Spoke with pt directly. Called pt regarding missed appointment this morning. She stated "I forgot I had one. I've been in the bed sick for 2 days. I don't have anymore appointments, they won't approve me." PT educated pt that she has been approved for 3 Medicaid visits through 08/14/16. Pt stated that she was currently trying to sleep and that she would call back in 1 hour to schedule those 3 appointments.  Geraldine Solar PT, DPT

## 2016-07-26 ENCOUNTER — Telehealth (HOSPITAL_COMMUNITY): Payer: Self-pay | Admitting: Physical Therapy

## 2016-07-26 NOTE — Telephone Encounter (Signed)
°   Explained to pt that she had an apptment on 07/07/16 and 07/21/16 with no shows. Called pt she will come by tomorrow with letter from [facility] that states she have more visit approved  NF

## 2016-07-26 NOTE — Telephone Encounter (Signed)
Called pt she will come by tomorrow with letter from [facility] that states she have more visit approved. NF

## 2016-07-29 ENCOUNTER — Encounter (INDEPENDENT_AMBULATORY_CARE_PROVIDER_SITE_OTHER): Payer: Self-pay

## 2016-07-29 ENCOUNTER — Ambulatory Visit (INDEPENDENT_AMBULATORY_CARE_PROVIDER_SITE_OTHER): Payer: Medicaid Other | Admitting: *Deleted

## 2016-07-29 DIAGNOSIS — Z3202 Encounter for pregnancy test, result negative: Secondary | ICD-10-CM

## 2016-07-29 DIAGNOSIS — Z3042 Encounter for surveillance of injectable contraceptive: Secondary | ICD-10-CM | POA: Diagnosis not present

## 2016-07-29 LAB — POCT URINE PREGNANCY: PREG TEST UR: NEGATIVE

## 2016-07-29 MED ORDER — MEDROXYPROGESTERONE ACETATE 150 MG/ML IM SUSP
150.0000 mg | Freq: Once | INTRAMUSCULAR | Status: AC
  Administered 2016-07-29: 150 mg via INTRAMUSCULAR

## 2016-07-29 NOTE — Progress Notes (Signed)
Pt here today for Depo injection, UPT resulted negative and pt denies any problems or concerns with the Depo other than having some hot flashes.  Advised pt to address the hot flashes at next OV with provider.  Injection given in Lt Deltoid and patient advised to return in 3 months for next injection.

## 2016-08-01 ENCOUNTER — Ambulatory Visit: Payer: Medicaid Other

## 2016-08-02 ENCOUNTER — Ambulatory Visit: Payer: Medicaid Other

## 2016-08-06 ENCOUNTER — Encounter (HOSPITAL_COMMUNITY): Payer: Self-pay | Admitting: *Deleted

## 2016-08-06 ENCOUNTER — Emergency Department (HOSPITAL_COMMUNITY)
Admission: EM | Admit: 2016-08-06 | Discharge: 2016-08-06 | Disposition: A | Discharge status: Home / Self Care | Payer: Medicaid Other | Attending: Emergency Medicine | Admitting: Emergency Medicine

## 2016-08-06 DIAGNOSIS — J069 Acute upper respiratory infection, unspecified: Secondary | ICD-10-CM | POA: Diagnosis not present

## 2016-08-06 DIAGNOSIS — I10 Essential (primary) hypertension: Secondary | ICD-10-CM | POA: Insufficient documentation

## 2016-08-06 DIAGNOSIS — Z87891 Personal history of nicotine dependence: Secondary | ICD-10-CM | POA: Diagnosis not present

## 2016-08-06 DIAGNOSIS — F909 Attention-deficit hyperactivity disorder, unspecified type: Secondary | ICD-10-CM | POA: Diagnosis not present

## 2016-08-06 DIAGNOSIS — Z791 Long term (current) use of non-steroidal anti-inflammatories (NSAID): Secondary | ICD-10-CM | POA: Insufficient documentation

## 2016-08-06 DIAGNOSIS — J45909 Unspecified asthma, uncomplicated: Secondary | ICD-10-CM | POA: Diagnosis not present

## 2016-08-06 DIAGNOSIS — R05 Cough: Secondary | ICD-10-CM | POA: Diagnosis present

## 2016-08-06 DIAGNOSIS — Z79899 Other long term (current) drug therapy: Secondary | ICD-10-CM | POA: Insufficient documentation

## 2016-08-06 NOTE — Discharge Instructions (Signed)
It was a pleasure to meet you today [name]. I think you have caught a viral bug which will be treated best with rest. Keep using DayQuil and NyQuil. Take Tylenol if you have a fever. For ear congestion, try using a Nettie pot. Make sure the water using the Nettie pot is clean by either boiling it and letting it cool before placing in the pot or use bottled water.

## 2016-08-06 NOTE — ED Triage Notes (Signed)
Pt comes in for cough and congestion starting 2 days ago. Pt states cough is productive. Denies any n/v/d.  In addition, pt is having lower back pain that is chronic in nature. Pt got a steroid injection for this on Tuesday.

## 2016-08-06 NOTE — ED Provider Notes (Signed)
[facility] DEPT Provider Note   CSN: [id number] Arrival date & time: 08/06/16  [id number]     History   Chief Complaint Chief Complaint  Patient presents with  . Cough  . Back Pain    HPI Sharon Vazquez is a 36 y.o. female.  36 year old female with past medical history asthma, hypertension, hyperlipidemia, IBS, bipolar, chronic back pain who presents with sinus pressure and congestion and productive cough. Cough is productive of small amount of clear phlegm. Symptoms began 3 days ago and are associated with left ear pain. She's tried taking DayQuil and NyQuil does have helped to relieve her symptoms somewhat. He denies myalgia, sore throat, fevers or chills, difficulty breathing or wheeze. He is not needed to use her albuterol inhaler more frequently. She has not been around any sick contacts. She received this years flu shot.    Cough  Associated symptoms include ear pain and rhinorrhea. Pertinent negatives include no chills, no sore throat, no myalgias, no shortness of breath and no wheezing.  Back Pain   Pertinent negatives include no fever.    Past Medical History:  Diagnosis Date  . Abnormal Pap smear   . ADHD (attention deficit hyperactivity disorder)   . Anxiety   . Anxiety    takes Xanax daily as needed  . Arthritis    degenerative spine  . Asthma    Ventolin as needed and QVAR takes daily  . Back spasm    takes Flexeril daily as needed  . Bipolar 1 disorder ([place])   . Bipolar 1 disorder ([facility])    takes DOxepin daily  . Borderline personality disorder   . Chronic back pain    HNP  . Constipation    takes Dulcolax daily as needed takes Amitiza daily  . Contraceptive management 08/23/2013  . Eczema    has 2 creams uses as needed  . GERD (gastroesophageal reflux disease)    takes Tums as needed  . Headache(784.0)   . Headache(784.0)    migraines-last one about 100months ago;takes Topamax daily  . History of bronchitis    last time 4-79yrs ago  . History of  colon polyps   . HSV-2 (herpes simplex virus 2) infection   . Hx of chlamydia infection   . Hypertension   . Hypertension    takes INderal and Clonidine daily  . IBS (irritable bowel syndrome)   . Insomnia    takes Ambien nightly  . Internal hemorrhoids   . Irregular periods 04/02/2014  . Joint swelling    right knee  . Mental disorder    takes Abilify as needed  . Obesity   . Panic attack   . Panic disorder   . Sciatica   . Shortness of breath    with exertion  . Urinary urgency   . Weakness    numbness and tingling to left foot    Patient Active Problem List   Diagnosis Date Noted  . Radiculopathy 04/06/2016  . Bipolar 2 disorder, major depressive episode ([place]) 12/05/2015  . Intractable pain 12/17/2014  . Mass of breast, right 08/26/2014  . Chronic pain 06/13/2014  . Lumbar radiculopathy 04/23/2014  . Irregular periods 04/02/2014  . Displacement of lumbar intervertebral disc without myelopathy 11/01/2013  . Contraceptive management 08/23/2013  . Superficial fungus infection of skin 08/23/2013  . HNP (herniated nucleus pulposus), lumbar 07/03/2013  . Right ovarian cyst 01/22/2013  . Chronic constipation 12/17/2012  . 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  . Unspecified  essential hypertension 08/07/2012  . Unspecified constipation 08/07/2012  . Bipolar disorder, unspecified 08/07/2012  . Attention deficit hyperactivity disorder 08/07/2012  . Panic disorder 08/07/2012  . Rectal bleeding 08/07/2012  . Abdominal pain, right upper quadrant 08/07/2012    Past Surgical History:  Procedure Laterality Date  . BACK SURGERY    . bunion removal    . BUNIONECTOMY Left    pins in big toe and 2nd toe  . CHOLECYSTECTOMY  10 yrs ago  . CHOLECYSTECTOMY  10+yrs ago  . COLONOSCOPY N/A 08/22/2012   Procedure: COLONOSCOPY;  Surgeon: Rogene Houston, MD;  Location: [facility];  Service: Endoscopy;  Laterality: N/A;  100  . COLONOSCOPY    . COLONOSCOPY WITH  PROPOFOL N/A 09/25/2015   Procedure: COLONOSCOPY WITH PROPOFOL;  Surgeon: Rogene Houston, MD;  [facility];  Service: Endoscopy;  Laterality: N/A;  2:30-moved to 7:30 Ann notified pt  . epidural injections     x 2  . LUMBAR FUSION  04/06/2016  . LUMBAR LAMINECTOMY/DECOMPRESSION MICRODISCECTOMY Left 07/03/2013   Procedure: LEFT LUMBAR THREE-FOUR microdiskectomy;  Surgeon: Winfield Cunas, MD;  Location: [facility];  Service: Neurosurgery;  Laterality: Left;  LEFT LUMBAR THREE-FOUR microdiskectomy  . LUMBAR LAMINECTOMY/DECOMPRESSION MICRODISCECTOMY Left 11/29/2013   Procedure: LEFT Lumbar Four-Five Redo microdiskectomy;  Surgeon: Winfield Cunas, MD;  Location: [place] [facility];  Service: Neurosurgery;  Laterality: Left;  LEFT Lumbar Four-Five Redo microdiskectomy  . POLYPECTOMY  09/25/2015   Procedure: POLYPECTOMY;  Surgeon: Rogene Houston, MD;  [facility];  Service: Endoscopy;;  at cecum  . SPINAL CORD STIMULATOR INSERTION N/A 06/13/2014   Procedure: LUMBAR SPINAL CORD STIMULATOR INSERTION;  Surgeon: Ashok Pall, MD;  Location: [facility];  Service: Neurosurgery;  Laterality: N/A;  permanent spinal cord stimulator insertion  . SPINAL CORD STIMULATOR REMOVAL N/A 12/17/2014   Procedure: THORACIC SPINAL CORD STIMULATOR REMOVAL;  Surgeon: Ashok Pall, MD;  Location: [place] [facility];  Service: Neurosurgery;  Laterality: N/A;  THORACIC SPINAL CORD STIMULATOR REMOVAL  . SPINAL CORD STIMULATOR TRIAL N/A 04/23/2014   Procedure: LUMBAR SPINAL CORD STIMULATOR TRIAL;  Surgeon: Ashok Pall, MD;  Location: [place] [facility];  Service: Neurosurgery;  Laterality: N/A;  Spinal Cord Stimulator Trial  . TONSILLECTOMY    . TONSILLECTOMY     as a child  . wisdom teeth extracted    . WISDOM TOOTH EXTRACTION      OB History    Gravida Para Term Preterm AB Living   0             SAB TAB Ectopic Multiple Live Births                   Home Medications    Prior to Admission medications     Medication Sig Start Date End Date Taking? Authorizing Provider  albuterol (PROVENTIL HFA;VENTOLIN HFA) 108 (90 BASE) MCG/ACT inhaler Inhale 1-2 puffs into the lungs every 6 (six) hours as needed for wheezing. 04/23/15   Roselyn Malachy Moan, MD  ALPRAZolam (XANAX XR) 3 MG 24 hr tablet Take 3 mg by mouth 2 (two) times daily.    Historical Provider, MD  amoxicillin (AMOXIL) 500 MG capsule Take 1 capsule (500 mg total) by mouth 3 (three) times daily. 07/10/16   Lily Kocher, PA-C  beclomethasone (QVAR) 80 MCG/ACT inhaler Inhale 2 puffs into the lungs 2 (two) times daily.    Historical Provider, MD  cloNIDine (CATAPRES) 0.1 MG tablet Take  0.1 mg by mouth 2 (two) times daily.    Historical Provider, MD  clotrimazole-betamethasone (LOTRISONE) cream Apply topically 2 (two) times daily. Patient taking differently: Apply topically as needed.  12/07/15   Derrill Center, NP  cyclobenzaprine (FLEXERIL) 5 MG tablet take 1 tablet by mouth every 6 to 8 hours if needed 03/02/16   Historical Provider, MD  diazepam (VALIUM) 5 MG tablet Take 5 mg by mouth every 6 (six) hours as needed for anxiety.    Historical Provider, MD  doxepin (SINEQUAN) 50 MG capsule Take 1 capsule (50 mg total) by mouth at bedtime. Patient taking differently: Take 50 mg by mouth 2 (two) times daily.  12/07/15   Derrill Center, NP  DULoxetine (CYMBALTA) 60 MG capsule Take 60 mg by mouth 2 (two) times daily.    Historical Provider, MD  gemfibrozil (LOPID) 600 MG tablet Take 0.5 tablets (300 mg total) by mouth 2 (two) times daily before a meal. Patient taking differently: Take 600 mg by mouth 2 (two) times daily before a meal.  12/07/15   Derrill Center, NP  HYDROcodone-acetaminophen (NORCO/VICODIN) 5-325 MG tablet Take 1-2 tablets by mouth every 6 (six) hours as needed for moderate pain.    Historical Provider, MD  hydrocortisone (ANUSOL-HC) 25 MG suppository Place 1 suppository (25 mg total) rectally at bedtime. 06/14/16   Rogene Houston, MD  hydrOXYzine  (VISTARIL) 25 MG capsule Take 1 capsule (25 mg total) by mouth every 6 (six) hours as needed for nausea or vomiting. 07/10/16   Lily Kocher, PA-C  ibuprofen (ADVIL,MOTRIN) 600 MG tablet Take 1 tablet (600 mg total) by mouth 4 (four) times daily. 07/10/16   Lily Kocher, PA-C  lamoTRIgine (LAMICTAL) 25 MG tablet Take 1 tablet (25 mg total) by mouth daily. Patient taking differently: Take 100 mg by mouth 2 (two) times daily.  12/07/15   Derrill Center, NP  medroxyPROGESTERone (DEPO-PROVERA) 150 MG/ML injection INJECT 1 VIAL INTRAMUSCULARLY EVERY 3 MONTHS IN OFFICE. 08/04/15   Estill Dooms, NP  megestrol (MEGACE) 40 MG tablet TAKE 3 TABLETS DAILY FOR 5 DAYS, 2 TABLETS DAILY FOR 5 DAYS, THEN1 TABLET DAILY UNTIL BLEEDING STOPS. 03/17/16   Roma Schanz, CNM  mometasone (NASONEX) 50 MCG/ACT nasal spray Place 2 sprays into the nose daily as needed.    Historical Provider, MD  NEXIUM 40 MG capsule TAKE 1 CAPSULE ONCE DAILY AT 12 NOON. 04/19/16   Butch Penny, NP  Olopatadine HCl (PATADAY) 0.2 % SOLN Place 1 drop into both eyes daily as needed (itchy eyes).    Historical Provider, MD  pimecrolimus (ELIDEL) 1 % cream Apply topically as needed.     Historical Provider, MD  Plecanatide (TRULANCE) 3 MG TABS Take 3 mg by mouth every morning. 06/14/16   Rogene Houston, MD  propranolol (INDERAL) 10 MG tablet Take 10 mg by mouth 2 (two) times daily.    Historical Provider, MD  traMADol (ULTRAM) 50 MG tablet Take 1 tablet (50 mg total) by mouth every 6 (six) hours as needed. 07/10/16   Lily Kocher, PA-C  triazolam (HALCION) 0.25 MG tablet Take 0.25 mg by mouth at bedtime as needed for sleep.    Historical Provider, MD  Urea (ALUVEA) 39 % CREA Apply 1 application topically daily.    Historical Provider, MD    Family History Family History  Problem Relation Age of Onset  . Diabetes Mother   . Thyroid disease Mother   . Other Mother  PTSD  . Hyperlipidemia Mother   . Hypertension Maternal Aunt   .  Diabetes Maternal Aunt   . Thyroid disease Maternal Aunt   . Diabetes Maternal Grandmother   . Heart disease Maternal Grandmother     CHF  . Hypertension Maternal Grandmother   . Dementia Maternal Grandmother   . Diabetes Father   . Hypertension Father   . Obesity Father   . Cancer Paternal Grandmother     breast  . Alcohol abuse Paternal Grandmother   . Crohn's disease Maternal Uncle   . Hyperlipidemia Maternal Uncle   . Other Maternal Uncle     back pain  . Dementia Maternal Uncle   . Diabetes Cousin   . Other Maternal Grandfather     lung transplant    Social History Social History  Substance Use Topics  . Smoking status: Former Smoker    Packs/day: 0.00    Years: 9.00    Types: Cigarettes  . Smokeless tobacco: Never Used     Comment: stopped 04/05/16  . Alcohol use No     Allergies   Bee venom; Dilaudid [hydromorphone hcl]; Other; Peanut oil; Senna; Adhesive [tape]; and Latex   Review of Systems Review of Systems  Constitutional: Negative for appetite change, chills and fever.  HENT: Positive for ear pain, rhinorrhea and sinus pressure. Negative for sore throat.   Respiratory: Positive for cough. Negative for chest tightness, shortness of breath and wheezing.   Musculoskeletal: Positive for back pain. Negative for myalgias.     Physical Exam Updated Vital Signs BP 132/84 (BP Location: Right Arm)   Pulse 112   Temp 99 F (37.2 C) (Oral)   Resp 18   Ht 5\' 7"  (1.702 m)   Wt 91.6 kg   SpO2 99%   BMI 31.64 kg/m   Physical Exam  Constitutional: She appears well-developed and well-nourished. No distress.  HENT:  Head: Normocephalic and atraumatic.  Left Ear: External ear normal.  Mouth/Throat: No oropharyngeal exudate.  Oropharyngeal erythema without exudate Left mastoid tenderness Left ear canal with palpitations or erythema. Left tympanic membrane is gray and translucent with cone of light there is no effusion Left frontal sinus tenderness     Eyes: Conjunctivae are normal. No scleral icterus.  Neck:  Left cervical LAD   Cardiovascular: Tachycardia present.   Skin: She is not diaphoretic.   ED Treatments / Results  Labs (all labs ordered are listed, but only abnormal results are displayed) Labs Reviewed - No data to display  EKG  EKG Interpretation None       Radiology No results found.  Procedures Procedures (including critical care time)  Medications Ordered in ED Medications - No data to display   Initial Impression / Assessment and Plan / ED Course  I have reviewed the triage vital signs and the nursing notes.  Pertinent labs & imaging results that were available during my care of the patient were reviewed by me and considered in my medical decision making (see chart for details).  Slight is a 36 year old woman with past medical history asthma, hyperlipidemia, hypertension, IBS and chronic back pain who presents with 3 days of sinus pressure and congestion with productive cough. She denies wheeze or shortness of breath. On exam she is afebrile with sinus tenderness, tender lymphadenopathy, mastoid tenderness and no signs of ear infection. I believe his presentation is most consistent with URI and sinusitis acutely due to viral infection. I do not think she needs antibiotics at this time.  Will recommend continued rest at home and conservative management with saline sinus irrigation.   Final Clinical Impressions(s) / ED Diagnoses   Final diagnoses:  None    New Prescriptions New Prescriptions   No medications on file     Ledell Noss, MD 08/06/16 1023    Elnora Morrison, MD 08/06/16 1515    Elnora Morrison, MD 08/15/16 1510

## 2016-08-26 ENCOUNTER — Encounter: Payer: Self-pay | Admitting: Allergy

## 2016-08-26 ENCOUNTER — Ambulatory Visit (INDEPENDENT_AMBULATORY_CARE_PROVIDER_SITE_OTHER): Payer: Medicaid Other | Admitting: Allergy

## 2016-08-26 VITALS — BP 132/80 | HR 102 | Temp 99.3°F | Resp 16 | Ht 65.0 in | Wt 214.0 lb

## 2016-08-26 DIAGNOSIS — T6391XD Toxic effect of contact with unspecified venomous animal, accidental (unintentional), subsequent encounter: Secondary | ICD-10-CM | POA: Diagnosis not present

## 2016-08-26 DIAGNOSIS — H1010 Acute atopic conjunctivitis, unspecified eye: Secondary | ICD-10-CM

## 2016-08-26 DIAGNOSIS — J309 Allergic rhinitis, unspecified: Secondary | ICD-10-CM

## 2016-08-26 DIAGNOSIS — L2084 Intrinsic (allergic) eczema: Secondary | ICD-10-CM

## 2016-08-26 DIAGNOSIS — J4530 Mild persistent asthma, uncomplicated: Secondary | ICD-10-CM

## 2016-08-26 MED ORDER — EPINEPHRINE 0.3 MG/0.3ML IJ SOAJ: 0.3000 mg | Freq: Once | INTRAMUSCULAR | 1 refills | Status: AC

## 2016-08-26 MED ORDER — ALBUTEROL SULFATE HFA 108 (90 BASE) MCG/ACT IN AERS: 1.0000 | INHALATION_SPRAY | Freq: Four times a day (QID) | RESPIRATORY_TRACT | 1 refills | Status: DC | PRN

## 2016-08-26 MED ORDER — MOMETASONE FUROATE 0.1 % EX OINT: TOPICAL_OINTMENT | Freq: Every day | CUTANEOUS | 0 refills | Status: DC

## 2016-08-26 MED ORDER — FLUTICASONE PROPIONATE HFA 110 MCG/ACT IN AERO: 2.0000 | INHALATION_SPRAY | Freq: Two times a day (BID) | RESPIRATORY_TRACT | 5 refills | Status: DC

## 2016-08-26 MED ORDER — CRISABOROLE 2 % EX OINT: 1.0000 "application " | TOPICAL_OINTMENT | Freq: Two times a day (BID) | CUTANEOUS | 5 refills | Status: DC

## 2016-08-26 MED ORDER — OLOPATADINE HCL 0.2 % OP SOLN: 1.0000 [drp] | Freq: Every day | OPHTHALMIC | 5 refills | Status: DC | PRN

## 2016-08-26 MED ORDER — FLUTICASONE PROPIONATE 50 MCG/ACT NA SUSP: 2.0000 | Freq: Every day | NASAL | 5 refills | Status: DC

## 2016-08-26 NOTE — Patient Instructions (Addendum)
Eczema     - start Eucrisa apply thin layer to affected area up to twice a day as needed.  May use all over body.  May use in addition with topical steroid creams.     - use mometasone 0.1% apply thin layer to affected area up to twice a day.  Do not use on face, arm pits or genitalia    - moisturize daily with emollients like Vaseline, Aquafor, Eucerin  Asthma    - doing well since quit smoking!!     - will change Qvar to Flovent 153mcg 2 puffs twice a day (due to insurance coverage)    - continue albuterol inhaler 2 puffs every 4-6 hours as needed for cough, wheeze, shortness of breath, chest tightness  Allergies    - will obtain environmental allergen panel    - use Zyrtec 10mg  daily, Flonase 2 sprays each nostril daily as needed for congestion, Olopatadine 1 drop each eye as needed for itchy/red/watery eyes  Bee allergy    - will obtain stinging insect panel    - avoid stinging insects as much as possible    - have access to epipen in case of allergic reaction    - follow emergency action plan in case of reaction  Follow-up 6 months or sooner if needed

## 2016-08-26 NOTE — Progress Notes (Signed)
Follow-up Note  RE: Sharon Vazquez MRN: [id number] DOB: [birth year] Date of Office Visit: 08/26/2016   History of present illness: Sharon Vazquez is a 36 y.o. female presenting today for follow-up of eczema.  She has a history of having asthma, rhinoconjunctivitis as well as venom allergy. She was last seen in the office on 05/22/2014 by Dr. Ishmael Holter.  She returns today as she has been having issue with eczema mostly on her face.  She reports the rash is very itchy.  She also has areas on her back and buttocks.  She reports the rash on her back is more near her surgical scars.  The last eczema cream she used was Elidel which stopped being effective.  She has tried other topical steroid creams in the past including triamcinolone and Aclovate.    She does have a history of asthma.   She quit smoking on Halloween 2017.  She denies any SOB or daytime or nighttime symptoms since she quit smoking.   She used albuterol twice a week when she was smoking but since stopped smoking she has not needed to use albuterol at all.  She continues to use Qvar 2 puffs daily.    She has an epipen for a bee allergy.  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  She recalls running over a yellow jacket nest while mowing.  She reports she got stung about 8 times and became very itchy and had difficulty breathing.  She went to ED and was treated for allergic reaction.  She also has a history of food allergy to peanuts.  However she eats peanuts now without issue.   She also reports runny nose, wheezing, SOB during spring, summer and fall she reports to pollens.  She also reports periorbital swelling as well as itchy, redness and watery eyes.  She uses allegra as needed.  She has used pataday and nasonex in the past.    She has had back surgery last one Nov. 2017 and has metal placement.      Review of systems: Review of Systems  Constitutional: Negative for chills, fever and  malaise/fatigue.  HENT: Positive for congestion. Negative for ear pain, nosebleeds, sinus pain, sore throat and tinnitus.   Eyes: Positive for redness. Negative for discharge.  Respiratory: Negative for cough, shortness of breath and wheezing.   Cardiovascular: Negative for chest pain.  Gastrointestinal: Negative for abdominal pain, heartburn, nausea and vomiting.  Musculoskeletal: Negative for joint pain and myalgias.  Skin: Positive for itching and rash.  Neurological: Negative for headaches.    All other systems negative unless noted above in HPI  Past medical/social/surgical/family history have been reviewed and are unchanged unless specifically indicated below.  No changes  Medication List: Allergies as of 08/26/2016      Reactions   Bee Venom Anaphylaxis   Dilaudid [hydromorphone Hcl] Anaphylaxis, Hives   Has tolerated morphine since this reaction   Other Anaphylaxis   Bandaid, Nuts, Peanut Oil    Peanut Oil Anaphylaxis   Senna Anaphylaxis, Hives   Adhesive [tape] Hives, Other (See Comments)   Pulls skin off (use paper tape)   Latex Hives      Medication List       Accurate as of 08/26/16  1:22 PM. Always use your most recent med list.          albuterol 108 (90 Base) MCG/ACT inhaler Commonly known as:  PROVENTIL HFA;VENTOLIN HFA Inhale 1-2 puffs into the  lungs every 6 (six) hours as needed for wheezing.   ALPRAZolam 3 MG 24 hr tablet Commonly known as:  XANAX XR Take 3 mg by mouth 2 (two) times daily.   ALUVEA 39 % Crea Generic drug:  Urea Apply 1 application topically daily.   cloNIDine 0.1 MG tablet Commonly known as:  CATAPRES Take 0.1 mg by mouth 2 (two) times daily.   clotrimazole-betamethasone cream Commonly known as:  LOTRISONE Apply topically 2 (two) times daily.   Crisaborole 2 % Oint Commonly known as:  EUCRISA Apply 1 application topically 2 (two) times daily.   doxepin 50 MG capsule Commonly known as:  SINEQUAN Take 1 capsule (50 mg  total) by mouth at bedtime.   DULoxetine 60 MG capsule Commonly known as:  CYMBALTA Take 60 mg by mouth 2 (two) times daily.   EPINEPHrine 0.3 mg/0.3 mL Soaj injection Commonly known as:  EPI-PEN Inject 0.3 mLs (0.3 mg total) into the muscle once.   fluticasone 110 MCG/ACT inhaler Commonly known as:  FLOVENT HFA Inhale 2 puffs into the lungs 2 (two) times daily.   fluticasone 50 MCG/ACT nasal spray Commonly known as:  FLONASE Place 2 sprays into both nostrils daily.   gemfibrozil 600 MG tablet Commonly known as:  LOPID Take 0.5 tablets (300 mg total) by mouth 2 (two) times daily before a meal.   HYDROcodone-acetaminophen 5-325 MG tablet Commonly known as:  NORCO/VICODIN Take 1-2 tablets by mouth every 6 (six) hours as needed for moderate pain.   lamoTRIgine 25 MG tablet Commonly known as:  LAMICTAL Take 1 tablet (25 mg total) by mouth daily.   medroxyPROGESTERone 150 MG/ML injection Commonly known as:  DEPO-PROVERA INJECT 1 VIAL INTRAMUSCULARLY EVERY 3 MONTHS IN OFFICE.   megestrol 40 MG tablet Commonly known as:  MEGACE TAKE 3 TABLETS DAILY FOR 5 DAYS, 2 TABLETS DAILY FOR 5 DAYS, THEN1 TABLET DAILY UNTIL BLEEDING STOPS.   mometasone 0.1 % ointment Commonly known as:  ELOCON Apply topically daily.   mometasone 50 MCG/ACT nasal spray Commonly known as:  NASONEX Place 2 sprays into the nose daily as needed.   NEXIUM 40 MG capsule Generic drug:  esomeprazole TAKE 1 CAPSULE ONCE DAILY AT 12 NOON.   Olopatadine HCl 0.2 % Soln Commonly known as:  PATADAY Place 1 drop into both eyes daily as needed (itchy eyes).   Plecanatide 3 MG Tabs Commonly known as:  TRULANCE Take 3 mg by mouth every morning.   propranolol 10 MG tablet Commonly known as:  INDERAL Take 10 mg by mouth 2 (two) times daily.   traMADol 50 MG tablet Commonly known as:  ULTRAM Take 1 tablet (50 mg total) by mouth every 6 (six) hours as needed.   triazolam 0.25 MG tablet Commonly known as:   HALCION Take 0.25 mg by mouth at bedtime as needed for sleep.       Known medication allergies: Allergies  Allergen Reactions  . Bee Venom Anaphylaxis  . Dilaudid [Hydromorphone Hcl] Anaphylaxis and Hives    Has tolerated morphine since this reaction  . Other Anaphylaxis    Bandaid, Nuts, Peanut Oil   . Peanut Oil Anaphylaxis  . Senna Anaphylaxis and Hives  . Adhesive [Tape] Hives and Other (See Comments)    Pulls skin off (use paper tape)  . Latex Hives     Physical examination: Blood pressure 132/80, pulse (!) 102, temperature 99.3 F (37.4 C), temperature source Oral, resp. rate 16, height 5\' 5"  (1.651 m), weight 214  lb (97.1 kg), SpO2 98 %.  General: Alert, interactive, in no acute distress. HEENT: PERRLA, TMs pearly gray, turbinates mildly edematous without discharge, post-pharynx non erythematous. Neck: Supple without lymphadenopathy. Lungs: Clear to auscultation without wheezing, rhonchi or rales. {no increased work of breathing. CV: Normal S1, S2 without murmurs. Abdomen: Nondistended, nontender. Skin: Warm and dry, without lesions or rashes. Extremities:  No clubbing, cyanosis or edema. Neuro:   Grossly intact.  Diagnositics/Labs: None today  Assessment and plan: Atopic dermatitis  - Start Eucrisa apply thin layer to affected area up to twice a day as needed.  May use all over body.  May use in addition with topical steroid creams.     - use mometasone 0.1% apply thin layer to affected area up to twice a day.  Do not use on face, arm pits or genitalia.  Maybe using conjunction with Eucrisa    - moisturize daily with emollients like Vaseline, Aquafor, Eucerin  Asthma, mild persistent     - doing well since quit smoking!!  encouraged her to keep up a strong work     - will change Qvar to [employer] 165mcg 2 puffs twice a day (due to insurance coverage)    - continue albuterol inhaler 2 puffs every 4-6 hours as needed for cough, wheeze, shortness of breath, chest  tightness   chronic rhinoconjunctivitis - will obtain environmental allergen panel    - use Zyrtec 10mg  daily, Flonase 2 sprays each nostril daily as needed for congestion, Olopatadine 1 drop each eye as needed for itchy/red/watery eyes   venom allergy    - will obtain stinging insect panel    - avoid stinging insects as much as possible    - have access to epipen in case of allergic reaction    - follow emergency action plan in case of reaction  Follow-up 6 months or sooner if needed  I appreciate the opportunity to take part in Livy's care. Please do not hesitate to contact me with questions.  Sincerely,   Prudy Feeler, MD Allergy/Immunology [facility]

## 2016-08-31 LAB — ALLERGENS, ZONE 3
Alternaria Alternata IgE: <0.1 kU/L
Cat Dander IgE: <0.1 kU/L
D Farinae IgE: 0.22 kU/L — AB
D001-IGE D PTERONYSSINUS: 0.14 kU/L — AB
E005-IGE DOG DANDER: 0.1 kU/L — AB
Elm, American IgE: 0.69 kU/L — AB
G002-IGE BERMUDA GRASS: 0.75 kU/L — AB
G008-IGE BLUEGRASS, KENTUCK: 0.7 kU/L — AB
G017-IGE BAHIA GRASS: 0.66 kU/L — AB
I206-IGE COCKROACH, AMERICAN: 0.38 kU/L — AB
Johnson Grass IgE: 0.73 kU/L — AB
M004-IGE MUCOR RACEMOSUS: 0.16 kU/L — AB
Oak, White IgE: 0.69 kU/L — AB
Pigweed, Rough IgE: 0.6 kU/L — AB
Plantain, English IgE: 0.7 kU/L — AB
Stemphylium Herbarum IgE: <0.1 kU/L
T001-IGE MAPLE/BOX ELDER: 0.71 kU/L — AB
T003-IGE COMMON SILVER BIRCH: 0.65 kU/L — AB
T006-IGE CEDAR, MOUNTAIN: 0.6 kU/L — AB
T041-IGE HICKORY, WHITE: 0.74 kU/L — AB
W001-IGE RAGWEED, SHORT: 0.7 kU/L — AB
W020-IGE NETTLE: 0.64 kU/L — AB
WHITE MULBERRY IGE: 0.51 kU/L — AB

## 2016-08-31 LAB — ALLERGEN STINGING INSECT PANEL
Honeybee IgE: 0.7 kU/L — AB
Hornet, White Face, IgE: 0.8 kU/L — AB
Hornet, Yellow, IgE: 0.71 kU/L — AB
Paper Wasp IgE: 0.56 kU/L — AB
Yellow Jacket, IgE: 3.85 kU/L — AB

## 2016-09-01 ENCOUNTER — Telehealth: Payer: Self-pay | Admitting: *Deleted

## 2016-09-01 ENCOUNTER — Telehealth (INDEPENDENT_AMBULATORY_CARE_PROVIDER_SITE_OTHER): Payer: Self-pay | Admitting: Internal Medicine

## 2016-09-01 DIAGNOSIS — T6391XD Toxic effect of contact with unspecified venomous animal, accidental (unintentional), subsequent encounter: Secondary | ICD-10-CM

## 2016-09-01 NOTE — Telephone Encounter (Signed)
The patient also added that she needs it to say that she can't take these medications because they upset her stomach.  She wanted me to be sure to add that her appointment is next Thursday and she needs this prior to that.

## 2016-09-01 NOTE — Telephone Encounter (Signed)
Patient presented to the office, she is requesting a note stating that she can not take aspirin, tylenol or ibuprofen.  She is fixing to go to a pain management clinic and needs this to take to them.  She stated that Dr. Laural Golden told her not to take these and she needs a note to show them when she goes.    [phone]

## 2016-09-01 NOTE — Telephone Encounter (Signed)
Patient called and said she would like to go ahead and start the venom injections. Is this okay? Please advise.

## 2016-09-06 ENCOUNTER — Encounter (HOSPITAL_COMMUNITY): Payer: Self-pay | Admitting: Emergency Medicine

## 2016-09-06 ENCOUNTER — Emergency Department (HOSPITAL_COMMUNITY)
Admission: EM | Admit: 2016-09-06 | Discharge: 2016-09-06 | Disposition: A | Discharge status: Home / Self Care | Payer: Medicaid Other | Attending: Emergency Medicine | Admitting: Emergency Medicine

## 2016-09-06 ENCOUNTER — Emergency Department (HOSPITAL_COMMUNITY): Payer: Medicaid Other

## 2016-09-06 ENCOUNTER — Ambulatory Visit (INDEPENDENT_AMBULATORY_CARE_PROVIDER_SITE_OTHER): Payer: Self-pay | Admitting: Internal Medicine

## 2016-09-06 DIAGNOSIS — F909 Attention-deficit hyperactivity disorder, unspecified type: Secondary | ICD-10-CM | POA: Insufficient documentation

## 2016-09-06 DIAGNOSIS — M791 Myalgia: Secondary | ICD-10-CM | POA: Insufficient documentation

## 2016-09-06 DIAGNOSIS — J45909 Unspecified asthma, uncomplicated: Secondary | ICD-10-CM | POA: Insufficient documentation

## 2016-09-06 DIAGNOSIS — R3915 Urgency of urination: Secondary | ICD-10-CM | POA: Diagnosis not present

## 2016-09-06 DIAGNOSIS — S79911A Unspecified injury of right hip, initial encounter: Secondary | ICD-10-CM | POA: Diagnosis present

## 2016-09-06 DIAGNOSIS — I10 Essential (primary) hypertension: Secondary | ICD-10-CM | POA: Diagnosis not present

## 2016-09-06 DIAGNOSIS — Z79899 Other long term (current) drug therapy: Secondary | ICD-10-CM | POA: Diagnosis not present

## 2016-09-06 DIAGNOSIS — M25551 Pain in right hip: Secondary | ICD-10-CM | POA: Insufficient documentation

## 2016-09-06 DIAGNOSIS — M549 Dorsalgia, unspecified: Secondary | ICD-10-CM | POA: Diagnosis not present

## 2016-09-06 DIAGNOSIS — R350 Frequency of micturition: Secondary | ICD-10-CM | POA: Diagnosis not present

## 2016-09-06 DIAGNOSIS — Y9389 Activity, other specified: Secondary | ICD-10-CM | POA: Insufficient documentation

## 2016-09-06 DIAGNOSIS — R52 Pain, unspecified: Secondary | ICD-10-CM

## 2016-09-06 DIAGNOSIS — Y92488 Other paved roadways as the place of occurrence of the external cause: Secondary | ICD-10-CM | POA: Diagnosis not present

## 2016-09-06 DIAGNOSIS — Z87891 Personal history of nicotine dependence: Secondary | ICD-10-CM | POA: Insufficient documentation

## 2016-09-06 DIAGNOSIS — Y999 Unspecified external cause status: Secondary | ICD-10-CM | POA: Diagnosis not present

## 2016-09-06 LAB — URINALYSIS, ROUTINE W REFLEX MICROSCOPIC
Bilirubin Urine: NEGATIVE
GLUCOSE, UA: NEGATIVE mg/dL
Hgb urine dipstick: NEGATIVE
Ketones, ur: NEGATIVE mg/dL
LEUKOCYTES UA: NEGATIVE
Nitrite: NEGATIVE
PROTEIN: NEGATIVE mg/dL
Specific Gravity, Urine: 1.004 — ABNORMAL LOW (ref 1.005–1.030)
pH: 6 (ref 5.0–8.0)

## 2016-09-06 MED ORDER — HYDROMORPHONE HCL 1 MG/ML IJ SOLN: 1.0000 mg | Freq: Once | INTRAMUSCULAR | Status: DC

## 2016-09-06 MED ORDER — LORAZEPAM 2 MG/ML IJ SOLN
0.5000 mg | Freq: Once | INTRAMUSCULAR | Status: AC
  Administered 2016-09-06: 0.5 mg via INTRAVENOUS
  Filled 2016-09-06: qty 1

## 2016-09-06 MED ORDER — PREDNISONE 50 MG PO TABS
60.0000 mg | ORAL_TABLET | Freq: Once | ORAL | Status: AC
  Administered 2016-09-06: 60 mg via ORAL
  Filled 2016-09-06: qty 1

## 2016-09-06 MED ORDER — PREDNISONE 10 MG PO TABS: 20.0000 mg | ORAL_TABLET | Freq: Every day | ORAL | 0 refills | Status: DC

## 2016-09-06 MED ORDER — OXYCODONE-ACETAMINOPHEN 5-325 MG PO TABS: 1.0000 | ORAL_TABLET | ORAL | 0 refills | Status: DC | PRN

## 2016-09-06 MED ORDER — OXYCODONE-ACETAMINOPHEN 5-325 MG PO TABS
2.0000 | ORAL_TABLET | Freq: Once | ORAL | Status: AC
  Administered 2016-09-06: 2 via ORAL
  Filled 2016-09-06: qty 2

## 2016-09-06 NOTE — Discharge Instructions (Signed)
Follow-up with your orthopedic doctor next week °

## 2016-09-06 NOTE — ED Notes (Signed)
edp aware of blood pressure
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
## 2016-09-06 NOTE — ED Notes (Signed)
ED Provider at bedside. 

## 2016-09-06 NOTE — ED Notes (Signed)
Pt taken to mri

## 2016-09-06 NOTE — ED Notes (Signed)
Pt in mri 

## 2016-09-06 NOTE — ED Notes (Signed)
Pt returned from mri
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
## 2016-09-08 ENCOUNTER — Emergency Department (HOSPITAL_COMMUNITY)
Admission: EM | Admit: 2016-09-08 | Discharge: 2016-09-08 | Disposition: A | Discharge status: Home / Self Care | Payer: Medicaid Other | Attending: Emergency Medicine | Admitting: Emergency Medicine

## 2016-09-08 ENCOUNTER — Encounter (HOSPITAL_COMMUNITY): Payer: Self-pay | Admitting: Emergency Medicine

## 2016-09-08 DIAGNOSIS — R0789 Other chest pain: Secondary | ICD-10-CM | POA: Insufficient documentation

## 2016-09-08 DIAGNOSIS — F909 Attention-deficit hyperactivity disorder, unspecified type: Secondary | ICD-10-CM | POA: Diagnosis not present

## 2016-09-08 DIAGNOSIS — Z87891 Personal history of nicotine dependence: Secondary | ICD-10-CM | POA: Insufficient documentation

## 2016-09-08 DIAGNOSIS — J45909 Unspecified asthma, uncomplicated: Secondary | ICD-10-CM | POA: Insufficient documentation

## 2016-09-08 DIAGNOSIS — R350 Frequency of micturition: Secondary | ICD-10-CM

## 2016-09-08 DIAGNOSIS — Z79899 Other long term (current) drug therapy: Secondary | ICD-10-CM | POA: Diagnosis not present

## 2016-09-08 DIAGNOSIS — I10 Essential (primary) hypertension: Secondary | ICD-10-CM | POA: Diagnosis not present

## 2016-09-08 LAB — URINALYSIS, ROUTINE W REFLEX MICROSCOPIC
Bilirubin Urine: NEGATIVE
GLUCOSE, UA: NEGATIVE mg/dL
Hgb urine dipstick: NEGATIVE
Ketones, ur: NEGATIVE mg/dL
LEUKOCYTES UA: NEGATIVE
Nitrite: NEGATIVE
PROTEIN: NEGATIVE mg/dL
Specific Gravity, Urine: 1.005 (ref 1.005–1.030)
pH: 6 (ref 5.0–8.0)

## 2016-09-08 MED ORDER — IBUPROFEN 600 MG PO TABS: 600.0000 mg | ORAL_TABLET | Freq: Four times a day (QID) | ORAL | 0 refills | Status: DC | PRN

## 2016-09-08 NOTE — ED Notes (Signed)
heploc removed

## 2016-09-08 NOTE — Discharge Instructions (Signed)
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
## 2016-09-15 ENCOUNTER — Other Ambulatory Visit (INDEPENDENT_AMBULATORY_CARE_PROVIDER_SITE_OTHER): Payer: Self-pay | Admitting: Internal Medicine

## 2016-09-21 ENCOUNTER — Ambulatory Visit: Payer: Medicaid Other

## 2016-09-21 ENCOUNTER — Telehealth: Payer: Self-pay

## 2016-09-21 NOTE — Telephone Encounter (Signed)
Dr. Nelva Bush,  Patient came in today to start her venom injections.  Can you please tell me which venom you would like for her to do. Patient is rescheduled for next Tuesday.  Thanks

## 2016-09-26 ENCOUNTER — Ambulatory Visit: Payer: Self-pay | Admitting: *Deleted

## 2016-09-26 DIAGNOSIS — T6391XD Toxic effect of contact with unspecified venomous animal, accidental (unintentional), subsequent encounter: Secondary | ICD-10-CM | POA: Diagnosis not present

## 2016-09-26 NOTE — Progress Notes (Signed)
Immunotherapy   Patient Details  Name: Sharon Vazquez MRN: [id number] Date of Birth: Aug 21, 1980  09/26/2016  Sharon Vazquez started injections for  Mixed Vespid, Wasp & Honey Bee. Following schedule: venom schedule.   Frequency:1 time per week Epi-Pen:Epi-Pen Available  Consent signed and patient instructions given. No problems after 30 minutes in the office.   Constance Holster 09/26/2016, 4:34 PM

## 2016-09-27 ENCOUNTER — Ambulatory Visit: Payer: Self-pay

## 2016-10-04 ENCOUNTER — Ambulatory Visit (INDEPENDENT_AMBULATORY_CARE_PROVIDER_SITE_OTHER): Payer: Medicaid Other | Admitting: *Deleted

## 2016-10-04 ENCOUNTER — Telehealth: Payer: Self-pay | Admitting: Allergy

## 2016-10-04 DIAGNOSIS — T6391XD Toxic effect of contact with unspecified venomous animal, accidental (unintentional), subsequent encounter: Secondary | ICD-10-CM | POA: Diagnosis not present

## 2016-10-04 MED ORDER — MONTELUKAST SODIUM 10 MG PO TABS: 10.0000 mg | ORAL_TABLET | Freq: Every day | ORAL | 5 refills | Status: DC

## 2016-10-04 NOTE — Telephone Encounter (Signed)
Spoke to patient states she is short of breath and would like to be switched to Allegra states the cetirizine is no longer working. She stopped smoking 6 months ago and this would have helped but it hasnt. Dr Nelva Bush please advise if we could switch patient or does she need to be seen.  Patient made aware that Delma Freeze is not covered by insurance. Please advise

## 2016-10-04 NOTE — Telephone Encounter (Signed)
patient would like prescription or ALLEGRA called into [facility] in [place]

## 2016-10-04 NOTE — Telephone Encounter (Signed)
Is she needing to use her albuterol? Please ensure she is taking flovent as prescribed.  She may benefit from adding singulair- I do not think I recommended that at last visit.   If she is willing try Singulair 10mg  daily lets add then into regimen and stay on zyrtec.   If this does not help then we can change to zyrtec to Allegra since it is not covered by insurance.

## 2016-10-04 NOTE — Telephone Encounter (Signed)
Sent in [place] states she is using abluterol hfa 2 times daily x 1 wk and is using Flovent as prescribed states she has an appt on Friday May 4 with you do you want her to been seen sooner?

## 2016-10-06 ENCOUNTER — Other Ambulatory Visit: Payer: Self-pay

## 2016-10-06 NOTE — Telephone Encounter (Signed)
Received fax from [facility] in [place] in regards to a refill for [employer]. I sent fax back with a refill.

## 2016-10-07 ENCOUNTER — Other Ambulatory Visit: Payer: Self-pay | Admitting: Adult Health

## 2016-10-07 ENCOUNTER — Ambulatory Visit (INDEPENDENT_AMBULATORY_CARE_PROVIDER_SITE_OTHER): Payer: Medicaid Other | Admitting: Allergy

## 2016-10-07 ENCOUNTER — Other Ambulatory Visit: Payer: Self-pay | Admitting: Women's Health

## 2016-10-07 ENCOUNTER — Encounter: Payer: Self-pay | Admitting: Allergy

## 2016-10-07 VITALS — BP 110/70 | HR 110 | Temp 97.8°F | Resp 19

## 2016-10-07 DIAGNOSIS — H1010 Acute atopic conjunctivitis, unspecified eye: Secondary | ICD-10-CM

## 2016-10-07 DIAGNOSIS — T6391XD Toxic effect of contact with unspecified venomous animal, accidental (unintentional), subsequent encounter: Secondary | ICD-10-CM | POA: Diagnosis not present

## 2016-10-07 DIAGNOSIS — J4530 Mild persistent asthma, uncomplicated: Secondary | ICD-10-CM

## 2016-10-07 DIAGNOSIS — L2084 Intrinsic (allergic) eczema: Secondary | ICD-10-CM | POA: Diagnosis not present

## 2016-10-07 DIAGNOSIS — J309 Allergic rhinitis, unspecified: Secondary | ICD-10-CM | POA: Diagnosis not present

## 2016-10-07 MED ORDER — FEXOFENADINE HCL 180 MG PO TABS: 180.0000 mg | ORAL_TABLET | Freq: Every day | ORAL | 5 refills | Status: DC

## 2016-10-07 MED ORDER — BUDESONIDE-FORMOTEROL FUMARATE 160-4.5 MCG/ACT IN AERO: 2.0000 | INHALATION_SPRAY | Freq: Two times a day (BID) | RESPIRATORY_TRACT | 5 refills | Status: DC

## 2016-10-07 NOTE — Patient Instructions (Addendum)
Asthma    - keep up the good work with not smoking!!    - will step-up therapy to Symbicort 160 mcg take 2 puffs twice a day    - stop Flovent/Qvar    - continue albuterol inhaler 2 puffs every 4-6 hours as needed for cough, wheeze, shortness of breath, chest tightness    - continue Singulair 10mg  take at bedtime  Asthma control goals:   Full participation in all desired activities (may need albuterol before activity)  Albuterol use two time or less a week on average (not counting use with activity)  Cough interfering with sleep two time or less a month  Oral steroids no more than once a year  No hospitalizations   Eczema     - continue Eucrisa apply thin layer to affected area up to twice a day as needed.  May use all over body.  May use in addition with topical steroid creams.     - continue mometasone 0.1% apply thin layer to affected area up to twice a day.  Do not use on face, arm pits or genitalia    - moisturize daily with emollients like Vaseline, Aquafor, Eucerin  Allergies    - continue allergen avoidance measures for dust mites, dog, grasses, cockroach, mold, trees, weeds    - use Allegra 180mg  daily    - hold your flonase for next several days due to nosebleeds.      - use nasal saline spray into each nostril to help moisturize the nose    - Olopatadine 1 drop each eye as needed for itchy/red/watery eyes  Bee allergy    - continue venom shots as scheduled    - avoid stinging insects as much as possible    - have access to epipen in case of allergic reaction    - follow emergency action plan in case of reaction  Follow-up 6 months or sooner if needed

## 2016-10-07 NOTE — Progress Notes (Signed)
Follow-up Note  RE: Sharon Vazquez MRN: [id number] DOB: 1981/02/09 Date of Office Visit: 10/07/2016   History of present illness: Sharon Vazquez is a 36 y.o. female presenting today for follow-up of asthma, allergies, eczema as well as venom allergy.  She was last seen in the office on 08/26/16 for eczema, asthma, rhinoconjunctivitis and venom allergy.  Since last visit she was started on venom injections and tolerates them well without local or systemic reactions.    She returns today as she is complaining of more SOB and chest tightness.  She is now using Flovent 2 puffs twice a day (was taking Qvar but changed due to insurance coverage) and she reports she just started Singulair several days ago.  She is using albuterol now twice a day due to symptoms.  Denies any nighttime awakenings.  She has not had any oral steroids or need for ED/UC visits.   She also complains she has had nosebleeds the past 3 days.  She was using flonase as well as zyrtec and olopatadine as needed.   She reports her skin has been much better with use of eucrisa and Elecon.   She is planning to undergo another back surgery in the next several weeks.      Review of systems: Review of Systems  Constitutional: Negative for chills, fever and malaise/fatigue.  HENT: Positive for congestion and nosebleeds. Negative for ear discharge, ear pain, sinus pain, sore throat and tinnitus.   Eyes: Negative for discharge and redness.  Respiratory: Positive for cough, shortness of breath and wheezing. Negative for sputum production.   Cardiovascular: Negative for chest pain.  Gastrointestinal: Negative for abdominal pain, diarrhea, heartburn, nausea and vomiting.  Musculoskeletal: Positive for back pain.  Skin: Positive for itching and rash.  Neurological: Negative for headaches.    All other systems negative unless noted above in HPI  Past medical/social/surgical/family history have been reviewed and are unchanged  unless specifically indicated below.  No changes  Medication List: Allergies as of 10/07/2016      Reactions   Bee Venom Anaphylaxis   Dilaudid [hydromorphone Hcl] Anaphylaxis, Hives   Has tolerated morphine since this reaction   Other Anaphylaxis   Bandaid, Nuts, Peanut Oil    Peanut Oil Anaphylaxis   Senna Anaphylaxis, Hives   Adhesive [tape] Hives, Other (See Comments)   Pulls skin off (use paper tape)   Latex Hives      Medication List       Accurate as of 10/07/16  1:40 PM. Always use your most recent med list.          albuterol 108 (90 Base) MCG/ACT inhaler Commonly known as:  PROVENTIL HFA;VENTOLIN HFA Inhale 1-2 puffs into the lungs every 6 (six) hours as needed for wheezing.   ALPRAZolam 3 MG 24 hr tablet Commonly known as:  XANAX XR Take 3 mg by mouth 2 (two) times daily.   ALUVEA 39 % Crea Generic drug:  Urea Apply 1 application topically daily.   cloNIDine 0.1 MG tablet Commonly known as:  CATAPRES Take 0.1 mg by mouth 2 (two) times daily.   Crisaborole 2 % Oint Commonly known as:  EUCRISA Apply 1 application topically 2 (two) times daily.   doxepin 50 MG capsule Commonly known as:  SINEQUAN Take 1 capsule (50 mg total) by mouth at bedtime.   DULoxetine 60 MG capsule Commonly known as:  CYMBALTA Take 60 mg by mouth 2 (two) times daily.   fluticasone 50 MCG/ACT  nasal spray Commonly known as:  FLONASE Place 2 sprays into both nostrils daily.   gemfibrozil 600 MG tablet Commonly known as:  LOPID Take 0.5 tablets (300 mg total) by mouth 2 (two) times daily before a meal.   HYDROcodone-acetaminophen 5-325 MG tablet Commonly known as:  NORCO/VICODIN Take 1-2 tablets by mouth every 6 (six) hours as needed for moderate pain.   ibuprofen 600 MG tablet Commonly known as:  ADVIL,MOTRIN Take 1 tablet (600 mg total) by mouth every 6 (six) hours as needed.   lamoTRIgine 25 MG tablet Commonly known as:  LAMICTAL Take 1 tablet (25 mg total) by mouth  daily.   medroxyPROGESTERone 150 MG/ML injection Commonly known as:  DEPO-PROVERA INJECT 1 VIAL INTRAMUSCULARLY EVERY 3 MONTHS IN OFFICE.   megestrol 40 MG tablet Commonly known as:  MEGACE TAKE 3 TABLETS DAILY FOR 5 DAYS, 2 TABLETS DAILY FOR 5 DAYS, THEN1 TABLET DAILY UNTIL BLEEDING STOPS.   mometasone 50 MCG/ACT nasal spray Commonly known as:  NASONEX Place 2 sprays into the nose daily as needed.   montelukast 10 MG tablet Commonly known as:  SINGULAIR Take 1 tablet (10 mg total) by mouth at bedtime.   NEXIUM 40 MG capsule Generic drug:  esomeprazole TAKE 1 CAPSULE ONCE DAILY AT 12 NOON.   Olopatadine HCl 0.2 % Soln Commonly known as:  PATADAY Place 1 drop into both eyes daily as needed (itchy eyes).   oxyCODONE-acetaminophen 5-325 MG tablet Commonly known as:  PERCOCET/ROXICET Take 1-2 tablets by mouth every 4 (four) hours as needed.   Plecanatide 3 MG Tabs Commonly known as:  TRULANCE Take 3 mg by mouth every morning.   propranolol 10 MG tablet Commonly known as:  INDERAL Take 10 mg by mouth 2 (two) times daily.   triazolam 0.25 MG tablet Commonly known as:  HALCION Take 0.25 mg by mouth at bedtime as needed for sleep.       Known medication allergies: Allergies  Allergen Reactions  . Bee Venom Anaphylaxis  . Dilaudid [Hydromorphone Hcl] Anaphylaxis and Hives    Has tolerated morphine since this reaction  . Other Anaphylaxis    Bandaid, Nuts, Peanut Oil   . Peanut Oil Anaphylaxis  . Senna Anaphylaxis and Hives  . Adhesive [Tape] Hives and Other (See Comments)    Pulls skin off (use paper tape)  . Latex Hives     Physical examination: Blood pressure 110/70, pulse (!) 110, temperature 97.8 F (36.6 C), temperature source Oral, resp. rate 19.  General: Alert, interactive, in no acute distress. HEENT: PERRLA, TMs pearly gray, turbinates mildly edematous with crusty discharge, post-pharynx non erythematous. Neck: Supple without  lymphadenopathy. Lungs: Clear to auscultation without wheezing, rhonchi or rales. {no increased work of breathing. CV: Normal S1, S2 without murmurs. Abdomen: Nondistended, nontender. Skin: Warm and dry, without lesions or rashes. Extremities:  No clubbing, cyanosis or edema. Neuro:   Grossly intact.  Diagnositics/Labs: Labs:  Component     Latest Ref Rng & Units 08/26/2016  D Pteronyssinus IgE     Class 0/I kU/L 0.14 (A)  D Farinae IgE     Class 0/I kU/L 0.22 (A)  Cat Dander IgE     Class 0 kU/L <0.10  Dog Dander IgE     Class 0/I kU/L 0.10 (A)  [place] Grass IgE     Class II kU/L 0.75 (A)  [place] Bluegrass IgE     Class II kU/L 0.70 (A)  [place] Grass IgE     Class  II kU/L 0.73 (A)  B[place]ia Grass IgE     Class II kU/L 0.66 (A)  Cockroach, American IgE     Class I kU/L 0.38 (A)  Penicillium Chrysogen IgE     Class 0 kU/L <0.10  Cladosporium Herbarum IgE     Class 0 kU/L <0.10  Aspergillus Fumigatus IgE     Class 0 kU/L <0.10  Mucor Racemosus IgE     Class 0/I kU/L 0.16 (A)  Alternaria Alternata IgE     Class 0 kU/L <0.10  Stemphylium Herbarum IgE     Class 0 kU/L <0.10  Common Silver Wendee Copp IgE     Class II kU/L 0.65 (A)  Oak, White IgE     Class II kU/L 0.69 (A)  Elm, American IgE     Class II kU/L 0.69 (A)  Maple/Box Elder IgE     Class II kU/L 0.71 (A)  Hickory, White IgE     Class II kU/L 0.74 (A)  White Mulberry IgE     Class I kU/L 0.51 (A)  Cedar, Mountain IgE     Class II kU/L 0.60 (A)  Ragweed, Short IgE     Class II kU/L 0.70 (A)  Plantain, English IgE     Class II kU/L 0.70 (A)  Pigweed, Rough IgE     Class II kU/L 0.60 (A)  Nettle IgE     Class II kU/L 0.64 (A)   Component     Latest Ref Rng & Units 08/26/2016  Honeybee IgE     Class II kU/L 0.70 (A)  Hornet, White Face, IgE     Class II kU/L 0.80 (A)  Yellow Jacket, IgE     Class III kU/L 3.85 (A)  Paper Wasp IgE     Class II kU/L 0.56 (A)  Hornet, Yellow, IgE     Class II kU/L  0.71 (A)    Spirometry: FEV1: 2.71L  106%, FVC: 3.06L  102%, ratio consistent with Nonobstructive pattern  Assessment and plan:   Asthma, Mild persistent    - keep up the good work with not smoking!!    - will step-up therapy to Symbicort 160 mcg take 2 puffs twice a day    - stop Flovent/Qvar    - continue albuterol inhaler 2 puffs every 4-6 hours as needed for cough, wheeze, shortness of breath, chest tightness    - continue Singulair 10mg  take at bedtime  Asthma control goals:   Full participation in all desired activities (may need albuterol before activity)  Albuterol use two time or less a week on average (not counting use with activity)  Cough interfering with sleep two time or less a month  Oral steroids no more than once a year  No hospitalizations  Atopic dermatitis     - continue Eucrisa apply thin layer to affected area up to twice a day as needed.  May use all over body.  May use in addition with topical steroid creams.     - continue mometasone 0.1% apply thin layer to affected area up to twice a day.  Do not use on face, arm pits or genitalia    - moisturize daily with emollients like Vaseline, Aquafor, Eucerin  Allergic rhinoconjunctivitis    - continue allergen avoidance measures for dust mites, dog, grasses, cockroach, mold, trees, weeds    - use Allegra 180mg  daily    - hold your flonase for next several days due to nosebleeds.      -  use nasal saline spray into each nostril to help moisturize the nose    - Olopatadine 1 drop each eye as needed for itchy/red/watery eyes  Venom allergy    - continue venom shots as scheduled    - avoid stinging insects as much as possible    - have access to epipen in case of allergic reaction    - follow emergency action plan in case of reaction  Follow-up 6 months or sooner if needed   I appreciate the opportunity to take part in [place]'s care. Please do not hesitate to contact me with  questions.  Sincerely,   Prudy Feeler, MD Allergy/Immunology [facility]

## 2016-10-10 ENCOUNTER — Other Ambulatory Visit: Payer: Self-pay | Admitting: Orthopedic Surgery

## 2016-10-10 ENCOUNTER — Ambulatory Visit (INDEPENDENT_AMBULATORY_CARE_PROVIDER_SITE_OTHER): Payer: Medicaid Other

## 2016-10-10 DIAGNOSIS — T63441D Toxic effect of venom of bees, accidental (unintentional), subsequent encounter: Secondary | ICD-10-CM

## 2016-10-10 DIAGNOSIS — T6391XA Toxic effect of contact with unspecified venomous animal, accidental (unintentional), initial encounter: Secondary | ICD-10-CM | POA: Insufficient documentation

## 2016-10-12 ENCOUNTER — Encounter (HOSPITAL_COMMUNITY)
Admission: RE | Admit: 2016-10-12 | Discharge: 2016-10-12 | Disposition: A | Discharge status: Home / Self Care | Payer: Medicaid Other | Source: Ambulatory Visit | Attending: Orthopedic Surgery | Admitting: Orthopedic Surgery

## 2016-10-12 ENCOUNTER — Ambulatory Visit (HOSPITAL_COMMUNITY)
Admission: RE | Admit: 2016-10-12 | Discharge: 2016-10-12 | Disposition: A | Discharge status: Home / Self Care | Payer: Medicaid Other | Source: Ambulatory Visit | Attending: Orthopedic Surgery | Admitting: Orthopedic Surgery

## 2016-10-12 ENCOUNTER — Encounter (HOSPITAL_COMMUNITY): Payer: Self-pay

## 2016-10-12 DIAGNOSIS — Z01810 Encounter for preprocedural cardiovascular examination: Secondary | ICD-10-CM | POA: Diagnosis not present

## 2016-10-12 DIAGNOSIS — Z01818 Encounter for other preprocedural examination: Secondary | ICD-10-CM

## 2016-10-12 DIAGNOSIS — Z01812 Encounter for preprocedural laboratory examination: Secondary | ICD-10-CM | POA: Diagnosis not present

## 2016-10-12 HISTORY — DX: Depression, unspecified: F32.A

## 2016-10-12 HISTORY — DX: Cough: R05

## 2016-10-12 HISTORY — DX: Cough, unspecified: R05.9

## 2016-10-12 HISTORY — DX: Major depressive disorder, single episode, unspecified: F32.9

## 2016-10-12 LAB — SURGICAL PCR SCREEN
MRSA, PCR: NEGATIVE
STAPHYLOCOCCUS AUREUS: POSITIVE — AB

## 2016-10-12 LAB — TYPE AND SCREEN
ABO/RH(D): O POS
Antibody Screen: NEGATIVE

## 2016-10-12 LAB — CBC WITH DIFFERENTIAL/PLATELET
BASOS ABS: 0 10*3/uL (ref 0.0–0.1)
Basophils Relative: 0 %
EOS ABS: 0.6 10*3/uL (ref 0.0–0.7)
EOS PCT: 6 %
HCT: 36 % (ref 36.0–46.0)
Hemoglobin: 12.4 g/dL (ref 12.0–15.0)
LYMPHS PCT: 36 %
Lymphs Abs: 3.5 10*3/uL (ref 0.7–4.0)
MCH: 30.4 pg (ref 26.0–34.0)
MCHC: 34.4 g/dL (ref 30.0–36.0)
MCV: 88.2 fL (ref 78.0–100.0)
MONO ABS: 0.4 10*3/uL (ref 0.1–1.0)
Monocytes Relative: 4 %
Neutro Abs: 5.3 10*3/uL (ref 1.7–7.7)
Neutrophils Relative %: 54 %
PLATELETS: 453 10*3/uL — AB (ref 150–400)
RBC: 4.08 MIL/uL (ref 3.87–5.11)
RDW: 14 % (ref 11.5–15.5)
WBC: 9.7 10*3/uL (ref 4.0–10.5)

## 2016-10-12 LAB — COMPREHENSIVE METABOLIC PANEL
ALT: 17 U/L (ref 14–54)
AST: 21 U/L (ref 15–41)
Albumin: 4.1 g/dL (ref 3.5–5.0)
Alkaline Phosphatase: 49 U/L (ref 38–126)
Anion gap: 9 (ref 5–15)
BUN: 5 mg/dL — ABNORMAL LOW (ref 6–20)
CHLORIDE: 104 mmol/L (ref 101–111)
CO2: 25 mmol/L (ref 22–32)
Calcium: 9.1 mg/dL (ref 8.9–10.3)
Creatinine, Ser: 0.91 mg/dL (ref 0.44–1.00)
Glucose, Bld: 90 mg/dL (ref 65–99)
POTASSIUM: 3.9 mmol/L (ref 3.5–5.1)
SODIUM: 138 mmol/L (ref 135–145)
Total Bilirubin: 0.8 mg/dL (ref 0.3–1.2)
Total Protein: 7.3 g/dL (ref 6.5–8.1)

## 2016-10-12 LAB — URINALYSIS, ROUTINE W REFLEX MICROSCOPIC
Bilirubin Urine: NEGATIVE
Glucose, UA: NEGATIVE mg/dL
Hgb urine dipstick: NEGATIVE
KETONES UR: NEGATIVE mg/dL
LEUKOCYTES UA: NEGATIVE
NITRITE: NEGATIVE
PROTEIN: NEGATIVE mg/dL
Specific Gravity, Urine: 1.013 (ref 1.005–1.030)
pH: 5 (ref 5.0–8.0)

## 2016-10-12 LAB — PROTIME-INR
INR: 1.11
PROTHROMBIN TIME: 14.3 s (ref 11.4–15.2)

## 2016-10-12 LAB — APTT: APTT: 54 s — AB (ref 24–36)

## 2016-10-12 LAB — HCG, SERUM, QUALITATIVE: Preg, Serum: NEGATIVE

## 2016-10-12 NOTE — Pre-Procedure Instructions (Signed)
Sharon Vazquez  10/12/2016      [place], [place] - [id number] [place] [address] Phone: [phone] Fax: [phone]  [place], [place] [address] [place] [address] Phone: [phone] Fax: [phone]    Your procedure is scheduled on  Thursday  10/20/16  Report to [facility] Admitting at 930 A.M.  Call this number if you have problems the morning of surgery:  [phone]   Remember:  Do not eat food or drink liquids after midnight.  Take these medicines the morning of surgery with A SIP OF WATER  ALBUTEROL AND FLUTICASONE  INHALER, ALPRAZOLAM (XANAX), CLONIDINE, DULOXETINE (CYMBALTA), HYDROCODONE IF NEEDED, LAMICTAL, EYE DROPS, PROPRANOLOL (INDERAL), DOXEPIN                  (STOP 7 DAYS PRIOR TO SURGERY ASPIRIN OR ASPIRIN PRODUCTS, IBUPROFEN/ ADVIL/ MOTRIN, DICLOFENAC/ VOLTAREN, GOODY POWDERS, BC'S, HERBAL MEDICINES)     Do not wear jewelry, make-up or nail polish.  Do not wear lotions, powders, or perfumes, or deoderant.  Do not shave 48 hours prior to surgery.  Men may shave face and neck.  Do not bring valuables to the hospital.  [facility] is not responsible for any belongings or valuables.  Contacts, dentures or bridgework may not be worn into surgery.  Leave your suitcase in the car.  After surgery it may be brought to your room.  For patients admitted to the hospital, discharge time will be determined by your treatment team.  Patients discharged the day of surgery will not be allowed to drive home.   Name and phone number of your driver:    Special instructions:  [place] - Preparing for Surgery  Before surgery, you can play an important role.  Because skin is not sterile, your skin needs to be as free of germs as possible.  You can reduce the number of germs on you skin by washing with CHG (chlorahexidine gluconate) soap before  surgery.  CHG is an antiseptic cleaner which kills germs and bonds with the skin to continue killing germs even after washing.  Please DO NOT use if you have an allergy to CHG or antibacterial soaps.  If your skin becomes reddened/irritated stop using the CHG and inform your nurse when you arrive at Short Stay.  Do not shave (including legs and underarms) for at least 48 hours prior to the first CHG shower.  You may shave your face.  Please follow these instructions carefully:   1.  Shower with CHG Soap the night before surgery and the                                morning of Surgery.  2.  If you choose to wash your hair, wash your hair first as usual with your       normal shampoo.  3.  After you shampoo, rinse your hair and body thoroughly to remove the                      Shampoo.  4.  Use CHG as you would any other liquid soap.  You can apply chg directly       to the skin and wash gently with scrungie or a clean washcloth.  5.  Apply the CHG Soap to  your body ONLY FROM THE NECK DOWN.        Do not use on open wounds or open sores.  Avoid contact with your eyes,       ears, mouth and genitals (private parts).  Wash genitals (private parts)       with your normal soap.  6.  Wash thoroughly, paying special attention to the area where your surgery        will be performed.  7.  Thoroughly rinse your body with warm water from the neck down.  8.  DO NOT shower/wash with your normal soap after using and rinsing off       the CHG Soap.  9.  Pat yourself dry with a clean towel.            10.  Wear clean pajamas.            11.  Place clean sheets on your bed the night of your first shower and do not        sleep with pets.  Day of Surgery  Do not apply any lotions/deoderants the morning of surgery.  Please wear clean clothes to the hospital/surgery center.    Please read over the following fact sheets that you were given. MRSA Information and Surgical Site Infection Prevention

## 2016-10-12 NOTE — Progress Notes (Signed)
   10/12/16 1300  OBSTRUCTIVE SLEEP APNEA  Have you ever been diagnosed with sleep apnea through a sleep study? No  Do you snore loudly (loud enough to be heard through closed doors)?  1  Do you often feel tired, fatigued, or sleepy during the daytime (such as falling asleep during driving or talking to someone)? 1  Has anyone observed you stop breathing during your sleep? 1 (WOKE UP GASPING FOR AIR)  Do you have, or are you being treated for high blood pressure? 1  BMI more than 35 kg/m2? 0  Age > 50 (1-yes) 0  Neck circumference greater than:Female 16 inches or larger, Female 17inches or larger? 1  Female Gender (Yes=1) 0  Obstructive Sleep Apnea Score 5  Score 5 or greater  Results sent to PCP

## 2016-10-13 ENCOUNTER — Encounter (HOSPITAL_COMMUNITY): Payer: Self-pay

## 2016-10-13 NOTE — Progress Notes (Signed)
Anesthesia Chart Review:  Pt is a 36 year old female scheduled for L4-5 transforaminal lumbar interbody fusion with instrumentation and allograft on 10/20/2016 with Phylliss Bob, MD  PMH includes:  HTN, asthma, bipolar disorder, ADHD, borderline personality disorder, GERD. Former smoker. BMI 34. S/p multiple prior spine surgeries/procedures   Medications include: Albuterol, clonidine, Flovent, gemfibrozil, Lamictal, Nexium, propranolol, halcion  Preoperative labs reviewed.  PT normal, PTT 54. I notified Carla in Dr. Laurena Bering office of abnormal PTT.   CXR 10/12/16: No active cardiopulmonary disease.  EKG 09/08/16: sinus rhythm.   If no changes, I anticipate pt can proceed with surgery as scheduled.   Willeen Cass, FNP-BC [facility] Short Stay Surgical Center/Anesthesiology Phone: [phone] 10/13/2016 4:09 PM

## 2016-10-17 ENCOUNTER — Ambulatory Visit (INDEPENDENT_AMBULATORY_CARE_PROVIDER_SITE_OTHER): Payer: Medicaid Other

## 2016-10-17 DIAGNOSIS — T63441D Toxic effect of venom of bees, accidental (unintentional), subsequent encounter: Secondary | ICD-10-CM | POA: Diagnosis not present

## 2016-10-17 NOTE — H&P (Signed)
PREOPERATIVE H&P  Chief Complaint: R leg pain  HPI: Sharon Vazquez is a 36 y.o. female who presents with ongoing pain in the right leg. Patient is s/p a previous fusion at L3/4, and did well, but then developed R leg pain  MRI reveals a moderate right L4/5 HNP with DDD  Patient has failed multiple forms of conservative care and continues to have pain (see office notes for additional details regarding the patient's full course of treatment)  Past Medical History:  Diagnosis Date  . Abnormal Pap smear   . ADHD (attention deficit hyperactivity disorder)   . Anxiety   . Anxiety    takes Xanax daily as needed  . Arthritis    degenerative spine  . Asthma    Ventolin as needed and QVAR takes daily  . Back spasm    takes Flexeril daily as needed  . Bipolar 1 disorder ([place])   . Bipolar 1 disorder ([facility])    takes DOxepin daily  . Borderline personality disorder   . Chronic back pain    HNP  . Constipation    takes Dulcolax daily as needed takes Amitiza daily  . Contraceptive management 08/23/2013  . Cough    BROWN- GREEN THICK MUCUS  . Depression   . Eczema    has 2 creams uses as needed  . GERD (gastroesophageal reflux disease)    takes Tums as needed  . Headache(784.0)   . Headache(784.0)    migraines-last one about 15months ago;takes Topamax daily  . History of bronchitis    last time 4-75yrs ago  . History of colon polyps   . HSV-2 (herpes simplex virus 2) infection   . Hx of chlamydia infection   . Hypertension   . Hypertension    takes INderal and Clonidine daily  . IBS (irritable bowel syndrome)   . Insomnia    takes Ambien nightly  . Internal hemorrhoids   . Irregular periods 04/02/2014  . Joint swelling    right knee  . Mental disorder    takes Abilify as needed  . Obesity   . Panic attack   . Panic disorder   . Sciatica   . Shortness of breath    with exertion  . Urinary urgency   . Weakness    numbness and tingling to left foot   Past  Surgical History:  Procedure Laterality Date  . BACK SURGERY    . bunion removal    . BUNIONECTOMY Left    pins in big toe and 2nd toe  . CHOLECYSTECTOMY  10 yrs ago  . CHOLECYSTECTOMY  10+yrs ago  . COLONOSCOPY N/A 08/22/2012   Procedure: COLONOSCOPY;  Surgeon: Rogene Houston, MD;  [facility];  Service: Endoscopy;  Laterality: N/A;  100  . COLONOSCOPY    . COLONOSCOPY WITH PROPOFOL N/A 09/25/2015   Procedure: COLONOSCOPY WITH PROPOFOL;  Surgeon: Rogene Houston, MD;  Location: [facility];  Service: Endoscopy;  Laterality: N/A;  2:30-moved to 7:30 Ann notified pt  . epidural injections     x 2  . LUMBAR FUSION  04/06/2016  . LUMBAR LAMINECTOMY/DECOMPRESSION MICRODISCECTOMY Left 07/03/2013   Procedure: LEFT LUMBAR THREE-FOUR microdiskectomy;  Surgeon: Winfield Cunas, MD;  Location: [place] [facility];  Service: Neurosurgery;  Laterality: Left;  LEFT LUMBAR THREE-FOUR microdiskectomy  . LUMBAR LAMINECTOMY/DECOMPRESSION MICRODISCECTOMY Left 11/29/2013   Procedure: LEFT Lumbar Four-Five Redo microdiskectomy;  Surgeon: Winfield Cunas, MD;  Location: [facility];  Service: Neurosurgery;  Laterality: Left;  LEFT Lumbar Four-Five Redo microdiskectomy  . POLYPECTOMY  09/25/2015   Procedure: POLYPECTOMY;  Surgeon: Rogene Houston, MD;  Location: [facility];  Service: Endoscopy;;  at cecum  . SPINAL CORD STIMULATOR INSERTION N/A 06/13/2014   Procedure: LUMBAR SPINAL CORD STIMULATOR INSERTION;  Surgeon: Ashok Pall, MD;  Location: [place] [facility];  Service: Neurosurgery;  Laterality: N/A;  permanent spinal cord stimulator insertion  . SPINAL CORD STIMULATOR REMOVAL N/A 12/17/2014   Procedure: THORACIC SPINAL CORD STIMULATOR REMOVAL;  Surgeon: Ashok Pall, MD;  Location: [place] [facility];  Service: Neurosurgery;  Laterality: N/A;  THORACIC SPINAL CORD STIMULATOR REMOVAL  . SPINAL CORD STIMULATOR TRIAL N/A 04/23/2014   Procedure: LUMBAR SPINAL CORD STIMULATOR TRIAL;  Surgeon: Ashok Pall, MD;   Location: [place] [facility];  Service: Neurosurgery;  Laterality: N/A;  Spinal Cord Stimulator Trial  . TONSILLECTOMY    . TONSILLECTOMY     as a child  . wisdom teeth extracted    . WISDOM TOOTH EXTRACTION     Social History   Social History  . Marital status: Legally Separated    Spouse name: N/A  . Number of children: N/A  . Years of education: N/A   Social History Main Topics  . Smoking status: Former Smoker    Packs/day: 0.00    Years: 9.00    Types: Cigarettes  . Smokeless tobacco: Never Used     Comment: stopped 04/05/16  . Alcohol use 0.0 oz/week     Comment: OCC WINE  . Drug use: No     Comment: NONE 35-40 DAYS  . Sexual activity: Yes    Birth control/ protection: Injection   Other Topics Concern  . Not on file   Social History Narrative   ** Merged History Encounter **       ** Merged History Encounter **       Family History  Problem Relation Age of Onset  . Diabetes Mother   . Thyroid disease Mother   . Other Mother        PTSD  . Hyperlipidemia Mother   . Hypertension Maternal Aunt   . Diabetes Maternal Aunt   . Thyroid disease Maternal Aunt   . Diabetes Maternal Grandmother   . Heart disease Maternal Grandmother        CHF  . Hypertension Maternal Grandmother   . Dementia Maternal Grandmother   . Diabetes Father   . Hypertension Father   . Obesity Father   . Cancer Paternal Grandmother        breast  . Alcohol abuse Paternal Grandmother   . Crohn's disease Maternal Uncle   . Hyperlipidemia Maternal Uncle   . Other Maternal Uncle        back pain  . Dementia Maternal Uncle   . Diabetes Cousin   . Other Maternal Grandfather        lung transplant   Allergies  Allergen Reactions  . Bee Venom Anaphylaxis  . Dilaudid [Hydromorphone Hcl] Anaphylaxis and Hives    Has tolerated morphine since this reaction  . Other Anaphylaxis    Bandaid, Nuts, Peanut Oil   . Peanut Oil Anaphylaxis  . Senna Anaphylaxis and Hives  . Adhesive [Tape] Hives  and Other (See Comments)    Pulls skin off (use paper tape)  . Latex Hives   Prior to Admission medications   Medication Sig Start Date End Date Taking? Authorizing Provider  albuterol (PROVENTIL HFA;VENTOLIN HFA) 108 (  90 Base) MCG/ACT inhaler Inhale 1-2 puffs into the lungs every 6 (six) hours as needed for wheezing. 08/26/16  Yes Padgett, Rae Halsted, MD  ALPRAZolam (XANAX XR) 3 MG 24 hr tablet Take 3 mg by mouth 2 (two) times daily.   Yes [provider]  AZO-CRANBERRY PO Take 2 tablets by mouth daily as needed (UTI symptoms).   Yes [provider]  cloNIDine (CATAPRES) 0.1 MG tablet Take 0.1 mg by mouth 2 (two) times daily.   Yes [provider]  Crisaborole (EUCRISA) 2 % OINT Apply 1 application topically 2 (two) times daily. 08/26/16  Yes Padgett, Rae Halsted, MD  cyclobenzaprine (FLEXERIL) 10 MG tablet Take 10 mg by mouth 3 (three) times daily as needed for muscle spasms.   Yes [provider]  doxepin (SINEQUAN) 50 MG capsule Take 1 capsule (50 mg total) by mouth at bedtime. Patient taking differently: Take 50 mg by mouth 3 (three) times daily.  12/07/15  Yes Derrill Center, NP  DULoxetine (CYMBALTA) 60 MG capsule Take 60 mg by mouth 2 (two) times daily.   Yes [provider]  fexofenadine (ALLEGRA) 180 MG tablet Take 1 tablet (180 mg total) by mouth daily. 10/07/16  Yes Padgett, Rae Halsted, MD  fluticasone [facility] 50 MCG/ACT nasal spray Place 2 sprays into both nostrils daily. Patient taking differently: Place 2 sprays into both nostrils daily as needed for allergies or rhinitis.  08/26/16  Yes Kennith Gain, MD  fluticasone (FLOVENT HFA) 110 MCG/ACT inhaler Inhale 2 puffs into the lungs daily.   Yes [provider]  gemfibrozil (LOPID) 600 MG tablet Take 0.5 tablets (300 mg total) by mouth 2 (two) times daily before a meal. Patient taking differently: Take 600 mg by mouth 2 (two) times daily before a meal.   12/07/15  Yes Derrill Center, NP  HYDROcodone-acetaminophen (NORCO/VICODIN) 5-325 MG tablet Take 1-2 tablets by mouth every 6 (six) hours as needed for moderate pain (depends on pain level if takes 1-2 tablets).    Yes [provider]  hydrocortisone (ANUSOL-HC) 25 MG suppository Place 25 mg rectally 2 (two) times daily as needed.   Yes [provider]  ketorolac (ACULAR) 0.5 % ophthalmic solution Place 1 drop into both eyes 4 (four) times daily.   Yes [provider]  lamoTRIgine (LAMICTAL) 25 MG tablet Take 1 tablet (25 mg total) by mouth daily. Patient taking differently: Take 100 mg by mouth daily.  12/07/15  Yes Derrill Center, NP  medroxyPROGESTERone (DEPO-PROVERA) 150 MG/ML injection INJECT 1 VIAL INTRAMUSCULARLY EVERY 3 MONTHS IN OFFICE. 10/07/16  Yes Derrek Monaco A, NP  megestrol (MEGACE) 40 MG tablet TAKE 3 TABLETS DAILY FOR 5 DAYS, 2 TABLETS DAILY FOR 5 DAYS, THEN1 TABLET DAILY UNTIL BLEEDING STOPS. 10/07/16  Yes Booker, Royetta Crochet, CNM  mometasone (ELOCON) 0.1 % ointment Apply 1 application topically 2 (two) times daily as needed (eczema).   Yes [provider]  mometasone (NASONEX) 50 MCG/ACT nasal spray Place 2 sprays into the nose daily as needed (congestion).    Yes [provider]  montelukast (SINGULAIR) 10 MG tablet Take 1 tablet (10 mg total) by mouth at bedtime. 10/04/16  Yes Padgett, Rae Halsted, MD  NEXIUM 40 MG capsule TAKE 1 CAPSULE ONCE DAILY AT 12 NOON. Patient taking differently: TAKE 1 CAPSULE ONCE DAILY 09/16/16  Yes Setzer, Terri L, NP  nystatin (NYSTATIN) powder Apply topically 4 (four) times daily.   Yes [provider]  Olopatadine  HCl (PATADAY) 0.2 % SOLN Place 1 drop into both eyes daily as needed (itchy eyes). 08/26/16  Yes Padgett, Rae Halsted, MD  Plecanatide (TRULANCE) 3 MG TABS Take 3 mg by mouth every morning. 06/14/16  Yes Rehman, Mechele Dawley, MD  prednisoLONE acetate (PRED FORTE) 1 % ophthalmic  suspension Place 1 drop into the right eye 4 (four) times daily.   Yes [provider]  promethazine (PHENERGAN) 25 MG tablet Take 25 mg by mouth every 6 (six) hours as needed for nausea or vomiting.   Yes [provider]  propranolol (INDERAL) 10 MG tablet Take 10 mg by mouth 3 (three) times daily as needed (takes it twice daily and a 3rd dose if needed).    Yes [provider]  tiZANidine (ZANAFLEX) 4 MG tablet Take 4 mg by mouth every 8 (eight) hours as needed for muscle spasms.   Yes [provider]  triazolam (HALCION) 0.25 MG tablet Take 0.25 mg by mouth at bedtime.    Yes [provider]  Urea (ALUVEA) 39 % CREA Apply 1 application topically daily as needed (to heels).    Yes [provider]  budesonide-formoterol (SYMBICORT) 160-4.5 MCG/ACT inhaler Inhale 2 puffs into the lungs 2 (two) times daily. Patient not taking: Reported on 10/11/2016 10/07/16   Kennith Gain, MD  diclofenac (VOLTAREN) 75 MG EC tablet Take 75 mg by mouth 2 (two) times daily. Has stopped prior to procedure    [provider]  ibuprofen (ADVIL,MOTRIN) 600 MG tablet Take 1 tablet (600 mg total) by mouth every 6 (six) hours as needed. Patient not taking: Reported on 10/11/2016 09/08/16   Noemi Chapel, MD  oxyCODONE-acetaminophen (PERCOCET/ROXICET) 5-325 MG tablet Take 1-2 tablets by mouth every 4 (four) hours as needed. Patient not taking: Reported on 10/11/2016 09/06/16   Milton Ferguson, MD     All other systems have been reviewed and were otherwise negative with the exception of those mentioned in the HPI and as above.  Physical Exam: There were no vitals filed for this visit.  General: Alert, no acute distress Cardiovascular: No pedal edema Respiratory: No cyanosis, no use of accessory musculature Skin: No lesions in the area of chief complaint Neurologic: Sensation intact distally Psychiatric: Patient is competent for consent with normal mood and  affect Lymphatic: No axillary or cervical lymphadenopathy  MUSCULOSKELETAL: + SLR on the right  Assessment/Plan: Right leg pain Plan for Procedure(s): RIGHT SIDED LUMBAR 4-5 TRANSFORAMINAL LUMBAR FUSION INTERBODY FUSION WITH INSTRUMENTATION AND ALLOGRAFT   Sinclair Ship, MD 10/17/2016 7:54 AM

## 2016-10-20 ENCOUNTER — Ambulatory Visit (HOSPITAL_COMMUNITY): Payer: Medicaid Other

## 2016-10-20 ENCOUNTER — Encounter (HOSPITAL_COMMUNITY): Payer: Self-pay | Admitting: *Deleted

## 2016-10-20 ENCOUNTER — Ambulatory Visit (HOSPITAL_COMMUNITY): Payer: Medicaid Other | Admitting: Anesthesiology

## 2016-10-20 ENCOUNTER — Inpatient Hospital Stay (HOSPITAL_COMMUNITY)
Admission: RE | Admit: 2016-10-20 | Discharge: 2016-10-21 | DRG: 455 | Disposition: A | Discharge status: Home / Self Care | Payer: Medicaid Other | Source: Ambulatory Visit | Attending: Orthopedic Surgery | Admitting: Orthopedic Surgery

## 2016-10-20 ENCOUNTER — Ambulatory Visit (HOSPITAL_COMMUNITY): Payer: Medicaid Other | Admitting: Emergency Medicine

## 2016-10-20 ENCOUNTER — Encounter (HOSPITAL_COMMUNITY)
Admission: RE | Disposition: A | Discharge status: Home / Self Care | Payer: Self-pay | Source: Ambulatory Visit | Attending: Orthopedic Surgery

## 2016-10-20 DIAGNOSIS — J45909 Unspecified asthma, uncomplicated: Secondary | ICD-10-CM | POA: Diagnosis present

## 2016-10-20 DIAGNOSIS — F603 Borderline personality disorder: Secondary | ICD-10-CM | POA: Diagnosis present

## 2016-10-20 DIAGNOSIS — F909 Attention-deficit hyperactivity disorder, unspecified type: Secondary | ICD-10-CM | POA: Diagnosis present

## 2016-10-20 DIAGNOSIS — Z79899 Other long term (current) drug therapy: Secondary | ICD-10-CM

## 2016-10-20 DIAGNOSIS — K589 Irritable bowel syndrome without diarrhea: Secondary | ICD-10-CM | POA: Diagnosis present

## 2016-10-20 DIAGNOSIS — Z3202 Encounter for pregnancy test, result negative: Secondary | ICD-10-CM | POA: Diagnosis not present

## 2016-10-20 DIAGNOSIS — Z419 Encounter for procedure for purposes other than remedying health state, unspecified: Secondary | ICD-10-CM

## 2016-10-20 DIAGNOSIS — G4700 Insomnia, unspecified: Secondary | ICD-10-CM | POA: Diagnosis present

## 2016-10-20 DIAGNOSIS — M48061 Spinal stenosis, lumbar region without neurogenic claudication: Secondary | ICD-10-CM | POA: Diagnosis present

## 2016-10-20 DIAGNOSIS — K219 Gastro-esophageal reflux disease without esophagitis: Secondary | ICD-10-CM | POA: Diagnosis present

## 2016-10-20 DIAGNOSIS — F319 Bipolar disorder, unspecified: Secondary | ICD-10-CM | POA: Diagnosis present

## 2016-10-20 DIAGNOSIS — Z981 Arthrodesis status: Secondary | ICD-10-CM | POA: Diagnosis not present

## 2016-10-20 DIAGNOSIS — M5116 Intervertebral disc disorders with radiculopathy, lumbar region: Secondary | ICD-10-CM | POA: Diagnosis present

## 2016-10-20 DIAGNOSIS — I10 Essential (primary) hypertension: Secondary | ICD-10-CM | POA: Diagnosis present

## 2016-10-20 DIAGNOSIS — Z87891 Personal history of nicotine dependence: Secondary | ICD-10-CM

## 2016-10-20 DIAGNOSIS — F419 Anxiety disorder, unspecified: Secondary | ICD-10-CM | POA: Diagnosis present

## 2016-10-20 DIAGNOSIS — M545 Low back pain: Secondary | ICD-10-CM | POA: Diagnosis present

## 2016-10-20 DIAGNOSIS — G8929 Other chronic pain: Secondary | ICD-10-CM | POA: Diagnosis present

## 2016-10-20 DIAGNOSIS — M5410 Radiculopathy, site unspecified: Secondary | ICD-10-CM | POA: Diagnosis present

## 2016-10-20 DIAGNOSIS — Z3042 Encounter for surveillance of injectable contraceptive: Secondary | ICD-10-CM | POA: Diagnosis not present

## 2016-10-20 SURGERY — POSTERIOR LUMBAR FUSION 1 LEVEL
Anesthesia: General | Site: Back | Laterality: Right

## 2016-10-20 MED ORDER — PANTOPRAZOLE SODIUM 40 MG PO TBEC
80.0000 mg | DELAYED_RELEASE_TABLET | Freq: Every day | ORAL | Status: DC
  Administered 2016-10-21: 80 mg via ORAL
  Filled 2016-10-20: qty 2

## 2016-10-20 MED ORDER — FLEET ENEMA 7-19 GM/118ML RE ENEM: 1.0000 | ENEMA | Freq: Once | RECTAL | Status: DC | PRN

## 2016-10-20 MED ORDER — LORATADINE 10 MG PO TABS
10.0000 mg | ORAL_TABLET | Freq: Every day | ORAL | Status: DC
  Administered 2016-10-21: 10 mg via ORAL
  Filled 2016-10-20: qty 1

## 2016-10-20 MED ORDER — SURGIFOAM 100 EX MISC
CUTANEOUS | Status: DC | PRN
  Administered 2016-10-20: 11:00:00 via TOPICAL

## 2016-10-20 MED ORDER — BUPIVACAINE LIPOSOME 1.3 % IJ SUSP
20.0000 mL | INTRAMUSCULAR | Status: DC
  Filled 2016-10-20: qty 20

## 2016-10-20 MED ORDER — PROPOFOL 10 MG/ML IV BOLUS
INTRAVENOUS | Status: AC
  Filled 2016-10-20: qty 20

## 2016-10-20 MED ORDER — THROMBIN 20000 UNITS EX SOLR
CUTANEOUS | Status: AC
  Filled 2016-10-20: qty 20000

## 2016-10-20 MED ORDER — LACTATED RINGERS IV SOLN
INTRAVENOUS | Status: DC
  Administered 2016-10-20 (×3): via INTRAVENOUS

## 2016-10-20 MED ORDER — ZOLPIDEM TARTRATE 5 MG PO TABS: 5.0000 mg | ORAL_TABLET | Freq: Every evening | ORAL | Status: DC | PRN

## 2016-10-20 MED ORDER — ROCURONIUM BROMIDE 10 MG/ML (PF) SYRINGE
PREFILLED_SYRINGE | INTRAVENOUS | Status: AC
  Filled 2016-10-20: qty 5

## 2016-10-20 MED ORDER — MORPHINE SULFATE (PF) 4 MG/ML IV SOLN: 1.0000 mg | INTRAVENOUS | Status: DC | PRN

## 2016-10-20 MED ORDER — ONDANSETRON HCL 4 MG/2ML IJ SOLN
INTRAMUSCULAR | Status: DC | PRN
  Administered 2016-10-20: 4 mg via INTRAVENOUS

## 2016-10-20 MED ORDER — DIPHENHYDRAMINE HCL 50 MG/ML IJ SOLN
INTRAMUSCULAR | Status: DC | PRN
  Administered 2016-10-20: 12.5 mg via INTRAVENOUS

## 2016-10-20 MED ORDER — PHENYLEPHRINE HCL 10 MG/ML IJ SOLN
INTRAMUSCULAR | Status: DC | PRN
  Administered 2016-10-20: 25 ug/min via INTRAVENOUS

## 2016-10-20 MED ORDER — MOMETASONE FUROATE 0.1 % EX OINT: 1.0000 "application " | TOPICAL_OINTMENT | Freq: Two times a day (BID) | CUTANEOUS | Status: DC | PRN

## 2016-10-20 MED ORDER — METHYLENE BLUE 0.5 % INJ SOLN
INTRAVENOUS | Status: AC
  Filled 2016-10-20: qty 10

## 2016-10-20 MED ORDER — HYDROCORTISONE ACETATE 25 MG RE SUPP
25.0000 mg | Freq: Two times a day (BID) | RECTAL | Status: DC | PRN
  Filled 2016-10-20: qty 1

## 2016-10-20 MED ORDER — MOMETASONE FURO-FORMOTEROL FUM 200-5 MCG/ACT IN AERO
2.0000 | INHALATION_SPRAY | Freq: Two times a day (BID) | RESPIRATORY_TRACT | Status: DC
  Administered 2016-10-20: 2 via RESPIRATORY_TRACT
  Filled 2016-10-20: qty 8.8

## 2016-10-20 MED ORDER — LIDOCAINE 2% (20 MG/ML) 5 ML SYRINGE
INTRAMUSCULAR | Status: AC
  Filled 2016-10-20: qty 5

## 2016-10-20 MED ORDER — SUGAMMADEX SODIUM 200 MG/2ML IV SOLN
INTRAVENOUS | Status: AC
  Filled 2016-10-20: qty 2

## 2016-10-20 MED ORDER — ONDANSETRON HCL 4 MG/2ML IJ SOLN: 4.0000 mg | Freq: Four times a day (QID) | INTRAMUSCULAR | Status: DC | PRN

## 2016-10-20 MED ORDER — POLYETHYLENE GLYCOL 3350 17 G PO PACK: 17.0000 g | PACK | Freq: Every day | ORAL | Status: DC | PRN

## 2016-10-20 MED ORDER — POTASSIUM CHLORIDE IN NACL 20-0.9 MEQ/L-% IV SOLN
INTRAVENOUS | Status: DC
  Administered 2016-10-20: 18:00:00 via INTRAVENOUS
  Filled 2016-10-20 (×2): qty 1000

## 2016-10-20 MED ORDER — ALBUMIN HUMAN 5 % IV SOLN
INTRAVENOUS | Status: DC | PRN
  Administered 2016-10-20: 14:00:00 via INTRAVENOUS

## 2016-10-20 MED ORDER — BUDESONIDE 0.25 MG/2ML IN SUSP
0.2500 mg | Freq: Two times a day (BID) | RESPIRATORY_TRACT | Status: DC
  Administered 2016-10-20 – 2016-10-21 (×2): 0.25 mg via RESPIRATORY_TRACT
  Filled 2016-10-20 (×3): qty 2

## 2016-10-20 MED ORDER — FENTANYL CITRATE (PF) 100 MCG/2ML IJ SOLN
INTRAMUSCULAR | Status: DC | PRN
  Administered 2016-10-20: 50 ug via INTRAVENOUS
  Administered 2016-10-20 (×2): 25 ug via INTRAVENOUS
  Administered 2016-10-20: 100 ug via INTRAVENOUS
  Administered 2016-10-20: 50 ug via INTRAVENOUS
  Administered 2016-10-20: 100 ug via INTRAVENOUS
  Administered 2016-10-20 (×4): 25 ug via INTRAVENOUS
  Administered 2016-10-20: 50 ug via INTRAVENOUS

## 2016-10-20 MED ORDER — MEDROXYPROGESTERONE ACETATE 150 MG/ML IM SUSP: 150.0000 mg | Freq: Once | INTRAMUSCULAR | Status: DC

## 2016-10-20 MED ORDER — SUCCINYLCHOLINE CHLORIDE 200 MG/10ML IV SOSY
PREFILLED_SYRINGE | INTRAVENOUS | Status: AC
  Filled 2016-10-20: qty 10

## 2016-10-20 MED ORDER — PROPOFOL 10 MG/ML IV BOLUS
INTRAVENOUS | Status: AC
  Filled 2016-10-20: qty 40

## 2016-10-20 MED ORDER — CEFAZOLIN SODIUM-DEXTROSE 2-4 GM/100ML-% IV SOLN
2.0000 g | INTRAVENOUS | Status: AC
  Administered 2016-10-20: 2 g via INTRAVENOUS
  Filled 2016-10-20: qty 100

## 2016-10-20 MED ORDER — POVIDONE-IODINE 7.5 % EX SOLN
Freq: Once | CUTANEOUS | Status: DC
  Filled 2016-10-20: qty 118

## 2016-10-20 MED ORDER — PHENYLEPHRINE HCL 10 MG/ML IJ SOLN
INTRAMUSCULAR | Status: DC | PRN
  Administered 2016-10-20 (×2): 80 ug via INTRAVENOUS

## 2016-10-20 MED ORDER — PHENYLEPHRINE 40 MCG/ML (10ML) SYRINGE FOR IV PUSH (FOR BLOOD PRESSURE SUPPORT)
PREFILLED_SYRINGE | INTRAVENOUS | Status: AC
  Filled 2016-10-20: qty 10

## 2016-10-20 MED ORDER — SODIUM CHLORIDE 0.9% FLUSH: 3.0000 mL | INTRAVENOUS | Status: DC | PRN

## 2016-10-20 MED ORDER — SODIUM CHLORIDE FLUSH 0.9 % IV SOLN
INTRAVENOUS | Status: AC
  Filled 2016-10-20: qty 10

## 2016-10-20 MED ORDER — ACETAMINOPHEN 650 MG RE SUPP: 650.0000 mg | RECTAL | Status: DC | PRN

## 2016-10-20 MED ORDER — ALUM & MAG HYDROXIDE-SIMETH 200-200-20 MG/5ML PO SUSP: 30.0000 mL | Freq: Four times a day (QID) | ORAL | Status: DC | PRN

## 2016-10-20 MED ORDER — BUPIVACAINE LIPOSOME 1.3 % IJ SUSP
INTRAMUSCULAR | Status: DC | PRN
  Administered 2016-10-20: 20 mL

## 2016-10-20 MED ORDER — FENTANYL CITRATE (PF) 250 MCG/5ML IJ SOLN
INTRAMUSCULAR | Status: AC
  Filled 2016-10-20: qty 5

## 2016-10-20 MED ORDER — ONDANSETRON HCL 4 MG/2ML IJ SOLN
INTRAMUSCULAR | Status: AC
  Filled 2016-10-20: qty 2

## 2016-10-20 MED ORDER — DEXAMETHASONE SODIUM PHOSPHATE 10 MG/ML IJ SOLN
INTRAMUSCULAR | Status: DC | PRN
  Administered 2016-10-20: 10 mg via INTRAVENOUS

## 2016-10-20 MED ORDER — THROMBIN 20000 UNITS EX KIT
PACK | CUTANEOUS | Status: DC | PRN
  Administered 2016-10-20: 20000 [IU] via TOPICAL

## 2016-10-20 MED ORDER — ROCURONIUM BROMIDE 100 MG/10ML IV SOLN
INTRAVENOUS | Status: DC | PRN
  Administered 2016-10-20: 40 mg via INTRAVENOUS
  Administered 2016-10-20 (×2): 20 mg via INTRAVENOUS

## 2016-10-20 MED ORDER — SODIUM CHLORIDE 0.9 % IJ SOLN
INTRAMUSCULAR | Status: AC
Start: 2016-10-20 — End: 2016-10-20
  Filled 2016-10-20: qty 10

## 2016-10-20 MED ORDER — FENTANYL CITRATE (PF) 100 MCG/2ML IJ SOLN
INTRAMUSCULAR | Status: AC
  Administered 2016-10-20: 50 ug via INTRAVENOUS
  Filled 2016-10-20: qty 2

## 2016-10-20 MED ORDER — EPINEPHRINE PF 1 MG/ML IJ SOLN
INTRAMUSCULAR | Status: AC
  Filled 2016-10-20: qty 2

## 2016-10-20 MED ORDER — DOXEPIN HCL 25 MG PO CAPS
50.0000 mg | ORAL_CAPSULE | Freq: Three times a day (TID) | ORAL | Status: DC
Start: 2016-10-20 — End: 2016-10-21
  Administered 2016-10-20 – 2016-10-21 (×2): 50 mg via ORAL
  Filled 2016-10-20 (×2): qty 2

## 2016-10-20 MED ORDER — EPINEPHRINE PF 1 MG/ML IJ SOLN
INTRAMUSCULAR | Status: DC | PRN
  Administered 2016-10-20: .15 mL via SUBCUTANEOUS

## 2016-10-20 MED ORDER — DULOXETINE HCL 60 MG PO CPEP
60.0000 mg | ORAL_CAPSULE | Freq: Two times a day (BID) | ORAL | Status: DC
  Administered 2016-10-20 – 2016-10-21 (×2): 60 mg via ORAL
  Filled 2016-10-20 (×2): qty 1

## 2016-10-20 MED ORDER — MENTHOL 3 MG MT LOZG: 1.0000 | LOZENGE | OROMUCOSAL | Status: DC | PRN

## 2016-10-20 MED ORDER — MOMETASONE FUROATE 0.1 % EX CREA
TOPICAL_CREAM | Freq: Two times a day (BID) | CUTANEOUS | Status: DC | PRN
  Filled 2016-10-20: qty 15

## 2016-10-20 MED ORDER — KETOROLAC TROMETHAMINE 0.5 % OP SOLN
1.0000 [drp] | Freq: Four times a day (QID) | OPHTHALMIC | Status: DC
  Administered 2016-10-20 – 2016-10-21 (×3): 1 [drp] via OPHTHALMIC
  Filled 2016-10-20: qty 3

## 2016-10-20 MED ORDER — SUGAMMADEX SODIUM 200 MG/2ML IV SOLN
INTRAVENOUS | Status: DC | PRN
  Administered 2016-10-20: 100 mg via INTRAVENOUS

## 2016-10-20 MED ORDER — PROPOFOL 10 MG/ML IV BOLUS
INTRAVENOUS | Status: DC | PRN
  Administered 2016-10-20: 200 mg via INTRAVENOUS
  Administered 2016-10-20 (×3): 50 mg via INTRAVENOUS

## 2016-10-20 MED ORDER — OXYCODONE-ACETAMINOPHEN 5-325 MG PO TABS
1.0000 | ORAL_TABLET | ORAL | Status: DC | PRN
  Administered 2016-10-20 – 2016-10-21 (×3): 2 via ORAL
  Filled 2016-10-20 (×2): qty 2

## 2016-10-20 MED ORDER — PROPRANOLOL HCL 10 MG PO TABS: 10.0000 mg | ORAL_TABLET | Freq: Three times a day (TID) | ORAL | Status: DC | PRN

## 2016-10-20 MED ORDER — TRIAZOLAM 0.125 MG PO TABS
0.2500 mg | ORAL_TABLET | Freq: Every day | ORAL | Status: DC
  Administered 2016-10-20: 0.25 mg via ORAL
  Filled 2016-10-20: qty 2

## 2016-10-20 MED ORDER — OXYCODONE-ACETAMINOPHEN 5-325 MG PO TABS
ORAL_TABLET | ORAL | Status: AC
  Administered 2016-10-21: 2 via ORAL
  Filled 2016-10-20: qty 2

## 2016-10-20 MED ORDER — CLONIDINE HCL 0.1 MG PO TABS
0.1000 mg | ORAL_TABLET | Freq: Two times a day (BID) | ORAL | Status: DC
  Administered 2016-10-20 – 2016-10-21 (×2): 0.1 mg via ORAL
  Filled 2016-10-20 (×2): qty 1

## 2016-10-20 MED ORDER — ACETAMINOPHEN 325 MG PO TABS: 650.0000 mg | ORAL_TABLET | ORAL | Status: DC | PRN

## 2016-10-20 MED ORDER — BUPIVACAINE HCL (PF) 0.25 % IJ SOLN
INTRAMUSCULAR | Status: AC
  Filled 2016-10-20: qty 60

## 2016-10-20 MED ORDER — PANTOPRAZOLE SODIUM 40 MG IV SOLR: 40.0000 mg | Freq: Every day | INTRAVENOUS | Status: DC

## 2016-10-20 MED ORDER — FLUTICASONE PROPIONATE 50 MCG/ACT NA SUSP
2.0000 | Freq: Every day | NASAL | Status: DC | PRN
  Filled 2016-10-20: qty 16

## 2016-10-20 MED ORDER — FENTANYL CITRATE (PF) 100 MCG/2ML IJ SOLN
25.0000 ug | INTRAMUSCULAR | Status: DC | PRN
  Administered 2016-10-20: 50 ug via INTRAVENOUS

## 2016-10-20 MED ORDER — MONTELUKAST SODIUM 10 MG PO TABS
10.0000 mg | ORAL_TABLET | Freq: Every day | ORAL | Status: DC
  Administered 2016-10-20: 10 mg via ORAL
  Filled 2016-10-20: qty 1

## 2016-10-20 MED ORDER — DIAZEPAM 5 MG PO TABS
5.0000 mg | ORAL_TABLET | Freq: Four times a day (QID) | ORAL | Status: DC | PRN
  Administered 2016-10-20 – 2016-10-21 (×3): 5 mg via ORAL
  Filled 2016-10-20 (×2): qty 1

## 2016-10-20 MED ORDER — PROMETHAZINE HCL 25 MG PO TABS: 25.0000 mg | ORAL_TABLET | Freq: Four times a day (QID) | ORAL | Status: DC | PRN

## 2016-10-20 MED ORDER — LAMOTRIGINE 100 MG PO TABS
100.0000 mg | ORAL_TABLET | Freq: Every day | ORAL | Status: DC
  Administered 2016-10-21: 100 mg via ORAL
  Filled 2016-10-20: qty 1

## 2016-10-20 MED ORDER — PREDNISOLONE ACETATE 1 % OP SUSP
1.0000 [drp] | Freq: Four times a day (QID) | OPHTHALMIC | Status: DC
  Administered 2016-10-20 – 2016-10-21 (×2): 1 [drp] via OPHTHALMIC
  Filled 2016-10-20: qty 1

## 2016-10-20 MED ORDER — CEFAZOLIN SODIUM-DEXTROSE 2-4 GM/100ML-% IV SOLN
2.0000 g | Freq: Three times a day (TID) | INTRAVENOUS | Status: AC
  Administered 2016-10-20 – 2016-10-21 (×2): 2 g via INTRAVENOUS
  Filled 2016-10-20 (×2): qty 100

## 2016-10-20 MED ORDER — ALPRAZOLAM 0.5 MG PO TABS
2.0000 mg | ORAL_TABLET | Freq: Three times a day (TID) | ORAL | Status: DC
  Administered 2016-10-20 – 2016-10-21 (×2): 2 mg via ORAL
  Filled 2016-10-20 (×2): qty 4

## 2016-10-20 MED ORDER — MIDAZOLAM HCL 2 MG/2ML IJ SOLN
INTRAMUSCULAR | Status: AC
  Filled 2016-10-20: qty 2

## 2016-10-20 MED ORDER — LIDOCAINE HCL (CARDIAC) 20 MG/ML IV SOLN
INTRAVENOUS | Status: DC | PRN
  Administered 2016-10-20: 100 mg via INTRAVENOUS

## 2016-10-20 MED ORDER — ALBUTEROL SULFATE (2.5 MG/3ML) 0.083% IN NEBU: 2.5000 mg | INHALATION_SOLUTION | Freq: Four times a day (QID) | RESPIRATORY_TRACT | Status: DC | PRN

## 2016-10-20 MED ORDER — PLECANATIDE 3 MG PO TABS: 3.0000 mg | ORAL_TABLET | ORAL | Status: DC

## 2016-10-20 MED ORDER — 0.9 % SODIUM CHLORIDE (POUR BTL) OPTIME
TOPICAL | Status: DC | PRN
  Administered 2016-10-20 (×3): 1000 mL

## 2016-10-20 MED ORDER — SODIUM CHLORIDE 0.9% FLUSH
3.0000 mL | Freq: Two times a day (BID) | INTRAVENOUS | Status: DC
  Administered 2016-10-21: 3 mL via INTRAVENOUS

## 2016-10-20 MED ORDER — BUPIVACAINE HCL (PF) 0.25 % IJ SOLN
INTRAMUSCULAR | Status: DC | PRN
  Administered 2016-10-20: 8 mL
  Administered 2016-10-20: 20 mL

## 2016-10-20 MED ORDER — DOCUSATE SODIUM 100 MG PO CAPS
100.0000 mg | ORAL_CAPSULE | Freq: Two times a day (BID) | ORAL | Status: DC
  Filled 2016-10-20 (×2): qty 1

## 2016-10-20 MED ORDER — GEMFIBROZIL 600 MG PO TABS
600.0000 mg | ORAL_TABLET | Freq: Two times a day (BID) | ORAL | Status: DC
Start: 2016-10-20 — End: 2016-10-21
  Administered 2016-10-20 – 2016-10-21 (×2): 600 mg via ORAL
  Filled 2016-10-20 (×3): qty 1

## 2016-10-20 MED ORDER — BISACODYL 5 MG PO TBEC
5.0000 mg | DELAYED_RELEASE_TABLET | Freq: Every day | ORAL | Status: DC | PRN
  Administered 2016-10-21: 5 mg via ORAL
  Filled 2016-10-20: qty 1

## 2016-10-20 MED ORDER — DIAZEPAM 5 MG PO TABS
ORAL_TABLET | ORAL | Status: AC
Start: 2016-10-20 — End: 2016-10-21
  Administered 2016-10-21: 5 mg via ORAL
  Filled 2016-10-20: qty 1

## 2016-10-20 MED ORDER — ALPRAZOLAM ER 1 MG PO TB24: 3.0000 mg | ORAL_TABLET | Freq: Two times a day (BID) | ORAL | Status: DC

## 2016-10-20 MED ORDER — PHENYLEPHRINE HCL 10 MG/ML IJ SOLN
INTRAMUSCULAR | Status: AC
Start: 2016-10-20 — End: 2016-10-20
  Filled 2016-10-20: qty 1

## 2016-10-20 MED ORDER — FLUTICASONE PROPIONATE 50 MCG/ACT NA SUSP: 1.0000 | Freq: Every day | NASAL | Status: DC

## 2016-10-20 MED ORDER — SUCCINYLCHOLINE CHLORIDE 20 MG/ML IJ SOLN
INTRAMUSCULAR | Status: DC | PRN
  Administered 2016-10-20: 120 mg via INTRAVENOUS

## 2016-10-20 MED ORDER — PHENOL 1.4 % MT LIQD: 1.0000 | OROMUCOSAL | Status: DC | PRN

## 2016-10-20 MED ORDER — SODIUM CHLORIDE 0.9 % IV SOLN: 250.0000 mL | INTRAVENOUS | Status: DC

## 2016-10-20 MED ORDER — MINERAL OIL LIGHT 100 % EX OIL
TOPICAL_OIL | CUTANEOUS | Status: AC
  Filled 2016-10-20: qty 25

## 2016-10-20 MED ORDER — ONDANSETRON HCL 4 MG PO TABS: 4.0000 mg | ORAL_TABLET | Freq: Four times a day (QID) | ORAL | Status: DC | PRN

## 2016-10-20 MED FILL — Sodium Chloride IV Soln 0.9%: INTRAVENOUS | Qty: 2000 | Status: AC

## 2016-10-20 MED FILL — Heparin Sodium (Porcine) Inj 1000 Unit/ML: INTRAMUSCULAR | Qty: 30 | Status: AC

## 2016-10-20 SURGICAL SUPPLY — 99 items
APL SKNCLS STERI-STRIP NONHPOA (GAUZE/BANDAGES/DRESSINGS) ×1
BENZOIN TINCTURE PRP APPL 2/3 (GAUZE/BANDAGES/DRESSINGS) ×2 IMPLANT
BIT DRILL 3.2 (BIT) ×2
BIT DRILL 65X3.2XQC STP NS (BIT) IMPLANT
BIT DRL 65X3.2XQC STP NS (BIT) ×1
BLADE CLIPPER SURG (BLADE) IMPLANT
BONE VIVIGEN FORMABLE 10CC (Bone Implant) ×2 IMPLANT
BUR PRESCISION 1.7 ELITE (BURR) ×2 IMPLANT
BUR ROUND PRECISION 4.0 (BURR) ×1 IMPLANT
BUR SABER RD CUTTING 3.0 (BURR) ×1 IMPLANT
CAGE CONCORDE BULLET 13-5 (Cage) ×1 IMPLANT
CARTRIDGE OIL MAESTRO DRILL (MISCELLANEOUS) ×1 IMPLANT
CONT SPEC 4OZ CLIKSEAL STRL BL (MISCELLANEOUS) ×2 IMPLANT
COVER MAYO STAND STRL (DRAPES) ×4 IMPLANT
COVER SURGICAL LIGHT HANDLE (MISCELLANEOUS) ×1 IMPLANT
DIFFUSER DRILL AIR PNEUMATIC (MISCELLANEOUS) ×2 IMPLANT
DRAIN CHANNEL 15F RND FF W/TCR (WOUND CARE) ×1 IMPLANT
DRAPE C-ARM 42X72 X-RAY (DRAPES) ×2 IMPLANT
DRAPE C-ARMOR (DRAPES) ×1 IMPLANT
DRAPE POUCH INSTRU U-SHP 10X18 (DRAPES) ×2 IMPLANT
DRAPE SURG 17X23 STRL (DRAPES) ×8 IMPLANT
DURAPREP 26ML APPLICATOR (WOUND CARE) ×2 IMPLANT
ELECT BLADE 4.0 EZ CLEAN MEGAD (MISCELLANEOUS) ×2
ELECT CAUTERY BLADE 6.4 (BLADE) ×2 IMPLANT
ELECT REM PT RETURN 9FT ADLT (ELECTROSURGICAL) ×2
ELECTRODE BLDE 4.0 EZ CLN MEGD (MISCELLANEOUS) ×1 IMPLANT
ELECTRODE REM PT RTRN 9FT ADLT (ELECTROSURGICAL) ×1 IMPLANT
EVACUATOR SILICONE 100CC (DRAIN) ×1 IMPLANT
FEE INTRAOP MONITOR IMPULS NCS (MISCELLANEOUS) IMPLANT
FILTER STRAW FLUID ASPIR (MISCELLANEOUS) ×1 IMPLANT
GAUZE SPONGE 4X4 12PLY STRL (GAUZE/BANDAGES/DRESSINGS) ×2 IMPLANT
GAUZE SPONGE 4X4 16PLY XRAY LF (GAUZE/BANDAGES/DRESSINGS) ×2 IMPLANT
GLOVE BIO SURGEON STRL SZ7 (GLOVE) ×1 IMPLANT
GLOVE BIO SURGEON STRL SZ8 (GLOVE) ×1 IMPLANT
GLOVE BIOGEL PI IND STRL 6.5 (GLOVE) IMPLANT
GLOVE BIOGEL PI IND STRL 7.0 (GLOVE) ×1 IMPLANT
GLOVE BIOGEL PI IND STRL 8 (GLOVE) ×1 IMPLANT
GLOVE BIOGEL PI INDICATOR 6.5 (GLOVE) ×1
GLOVE BIOGEL PI INDICATOR 7.0 (GLOVE) ×3
GLOVE BIOGEL PI INDICATOR 8 (GLOVE) ×1
GLOVE SURG SS PI 6.5 STRL IVOR (GLOVE) ×1 IMPLANT
GLOVE SURG SS PI 7.0 STRL IVOR (GLOVE) ×5 IMPLANT
GLOVE SURG SS PI 8.0 STRL IVOR (GLOVE) ×1 IMPLANT
GOWN STRL REUS W/ TWL LRG LVL3 (GOWN DISPOSABLE) ×2 IMPLANT
GOWN STRL REUS W/ TWL XL LVL3 (GOWN DISPOSABLE) ×1 IMPLANT
GOWN STRL REUS W/TWL LRG LVL3 (GOWN DISPOSABLE) ×8
GOWN STRL REUS W/TWL XL LVL3 (GOWN DISPOSABLE) ×2
GRAFT BNE MATRIX VG FRMBL L 10 (Bone Implant) IMPLANT
INTRAOP MONITOR FEE IMPULS NCS (MISCELLANEOUS) ×1
INTRAOP MONITOR FEE IMPULSE (MISCELLANEOUS) ×1
IV CATH 14GX2 1/4 (CATHETERS) ×2 IMPLANT
KIT BASIN OR (CUSTOM PROCEDURE TRAY) ×2 IMPLANT
KIT POSITION SURG JACKSON T1 (MISCELLANEOUS) ×2 IMPLANT
KIT ROOM TURNOVER OR (KITS) ×2 IMPLANT
MARKER SKIN DUAL TIP RULER LAB (MISCELLANEOUS) ×2 IMPLANT
NDL FILTER BLUNT 18X1 1/2 (NEEDLE) IMPLANT
NDL HYPO 25GX1X1/2 BEV (NEEDLE) ×1 IMPLANT
NDL SAFETY ECLIPSE 18X1.5 (NEEDLE) ×1 IMPLANT
NDL SPNL 18GX3.5 QUINCKE PK (NEEDLE) ×2 IMPLANT
NEEDLE 22X1 1/2 (OR ONLY) (NEEDLE) ×2 IMPLANT
NEEDLE FILTER BLUNT 18X 1/2SAF (NEEDLE) ×1
NEEDLE FILTER BLUNT 18X1 1/2 (NEEDLE) ×1 IMPLANT
NEEDLE HYPO 18GX1.5 SHARP (NEEDLE) ×2
NEEDLE HYPO 25GX1X1/2 BEV (NEEDLE) ×2 IMPLANT
NEEDLE SPNL 18GX3.5 QUINCKE PK (NEEDLE) ×4 IMPLANT
NS IRRIG 1000ML POUR BTL (IV SOLUTION) ×4 IMPLANT
OIL CARTRIDGE MAESTRO DRILL (MISCELLANEOUS) ×2
PACK LAMINECTOMY ORTHO (CUSTOM PROCEDURE TRAY) ×2 IMPLANT
PACK UNIVERSAL I (CUSTOM PROCEDURE TRAY) ×2 IMPLANT
PAD ARMBOARD 7.5X6 YLW CONV (MISCELLANEOUS) ×4 IMPLANT
PATTIES SURGICAL .5 X1 (DISPOSABLE) ×2 IMPLANT
PATTIES SURGICAL .5X1.5 (GAUZE/BANDAGES/DRESSINGS) ×1 IMPLANT
PROBE PEDCLE PROBE MAGSTM DISP (MISCELLANEOUS) ×1 IMPLANT
ROD EXPEDIUM PRE BENT 5.5X75 (Rod) ×2 IMPLANT
SCREW CORTICAL VIPER 7X35 (Screw) ×2 IMPLANT
SCREW SET SINGLE INNER (Screw) ×6 IMPLANT
SCREW VIPER CORT FIX 6X35 (Screw) ×2 IMPLANT
SPONGE INTESTINAL PEANUT (DISPOSABLE) ×2 IMPLANT
SPONGE SURGIFOAM ABS GEL 100 (HEMOSTASIS) ×2 IMPLANT
STRIP CLOSURE SKIN 1/2X4 (GAUZE/BANDAGES/DRESSINGS) ×3 IMPLANT
SURGIFLO W/THROMBIN 8M KIT (HEMOSTASIS) IMPLANT
SUT ETHILON 3 0 FSL (SUTURE) ×1 IMPLANT
SUT MNCRL AB 4-0 PS2 18 (SUTURE) ×2 IMPLANT
SUT VIC AB 0 CT1 18XCR BRD 8 (SUTURE) ×1 IMPLANT
SUT VIC AB 0 CT1 8-18 (SUTURE) ×2
SUT VIC AB 1 CT1 18XCR BRD 8 (SUTURE) ×1 IMPLANT
SUT VIC AB 1 CT1 8-18 (SUTURE) ×2
SUT VIC AB 2-0 CT2 18 VCP726D (SUTURE) ×3 IMPLANT
SYR 20CC LL (SYRINGE) ×2 IMPLANT
SYR BULB IRRIGATION 50ML (SYRINGE) ×2 IMPLANT
SYR CONTROL 10ML LL (SYRINGE) ×5 IMPLANT
SYR TB 1ML LUER SLIP (SYRINGE) ×3 IMPLANT
TAPE CLOTH SURG 4X10 WHT LF (GAUZE/BANDAGES/DRESSINGS) ×1 IMPLANT
TOWEL OR 17X24 6PK STRL BLUE (TOWEL DISPOSABLE) ×2 IMPLANT
TOWEL OR 17X26 10 PK STRL BLUE (TOWEL DISPOSABLE) ×2 IMPLANT
TRAY FOLEY BAG SILVER LF 16FR (CATHETERS) ×1 IMPLANT
TRAY FOLEY W/METER SILVER 16FR (SET/KITS/TRAYS/PACK) ×1 IMPLANT
WATER STERILE IRR 1000ML POUR (IV SOLUTION) ×2 IMPLANT
YANKAUER SUCT BULB TIP NO VENT (SUCTIONS) ×2 IMPLANT

## 2016-10-20 NOTE — Anesthesia Procedure Notes (Signed)
Procedure Name: Intubation Date/Time: 10/20/2016 10:46 AM Performed by: Scheryl Darter Pre-anesthesia Checklist: Patient identified, Emergency Drugs available, Suction available and Patient being monitored Patient Re-evaluated:Patient Re-evaluated prior to inductionOxygen Delivery Method: Circle System Utilized Preoxygenation: Pre-oxygenation with 100% oxygen Intubation Type: IV induction Ventilation: Mask ventilation without difficulty Laryngoscope Size: Miller and 2 Grade View: Grade I Tube type: Oral Tube size: 7.5 mm Number of attempts: 1 Airway Equipment and Method: Stylet and Oral airway Placement Confirmation: ETT inserted through vocal cords under direct vision,  positive ETCO2 and breath sounds checked- equal and bilateral Secured at: 22 cm Tube secured with: Tape Dental Injury: Teeth and Oropharynx as per pre-operative assessment

## 2016-10-20 NOTE — Progress Notes (Signed)
Ear rings noted to both ears. Pt states she wants to leave them in and place a cotton ball over them. Instructed pt that we need to remove, states the "last time I was here they let me leave them in". Encouraged her to ask Dr Tobias Alexander if this is ok. Note also left for Dr Tobias Alexander.

## 2016-10-20 NOTE — Progress Notes (Signed)
Instructed pt that she will need to remove her ear rings. Dr Tobias Alexander into see pt and explained the need to remove.

## 2016-10-20 NOTE — Anesthesia Postprocedure Evaluation (Addendum)
Anesthesia Post Note  Patient: Sharon Vazquez  Procedure(s) Performed: Procedure(s) (LRB): RIGHT SIDED LUMBAR FOUR-FIVE TRANSFORAMINAL LUMBAR FUSION INTERBODY FUSION WITH INSTRUMENTATION AND ALLOGRAFT (Right)  Patient location during evaluation: PACU Anesthesia Type: General Level of consciousness: sedated Pain management: pain level controlled Vital Signs Assessment: post-procedure vital signs reviewed and stable Respiratory status: spontaneous breathing and respiratory function stable Cardiovascular status: stable Anesthetic complications: no       Last Vitals:  Vitals:   10/20/16 1600 10/20/16 1610  BP: 132/79 128/89  Pulse: (!) 109 (!) 111  Resp: (!) 22   Temp:  36.2 C    Last Pain:  Vitals:   10/20/16 1610  TempSrc:   PainSc: Asleep    LLE Motor Response: Purposeful movement;Responds to commands (10/20/16 1610)   RLE Motor Response: Purposeful movement;Responds to commands (10/20/16 1610)        [place]

## 2016-10-20 NOTE — Anesthesia Preprocedure Evaluation (Addendum)
Anesthesia Evaluation  Patient identified by MRN, date of birth, ID band Patient awake    Reviewed: Allergy & Precautions, NPO status , Patient's Chart, lab work & pertinent test results  History of Anesthesia Complications Negative for: history of anesthetic complications  Airway Mallampati: II  TM Distance: >3 FB Neck ROM: Full    Dental  (+) Teeth Intact, Dental Advisory Given   Pulmonary shortness of breath, Current Smoker, former smoker,    breath sounds clear to auscultation       Cardiovascular hypertension,  Rhythm:Regular Rate:Normal     Neuro/Psych  Headaches, PSYCHIATRIC DISORDERS Anxiety Depression Bipolar Disorder  Neuromuscular disease    GI/Hepatic Neg liver ROS, GERD  Medicated,  Endo/Other  negative endocrine ROS  Renal/GU negative Renal ROS  negative genitourinary   Musculoskeletal  (+) Arthritis , Osteoarthritis,    Abdominal   Peds negative pediatric ROS (+)  Hematology negative hematology ROS (+)   Anesthesia Other Findings   Reproductive/Obstetrics negative OB ROS                             Lab Results  Component Value Date   WBC 9.7 10/12/2016   HGB 12.4 10/12/2016   HCT 36.0 10/12/2016   MCV 88.2 10/12/2016   PLT 453 (H) 10/12/2016   Lab Results  Component Value Date   CREATININE 0.91 10/12/2016   BUN 5 (L) 10/12/2016   NA 138 10/12/2016   K 3.9 10/12/2016   CL 104 10/12/2016   CO2 25 10/12/2016   Lab Results  Component Value Date   INR 1.11 10/12/2016   INR 0.92 03/30/2016   02/2016 EKG: sinus tachycardia.  Anesthesia Physical  Anesthesia Plan  ASA: II  Anesthesia Plan: General   Post-op Pain Management:    Induction: Intravenous  Airway Management Planned: Oral ETT  Additional Equipment:   Intra-op Plan:   Post-operative Plan: Extubation in OR  Informed Consent: I have reviewed the patients History and Physical, chart, labs and  discussed the procedure including the risks, benefits and alternatives for the proposed anesthesia with the patient or authorized representative who has indicated his/her understanding and acceptance.   Dental advisory given  Plan Discussed with: CRNA, Anesthesiologist and Surgeon  Anesthesia Plan Comments:        Anesthesia Quick Evaluation

## 2016-10-20 NOTE — Transfer of Care (Signed)
Immediate Anesthesia Transfer of Care Note  Patient: Sharon Vazquez  Procedure(s) Performed: Procedure(s): RIGHT SIDED LUMBAR FOUR-FIVE TRANSFORAMINAL LUMBAR FUSION INTERBODY FUSION WITH INSTRUMENTATION AND ALLOGRAFT (Right)  Patient Location: PACU  Anesthesia Type:General  Level of Consciousness: awake, alert  and oriented  Airway & Oxygen Therapy: Patient Spontanous Breathing and Patient connected to nasal cannula oxygen  Post-op Assessment: Report given to RN and Post -op Vital signs reviewed and stable  Post vital signs: Reviewed and stable  Last Vitals:  Vitals:   10/20/16 0810  BP: (!) 151/97  Pulse: 97  Resp: 20  Temp: 36.9 C    Last Pain:  Vitals:   10/20/16 0810  TempSrc: Oral  PainSc:       Patients Stated Pain Goal: 4 (73/40/37 0964)  Complications: No apparent anesthesia complications

## 2016-10-21 ENCOUNTER — Ambulatory Visit: Payer: Medicaid Other

## 2016-10-21 ENCOUNTER — Encounter: Payer: Self-pay | Admitting: *Deleted

## 2016-10-21 ENCOUNTER — Ambulatory Visit (INDEPENDENT_AMBULATORY_CARE_PROVIDER_SITE_OTHER): Payer: Medicaid Other | Admitting: *Deleted

## 2016-10-21 DIAGNOSIS — Z3042 Encounter for surveillance of injectable contraceptive: Secondary | ICD-10-CM | POA: Diagnosis not present

## 2016-10-21 DIAGNOSIS — Z3202 Encounter for pregnancy test, result negative: Secondary | ICD-10-CM

## 2016-10-21 LAB — POCT URINE PREGNANCY: Preg Test, Ur: NEGATIVE

## 2016-10-21 MED ORDER — MEDROXYPROGESTERONE ACETATE 150 MG/ML IM SUSP
150.0000 mg | Freq: Once | INTRAMUSCULAR | Status: AC
  Administered 2016-10-21: 150 mg via INTRAMUSCULAR

## 2016-10-21 NOTE — Evaluation (Signed)
Physical Therapy Evaluation and Discharge Summary Patient Details Name: Sharon Vazquez MRN: [id number] DOB: May 05, 1981 Today's Date: 10/21/2016   History of Present Illness  Pt is a 36 y/o female s/p RIGHT SIDED L4-5 TRANSFORAMINAL LUMBAR FUSION INTERBODY FUSION WITH INSTRUMENTATION AND ALLOGRAFT. Pt has a past medical history including ADHD; Anxiety; Arthritis; Asthma; Back spasm; Bipolar 1 disorder; Borderline personality disorder; Chronic back pain; Constipation; Contraceptive management (08/23/2013); Depression; Eczema; GERD; HSV-2 infection; chlamydia infection; Hypertension; IBS (irritable bowel syndrome); Insomnia; Internal hemorrhoids; Joint swelling; Mental disorder; Obesity; Panic disorder; Sciatica; Shortness of breath; Urinary urgency; bunion removal; Lumbar laminectomy/decompression microdiscectomy (Left, 07/03/2013); Lumbar laminectomy/decompression microdiscectomy (Left, 11/29/2013); Back surgery; Spinal cord stimulator trial (04/23/2014); Spinal cord stimulator insertion (06/13/2014); Spinal cord stimulator removal (12/17/2014); and Lumbar fusion (04/06/2016).  Clinical Impression  Pt presented sitting OOB in recliner chair, awake and willing to participate in therapy session. Prior to admission, pt reported that she was mod I with use of SPC to ambulate and independent with ADLs. Pt ambulated in hallway with RW and supervision. She also successfully completed stair training this session as well. No further acute PT needs available at this time. PT signing off.    Follow Up Recommendations No PT follow up    Equipment Recommendations  Rolling walker with 5" wheels;3in1 (PT)    Recommendations for Other Services       Precautions / Restrictions Precautions Precautions: Back Precaution Booklet Issued: No Precaution Comments: OT provided handout Required Braces or Orthoses: Spinal Brace Spinal Brace: Applied in sitting position;Applied in standing position Restrictions Weight  Bearing Restrictions: No      Mobility  Bed Mobility               General bed mobility comments: Pt sitting OOB in recliner when PT entered room  Transfers Overall transfer level: Needs assistance Equipment used: Rolling walker (2 wheeled) Transfers: Sit to/from Stand Sit to Stand: Supervision         General transfer comment: good technique, supervision for safety  Ambulation/Gait Ambulation/Gait assistance: Supervision Ambulation Distance (Feet): 200 Feet Assistive device: Rolling walker (2 wheeled) Gait Pattern/deviations: Step-through pattern;Decreased step length - right;Decreased step length - left;Decreased stride length Gait velocity: decreased Gait velocity interpretation: Below normal speed for age/gender General Gait Details: no instability or LOB, supervision for safety  Stairs Stairs: Yes Stairs assistance: Min guard Stair Management: One rail Right;Step to pattern;Forwards Number of Stairs: 4 General stair comments: no instability or LOB  Wheelchair Mobility    Modified Rankin (Stroke Patients Only)       Balance Overall balance assessment: Needs assistance Sitting-balance support: No upper extremity supported;Feet supported Sitting balance-Leahy Scale: Good     Standing balance support: During functional activity;No upper extremity supported Standing balance-Leahy Scale: Fair Standing balance comment: pt able to wash hands at sink after toileting                             Pertinent Vitals/Pain Pain Assessment: 0-10 Pain Score: 10-Worst pain ever Pain Location: incision site Pain Descriptors / Indicators: Throbbing;Spasm;Sore Pain Intervention(s): Monitored during session;Repositioned    Home Living Family/patient expects to be discharged to:: Private residence Living Arrangements: Alone (friend staying with her) Available Help at Discharge: Family;Friend(s);Available 24 hours/day Type of Home: House Home Access:  Stairs to enter Entrance Stairs-Rails: None Entrance Stairs-Number of Steps: 3 Home Layout: One level Home Equipment: Walker - standard;Cane - single point;Shower seat  Prior Function Level of Independence: Independent with assistive device(s)         Comments: independent in ADL/IADL; used a SPC for ambulation     Hand Dominance   Dominant Hand: Right    Extremity/Trunk Assessment   Upper Extremity Assessment Upper Extremity Assessment: Defer to OT evaluation    Lower Extremity Assessment Lower Extremity Assessment: Overall WFL for tasks assessed    Cervical / Trunk Assessment Cervical / Trunk Assessment: Other exceptions Cervical / Trunk Exceptions: s/p back surgery  Communication   Communication: No difficulties  Cognition Arousal/Alertness: Awake/alert Behavior During Therapy: WFL for tasks assessed/performed;Anxious Overall Cognitive Status: Within Functional Limits for tasks assessed                                 General Comments: anxiety and mental health at baseline      General Comments      Exercises     Assessment/Plan    PT Assessment Patent does not need any further PT services  PT Problem List         PT Treatment Interventions      PT Goals (Current goals can be found in the Care Plan section)  Acute Rehab PT Goals Patient Stated Goal: to get home and get back to her bed    Frequency     Barriers to discharge        Co-evaluation               AM-PAC PT "6 Clicks" Daily Activity  Outcome Measure Difficulty turning over in bed (including adjusting bedclothes, sheets and blankets)?: A Little Difficulty moving from lying on back to sitting on the side of the bed? : A Little Difficulty sitting down on and standing up from a chair with arms (e.g., wheelchair, bedside commode, etc,.)?: A Little Help needed moving to and from a bed to chair (including a wheelchair)?: A Little Help needed walking in hospital  room?: A Little Help needed climbing 3-5 steps with a railing? : A Little 6 Click Score: 18    End of Session Equipment Utilized During Treatment: Gait belt;Back brace Activity Tolerance: Patient tolerated treatment well Patient left: in bed;with call bell/phone within reach;Other (comment) (sitting EOB) Nurse Communication: Mobility status PT Visit Diagnosis: Other abnormalities of gait and mobility (R26.89);Pain Pain - part of body:  (back)    Time: 7106-2694 PT Time Calculation (min) (ACUTE ONLY): 18 min   Charges:   PT Evaluation $PT Eval Moderate Complexity: 1 Procedure     PT G Codes:        Sherie Don, PT, DPT [place] 10/21/2016, 10:36 AM

## 2016-10-21 NOTE — Op Note (Signed)
NAME:  Sharon, Vazquez NO.:  MEDICAL RECORD NO.:  [id number]  LOCATION:                                 FACILITY:  PHYSICIAN:  Phylliss Bob, MD      DATE OF BIRTH:  05-23-1981  DATE OF PROCEDURE:  10/20/2016                              OPERATIVE REPORT   PREOPERATIVE DIAGNOSES: 1. Adjacent segment disease, L4-5, below the patient's previous L3-4     fusion. 2. Right-sided lumbar radiculopathy, L5. 3. L4-5 spinal stenosis.  POSTOPERATIVE DIAGNOSES: 1. Adjacent segment disease, L4-5, below the patient's previous L3-4     fusion. 2. Right-sided lumbar radiculopathy, L5. 3. L4-5 spinal stenosis.  PROCEDURE: 1. Right-sided L4-5 transforaminal lumbar interbody fusion. 2. Left-sided L4-5 posterolateral fusion. 3. Lumbar decompression, L4-5, with decompression of the right L5     nerve. 4. Removal and reinsertion of spinal fixation, L4, bilateral. 5. Exploration of spinal fusion, L3-4 (the L3-4 level was noted to be     adequately fused). 6. Insertion of interbody device x1 (13 mm x 27 mm Concorde     intervertebral spacer, lordotic). 7. Intraoperative use of fluoroscopy. 8. Placement of posterior segmental instrumentation, bilaterally, L4-     5, to hook up with the patient's previously placed L3 and L4     instrumentation. 9. Use of local autograft. 10.Use of morselized allograft - ViviGen.  SURGEON:  Phylliss Bob, MD.  ASSISTANTPricilla Holm, PA-C.  ANESTHESIA:  General endotracheal anesthesia.  COMPLICATIONS:  None.  DISPOSITION:  Stable.  ESTIMATED BLOOD LOSS:  Minimal.  INDICATIONS FOR SURGERY:  Briefly, [name] is a pleasant 36 year old female, who is status post a previous L3-4 decompression and fusion. The indication for that particular procedure was for left leg pain.  She did very well after her surgery with resolution of her left leg pain. More recently, she began having substantial pain in the right leg.  An MRI did  reveal stenosis involving the L4-5 level, the level below her previous fusion.  She did continue to have ongoing debilitating pain, despite appropriate conservative treatment measures.  Given this, we did discuss proceeding with the procedure reflected above.  The patient was fully aware of the risks and limitations of surgery and did elect to proceed.  OPERATIVE DETAILS:  On Oct 20, 2016, the patient was brought to surgery and general endotracheal anesthesia was administered.  The patient was placed prone on a flat Jackson bed with a spinal frame.  Antibiotics were given.  The back was prepped and draped in the usual fashion and a time-out was performed.  A midline incision was then made in line with the patient's previous incision, and slightly extended superiorly and inferiorly.  The fascia was incised at the midline.  The paraspinal musculature was bluntly swept laterally on the right and left sides. The patient's previously placed L3 and L4 instrumentation were noted. The caps were uneventfully removed, as was the interconnecting rod.  At this point, I did meticulously explore the fusion across L3-4.  There was no motion noted across the facet joints.  A pushing and pulling maneuver was performed grabbing hold of the bilateral L3 and L4 pedicle  screws, but no motion was noted, which did confirm a successful L3-4 fusion.  Using AP and lateral fluoroscopy, I did cannulate the L5 pedicles using a medial to lateral cortical trajectory technique.  I tapped up to a 6 mm tap and a ball-tip probe was used to confirm that there was no cortical violation.  I then removed the bilateral L4 pedicle screws.  A ball-tip probe was used to confirm that there was no cortical violation of the pedicles of the pedicle screws, and no violation was noted.  I then used a 7 mm tap in order to upsize the screws for better fixation of the construct.  On the patient's left side, a 7 x 35 mm screw was placed at  L4 and a 6 x 35 mm screw was placed into the L5 pedicle.  A 75 mm interconnecting rod was placed and caps were placed.  Distraction was applied across the L4-5 intervertebral space and the caps were provisionally tightened.  On the right side, bone wax was placed into the L4 and L5 pedicle holes.  I then performed a full facetectomy on the right side.  The traversing right L5 nerve was identified and medially retracted.  Immediately ventral to the nerve was noted to be a prominent herniated disk fragment.  I did thoroughly decompress the right L5 nerve by removing all herniated fragments.  At this point, with an assistant continuing to hold medial retraction of the L5 nerve on the right, I did perform a thorough and complete L4-5 intervertebral diskectomy.  The endplates were then prepared, after which point the intervertebral space was liberally packed with allograft in the form of ViviGen, as well as autograft from the decompression.  A 13 mm, lordotic intervertebral spacer was also packed with allograft and autograft and tamped into position in the usual fashion.  I was very pleased with the press fit of the implant, and I was very pleased with the appearance of the implant on AP and lateral fluoroscopic images.  On the right, L4-L5 pedicle screws of the same diameter and length were placed.  The distraction was then released on the contralateral left side.  A high-speed bur was used to decorticate the posterior elements and posterolateral gutter on the left side across L4-5 and allograft and autograft was packed to help aid in the fusion success.  A 75 mm rod was then placed on the right.  Caps were then placed and a final locking procedure was performed on the right and left sides.  I was very pleased with the construct.  Minimal oozing was identified and therefore, I did place a #15 deep Blake drain, deep to the fascia.  The fascia was then closed using #1 Vicryl.  The subcutaneous  layer was closed using 2-0 Vicryl, followed by 4-0 Monocryl.  Benzoin and Steri-Strips were applied, followed by sterile dressing.  All instrument counts were correct at the termination of the procedure.  Of note, Pricilla Holm, PA-C, was my assistant throughout surgery, and did aid in retraction, suctioning, and closure.  Also of note, I did use neurologic monitoring throughout the surgery, and there was no abnormal EMG activity noted throughout the entire surgery from start to finish.     Phylliss Bob, MD     MD/MEDQ  D:  10/20/2016  T:  10/21/2016  Job:  [id number]

## 2016-10-21 NOTE — Progress Notes (Signed)
    Patient doing well Patient denies R leg pain + expected LBP   Physical Exam: Vitals:   10/21/16 0056 10/21/16 0537  BP: (!) 117/59 130/90  Pulse: (!) 121 (!) 120  Resp: 18 18  Temp: 98.7 F (37.1 C) 97.6 F (36.4 C)    Dressing in place NVI  Drain output: 30cc/10 hours  POD #1 s/p L4.5 decompression and fusion, doing well  - up with PT/OT, encourage ambulation - Percocet for pain, Valium for muscle spasms - likely d/c home later today - d/c drain

## 2016-10-21 NOTE — Progress Notes (Signed)
Depo provera 150mg  IM given in right deltoid with no complications. Pt to return in 12 weeks for next injection.

## 2016-10-21 NOTE — Evaluation (Signed)
Occupational Therapy Evaluation and Discharge Patient Details Name: Sharon Vazquez MRN: [id number] DOB: [birth year] Today's Date: 10/21/2016    History of Present Illness Pt is a 36 y/o female s/p RIGHT SIDED L4-5 TRANSFORAMINAL LUMBAR FUSION INTERBODY FUSION WITH INSTRUMENTATION AND ALLOGRAFT. Pt has a past medical history including ADHD; Anxiety; Arthritis; Asthma; Back spasm; Bipolar 1 disorder; Borderline personality disorder; Chronic back pain; Constipation; Contraceptive management (08/23/2013); Depression; Eczema; GERD; HSV-2 infection; chlamydia infection; Hypertension; IBS (irritable bowel syndrome); Insomnia; Internal hemorrhoids; Joint swelling; Mental disorder; Obesity; Panic disorder; Sciatica; Shortness of breath; Urinary urgency; bunion removal; Lumbar laminectomy/decompression microdiscectomy (Left, 07/03/2013); Lumbar laminectomy/decompression microdiscectomy (Left, 11/29/2013); Back surgery; Spinal cord stimulator trial (04/23/2014); Spinal cord stimulator insertion (06/13/2014); Spinal cord stimulator removal (12/17/2014); and Lumbar fusion (04/06/2016).   Clinical Impression   PTA Pt independent in ADL/IADL and used a SPC for ambulation. Pt currently mod A for LB dressing/bathing and supervision for other ADL, supervision for ambulation with RW. Back handout provided and reviewed adls in detail. Pt educated on: clothing between brace, never sleep in brace, set an alarm at night for medication, avoid sitting for long periods of time, correct bed positioning for sleeping, correct sequence for bed mobility, avoiding lifting more than 5 pounds and never wash directly over incision. All education is complete and patient indicates understanding. When discussing DME needs, Pt shared that due to body shape and mass, she will require a TOILET RISER instead of 3 in 1 to assist with toileting. She already has a shower chair, and this riser will best fit her needs and assist in increased independence  and safety while completing toileting. Discussed handles for assist with sit <> stand, and she declined. No further OT needs at this time. Thank you for this referral. OT to sign off.       Follow Up Recommendations  DC plan and follow up therapy as arranged by surgeon;Supervision/Assistance - 24 hour    Equipment Recommendations  Toilet riser    Recommendations for Other Services       Precautions / Restrictions Precautions Precautions: Back Precaution Booklet Issued: Yes (comment) Precaution Comments: handout reviewed in full Required Braces or Orthoses: Spinal Brace Spinal Brace: Applied in sitting position;Applied in standing position Restrictions Weight Bearing Restrictions: No      Mobility Bed Mobility               General bed mobility comments: Pt sitting OOB in recliner when OT entered room  Transfers Overall transfer level: Needs assistance Equipment used: Rolling walker (2 wheeled) Transfers: Sit to/from Stand Sit to Stand: Supervision         General transfer comment: good hand placement    Balance Overall balance assessment: Needs assistance Sitting-balance support: No upper extremity supported;Feet supported Sitting balance-Leahy Scale: Normal     Standing balance support: Bilateral upper extremity supported;During functional activity Standing balance-Leahy Scale: Fair Standing balance comment: reliant on RW for balance and pain management                           ADL either performed or assessed with clinical judgement   ADL Overall ADL's : Needs assistance/impaired Eating/Feeding: Modified independent;Sitting Eating/Feeding Details (indicate cue type and reason): finishing up breakfast when OT entered the room Grooming: Supervision/safety;Standing;Wash/dry face;Wash/dry hands (declined oral care) Grooming Details (indicate cue type and reason): Pt familiar with compensatory strategies from previous surgeries (cup method,  setting up on right to prevent twisting) Upper Body  Bathing: Supervision/ safety   Lower Body Bathing: Supervison/ safety;Sitting/lateral leans   Upper Body Dressing : Supervision/safety Upper Body Dressing Details (indicate cue type and reason): Pt able to don brace without assist Lower Body Dressing: Moderate assistance;Cueing for back precautions;Sit to/from stand   Toilet Transfer: Supervision/safety;Ambulation;RW Toilet Transfer Details (indicate cue type and reason): simulated     Tub/ Shower Transfer: Tub transfer;Min guard;Ambulation;Shower Technical sales engineer Details (indicate cue type and reason): reviewed method with pt, reinforced use of wall for stability. Pt comfotable with shower chair Functional mobility during ADLs: Supervision/safety;Rolling walker General ADL Comments: Reviewed compensatory and safety strategies with Pt, Pt familiar due to previous surgeries     Vision Patient Visual Report: No change from baseline Vision Assessment?: No apparent visual deficits     Perception     Praxis      Pertinent Vitals/Pain Pain Assessment: 0-10 Pain Score: 10-Worst pain ever Pain Location: incision site Pain Descriptors / Indicators: Throbbing;Spasm;Sore Pain Intervention(s): Limited activity within patient's tolerance;Monitored during session;Patient requesting pain meds-RN notified     Hand Dominance Right   Extremity/Trunk Assessment Upper Extremity Assessment Upper Extremity Assessment: Overall WFL for tasks assessed   Lower Extremity Assessment Lower Extremity Assessment: Defer to PT evaluation   Cervical / Trunk Assessment Cervical / Trunk Assessment: Other exceptions Cervical / Trunk Exceptions: s/p back surgery   Communication Communication Communication: No difficulties   Cognition Arousal/Alertness: Awake/alert Behavior During Therapy: WFL for tasks assessed/performed;Anxious Overall Cognitive Status: Within Functional  Limits for tasks assessed                                 General Comments: anxiety and mental health at baseline   General Comments       Exercises     Shoulder Instructions      Home Living Family/patient expects to be discharged to:: Private residence Living Arrangements: Alone (friend staying with her) Available Help at Discharge: Family;Friend(s);Available 24 hours/day Type of Home: House Home Access: Stairs to enter [employer] of Steps: 3 Entrance Stairs-Rails: None Home Layout: One level     Bathroom Shower/Tub: Teacher, early years/pre: Standard Bathroom Accessibility: Yes How Accessible: Accessible via walker Home Equipment: Walker - standard;Cane - single point;Shower seat          Prior Functioning/Environment Level of Independence: Independent with assistive device(s)        Comments: independent in ADL/IADL; used a SPC for ambulation        OT Problem List: Decreased activity tolerance;Impaired balance (sitting and/or standing);Obesity;Pain;Decreased knowledge of precautions      OT Treatment/Interventions:      OT Goals(Current goals can be found in the care plan section) Acute Rehab OT Goals Patient Stated Goal: to get home and get back to her bed OT Goal Formulation: With patient Time For Goal Achievement: 11/03/16 Potential to Achieve Goals: Good  OT Frequency:     Barriers to D/C:            Co-evaluation              AM-PAC PT "6 Clicks" Daily Activity     Outcome Measure Help from another person eating meals?: None Help from another person taking care of personal grooming?: None Help from another person toileting, which includes using toliet, bedpan, or urinal?: None Help from another person bathing (including washing, rinsing, drying)?: A Little Help from another person to  put on and taking off regular upper body clothing?: None Help from another person to put on and taking off regular  lower body clothing?: A Little 6 Click Score: 22   End of Session Equipment Utilized During Treatment: Gait belt;Rolling walker;Back brace Nurse Communication: Mobility status;Patient requests pain meds;Other (comment) (Necessary DME)  Activity Tolerance: Patient tolerated treatment well Patient left: in chair;with call bell/phone within reach;Other (comment) (beginning session with PT)  OT Visit Diagnosis: Unsteadiness on feet (R26.81);Pain Pain - Right/Left: Right Pain - part of body: Leg (back)                Time: 6811-5726 OT Time Calculation (min): 15 min Charges:  OT General Charges $OT Visit: 1 Procedure OT Evaluation $OT Eval Moderate Complexity: 1 Procedure G-Codes:     Hulda Humphrey OTR/L [place] 10/21/2016, 9:02 AM

## 2016-10-21 NOTE — Progress Notes (Signed)
Pt d/c to home by car with family. Assessment stable. Prescriptions given. All questions answered. 

## 2016-10-26 ENCOUNTER — Telehealth (INDEPENDENT_AMBULATORY_CARE_PROVIDER_SITE_OTHER): Payer: Self-pay | Admitting: Internal Medicine

## 2016-10-26 NOTE — Telephone Encounter (Signed)
Patient called back, stated that she spoke to her case worker and she stated that she should have full Medicaid coverage and it should show up tomorrow.  [phone]

## 2016-10-26 NOTE — Telephone Encounter (Signed)
Patient called, left a message that she has an appointment with Dr. Laural Golden for 12/13/16.  She stated that she can't wait that long and does not want to see Terri.  She stated that she's having trouble with the Trulance that she's on.  I did called the patient back and left her a message that I do not have an earlier appointment with Dr. Laural Golden, but I could get her in with Terri sooner and that I would send this to Tammy to see if there are any medication recommendations that may help until her appointment in July.  [phone]

## 2016-10-26 NOTE — Telephone Encounter (Signed)
Patient states that on yesterday she took two Trulance , used enema and two dulcolax with no results. At midnight she took 2 more Trulance and drank a hot cup of coffee,3 hours later she had a a bowel movement. She had back surgery on Thursday and is trying not to take pain medication as she knows that this will increase her symptoms.  Patient was advised that this would be discussed with Dr.Rehman and then we would call her back with his recommendation.

## 2016-10-27 ENCOUNTER — Telehealth (INDEPENDENT_AMBULATORY_CARE_PROVIDER_SITE_OTHER): Payer: Self-pay | Admitting: Internal Medicine

## 2016-10-27 NOTE — Telephone Encounter (Signed)
Patient presented to the office to give [place] her Medicaid card and a letter from [facility] showing her eligibility from now until April 2019.  Patient stated that she had back surgery on 10/20/16.  She stated that the surgeon gave her Oxycodone for pain and she can't take it, it's messing her stomach up.  She has called the surgeon and left them a message.  She knows in the past Dr. Laural Golden has told her not to take certain medication because it upsets her stomach.  She is requesting a letter stating medications she can and cannot take so she can show her surgeon when she goes back next Wednesday for a follow up appointment.  [phone]

## 2016-10-27 NOTE — Telephone Encounter (Signed)
Noted. Will discuss with Dr.Rehman about the letter that the patient is requesting.

## 2016-10-28 NOTE — Telephone Encounter (Signed)
Dr.Rehman states that the patient may take those medications. No letter will be given at this time.

## 2016-10-28 NOTE — Telephone Encounter (Signed)
Noted  

## 2016-10-29 ENCOUNTER — Encounter (HOSPITAL_COMMUNITY): Payer: Self-pay | Admitting: Emergency Medicine

## 2016-10-29 ENCOUNTER — Emergency Department (HOSPITAL_COMMUNITY): Payer: Medicaid Other

## 2016-10-29 ENCOUNTER — Emergency Department (HOSPITAL_COMMUNITY)
Admission: EM | Admit: 2016-10-29 | Discharge: 2016-10-29 | Disposition: A | Discharge status: Home / Self Care | Payer: Medicaid Other | Attending: Emergency Medicine | Admitting: Emergency Medicine

## 2016-10-29 DIAGNOSIS — I10 Essential (primary) hypertension: Secondary | ICD-10-CM | POA: Diagnosis not present

## 2016-10-29 DIAGNOSIS — K5903 Drug induced constipation: Secondary | ICD-10-CM

## 2016-10-29 DIAGNOSIS — F909 Attention-deficit hyperactivity disorder, unspecified type: Secondary | ICD-10-CM | POA: Diagnosis not present

## 2016-10-29 DIAGNOSIS — Z79899 Other long term (current) drug therapy: Secondary | ICD-10-CM | POA: Insufficient documentation

## 2016-10-29 DIAGNOSIS — K5900 Constipation, unspecified: Secondary | ICD-10-CM | POA: Diagnosis present

## 2016-10-29 DIAGNOSIS — Z87891 Personal history of nicotine dependence: Secondary | ICD-10-CM | POA: Insufficient documentation

## 2016-10-29 DIAGNOSIS — J45909 Unspecified asthma, uncomplicated: Secondary | ICD-10-CM | POA: Diagnosis not present

## 2016-10-29 MED ORDER — MAGNESIUM CITRATE PO SOLN
0.5000 | Freq: Once | ORAL | Status: AC
  Administered 2016-10-29: 0.5 via ORAL
  Filled 2016-10-29: qty 296

## 2016-10-29 MED ORDER — GLYCERIN (ADULT) 2 G RE SUPP: 1.0000 | RECTAL | 0 refills | Status: DC | PRN

## 2016-10-29 NOTE — ED Provider Notes (Signed)
[facility] DEPT Provider Note   CSN: [id number] Arrival date & time: 10/29/16  2130     History   Chief Complaint Chief Complaint  Patient presents with  . Constipation    HPI Sharon Vazquez is a 36 y.o. female.  Patient with history of multiple spine surgeries presents with complaint of constipation. She is currently on oral Dilaudid post surgery performed in the past 2 weeks. Patient states that her stomach feels firm and bloated. She is unsure of her last BM. She has had vomiting several days ago but not persistently. No fevers. Patient has generalized abdominal cramping, mainly on the left side. She has used MiraLAX, stool softeners, Dulcolax, enema at home without relief. The onset of this condition was gradual. The course is constant. Aggravating factors: none. Alleviating factors: none.        Past Medical History:  Diagnosis Date  . Abnormal Pap smear   . ADHD (attention deficit hyperactivity disorder)   . Anxiety   . Anxiety    takes Xanax daily as needed  . Arthritis    degenerative spine  . Asthma    Ventolin as needed and QVAR takes daily  . Back spasm    takes Flexeril daily as needed  . Bipolar 1 disorder ([place])   . Bipolar 1 disorder ([facility])    takes DOxepin daily  . Borderline personality disorder   . Chronic back pain    HNP  . Constipation    takes Dulcolax daily as needed takes Amitiza daily  . Contraceptive management 08/23/2013  . Cough    BROWN- GREEN THICK MUCUS  . Depression   . Eczema    has 2 creams uses as needed  . GERD (gastroesophageal reflux disease)    takes Tums as needed  . Headache(784.0)   . Headache(784.0)    migraines-last one about 28months ago;takes Topamax daily  . History of bronchitis    last time 4-31yrs ago  . History of colon polyps   . HSV-2 (herpes simplex virus 2) infection   . Hx of chlamydia infection   . Hypertension   . Hypertension    takes INderal and Clonidine daily  . IBS (irritable bowel  syndrome)   . Insomnia    takes Ambien nightly  . Internal hemorrhoids   . Irregular periods 04/02/2014  . Joint swelling    right knee  . Mental disorder    takes Abilify as needed  . Obesity   . Panic attack   . Panic disorder   . Sciatica   . Shortness of breath    with exertion  . Urinary urgency   . Weakness    numbness and tingling to left foot    Patient Active Problem List   Diagnosis Date Noted  . Toxic effect of venom 10/10/2016  . Radiculopathy 04/06/2016  . Bipolar 2 disorder, major depressive episode ([place]) 12/05/2015  . Intractable pain 12/17/2014  . Mass of breast, right 08/26/2014  . Chronic pain 06/13/2014  . Lumbar radiculopathy 04/23/2014  . Irregular periods 04/02/2014  . Displacement of lumbar intervertebral disc without myelopathy 11/01/2013  . Contraceptive management 08/23/2013  . Superficial fungus infection of skin 08/23/2013  . HNP (herniated nucleus pulposus), lumbar 07/03/2013  . Right ovarian cyst 01/22/2013  . Chronic constipation 12/17/2012  . 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  . Unspecified essential hypertension 08/07/2012  . Unspecified constipation 08/07/2012  . Bipolar disorder, unspecified ([place]) 08/07/2012  . Attention deficit hyperactivity disorder  08/07/2012  . Panic disorder 08/07/2012  . Rectal bleeding 08/07/2012  . Abdominal pain, right upper quadrant 08/07/2012    Past Surgical History:  Procedure Laterality Date  . BACK SURGERY    . bunion removal    . BUNIONECTOMY Left    pins in big toe and 2nd toe  . CHOLECYSTECTOMY  10 yrs ago  . CHOLECYSTECTOMY  10+yrs ago  . COLONOSCOPY N/A 08/22/2012   Procedure: COLONOSCOPY;  Surgeon: Rogene Houston, MD;  Location: [facility];  Service: Endoscopy;  Laterality: N/A;  100  . COLONOSCOPY    . COLONOSCOPY WITH PROPOFOL N/A 09/25/2015   Procedure: COLONOSCOPY WITH PROPOFOL;  Surgeon: Rogene Houston, MD;  Location: [facility];  Service: Endoscopy;   Laterality: N/A;  2:30-moved to 7:30 Ann notified pt  . epidural injections     x 2  . LUMBAR FUSION  04/06/2016  . LUMBAR LAMINECTOMY/DECOMPRESSION MICRODISCECTOMY Left 07/03/2013   Procedure: LEFT LUMBAR THREE-FOUR microdiskectomy;  Surgeon: Winfield Cunas, MD;  Location: [facility];  Service: Neurosurgery;  Laterality: Left;  LEFT LUMBAR THREE-FOUR microdiskectomy  . LUMBAR LAMINECTOMY/DECOMPRESSION MICRODISCECTOMY Left 11/29/2013   Procedure: LEFT Lumbar Four-Five Redo microdiskectomy;  Surgeon: Winfield Cunas, MD;  Location: [facility];  Service: Neurosurgery;  Laterality: Left;  LEFT Lumbar Four-Five Redo microdiskectomy  . POLYPECTOMY  09/25/2015   Procedure: POLYPECTOMY;  Surgeon: Rogene Houston, MD;  Location: [facility];  Service: Endoscopy;;  at cecum  . SPINAL CORD STIMULATOR INSERTION N/A 06/13/2014   Procedure: LUMBAR SPINAL CORD STIMULATOR INSERTION;  Surgeon: Ashok Pall, MD;  Location: [facility];  Service: Neurosurgery;  Laterality: N/A;  permanent spinal cord stimulator insertion  . SPINAL CORD STIMULATOR REMOVAL N/A 12/17/2014   Procedure: THORACIC SPINAL CORD STIMULATOR REMOVAL;  Surgeon: Ashok Pall, MD;  Location: [facility];  Service: Neurosurgery;  Laterality: N/A;  THORACIC SPINAL CORD STIMULATOR REMOVAL  . SPINAL CORD STIMULATOR TRIAL N/A 04/23/2014   Procedure: LUMBAR SPINAL CORD STIMULATOR TRIAL;  Surgeon: Ashok Pall, MD;  Location: [facility];  Service: Neurosurgery;  Laterality: N/A;  Spinal Cord Stimulator Trial  . TONSILLECTOMY    . TONSILLECTOMY     as a child  . wisdom teeth extracted    . WISDOM TOOTH EXTRACTION      OB History    Gravida Para Term Preterm AB Living   0             SAB TAB Ectopic Multiple Live Births                   Home Medications    Prior to Admission medications   Medication Sig Start Date End Date Taking? Authorizing Provider  albuterol (PROVENTIL HFA;VENTOLIN HFA) 108 (90 Base) MCG/ACT inhaler Inhale 1-2  puffs into the lungs every 6 (six) hours as needed for wheezing. 08/26/16   Kennith Gain, MD  ALPRAZolam (XANAX XR) 3 MG 24 hr tablet Take 3 mg by mouth 2 (two) times daily.    [provider]  AZO-CRANBERRY PO Take 2 tablets by mouth daily as needed (UTI symptoms).    [provider]  budesonide-formoterol (SYMBICORT) 160-4.5 MCG/ACT inhaler Inhale 2 puffs into the lungs 2 (two) times daily. 10/07/16   Kennith Gain, MD  cloNIDine (CATAPRES) 0.1 MG tablet Take 0.1 mg by mouth 2 (two) times daily.    [provider]  Crisaborole (EUCRISA) 2 % OINT Apply 1 application topically 2 (  two) times daily. 08/26/16   Kennith Gain, MD  doxepin (SINEQUAN) 50 MG capsule Take 1 capsule (50 mg total) by mouth at bedtime. Patient taking differently: Take 50 mg by mouth 3 (three) times daily.  12/07/15   Derrill Center, NP  DULoxetine (CYMBALTA) 60 MG capsule Take 60 mg by mouth 2 (two) times daily.    [provider]  fexofenadine (ALLEGRA) 180 MG tablet Take 1 tablet (180 mg total) by mouth daily. 10/07/16   Kennith Gain, MD  fluticasone (FLONASE) 50 MCG/ACT nasal spray Place 2 sprays into both nostrils daily. Patient taking differently: Place 2 sprays into both nostrils daily as needed for allergies or rhinitis.  08/26/16   Kennith Gain, MD  fluticasone (FLOVENT HFA) 110 MCG/ACT inhaler Inhale 2 puffs into the lungs daily.    [provider]  gemfibrozil (LOPID) 600 MG tablet Take 0.5 tablets (300 mg total) by mouth 2 (two) times daily before a meal. Patient taking differently: Take 600 mg by mouth 2 (two) times daily before a meal.  12/07/15   Derrill Center, NP  hydrocortisone (ANUSOL-HC) 25 MG suppository Place 25 mg rectally 2 (two) times daily as needed.    [provider]  ketorolac (ACULAR) 0.5 % ophthalmic solution Place 1 drop into both eyes 4 (four) times daily.    [provider]    lamoTRIgine (LAMICTAL) 25 MG tablet Take 1 tablet (25 mg total) by mouth daily. Patient taking differently: Take 100 mg by mouth daily.  12/07/15   Derrill Center, NP  medroxyPROGESTERone (DEPO-PROVERA) 150 MG/ML injection INJECT 1 VIAL INTRAMUSCULARLY EVERY 3 MONTHS IN OFFICE. 10/07/16   Derrek Monaco A, NP  megestrol (MEGACE) 40 MG tablet TAKE 3 TABLETS DAILY FOR 5 DAYS, 2 TABLETS DAILY FOR 5 DAYS, THEN1 TABLET DAILY UNTIL BLEEDING STOPS. 10/07/16   Roma Schanz, CNM  mometasone (ELOCON) 0.1 % ointment Apply 1 application topically 2 (two) times daily as needed (eczema).    [provider]  mometasone (NASONEX) 50 MCG/ACT nasal spray Place 2 sprays into the nose daily as needed (congestion).     [provider]  montelukast (SINGULAIR) 10 MG tablet Take 1 tablet (10 mg total) by mouth at bedtime. 10/04/16   Kennith Gain, MD  NEXIUM 40 MG capsule TAKE 1 CAPSULE ONCE DAILY AT 12 NOON. Patient taking differently: TAKE 1 CAPSULE ONCE DAILY 09/16/16   Setzer, Rona Ravens, NP  nystatin (NYSTATIN) powder Apply topically 4 (four) times daily.    [provider]  Olopatadine HCl (PATADAY) 0.2 % SOLN Place 1 drop into both eyes daily as needed (itchy eyes). 08/26/16   Kennith Gain, MD  oxyCODONE-acetaminophen (PERCOCET/ROXICET) 5-325 MG tablet Take 1-2 tablets by mouth every 4 (four) hours as needed. 09/06/16   Milton Ferguson, MD  Plecanatide (TRULANCE) 3 MG TABS Take 3 mg by mouth every morning. 06/14/16   Rogene Houston, MD  prednisoLONE acetate (PRED FORTE) 1 % ophthalmic suspension Place 1 drop into the right eye 4 (four) times daily.    [provider]  promethazine (PHENERGAN) 25 MG tablet Take 25 mg by mouth every 6 (six) hours as needed for nausea or vomiting.    [provider]  propranolol (INDERAL) 10 MG tablet Take 10 mg by mouth 3 (three) times daily as needed (takes it twice daily and a 3rd dose if needed).     [provider]  triazolam (HALCION) 0.25 MG tablet  Take 0.25 mg by mouth at bedtime.     [provider]  Urea (ALUVEA) 39 % CREA Apply 1 application topically daily as needed (to heels).     [provider]    Family History Family History  Problem Relation Age of Onset  . Diabetes Mother   . Thyroid disease Mother   . Other Mother        PTSD  . Hyperlipidemia Mother   . Hypertension Maternal Aunt   . Diabetes Maternal Aunt   . Thyroid disease Maternal Aunt   . Diabetes Maternal Grandmother   . Heart disease Maternal Grandmother        CHF  . Hypertension Maternal Grandmother   . Dementia Maternal Grandmother   . Diabetes Father   . Hypertension Father   . Obesity Father   . Cancer Paternal Grandmother        breast  . Alcohol abuse Paternal Grandmother   . Crohn's disease Maternal Uncle   . Hyperlipidemia Maternal Uncle   . Other Maternal Uncle        back pain  . Dementia Maternal Uncle   . Diabetes Cousin   . Other Maternal Grandfather        lung transplant    Social History Social History  Substance Use Topics  . Smoking status: Former Smoker    Packs/day: 0.00    Years: 9.00    Types: Cigarettes  . Smokeless tobacco: Never Used     Comment: stopped 04/05/16  . Alcohol use 0.0 oz/week     Comment: OCC WINE     Allergies   Bee venom; Dilaudid [hydromorphone hcl]; Other; Peanut oil; Senna; Adhesive [tape]; and Latex   Review of Systems Review of Systems  Constitutional: Negative for fever.  HENT: Negative for rhinorrhea and sore throat.   Eyes: Negative for redness.  Respiratory: Negative for cough.   Cardiovascular: Negative for chest pain.  Gastrointestinal: Positive for abdominal distention, abdominal pain, nausea and vomiting (not persistent). Negative for blood in stool and diarrhea.  Genitourinary: Negative for dysuria.  Musculoskeletal: Negative for myalgias.  Skin: Negative for rash.  Neurological: Negative for  headaches.     Physical Exam Updated Vital Signs BP 125/86 (BP Location: Left Arm)   Pulse (!) 108   Temp 97.7 F (36.5 C) (Oral)   Resp 20   Ht 5\' 6"  (1.676 m)   Wt 96.2 kg (212 lb)   SpO2 99%   BMI 34.22 kg/m   Physical Exam  Constitutional: She appears well-developed and well-nourished.  HENT:  Head: Normocephalic and atraumatic.  Eyes: Conjunctivae are normal. Right eye exhibits no discharge. Left eye exhibits no discharge.  Neck: Normal range of motion. Neck supple.  Cardiovascular: Normal rate, regular rhythm and normal heart sounds.   Pulmonary/Chest: Effort normal and breath sounds normal.  Abdominal: Soft. Bowel sounds are normal. There is no tenderness. There is no rebound and no guarding.  Neurological: She is alert.  Skin: Skin is warm and dry.  Psychiatric: She exhibits a depressed mood.  Tearful.   Nursing note and vitals reviewed.    ED Treatments / Results   Radiology Dg Abdomen 1 View  Result Date: 10/29/2016 CLINICAL DATA:  Abdominal pain and swelling. EXAM: ABDOMEN - 1 VIEW COMPARISON:  None. FINDINGS: Surgical hardware seen in the lumbar spine. Cholecystectomy clips identified. The lung bases are normal. No free air, portal venous gas, or pneumatosis. Moderate fecal loading throughout the descending colon. No other  acute abnormalities. No bowel obstruction. IMPRESSION: Moderate fecal loading in the descending colon. Electronically Signed   By: Dorise Bullion III M.D   On: 10/29/2016 09:05    Procedures Procedures (including critical care time)  Medications Ordered in ED Medications - No data to display   Initial Impression / Assessment and Plan / ED Course  I have reviewed the triage vital signs and the nursing notes.  Pertinent labs & imaging results that were available during my care of the patient were reviewed by me and considered in my medical decision making (see chart for details).     Patient seen and examined. Work-up initiated.  Medications ordered.   Vital signs reviewed and are as follows: BP 125/86 (BP Location: Left Arm)   Pulse (!) 108   Temp 97.7 F (36.5 C) (Oral)   Resp 20   Ht 5\' 6"  (1.676 m)   Wt 96.2 kg (212 lb)   SpO2 99%   BMI 34.22 kg/m   9:20 AM x-ray reviewed. Patient updated. Will give half a bottle of mag citrate here and half for home. Patient encouraged to continue twice a day MiraLAX and will give a prescription for glycerin suppositories. No evidence of fecal impaction on x-ray.  We discussed that once she is having normal bowel movements she must do something to maintain while on narcotic pain medications. This may be fiber, MiraLAX, Colace.  Patient encouraged to follow-up with her primary care physician next week and return to the emergency department with vomiting, fever, worsening pain, blood in her stools or new concerns. She verbalizes understanding and agrees with plan.  Final Clinical Impressions(s) / ED Diagnoses   Final diagnoses:  Drug-induced constipation   Patient with constipation in setting of chronic opioid use after recent surgery. No signs of obstruction. X-ray as above with moderate stool in the descending colon which correlates with area of patient's pain. No fecal impaction. Will continue laxative treatments as above.  New Prescriptions New Prescriptions   GLYCERIN ADULT 2 G SUPPOSITORY    Place 1 suppository rectally as needed for constipation.     Carlisle Cater, PA-C 10/29/16 [place], MD 10/29/16 [phone]

## 2016-10-29 NOTE — ED Triage Notes (Signed)
PT c/o constipation and states she can't remember when her last BM was. PT states she had a spinal fusion on 10/20/16 and is on Dilaudid po. PT states no relief from Ducolax and Miralax at home.

## 2016-10-29 NOTE — Discharge Instructions (Signed)
Please read and follow all provided instructions.  Your diagnoses today include:  1. Drug-induced constipation     Tests performed today include:  X-ray of your abdomen that shows a large amount of stool in your abdomen on the left side  Vital signs. See below for your results today.   Medications prescribed:   Magnesium citrate - laxative   Glycerin suppository - laxative  Take any medications only as directed on prescription or on packaging.   Home care instructions:  Follow any educational materials contained in this packet.  Continue maintaining good fiber intake and using MiraLAX twice a day until stools are clear.  Follow-up instructions: Please follow-up with your primary care provider in the next week for further evaluation of your symptoms.   Return instructions:   Please return to the Emergency Department if you experience worsening symptoms.   Please return if you have worsening abdominal pain, swelling of your abdomen, persistent vomiting, blood in your stool or vomit, or fever.   Please return if you have any other emergent concerns.  Additional Information:  Your vital signs today were: BP 125/86 (BP Location: Left Arm)    Pulse (!) 108    Temp 97.7 F (36.5 C) (Oral)    Resp 20    Ht 5\' 6"  (1.676 m)    Wt 96.2 kg (212 lb)    SpO2 99%    BMI 34.22 kg/m  If your blood pressure (BP) was elevated above 135/85 this visit, please have this repeated by your doctor within one month. --------------

## 2016-11-01 ENCOUNTER — Other Ambulatory Visit (HOSPITAL_BASED_OUTPATIENT_CLINIC_OR_DEPARTMENT_OTHER): Payer: Self-pay

## 2016-11-01 DIAGNOSIS — R5383 Other fatigue: Secondary | ICD-10-CM

## 2016-11-01 DIAGNOSIS — R0683 Snoring: Secondary | ICD-10-CM

## 2016-11-01 NOTE — Discharge Summary (Signed)
Patient ID: Sharon Vazquez MRN: [id number] DOB/AGE: 36-13-1982 36 y.o.  Admit date: 10/20/2016 Discharge date: 10/21/2016  Admission Diagnoses:  Active Problems:   Radiculopathy   Discharge Diagnoses:  Same  Past Medical History:  Diagnosis Date  . Abnormal Pap smear   . ADHD (attention deficit hyperactivity disorder)   . Anxiety   . Anxiety    takes Xanax daily as needed  . Arthritis    degenerative spine  . Asthma    Ventolin as needed and QVAR takes daily  . Back spasm    takes Flexeril daily as needed  . Bipolar 1 disorder ([place])   . Bipolar 1 disorder ([facility])    takes DOxepin daily  . Borderline personality disorder   . Chronic back pain    HNP  . Constipation    takes Dulcolax daily as needed takes Amitiza daily  . Contraceptive management 08/23/2013  . Cough    BROWN- GREEN THICK MUCUS  . Depression   . Eczema    has 2 creams uses as needed  . GERD (gastroesophageal reflux disease)    takes Tums as needed  . Headache(784.0)   . Headache(784.0)    migraines-last one about 81months ago;takes Topamax daily  . History of bronchitis    last time 4-4yrs ago  . History of colon polyps   . HSV-2 (herpes simplex virus 2) infection   . Hx of chlamydia infection   . Hypertension   . Hypertension    takes INderal and Clonidine daily  . IBS (irritable bowel syndrome)   . Insomnia    takes Ambien nightly  . Internal hemorrhoids   . Irregular periods 04/02/2014  . Joint swelling    right knee  . Mental disorder    takes Abilify as needed  . Obesity   . Panic attack   . Panic disorder   . Sciatica   . Shortness of breath    with exertion  . Urinary urgency   . Weakness    numbness and tingling to left foot    Surgeries: Procedure(s): RIGHT SIDED LUMBAR FOUR-FIVE TRANSFORAMINAL LUMBAR FUSION INTERBODY FUSION WITH INSTRUMENTATION AND ALLOGRAFT on 10/20/2016   Consultants:  None  Discharged Condition: Improved  Hospital Course: Sharon Vazquez is an 36 y.o. female who was admitted 10/20/2016 for operative treatment of radiculopathy. Patient has severe unremitting pain that affects sleep, daily activities, and work/hobbies. After pre-op clearance the patient was taken to the operating room on 10/20/2016 and underwent  Procedure(s): RIGHT SIDED LUMBAR FOUR-FIVE TRANSFORAMINAL LUMBAR FUSION INTERBODY FUSION WITH INSTRUMENTATION AND ALLOGRAFT.    Patient was given perioperative antibiotics:  Anti-infectives    Start     Dose/Rate Route Frequency Ordered Stop   10/20/16 1900  ceFAZolin (ANCEF) IVPB 2g/100 mL premix     2 g 200 mL/hr over 30 Minutes Intravenous Every 8 hours 10/20/16 1655 10/21/16 0315   10/20/16 0735  ceFAZolin (ANCEF) IVPB 2g/100 mL premix     2 g 200 mL/hr over 30 Minutes Intravenous On call to O.R. 10/20/16 0735 10/20/16 1105       Patient was given sequential compression devices, early ambulation to prevent DVT.  Patient benefited maximally from hospital stay and there were no complications.    Recent vital signs: BP 121/79 (BP Location: Left Arm)   Pulse (!) 128   Temp 98.4 F (36.9 C) (Oral)   Resp 20   Ht 5\' 6"  (1.676 m)   Wt 96.2 kg (  212 lb)   SpO2 98%   BMI 34.22 kg/m    Discharge Medications:   Allergies as of 10/21/2016      Reactions   Bee Venom Anaphylaxis   Dilaudid [hydromorphone Hcl] Anaphylaxis, Hives   Has tolerated morphine since this reaction   Other Anaphylaxis   Bandaid, Nuts, Peanut Oil    Peanut Oil Anaphylaxis   Senna Anaphylaxis, Hives   Adhesive [tape] Hives, Other (See Comments)   Pulls skin off (use paper tape)   Latex Hives      Medication List    TAKE these medications   albuterol 108 (90 Base) MCG/ACT inhaler Commonly known as:  PROVENTIL HFA;VENTOLIN HFA Inhale 1-2 puffs into the lungs every 6 (six) hours as needed for wheezing.   ALPRAZolam 3 MG 24 hr tablet Commonly known as:  XANAX XR Take 3 mg by mouth 2 (two) times daily.   ALUVEA 39 %  Crea Generic drug:  Urea Apply 1 application topically daily as needed (to heels).   ANUSOL-HC 25 MG suppository Generic drug:  hydrocortisone Place 25 mg rectally 2 (two) times daily as needed.   AZO-CRANBERRY PO Take 2 tablets by mouth daily as needed (UTI symptoms).   budesonide-formoterol 160-4.5 MCG/ACT inhaler Commonly known as:  SYMBICORT Inhale 2 puffs into the lungs 2 (two) times daily.   cloNIDine 0.1 MG tablet Commonly known as:  CATAPRES Take 0.1 mg by mouth 2 (two) times daily.   Crisaborole 2 % Oint Commonly known as:  EUCRISA Apply 1 application topically 2 (two) times daily.   doxepin 50 MG capsule Commonly known as:  SINEQUAN Take 1 capsule (50 mg total) by mouth at bedtime. What changed:  when to take this   DULoxetine 60 MG capsule Commonly known as:  CYMBALTA Take 60 mg by mouth 2 (two) times daily.   fexofenadine 180 MG tablet Commonly known as:  ALLEGRA Take 1 tablet (180 mg total) by mouth daily.   fluticasone 110 MCG/ACT inhaler Commonly known as:  FLOVENT HFA Inhale 2 puffs into the lungs daily.   fluticasone 50 MCG/ACT nasal spray Commonly known as:  FLONASE Place 2 sprays into both nostrils daily. What changed:  when to take this  reasons to take this   gemfibrozil 600 MG tablet Commonly known as:  LOPID Take 0.5 tablets (300 mg total) by mouth 2 (two) times daily before a meal. What changed:  how much to take   ketorolac 0.5 % ophthalmic solution Commonly known as:  ACULAR Place 1 drop into both eyes 4 (four) times daily.   lamoTRIgine 25 MG tablet Commonly known as:  LAMICTAL Take 1 tablet (25 mg total) by mouth daily. What changed:  how much to take   medroxyPROGESTERone 150 MG/ML injection Commonly known as:  DEPO-PROVERA INJECT 1 VIAL INTRAMUSCULARLY EVERY 3 MONTHS IN OFFICE.   megestrol 40 MG tablet Commonly known as:  MEGACE TAKE 3 TABLETS DAILY FOR 5 DAYS, 2 TABLETS DAILY FOR 5 DAYS, THEN1 TABLET DAILY UNTIL  BLEEDING STOPS.   mometasone 0.1 % ointment Commonly known as:  ELOCON Apply 1 application topically 2 (two) times daily as needed (eczema).   mometasone 50 MCG/ACT nasal spray Commonly known as:  NASONEX Place 2 sprays into the nose daily as needed (congestion).   montelukast 10 MG tablet Commonly known as:  SINGULAIR Take 1 tablet (10 mg total) by mouth at bedtime.   NEXIUM 40 MG capsule Generic drug:  esomeprazole TAKE 1 CAPSULE ONCE DAILY AT  12 NOON. What changed:  See the new instructions.   nystatin powder Generic drug:  nystatin Apply topically 4 (four) times daily.   Olopatadine HCl 0.2 % Soln Commonly known as:  PATADAY Place 1 drop into both eyes daily as needed (itchy eyes).   oxyCODONE-acetaminophen 5-325 MG tablet Commonly known as:  PERCOCET/ROXICET Take 1-2 tablets by mouth every 4 (four) hours as needed.   Plecanatide 3 MG Tabs Commonly known as:  TRULANCE Take 3 mg by mouth every morning.   prednisoLONE acetate 1 % ophthalmic suspension Commonly known as:  PRED FORTE Place 1 drop into the right eye 4 (four) times daily.   promethazine 25 MG tablet Commonly known as:  PHENERGAN Take 25 mg by mouth every 6 (six) hours as needed for nausea or vomiting.   propranolol 10 MG tablet Commonly known as:  INDERAL Take 10 mg by mouth 3 (three) times daily as needed (takes it twice daily and a 3rd dose if needed).   triazolam 0.25 MG tablet Commonly known as:  HALCION Take 0.25 mg by mouth at bedtime.       Diagnostic Studies: Dg Chest 2 View  Result Date: 10/12/2016 CLINICAL DATA:  Preoperative evaluation for upcoming lumbar fusion EXAM: CHEST  2 VIEW COMPARISON:  03/30/2016 FINDINGS: The heart size and mediastinal contours are within normal limits. Both lungs are clear. The visualized skeletal structures are unremarkable. IMPRESSION: No active cardiopulmonary disease. Electronically Signed   By: Inez Catalina M.D.   On: 10/12/2016 13:57   Dg Lumbar Spine  2-3 Views  Result Date: 10/20/2016 CLINICAL DATA:  Posterior screw and plate fixation EXAM: LUMBAR SPINE - 2-3 VIEW; DG C-ARM 61-120 MIN COMPARISON:  Study obtained earlier in the day FLUOROSCOPY TIME:  0 minutes 39 seconds; 2 acquired images FINDINGS: Frontal and lateral views were obtained. There is posterior screw and plate fixation at L3, L4, and L5. Pedicle screw tips are in the respective vertebral bodies. There are disc spacers at L3-4 and L4-5. No fracture or spondylolisthesis. Visualized disc spaces appear normal. IMPRESSION: Postoperative change from L3 through L5 with support hardware intact. No appreciable disc space narrowing. No fracture or spondylolisthesis. Electronically Signed   By: Lowella Grip III M.D.   On: 10/20/2016 15:01   Dg Abdomen 1 View  Result Date: 10/29/2016 CLINICAL DATA:  Abdominal pain and swelling. EXAM: ABDOMEN - 1 VIEW COMPARISON:  None. FINDINGS: Surgical hardware seen in the lumbar spine. Cholecystectomy clips identified. The lung bases are normal. No free air, portal venous gas, or pneumatosis. Moderate fecal loading throughout the descending colon. No other acute abnormalities. No bowel obstruction. IMPRESSION: Moderate fecal loading in the descending colon. Electronically Signed   By: Dorise Bullion III M.D   On: 10/29/2016 09:05   Dg Lumbar Spine 1 View  Result Date: 10/20/2016 CLINICAL DATA:  Localization images for RIGHT SIDED LUMBAR FOUR-FIVE TRANSFORAMINAL LUMBAR FUSION INTERBODY FUSION WITH INSTRUMENTATION AND ALLOGRAFT for Right leg pain. EXAM: LUMBAR SPINE - 1 VIEW COMPARISON:  MR 09/06/2016 and previous FINDINGS: Single lateral intraoperative radiograph shows needles from posterior approach, 1 projecting posterior to the L4 spinous process, the second inferior to this first sacral segment. Fixation hardware noted L3-4 as before. IMPRESSION: Intraoperative localization. Electronically Signed   By: Lucrezia Europe M.D.   On: 10/20/2016 14:51   Dg C-arm  1-60 Min  Result Date: 10/20/2016 CLINICAL DATA:  Posterior screw and plate fixation EXAM: LUMBAR SPINE - 2-3 VIEW; DG C-ARM 61-120 MIN COMPARISON:  Study obtained earlier in the day FLUOROSCOPY TIME:  0 minutes 39 seconds; 2 acquired images FINDINGS: Frontal and lateral views were obtained. There is posterior screw and plate fixation at L3, L4, and L5. Pedicle screw tips are in the respective vertebral bodies. There are disc spacers at L3-4 and L4-5. No fracture or spondylolisthesis. Visualized disc spaces appear normal. IMPRESSION: Postoperative change from L3 through L5 with support hardware intact. No appreciable disc space narrowing. No fracture or spondylolisthesis. Electronically Signed   By: Lowella Grip III M.D.   On: 10/20/2016 15:01    Disposition: 01-Home or Self Care   POD #1 s/p L4/5 decompression and fusion, doing well  - up with PT/OT, encourage ambulation - Percocet for pain, Valium for muscle spasms -Written scripts for pain signed and in chart -D/C instructions sheet printed and in chart -D/C today  -F/U in office 2 weeks   Signed: Justice Britain 11/01/2016, 12:55 PM

## 2016-11-02 ENCOUNTER — Ambulatory Visit (INDEPENDENT_AMBULATORY_CARE_PROVIDER_SITE_OTHER): Payer: Medicaid Other | Admitting: *Deleted

## 2016-11-02 DIAGNOSIS — T63441D Toxic effect of venom of bees, accidental (unintentional), subsequent encounter: Secondary | ICD-10-CM

## 2016-11-05 NOTE — Addendum Note (Signed)
Addendum  created 11/05/16 1017 by Duane Boston, MD   Sign clinical note

## 2016-11-06 ENCOUNTER — Ambulatory Visit: Payer: Medicaid Other | Attending: Internal Medicine | Admitting: Neurology

## 2016-11-06 DIAGNOSIS — R0683 Snoring: Secondary | ICD-10-CM | POA: Insufficient documentation

## 2016-11-06 DIAGNOSIS — R5383 Other fatigue: Secondary | ICD-10-CM | POA: Insufficient documentation

## 2016-11-09 ENCOUNTER — Ambulatory Visit (INDEPENDENT_AMBULATORY_CARE_PROVIDER_SITE_OTHER): Payer: Medicaid Other | Admitting: *Deleted

## 2016-11-09 DIAGNOSIS — T63441D Toxic effect of venom of bees, accidental (unintentional), subsequent encounter: Secondary | ICD-10-CM | POA: Diagnosis not present

## 2016-11-13 NOTE — Procedures (Signed)
[place] A. Merlene Laughter, MD     [email or web address]             NOCTURNAL POLYSOMNOGRAPHY   LOCATION: [facility]  Patient Name: Sharon Vazquez, Sharon Vazquez Date: 11/06/2016 Gender: Female D.O.B: February 23, 1981 Age (years): 24 Referring Provider: Abran Richard MD Height (inches): 66 Interpreting Physician: Phillips Odor MD, ABSM Weight (lbs): 225 RPSGT: Rosebud Poles BMI: 36 MRN: [id number] Neck Size: 16.50 CLINICAL INFORMATION Sleep Study Type: NPSG  Indication for sleep study: Fatigue, Snoring  Epworth Sleepiness Score: 13  SLEEP STUDY TECHNIQUE As per the AASM Manual for the Scoring of Sleep and Associated Events v2.3 (April 2016) with a hypopnea requiring 4% desaturations.  The channels recorded and monitored were frontal, central and occipital EEG, electrooculogram (EOG), submentalis EMG (chin), nasal and oral airflow, thoracic and abdominal wall motion, anterior tibialis EMG, snore microphone, electrocardiogram, and pulse oximetry.  MEDICATIONS Medications self-administered by patient taken the night of the study : N/A  Current Outpatient Prescriptions:  .  albuterol (PROVENTIL HFA;VENTOLIN HFA) 108 (90 Base) MCG/ACT inhaler, Inhale 1-2 puffs into the lungs every 6 (six) hours as needed for wheezing., Disp: 1 Inhaler, Rfl: 1 .  ALPRAZolam (XANAX XR) 3 MG 24 hr tablet, Take 3 mg by mouth 2 (two) times daily., Disp: , Rfl:  .  AZO-CRANBERRY PO, Take 2 tablets by mouth daily as needed (UTI symptoms)., Disp: , Rfl:  .  budesonide-formoterol (SYMBICORT) 160-4.5 MCG/ACT inhaler, Inhale 2 puffs into the lungs 2 (two) times daily., Disp: 1 Inhaler, Rfl: 5 .  cloNIDine (CATAPRES) 0.1 MG tablet, Take 0.1 mg by mouth 2 (two) times daily., Disp: , Rfl:  .  Crisaborole (EUCRISA) 2 % OINT, Apply 1 application topically 2 (two) times daily., Disp: 60 g, Rfl: 5 .  doxepin (SINEQUAN) 50 MG capsule, Take 1 capsule (50 mg total) by mouth at bedtime. (Patient taking  differently: Take 50 mg by mouth 3 (three) times daily. ), Disp: 30 capsule, Rfl: 0 .  DULoxetine (CYMBALTA) 60 MG capsule, Take 60 mg by mouth 2 (two) times daily., Disp: , Rfl:  .  fexofenadine (ALLEGRA) 180 MG tablet, Take 1 tablet (180 mg total) by mouth daily., Disp: 30 tablet, Rfl: 5 .  fluticasone (FLONASE) 50 MCG/ACT nasal spray, Place 2 sprays into both nostrils daily. (Patient taking differently: Place 2 sprays into both nostrils daily as needed for allergies or rhinitis. ), Disp: 16 g, Rfl: 5 .  fluticasone (FLOVENT HFA) 110 MCG/ACT inhaler, Inhale 2 puffs into the lungs daily., Disp: , Rfl:  .  gemfibrozil (LOPID) 600 MG tablet, Take 0.5 tablets (300 mg total) by mouth 2 (two) times daily before a meal. (Patient taking differently: Take 600 mg by mouth 2 (two) times daily before a meal. ), Disp: 90 tablet, Rfl: 0 .  glycerin adult 2 g suppository, Place 1 suppository rectally as needed for constipation., Disp: 12 suppository, Rfl: 0 .  hydrocortisone (ANUSOL-HC) 25 MG suppository, Place 25 mg rectally 2 (two) times daily as needed., Disp: , Rfl:  .  ketorolac (ACULAR) 0.5 % ophthalmic solution, Place 1 drop into both eyes 4 (four) times daily., Disp: , Rfl:  .  lamoTRIgine (LAMICTAL) 25 MG tablet, Take 1 tablet (25 mg total) by mouth daily. (Patient taking differently: Take 100 mg by mouth daily. ), Disp: 30 tablet, Rfl: 0 .  medroxyPROGESTERone (DEPO-PROVERA) 150 MG/ML injection, INJECT 1 VIAL INTRAMUSCULARLY EVERY 3 MONTHS IN OFFICE., Disp: 1 mL, Rfl: 0 .  megestrol (MEGACE) 40 MG tablet,  TAKE 3 TABLETS DAILY FOR 5 DAYS, 2 TABLETS DAILY FOR 5 DAYS, THEN1 TABLET DAILY UNTIL BLEEDING STOPS., Disp: 45 tablet, Rfl: 0 .  mometasone (ELOCON) 0.1 % ointment, Apply 1 application topically 2 (two) times daily as needed (eczema)., Disp: , Rfl:  .  mometasone (NASONEX) 50 MCG/ACT nasal spray, Place 2 sprays into the nose daily as needed (congestion). , Disp: , Rfl:  .  montelukast (SINGULAIR) 10 MG  tablet, Take 1 tablet (10 mg total) by mouth at bedtime., Disp: 30 tablet, Rfl: 5 .  NEXIUM 40 MG capsule, TAKE 1 CAPSULE ONCE DAILY AT 12 NOON. (Patient taking differently: TAKE 1 CAPSULE ONCE DAILY), Disp: 30 capsule, Rfl: 5 .  nystatin (NYSTATIN) powder, Apply topically 4 (four) times daily., Disp: , Rfl:  .  Olopatadine HCl (PATADAY) 0.2 % SOLN, Place 1 drop into both eyes daily as needed (itchy eyes)., Disp: 2.5 mL, Rfl: 5 .  oxyCODONE-acetaminophen (PERCOCET/ROXICET) 5-325 MG tablet, Take 1-2 tablets by mouth every 4 (four) hours as needed., Disp: 30 tablet, Rfl: 0 .  Plecanatide (TRULANCE) 3 MG TABS, Take 3 mg by mouth every morning., Disp: 30 tablet, Rfl: 5 .  prednisoLONE acetate (PRED FORTE) 1 % ophthalmic suspension, Place 1 drop into the right eye 4 (four) times daily., Disp: , Rfl:  .  promethazine (PHENERGAN) 25 MG tablet, Take 25 mg by mouth every 6 (six) hours as needed for nausea or vomiting., Disp: , Rfl:  .  propranolol (INDERAL) 10 MG tablet, Take 10 mg by mouth 3 (three) times daily as needed (takes it twice daily and a 3rd dose if needed). , Disp: , Rfl:  .  triazolam (HALCION) 0.25 MG tablet, Take 0.25 mg by mouth at bedtime. , Disp: , Rfl:  .  Urea (ALUVEA) 39 % CREA, Apply 1 application topically daily as needed (to heels). , Disp: , Rfl:    SLEEP ARCHITECTURE The study was initiated at 10:37:02 PM and ended at 5:17:13 AM.  Sleep onset time was 0.8 minutes and the sleep efficiency was 84.9%. The total sleep time was 339.9 minutes.  Stage REM latency was 180.5 minutes.  The patient spent 3.24% of the night in stage N1 sleep, 86.76% in stage N2 sleep, 7.36% in stage N3 and 2.65% in REM.  Alpha intrusion was absent.  Supine sleep was 48.62%.  RESPIRATORY PARAMETERS The overall apnea/hypopnea index (AHI) was 3.4 per hour. There were 2 total apneas, including 1 obstructive, 0 central and 1 mixed apneas. There were 17 hypopneas and 1 RERAs.  The AHI during Stage REM  sleep was 13.3 per hour.  AHI while supine was 6.9 per hour.  The mean oxygen saturation was 96.37%. The minimum SpO2 during sleep was 71.00%.  Loud snoring was noted during this study.  CARDIAC DATA The 2 lead EKG demonstrated sinus rhythm. The mean heart rate was N/A beats per minute. Other EKG findings include: None. LEG MOVEMENT DATA The total PLMS were 0 with a resulting PLMS index of 0.00. Associated arousal with leg movement index was 0.0.  IMPRESSIONS - Reduced REM sleep and slow wave sleep are observed. Otherwise the recording is unremarkable.    Delano Metz, MD Diplomate, American Board of Sleep Medicine.   ELECTRONICALLY SIGNED ON:  11/13/2016, 9:10 AM [place] PH: [phone]   FX: [phone] [place]

## 2016-11-16 ENCOUNTER — Ambulatory Visit (INDEPENDENT_AMBULATORY_CARE_PROVIDER_SITE_OTHER): Payer: Medicaid Other | Admitting: *Deleted

## 2016-11-16 DIAGNOSIS — T63441D Toxic effect of venom of bees, accidental (unintentional), subsequent encounter: Secondary | ICD-10-CM | POA: Diagnosis not present

## 2016-12-05 ENCOUNTER — Ambulatory Visit (INDEPENDENT_AMBULATORY_CARE_PROVIDER_SITE_OTHER): Payer: Medicaid Other

## 2016-12-05 DIAGNOSIS — T63441D Toxic effect of venom of bees, accidental (unintentional), subsequent encounter: Secondary | ICD-10-CM

## 2016-12-12 ENCOUNTER — Ambulatory Visit (INDEPENDENT_AMBULATORY_CARE_PROVIDER_SITE_OTHER): Payer: Medicaid Other

## 2016-12-12 DIAGNOSIS — T63441D Toxic effect of venom of bees, accidental (unintentional), subsequent encounter: Secondary | ICD-10-CM | POA: Diagnosis not present

## 2016-12-13 ENCOUNTER — Ambulatory Visit (INDEPENDENT_AMBULATORY_CARE_PROVIDER_SITE_OTHER): Payer: Medicaid Other | Admitting: Internal Medicine

## 2016-12-13 ENCOUNTER — Encounter (INDEPENDENT_AMBULATORY_CARE_PROVIDER_SITE_OTHER): Payer: Self-pay | Admitting: Internal Medicine

## 2016-12-13 VITALS — BP 130/86 | HR 105 | Temp 98.3°F | Resp 18 | Ht 67.0 in | Wt 242.4 lb

## 2016-12-13 DIAGNOSIS — Z6836 Body mass index (BMI) 36.0-36.9, adult: Secondary | ICD-10-CM

## 2016-12-13 DIAGNOSIS — K219 Gastro-esophageal reflux disease without esophagitis: Secondary | ICD-10-CM

## 2016-12-13 DIAGNOSIS — K5909 Other constipation: Secondary | ICD-10-CM | POA: Diagnosis not present

## 2016-12-13 MED ORDER — POLYETHYLENE GLYCOL 3350 17 GM/SCOOP PO POWD: 17.0000 g | Freq: Every day | ORAL | 5 refills | Status: DC

## 2016-12-13 MED ORDER — DOCUSATE SODIUM 100 MG PO CAPS: 200.0000 mg | ORAL_CAPSULE | Freq: Every day | ORAL | 0 refills | Status: DC

## 2016-12-13 NOTE — Patient Instructions (Signed)
Use Dulcolax suppository or Fleet enema on as-needed basis. Surgical consultation to be arranged for bariatric surgery.
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
## 2016-12-19 ENCOUNTER — Ambulatory Visit (INDEPENDENT_AMBULATORY_CARE_PROVIDER_SITE_OTHER): Payer: Medicaid Other | Admitting: *Deleted

## 2016-12-19 DIAGNOSIS — T63441D Toxic effect of venom of bees, accidental (unintentional), subsequent encounter: Secondary | ICD-10-CM | POA: Diagnosis not present

## 2016-12-21 ENCOUNTER — Telehealth (INDEPENDENT_AMBULATORY_CARE_PROVIDER_SITE_OTHER): Payer: Self-pay | Admitting: *Deleted

## 2016-12-21 NOTE — Telephone Encounter (Signed)
Spoke to Sharon Vazquez at [facility] -- they do not accept patient's with Medicaid insurance being primary -- patient is aware of this -- what would you like to do now??

## 2016-12-21 NOTE — Telephone Encounter (Signed)
She should at least see dietitian for calorie control.

## 2016-12-23 ENCOUNTER — Telehealth (INDEPENDENT_AMBULATORY_CARE_PROVIDER_SITE_OTHER): Payer: Self-pay | Admitting: Internal Medicine

## 2016-12-23 ENCOUNTER — Encounter (HOSPITAL_COMMUNITY): Payer: Self-pay | Admitting: *Deleted

## 2016-12-23 ENCOUNTER — Emergency Department (HOSPITAL_COMMUNITY)
Admission: EM | Admit: 2016-12-23 | Discharge: 2016-12-24 | Disposition: A | Discharge status: Home / Self Care | Payer: Medicaid Other | Attending: Emergency Medicine | Admitting: Emergency Medicine

## 2016-12-23 DIAGNOSIS — Z87891 Personal history of nicotine dependence: Secondary | ICD-10-CM | POA: Insufficient documentation

## 2016-12-23 DIAGNOSIS — Z79899 Other long term (current) drug therapy: Secondary | ICD-10-CM | POA: Insufficient documentation

## 2016-12-23 DIAGNOSIS — Y999 Unspecified external cause status: Secondary | ICD-10-CM | POA: Diagnosis not present

## 2016-12-23 DIAGNOSIS — Y92000 Kitchen of unspecified non-institutional (private) residence as  the place of occurrence of the external cause: Secondary | ICD-10-CM | POA: Insufficient documentation

## 2016-12-23 DIAGNOSIS — Z91010 Allergy to peanuts: Secondary | ICD-10-CM | POA: Diagnosis not present

## 2016-12-23 DIAGNOSIS — Y939 Activity, unspecified: Secondary | ICD-10-CM | POA: Diagnosis not present

## 2016-12-23 DIAGNOSIS — R51 Headache: Secondary | ICD-10-CM | POA: Diagnosis not present

## 2016-12-23 DIAGNOSIS — I10 Essential (primary) hypertension: Secondary | ICD-10-CM | POA: Insufficient documentation

## 2016-12-23 DIAGNOSIS — S7001XA Contusion of right hip, initial encounter: Secondary | ICD-10-CM | POA: Insufficient documentation

## 2016-12-23 DIAGNOSIS — Z9689 Presence of other specified functional implants: Secondary | ICD-10-CM | POA: Diagnosis not present

## 2016-12-23 DIAGNOSIS — Z91040 Latex allergy status: Secondary | ICD-10-CM | POA: Diagnosis not present

## 2016-12-23 DIAGNOSIS — J45909 Unspecified asthma, uncomplicated: Secondary | ICD-10-CM | POA: Diagnosis not present

## 2016-12-23 DIAGNOSIS — M545 Low back pain, unspecified: Secondary | ICD-10-CM

## 2016-12-23 DIAGNOSIS — M5116 Intervertebral disc disorders with radiculopathy, lumbar region: Secondary | ICD-10-CM | POA: Diagnosis not present

## 2016-12-23 DIAGNOSIS — W010XXA Fall on same level from slipping, tripping and stumbling without subsequent striking against object, initial encounter: Secondary | ICD-10-CM | POA: Diagnosis not present

## 2016-12-23 DIAGNOSIS — W19XXXA Unspecified fall, initial encounter: Secondary | ICD-10-CM

## 2016-12-23 DIAGNOSIS — Z981 Arthrodesis status: Secondary | ICD-10-CM | POA: Diagnosis not present

## 2016-12-23 NOTE — ED Triage Notes (Signed)
Pt brought in by [facility] for c/o fall; pt states she was in kitchen and fell, not sure if she tripped over her sock or lost her balance

## 2016-12-23 NOTE — Telephone Encounter (Signed)
Patient called, stated that she found a bariatric surgeon that will take her Medicaid and does not require a referral.  She wants to get your opinion on this facility.  [place], [place], [place], [place], [phone].  She is going to the mandatory seminar on Tuesday, 12/27/16 at 5:30pm.  She just wants to get your thoughts on this facility and what type of surgery you think would be best for her.  I did tell her that at the seminar she will learn of the different types of bariatric surgeries and the pros and cons of each one which will educate her on the different options available.  [phone] - per Lorenso Courier, ok to leave a message

## 2016-12-23 NOTE — Telephone Encounter (Signed)
Patient called she is attending a seminar through [facility], they accept medicaid

## 2016-12-24 ENCOUNTER — Emergency Department (HOSPITAL_COMMUNITY): Payer: Medicaid Other

## 2016-12-24 LAB — POC URINE PREG, ED: PREG TEST UR: NEGATIVE

## 2016-12-24 NOTE — ED Provider Notes (Signed)
[place] DEPT Provider Note   CSN: [id number] Arrival date & time: 12/23/16  2250     History   Chief Complaint Chief Complaint  Patient presents with  . Fall    HPI Sharon Vazquez is a 36 y.o. female.  The history is provided by the patient. No language interpreter was used.  Fall  This is a new problem. The current episode started 1 to 2 hours ago. The problem occurs constantly. The problem has been gradually worsening. Nothing aggravates the symptoms. Nothing relieves the symptoms. She has tried nothing for the symptoms. The treatment provided no relief.  Pt reports she was wearing socks and slipped and fell.  Pt reports she hit her right hip.  Pt complains of pain in her low back.  Pt reports she hit her head.  Pt reports she is dizzy and has been seeing double.    Past Medical History:  Diagnosis Date  . Abnormal Pap smear   . ADHD (attention deficit hyperactivity disorder)   . Anxiety   . Anxiety    takes Xanax daily as needed  . Arthritis    degenerative spine  . Asthma    Ventolin as needed and QVAR takes daily  . Back spasm    takes Flexeril daily as needed  . Bipolar 1 disorder ([place])   . Bipolar 1 disorder ([facility])    takes DOxepin daily  . Borderline personality disorder   . Chronic back pain    HNP  . Constipation    takes Dulcolax daily as needed takes Amitiza daily  . Contraceptive management 08/23/2013  . Cough    BROWN- GREEN THICK MUCUS  . Depression   . Eczema    has 2 creams uses as needed  . GERD (gastroesophageal reflux disease)    takes Tums as needed  . Headache(784.0)   . Headache(784.0)    migraines-last one about 61months ago;takes Topamax daily  . History of bronchitis    last time 4-38yrs ago  . History of colon polyps   . HSV-2 (herpes simplex virus 2) infection   . Hx of chlamydia infection   . Hypertension   . Hypertension    takes INderal and Clonidine daily  . IBS (irritable bowel syndrome)   . Insomnia    takes  Ambien nightly  . Internal hemorrhoids   . Irregular periods 04/02/2014  . Joint swelling    right knee  . Mental disorder    takes Abilify as needed  . Obesity   . Panic attack   . Panic disorder   . Sciatica   . Shortness of breath    with exertion  . Urinary urgency   . Weakness    numbness and tingling to left foot    Patient Active Problem List   Diagnosis Date Noted  . Toxic effect of venom 10/10/2016  . Radiculopathy 04/06/2016  . Bipolar 2 disorder, major depressive episode ([place]) 12/05/2015  . Intractable pain 12/17/2014  . Mass of breast, right 08/26/2014  . Chronic pain 06/13/2014  . Lumbar radiculopathy 04/23/2014  . Irregular periods 04/02/2014  . Displacement of lumbar intervertebral disc without myelopathy 11/01/2013  . Contraceptive management 08/23/2013  . Superficial fungus infection of skin 08/23/2013  . HNP (herniated nucleus pulposus), lumbar 07/03/2013  . Right ovarian cyst 01/22/2013  . Chronic constipation 12/17/2012  . 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  . Unspecified essential hypertension 08/07/2012  . Unspecified constipation 08/07/2012  . Bipolar disorder, unspecified (  [place]) 08/07/2012  . Attention deficit hyperactivity disorder 08/07/2012  . Panic disorder 08/07/2012  . Rectal bleeding 08/07/2012  . Abdominal pain, right upper quadrant 08/07/2012    Past Surgical History:  Procedure Laterality Date  . BACK SURGERY    . bunion removal    . BUNIONECTOMY Left    pins in big toe and 2nd toe  . CHOLECYSTECTOMY  10 yrs ago  . CHOLECYSTECTOMY  10+yrs ago  . COLONOSCOPY N/A 08/22/2012   Procedure: COLONOSCOPY;  Surgeon: Rogene Houston, MD;  [facility];  Service: Endoscopy;  Laterality: N/A;  100  . COLONOSCOPY    . COLONOSCOPY WITH PROPOFOL N/A 09/25/2015   Procedure: COLONOSCOPY WITH PROPOFOL;  Surgeon: Rogene Houston, MD;  Location: [facility];  Service: Endoscopy;  Laterality: N/A;  2:30-moved to 7:30 Ann  notified pt  . epidural injections     x 2  . LUMBAR FUSION  04/06/2016  . LUMBAR LAMINECTOMY/DECOMPRESSION MICRODISCECTOMY Left 07/03/2013   Procedure: LEFT LUMBAR THREE-FOUR microdiskectomy;  Surgeon: Winfield Cunas, MD;  Location: [place] NEURO ORS;  Service: Neurosurgery;  Laterality: Left;  LEFT LUMBAR THREE-FOUR microdiskectomy  . LUMBAR LAMINECTOMY/DECOMPRESSION MICRODISCECTOMY Left 11/29/2013   Procedure: LEFT Lumbar Four-Five Redo microdiskectomy;  Surgeon: Winfield Cunas, MD;  Location: [place] NEURO ORS;  Service: Neurosurgery;  Laterality: Left;  LEFT Lumbar Four-Five Redo microdiskectomy  . POLYPECTOMY  09/25/2015   Procedure: POLYPECTOMY;  Surgeon: Rogene Houston, MD;  [facility]: [facility];  Service: Endoscopy;;  at cecum  . SPINAL CORD STIMULATOR INSERTION N/A 06/13/2014   Procedure: LUMBAR SPINAL CORD STIMULATOR INSERTION;  Surgeon: Ashok Pall, MD;  Location: [place] NEURO ORS;  Service: Neurosurgery;  Laterality: N/A;  permanent spinal cord stimulator insertion  . SPINAL CORD STIMULATOR REMOVAL N/A 12/17/2014   Procedure: THORACIC SPINAL CORD STIMULATOR REMOVAL;  Surgeon: Ashok Pall, MD;  Location: [place] NEURO ORS;  Service: Neurosurgery;  Laterality: N/A;  THORACIC SPINAL CORD STIMULATOR REMOVAL  . SPINAL CORD STIMULATOR TRIAL N/A 04/23/2014   Procedure: LUMBAR SPINAL CORD STIMULATOR TRIAL;  Surgeon: Ashok Pall, MD;  Location: [place] NEURO ORS;  Service: Neurosurgery;  Laterality: N/A;  Spinal Cord Stimulator Trial  . TONSILLECTOMY    . TONSILLECTOMY     as a child  . wisdom teeth extracted    . WISDOM TOOTH EXTRACTION      OB History    Gravida Para Term Preterm AB Living   0             SAB TAB Ectopic Multiple Live Births                   Home Medications    Prior to Admission medications   Medication Sig Start Date End Date Taking? Authorizing Provider  albuterol (PROVENTIL HFA;VENTOLIN HFA) 108 (90 Base) MCG/ACT inhaler Inhale 1-2 puffs into the lungs every 6 (six) hours as  needed for wheezing. 08/26/16  Yes Padgett, Rae Halsted, MD  ALPRAZolam (XANAX XR) 3 MG 24 hr tablet Take 3 mg by mouth 2 (two) times daily.   Yes [provider]  cloNIDine (CATAPRES) 0.1 MG tablet Take 0.1 mg by mouth daily.    Yes [provider]  Crisaborole (EUCRISA) 2 % OINT Apply 1 application topically 2 (two) times daily. 08/26/16  Yes Padgett, Rae Halsted, MD  cyclobenzaprine (FLEXERIL) 10 MG tablet Take 10 mg by mouth 3 (three) times daily as needed for muscle spasms.   Yes [provider]  Diclofenac Potassium (CAMBIA) 50 MG PACK Take 1 each by mouth as needed. Onset of migraine   Yes [provider]  doxepin (SINEQUAN) 50 MG capsule Take 1 capsule (50 mg total) by mouth at bedtime. Patient taking differently: Take 50 mg by mouth 3 (three) times daily.  12/07/15  Yes Derrill Center, NP  DULoxetine (CYMBALTA) 60 MG capsule Take 60 mg by mouth 2 (two) times daily.   Yes [provider]  fexofenadine (ALLEGRA) 180 MG tablet Take 1 tablet (180 mg total) by mouth daily. 10/07/16  Yes Padgett, Rae Halsted, MD  fluticasone [facility]) 50 MCG/ACT nasal spray Place 2 sprays into both nostrils daily. 08/26/16  Yes Kennith Gain, MD  fluticasone (FLOVENT HFA) 110 MCG/ACT inhaler Inhale 2 puffs into the lungs daily.   Yes [provider]  gemfibrozil (LOPID) 600 MG tablet Take 600 mg by mouth 2 (two) times daily before a meal.   Yes [provider]  HYDROcodone-acetaminophen (NORCO/VICODIN) 5-325 MG tablet Take 1-2 tablets by mouth every 6 (six) hours as needed for moderate pain.   Yes [provider]  hydrocortisone (ANUSOL-HC) 25 MG suppository Place 25 mg rectally 2 (two) times daily.   Yes [provider]  ketorolac (ACULAR) 0.5 % ophthalmic solution Place 1 drop into both eyes 4 (four) times daily.   Yes [provider]  lamoTRIgine (LAMICTAL) 25 MG tablet Take 1 tablet (25 mg total) by  mouth daily. Patient taking differently: Take 100 mg by mouth daily.  12/07/15  Yes Derrill Center, NP  megestrol (MEGACE) 40 MG tablet TAKE 3 TABLETS DAILY FOR 5 DAYS, 2 TABLETS DAILY FOR 5 DAYS, THEN1 TABLET DAILY UNTIL BLEEDING STOPS. Patient taking differently: Take one tablet by mouth daily as needed for spotting 10/07/16  Yes Booker, Royetta Crochet, CNM  mometasone (ELOCON) 0.1 % ointment Apply 1 application topically 2 (two) times daily as needed (eczema).   Yes [provider]  mometasone (NASONEX) 50 MCG/ACT nasal spray Place 2 sprays into the nose daily as needed (congestion).    Yes [provider]  montelukast (SINGULAIR) 10 MG tablet Take 1 tablet (10 mg total) by mouth at bedtime. 10/04/16  Yes Padgett, Rae Halsted, MD  NEXIUM 40 MG capsule TAKE 1 CAPSULE ONCE DAILY AT 12 NOON. Patient taking differently: TAKE 1 CAPSULE ONCE DAILY 09/16/16  Yes Setzer, Terri L, NP  nystatin (NYSTATIN) powder Apply topically 4 (four) times daily.   Yes [provider]  Olopatadine HCl (PATADAY) 0.2 % SOLN Place 1 drop into both eyes daily as needed (itchy eyes). 08/26/16  Yes Padgett, Rae Halsted, MD  Plecanatide (TRULANCE) 3 MG TABS Take 3 mg by mouth daily.   Yes [provider]  prednisoLONE acetate (PRED FORTE) 1 % ophthalmic suspension Place 1 drop into the right eye 4 (four) times daily.   Yes [provider]  promethazine (PHENERGAN) 25 MG tablet Take 25 mg by mouth every 6 (six) hours as needed for nausea or vomiting.   Yes [provider]  propranolol (INDERAL) 10 MG tablet Take 10 mg by mouth 3 (three) times daily as needed (takes it twice daily and a 3rd dose if needed).    Yes [provider]  tiZANidine (ZANAFLEX) 4 MG tablet Take 4 mg by mouth every 4 (four) hours as needed for muscle spasms.   Yes [provider]  triazolam (HALCION) 0.25 MG tablet Take 0.25 mg by mouth at bedtime.    Yes  [provider]  Urea  (ALUVEA) 39 % CREA Apply 1 application topically daily as needed (to heels).    Yes [provider]  budesonide-formoterol (SYMBICORT) 160-4.5 MCG/ACT inhaler Inhale 2 puffs into the lungs 2 (two) times daily. 10/07/16   Kennith Gain, MD  diazepam (VALIUM) 5 MG tablet Take 5 mg by mouth every 6 (six) hours as needed for anxiety or muscle spasms.    [provider]  diphenhydrAMINE (BENADRYL) 25 MG tablet Take 25 mg by mouth every 6 (six) hours as needed. Patient takes 1 by mouth when she takes the pain medication.    [provider]  docusate sodium (COLACE) 100 MG capsule Take 2 capsules (200 mg total) by mouth at bedtime. 12/13/16   Rogene Houston, MD  glycerin adult 2 g suppository Place 1 suppository rectally as needed for constipation. 10/29/16   Carlisle Cater, PA-C  HYDROmorphone (DILAUDID) 2 MG tablet Take by mouth every 4 (four) hours as needed for severe pain. 1/2 - 1 tablet every 4 hours as needed for severe pain.    [provider]  medroxyPROGESTERone (DEPO-PROVERA) 150 MG/ML injection INJECT 1 VIAL INTRAMUSCULARLY EVERY 3 MONTHS IN OFFICE. 10/07/16   Derrek Monaco A, NP  ondansetron (ZOFRAN) 8 MG tablet Take 8 mg by mouth as needed for nausea or vomiting.    [provider]  polyethylene glycol powder (GLYCOLAX/MIRALAX) powder Take 17 g by mouth daily. 12/13/16   Rogene Houston, MD    Family History Family History  Problem Relation Age of Onset  . Diabetes Mother   . Thyroid disease Mother   . Other Mother        PTSD  . Hyperlipidemia Mother   . Hypertension Maternal Aunt   . Diabetes Maternal Aunt   . Thyroid disease Maternal Aunt   . Diabetes Maternal Grandmother   . Heart disease Maternal Grandmother        CHF  . Hypertension Maternal Grandmother   . Dementia Maternal Grandmother   . Diabetes Father   . Hypertension Father   . Obesity Father   . Cancer Paternal Grandmother        breast  . Alcohol abuse  Paternal Grandmother   . Crohn's disease Maternal Uncle   . Hyperlipidemia Maternal Uncle   . Other Maternal Uncle        back pain  . Dementia Maternal Uncle   . Diabetes Cousin   . Other Maternal Grandfather        lung transplant    Social History Social History  Substance Use Topics  . Smoking status: Former Smoker    Packs/day: 0.00    Years: 9.00    Types: Cigarettes  . Smokeless tobacco: Never Used     Comment: stopped 04/05/16  . Alcohol use 0.0 oz/week     Comment: OCC WINE     Allergies   Bee venom; Dilaudid [hydromorphone hcl]; Other; Peanut oil; Senna; Adhesive [tape]; and Latex   Review of Systems Review of Systems  All other systems reviewed and are negative.    Physical Exam Updated Vital Signs BP 108/81 (BP Location: Right Arm)   Pulse 93   Temp 97.9 F (36.6 C) (Oral)   Resp 18   Ht 5\' 7"  (1.702 m)   Wt 109.8 kg (242 lb)   SpO2 100%   BMI 37.90 kg/m   Physical Exam  Constitutional: She appears well-developed and well-nourished. No distress.  HENT:  Head: Normocephalic  and atraumatic.  Right Ear: External ear normal.  Left Ear: External ear normal.  Mouth/Throat: Oropharynx is clear and moist.  Eyes: Conjunctivae are normal.  Neck: Neck supple.  Cardiovascular: Normal rate and regular rhythm.   No murmur heard. Pulmonary/Chest: Effort normal and breath sounds normal. No respiratory distress.  Abdominal: Soft. There is no tenderness.  Musculoskeletal: She exhibits tenderness. She exhibits no edema.  Tender right hip to touch,  Healed incision low back.  diffusely tender,   Neurological: She is alert.  Skin: Skin is warm and dry.  Psychiatric:  Sleepy,  Speech slurred.  Nursing note and vitals reviewed.    ED Treatments / Results  Labs (all labs ordered are listed, but only abnormal results are displayed) Labs Reviewed  POC URINE PREG, ED    EKG  EKG Interpretation None       Radiology No results  found.  Procedures Procedures (including critical care time)  Medications Ordered in ED Medications - No data to display   Initial Impression / Assessment and Plan / ED Course  I have reviewed the triage vital signs and the nursing notes.  Pertinent labs & imaging results that were available during my care of the patient were reviewed by me and considered in my medical decision making (see chart for details).     Ct head, right hip and ls spine ordered.  Pt seems sedated.  She is on Oxycodone for pain  Final Clinical Impressions(s) / ED Diagnoses   Final diagnoses:  Fall, initial encounter  Contusion of right hip, initial encounter  Low back pain without sciatica, unspecified back pain laterality, unspecified chronicity    New Prescriptions New Prescriptions   No medications on file  An After Visit Summary was printed and given to the patient.   Fransico Meadow, PA-C 12/24/16 Norman Herrlich, MD 12/24/16 [phone]

## 2016-12-24 NOTE — Discharge Instructions (Signed)
Avoid sedating mediation as you hit your head.   Follow up with your Physician next week for recheck of back pain

## 2016-12-24 NOTE — ED Notes (Signed)
Ambulated patient to the bathroom with assistance from tech HV. Patient got urine sample and was able to walk back with only my assistance.

## 2016-12-26 ENCOUNTER — Ambulatory Visit (INDEPENDENT_AMBULATORY_CARE_PROVIDER_SITE_OTHER): Payer: Medicaid Other

## 2016-12-26 DIAGNOSIS — T63441D Toxic effect of venom of bees, accidental (unintentional), subsequent encounter: Secondary | ICD-10-CM | POA: Diagnosis not present

## 2016-12-27 ENCOUNTER — Other Ambulatory Visit: Payer: Self-pay | Admitting: Women's Health

## 2016-12-27 ENCOUNTER — Other Ambulatory Visit: Payer: Self-pay | Admitting: Adult Health

## 2016-12-29 NOTE — Telephone Encounter (Signed)
I have no objection. I am not familiar with that facility. Please let patient know.

## 2016-12-30 NOTE — Telephone Encounter (Signed)
I called the patient and let her know.  She actually has an appointment with them today.

## 2017-01-05 ENCOUNTER — Ambulatory Visit: Payer: Medicaid Other

## 2017-01-06 ENCOUNTER — Ambulatory Visit (INDEPENDENT_AMBULATORY_CARE_PROVIDER_SITE_OTHER): Payer: Medicaid Other | Admitting: *Deleted

## 2017-01-06 ENCOUNTER — Telehealth: Payer: Self-pay | Admitting: Obstetrics & Gynecology

## 2017-01-06 ENCOUNTER — Encounter (INDEPENDENT_AMBULATORY_CARE_PROVIDER_SITE_OTHER): Payer: Self-pay

## 2017-01-06 ENCOUNTER — Encounter: Payer: Self-pay | Admitting: *Deleted

## 2017-01-06 DIAGNOSIS — Z3202 Encounter for pregnancy test, result negative: Secondary | ICD-10-CM | POA: Diagnosis not present

## 2017-01-06 DIAGNOSIS — Z308 Encounter for other contraceptive management: Secondary | ICD-10-CM

## 2017-01-06 DIAGNOSIS — Z3042 Encounter for surveillance of injectable contraceptive: Secondary | ICD-10-CM

## 2017-01-06 LAB — POCT URINE PREGNANCY: PREG TEST UR: NEGATIVE

## 2017-01-06 MED ORDER — MEDROXYPROGESTERONE ACETATE 150 MG/ML IM SUSP
150.0000 mg | Freq: Once | INTRAMUSCULAR | Status: AC
  Administered 2017-01-06: 150 mg via INTRAMUSCULAR

## 2017-01-06 NOTE — Telephone Encounter (Signed)
Pt requesting refill on depo. Advised pt that she would need to schedule an appt for a physical after 02/02/2017 in order to get a refill. Pt verbalized understanding.

## 2017-01-06 NOTE — Progress Notes (Signed)
Pt here for Depo. Pt tolerated shot well. Return in 12 weeks for next shot. JSY 

## 2017-01-09 ENCOUNTER — Ambulatory Visit: Payer: Medicaid Other

## 2017-02-01 ENCOUNTER — Telehealth: Payer: Self-pay | Admitting: *Deleted

## 2017-02-01 NOTE — Telephone Encounter (Signed)
PA for fexofenadine 180mg  completed, approved and faxed to [place]

## 2017-02-11 ENCOUNTER — Encounter (HOSPITAL_COMMUNITY): Payer: Self-pay

## 2017-02-11 ENCOUNTER — Emergency Department (HOSPITAL_COMMUNITY)
Admission: EM | Admit: 2017-02-11 | Discharge: 2017-02-11 | Disposition: A | Discharge status: Home / Self Care | Payer: Medicaid Other | Attending: Emergency Medicine | Admitting: Emergency Medicine

## 2017-02-11 DIAGNOSIS — I10 Essential (primary) hypertension: Secondary | ICD-10-CM | POA: Diagnosis not present

## 2017-02-11 DIAGNOSIS — Z91040 Latex allergy status: Secondary | ICD-10-CM | POA: Diagnosis not present

## 2017-02-11 DIAGNOSIS — Z79899 Other long term (current) drug therapy: Secondary | ICD-10-CM | POA: Insufficient documentation

## 2017-02-11 DIAGNOSIS — L309 Dermatitis, unspecified: Secondary | ICD-10-CM | POA: Diagnosis not present

## 2017-02-11 DIAGNOSIS — R21 Rash and other nonspecific skin eruption: Secondary | ICD-10-CM | POA: Diagnosis present

## 2017-02-11 DIAGNOSIS — B369 Superficial mycosis, unspecified: Secondary | ICD-10-CM | POA: Insufficient documentation

## 2017-02-11 DIAGNOSIS — Z87891 Personal history of nicotine dependence: Secondary | ICD-10-CM | POA: Insufficient documentation

## 2017-02-11 DIAGNOSIS — Z91010 Allergy to peanuts: Secondary | ICD-10-CM | POA: Insufficient documentation

## 2017-02-11 MED ORDER — FLUCONAZOLE 100 MG PO TABS
ORAL_TABLET | ORAL | Status: AC
  Filled 2017-02-11: qty 1

## 2017-02-11 MED ORDER — NYSTATIN 100000 UNIT/GM EX CREA: TOPICAL_CREAM | CUTANEOUS | 1 refills | Status: DC

## 2017-02-11 MED ORDER — FLUCONAZOLE 100 MG PO TABS
200.0000 mg | ORAL_TABLET | Freq: Once | ORAL | Status: AC
  Administered 2017-02-11: 200 mg via ORAL
  Filled 2017-02-11: qty 2

## 2017-02-11 MED ORDER — HYDROXYZINE PAMOATE 25 MG PO CAPS: 25.0000 mg | ORAL_CAPSULE | Freq: Four times a day (QID) | ORAL | 0 refills | Status: DC | PRN

## 2017-02-11 MED ORDER — DEXAMETHASONE 4 MG PO TABS: 4.0000 mg | ORAL_TABLET | Freq: Two times a day (BID) | ORAL | 0 refills | Status: DC

## 2017-02-11 MED ORDER — DEXAMETHASONE SODIUM PHOSPHATE 10 MG/ML IJ SOLN
10.0000 mg | Freq: Once | INTRAMUSCULAR | Status: AC
  Administered 2017-02-11: 10 mg via INTRAMUSCULAR
  Filled 2017-02-11: qty 1

## 2017-02-11 MED ORDER — FLUCONAZOLE 150 MG PO TABS: 150.0000 mg | ORAL_TABLET | Freq: Every day | ORAL | 0 refills | Status: DC

## 2017-02-11 NOTE — ED Triage Notes (Signed)
Pt reports she has been using nystatin powder under breast for approx one week. Rash has gotten worse over the last 2 days and worse with powder. Also, has rash  To right side of face under eye and cheek

## 2017-02-11 NOTE — Discharge Instructions (Signed)
Please continue to use the nystatin under the breast. You were treated today with Diflucan. Initiated help with this rash. UA repeated in 4 days if needed. The rash on your face, and the itching of your extremities is probably related to an eczema type rash. You were treated today with an injection of steroid. Please use the oral steroids starting tomorrow. Use Claritin or Allegra during the day for nondrowsy itch protection. Use Vistaril every 6 hours if needed for more severe itching. This medication may cause drowsiness. Please do not drive a vehicle, operating machinery, drink alcohol, or participated in activities requiring concentration. Please see your prescribing physician for additional evaluation and management.

## 2017-02-11 NOTE — ED Provider Notes (Signed)
[facility] DEPT Provider Note   CSN: [id number] Arrival date & time: 02/11/17  1317     History   Chief Complaint Chief Complaint  Patient presents with  . Rash    HPI Sharon Vazquez is a 36 y.o. female.  Patient is a 36 year old female who presents to the emergency department with rash in multiple areas.  The patient states that she gets a rash under her breasts from time to time. She's been treated with nystatin in the past. The patient states it seems as though the rash is getting worse. The patient also complains of itching of her upper extremities. Particularly at the bends of her elbow. She also has a rash on the right side of her face. She presents now for assistance with these issues as her over-the-counter medications are not working.      Past Medical History:  Diagnosis Date  . Abnormal Pap smear   . ADHD (attention deficit hyperactivity disorder)   . Anxiety   . Anxiety    takes Xanax daily as needed  . Arthritis    degenerative spine  . Asthma    Ventolin as needed and QVAR takes daily  . Back spasm    takes Flexeril daily as needed  . Bipolar 1 disorder ([place])   . Bipolar 1 disorder ([facility])    takes DOxepin daily  . Borderline personality disorder   . Chronic back pain    HNP  . Constipation    takes Dulcolax daily as needed takes Amitiza daily  . Contraceptive management 08/23/2013  . Cough    BROWN- GREEN THICK MUCUS  . Depression   . Eczema    has 2 creams uses as needed  . GERD (gastroesophageal reflux disease)    takes Tums as needed  . Headache(784.0)   . Headache(784.0)    migraines-last one about 74months ago;takes Topamax daily  . History of bronchitis    last time 4-4yrs ago  . History of colon polyps   . HSV-2 (herpes simplex virus 2) infection   . Hx of chlamydia infection   . Hypertension   . Hypertension    takes INderal and Clonidine daily  . IBS (irritable bowel syndrome)   . Insomnia    takes Ambien nightly  .  Internal hemorrhoids   . Irregular periods 04/02/2014  . Joint swelling    right knee  . Mental disorder    takes Abilify as needed  . Obesity   . Panic attack   . Panic disorder   . Sciatica   . Shortness of breath    with exertion  . Urinary urgency   . Weakness    numbness and tingling to left foot    Patient Active Problem List   Diagnosis Date Noted  . Toxic effect of venom 10/10/2016  . Radiculopathy 04/06/2016  . Bipolar 2 disorder, major depressive episode ([place]) 12/05/2015  . Intractable pain 12/17/2014  . Mass of breast, right 08/26/2014  . Chronic pain 06/13/2014  . Lumbar radiculopathy 04/23/2014  . Irregular periods 04/02/2014  . Displacement of lumbar intervertebral disc without myelopathy 11/01/2013  . Contraceptive management 08/23/2013  . Superficial fungus infection of skin 08/23/2013  . HNP (herniated nucleus pulposus), lumbar 07/03/2013  . Right ovarian cyst 01/22/2013  . Chronic constipation 12/17/2012  . 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  . Unspecified essential hypertension 08/07/2012  . Unspecified constipation 08/07/2012  . Bipolar disorder, unspecified ([place]) 08/07/2012  . Attention deficit hyperactivity  disorder 08/07/2012  . Panic disorder 08/07/2012  . Rectal bleeding 08/07/2012  . Abdominal pain, right upper quadrant 08/07/2012    Past Surgical History:  Procedure Laterality Date  . BACK SURGERY    . bunion removal    . BUNIONECTOMY Left    pins in big toe and 2nd toe  . CHOLECYSTECTOMY  10 yrs ago  . CHOLECYSTECTOMY  10+yrs ago  . COLONOSCOPY N/A 08/22/2012   Procedure: COLONOSCOPY;  Surgeon: Rogene Houston, MD;  [facility];  Service: Endoscopy;  Laterality: N/A;  100  . COLONOSCOPY    . COLONOSCOPY WITH PROPOFOL N/A 09/25/2015   Procedure: COLONOSCOPY WITH PROPOFOL;  Surgeon: Rogene Houston, MD;  Location: [facility];  Service: Endoscopy;  Laterality: N/A;  2:30-moved to 7:30 Ann notified pt  .  epidural injections     x 2  . LUMBAR FUSION  04/06/2016  . LUMBAR LAMINECTOMY/DECOMPRESSION MICRODISCECTOMY Left 07/03/2013   Procedure: LEFT LUMBAR THREE-FOUR microdiskectomy;  Surgeon: Winfield Cunas, MD;  Location: [place] [facility];  Service: Neurosurgery;  Laterality: Left;  LEFT LUMBAR THREE-FOUR microdiskectomy  . LUMBAR LAMINECTOMY/DECOMPRESSION MICRODISCECTOMY Left 11/29/2013   Procedure: LEFT Lumbar Four-Five Redo microdiskectomy;  Surgeon: Winfield Cunas, MD;  Location: [place] [facility];  Service: Neurosurgery;  Laterality: Left;  LEFT Lumbar Four-Five Redo microdiskectomy  . POLYPECTOMY  09/25/2015   Procedure: POLYPECTOMY;  Surgeon: Rogene Houston, MD;  Location: [facility];  Service: Endoscopy;;  at cecum  . SPINAL CORD STIMULATOR INSERTION N/A 06/13/2014   Procedure: LUMBAR SPINAL CORD STIMULATOR INSERTION;  Surgeon: Ashok Pall, MD;  Location: [place] [facility];  Service: Neurosurgery;  Laterality: N/A;  permanent spinal cord stimulator insertion  . SPINAL CORD STIMULATOR REMOVAL N/A 12/17/2014   Procedure: THORACIC SPINAL CORD STIMULATOR REMOVAL;  Surgeon: Ashok Pall, MD;  Location: [place] [facility];  Service: Neurosurgery;  Laterality: N/A;  THORACIC SPINAL CORD STIMULATOR REMOVAL  . SPINAL CORD STIMULATOR TRIAL N/A 04/23/2014   Procedure: LUMBAR SPINAL CORD STIMULATOR TRIAL;  Surgeon: Ashok Pall, MD;  Location: [place] [facility] OR[facility];  Service: Neurosurgery;  Laterality: N/A;  Spinal Cord Stimulator Trial  . TONSILLECTOMY    . TONSILLECTOMY     as a child  . wisdom teeth extracted    . WISDOM TOOTH EXTRACTION      OB History    Gravida Para Term Preterm AB Living   0             SAB TAB Ectopic Multiple Live Births                   Home Medications    Prior to Admission medications   Medication Sig Start Date End Date Taking? Authorizing Provider  albuterol (PROVENTIL HFA;VENTOLIN HFA) 108 (90 Base) MCG/ACT inhaler Inhale 1-2 puffs into the lungs every 6 (six) hours as needed for  wheezing. 08/26/16  Yes Padgett, Rae Halsted, MD  ALPRAZolam (XANAX XR) 3 MG 24 hr tablet Take 3 mg by mouth 2 (two) times daily.   Yes [provider]  budesonide-formoterol (SYMBICORT) 160-4.5 MCG/ACT inhaler Inhale 2 puffs into the lungs 2 (two) times daily. 10/07/16  Yes Padgett, Rae Halsted, MD  cloNIDine (CATAPRES) 0.1 MG tablet Take 0.1 mg by mouth daily.    Yes [provider]  Crisaborole (EUCRISA) 2 % OINT Apply 1 application topically 2 (two) times daily. 08/26/16  Yes Kennith Gain, MD  diphenhydrAMINE (BENADRYL) 25 mg capsule Take 25 mg by  mouth every 6 (six) hours as needed.   Yes [provider]  doxepin (SINEQUAN) 50 MG capsule Take 1 capsule (50 mg total) by mouth at bedtime. Patient taking differently: Take 50 mg by mouth at bedtime as needed.  12/07/15  Yes Derrill Center, NP  DULoxetine (CYMBALTA) 60 MG capsule Take 60 mg by mouth 2 (two) times daily.   Yes [provider]  fexofenadine (ALLEGRA) 180 MG tablet Take 1 tablet (180 mg total) by mouth daily. 10/07/16  Yes Padgett, Rae Halsted, MD  FLUoxetine (PROZAC) 20 MG capsule Take 20 mg by mouth daily.   Yes [provider]  fluticasone (FLONASE) 50 MCG/ACT nasal spray Place 2 sprays into both nostrils daily. Patient taking differently: Place 2 sprays into both nostrils as needed.  08/26/16  Yes Kennith Gain, MD  fluticasone (FLOVENT HFA) 110 MCG/ACT inhaler Inhale 2 puffs into the lungs daily.   Yes [provider]  ketorolac (ACULAR) 0.5 % ophthalmic solution Place 1 drop into both eyes as needed.    Yes [provider]  lamoTRIgine (LAMICTAL) 25 MG tablet Take 1 tablet (25 mg total) by mouth daily. Patient taking differently: Take 100 mg by mouth daily.  12/07/15  Yes Derrill Center, NP  medroxyPROGESTERone (DEPO-PROVERA) 150 MG/ML injection INJECT 1 VIAL INTRAMUSCULARLY EVERY 3 MONTHS IN OFFICE. 12/27/16  Yes Derrek Monaco A, NP    megestrol (MEGACE) 40 MG tablet TAKE 3 TABLETS DAILY FOR 5 DAYS, 2 TABLETS DAILY FOR 5 DAYS, THEN1 TABLET DAILY UNTIL BLEEDING STOPS. 12/27/16  Yes Booker, Royetta Crochet, CNM  mometasone (ELOCON) 0.1 % ointment Apply 1 application topically 2 (two) times daily as needed (eczema).   Yes [provider]  montelukast (SINGULAIR) 10 MG tablet Take 1 tablet (10 mg total) by mouth at bedtime. 10/04/16  Yes Padgett, Rae Halsted, MD  Multiple Vitamins-Minerals (ALIVE ONCE DAILY WOMENS PO) Take by mouth daily.   Yes [provider]  NEXIUM 40 MG capsule TAKE 1 CAPSULE ONCE DAILY AT 12 NOON. Patient taking differently: TAKE 1 CAPSULE ONCE DAILY 09/16/16  Yes Setzer, Terri L, NP  nystatin (NYSTATIN) powder Apply topically as needed.    Yes [provider]  Olopatadine HCl (PATADAY) 0.2 % SOLN Place 1 drop into both eyes daily as needed (itchy eyes). 08/26/16  Yes Padgett, Rae Halsted, MD  ondansetron (ZOFRAN) 8 MG tablet Take 8 mg by mouth as needed for nausea or vomiting.   Yes [provider]  propranolol (INDERAL) 10 MG tablet Take 10 mg by mouth 3 (three) times daily as needed (takes it twice daily and a 3rd dose if needed).    Yes [provider]  triazolam (HALCION) 0.25 MG tablet Take 0.25 mg by mouth at bedtime.    Yes [provider]  Urea (ALUVEA) 39 % CREA Apply 1 application topically daily as needed (to heels).    Yes [provider]    Family History Family History  Problem Relation Age of Onset  . Diabetes Mother   . Thyroid disease Mother   . Other Mother        PTSD  . Hyperlipidemia Mother   . Hypertension Maternal Aunt   . Diabetes Maternal Aunt   . Thyroid disease Maternal Aunt   . Diabetes Maternal Grandmother   . Heart disease Maternal Grandmother        CHF  . Hypertension Maternal Grandmother   . Dementia Maternal Grandmother   . Diabetes Father   .  Hypertension Father   . Obesity Father   . Cancer  Paternal Grandmother        breast  . Alcohol abuse Paternal Grandmother   . Crohn's disease Maternal Uncle   . Hyperlipidemia Maternal Uncle   . Other Maternal Uncle        back pain  . Dementia Maternal Uncle   . Diabetes Cousin   . Other Maternal Grandfather        lung transplant    Social History Social History  Substance Use Topics  . Smoking status: Former Smoker    Packs/day: 0.00    Years: 9.00    Types: Cigarettes  . Smokeless tobacco: Never Used     Comment: stopped 04/05/16  . Alcohol use 0.0 oz/week     Comment: OCC WINE     Allergies   Bee venom; Dilaudid [hydromorphone hcl]; Hydromorphone; Other; Peanut oil; Senna; Adhesive [tape]; and Latex   Review of Systems Review of Systems  Constitutional: Negative for activity change.       All ROS Neg except as noted in HPI  HENT: Negative for nosebleeds.   Eyes: Negative for photophobia and discharge.  Respiratory: Negative for cough, shortness of breath and wheezing.   Cardiovascular: Negative for chest pain and palpitations.  Gastrointestinal: Negative for abdominal pain and blood in stool.  Genitourinary: Negative for dysuria, frequency and hematuria.  Musculoskeletal: Negative for arthralgias, back pain and neck pain.  Skin: Positive for rash.  Neurological: Negative for dizziness, seizures and speech difficulty.  Psychiatric/Behavioral: Negative for confusion and hallucinations.     Physical Exam Updated Vital Signs BP 139/83 (BP Location: Right Arm)   Pulse (!) 104   Temp 99 F (37.2 C) (Oral)   Resp 16   Ht 5\' 6"  (1.676 m)   Wt 109.8 kg (242 lb)   SpO2 100%   BMI 39.06 kg/m   Physical Exam  Constitutional: She is oriented to person, place, and time. She appears well-developed and well-nourished.  Non-toxic appearance.  HENT:  Head: Normocephalic.  Right Ear: Tympanic membrane and external ear normal.  Left Ear: Tympanic membrane and external ear normal.  Eyes: Pupils are equal, round,  and reactive to light. EOM and lids are normal.  Neck: Normal range of motion. Neck supple. Carotid bruit is not present.  Cardiovascular: Normal rate, regular rhythm, normal heart sounds, intact distal pulses and normal pulses.   Pulmonary/Chest: Breath sounds normal. No respiratory distress.  Abdominal: Soft. Bowel sounds are normal. There is no tenderness. There is no guarding.  Genitourinary:  Genitourinary Comments: Chaperone present during the examination. The patient has a dry red scaly rash under the right and left breasts on. No red streaks appreciated. No evidence of secondary infection.  There is a dry papular rash around the right eye. No red streaks appreciated. No drainage noted.  Musculoskeletal: Normal range of motion.  Lymphadenopathy:       Head (right side): No submandibular adenopathy present.       Head (left side): No submandibular adenopathy present.    She has no cervical adenopathy.  Neurological: She is alert and oriented to person, place, and time. She has normal strength. No cranial nerve deficit or sensory deficit.  Skin: Skin is warm and dry.  Patient has itching of the right and  Upper extremity. There is mild dry scaling rash of the left antecubital area.  Psychiatric: She has a normal mood and affect. Her speech is normal.  Nursing note and  vitals reviewed.    ED Treatments / Results  Labs (all labs ordered are listed, but only abnormal results are displayed) Labs Reviewed - No data to display  EKG  EKG Interpretation None       Radiology No results found.  Procedures Procedures (including critical care time)  Medications Ordered in ED Medications - No data to display   Initial Impression / Assessment and Plan / ED Course  I have reviewed the triage vital signs and the nursing notes.  Pertinent labs & imaging results that were available during my care of the patient were reviewed by me and considered in my medical decision making (see  chart for details).       Final Clinical Impressions(s) / ED Diagnoses MDM Vital signs reviewed. The patient's examination favors a fungal rash under the breasts. Patient will be treated with Diflucan. The patient has a history of eczema and has itching of the upper extremities, she also has rash with some itching on the right side of the face. Patient treated with steroids and Vistaril. Patient will follow-up with primary physician if not improving.   Final diagnoses:  Fungal rash of torso  Eczema, unspecified type    New Prescriptions Discharge Medication List as of 02/11/2017  4:10 PM    START taking these medications   Details  dexamethasone (DECADRON) 4 MG tablet Take 1 tablet (4 mg total) by mouth 2 (two) times daily with a meal., Starting Sat 02/11/2017, Print    fluconazole (DIFLUCAN) 150 MG tablet Take 1 tablet (150 mg total) by mouth daily., Starting Sat 02/11/2017, Print    hydrOXYzine (VISTARIL) 25 MG capsule Take 1 capsule (25 mg total) by mouth every 6 (six) hours as needed for itching., Starting Sat 02/11/2017, Print         Lily Kocher, PA-C 02/11/17 2220    Forde Dandy, MD 02/12/17 [phone]

## 2017-02-20 ENCOUNTER — Other Ambulatory Visit: Payer: Self-pay | Admitting: Allergy

## 2017-02-20 MED ORDER — NYSTATIN 100000 UNIT/GM EX CREA: TOPICAL_CREAM | CUTANEOUS | 1 refills | Status: DC

## 2017-02-20 NOTE — Progress Notes (Signed)
Called by pt regarding yeast infection under breast. She had been using nystatin cream which was helping but reports was not provided enough by UC and is running out and requested refilled.   Advised can refill nystatin cream and would advised if not improved to have PCP examine for other etiologies if not fungal.

## 2017-03-27 ENCOUNTER — Other Ambulatory Visit: Payer: Self-pay | Admitting: Adult Health

## 2017-03-29 ENCOUNTER — Encounter (INDEPENDENT_AMBULATORY_CARE_PROVIDER_SITE_OTHER): Payer: Self-pay

## 2017-03-29 ENCOUNTER — Ambulatory Visit (INDEPENDENT_AMBULATORY_CARE_PROVIDER_SITE_OTHER): Payer: Medicaid Other

## 2017-03-29 VITALS — Wt 323.0 lb

## 2017-03-29 DIAGNOSIS — Z3202 Encounter for pregnancy test, result negative: Secondary | ICD-10-CM | POA: Diagnosis not present

## 2017-03-29 DIAGNOSIS — Z3042 Encounter for surveillance of injectable contraceptive: Secondary | ICD-10-CM | POA: Diagnosis not present

## 2017-03-29 LAB — POCT URINE PREGNANCY: Preg Test, Ur: NEGATIVE

## 2017-03-29 MED ORDER — MEDROXYPROGESTERONE ACETATE 150 MG/ML IM SUSP
150.0000 mg | Freq: Once | INTRAMUSCULAR | Status: AC
  Administered 2017-03-29: 150 mg via INTRAMUSCULAR

## 2017-03-29 NOTE — Progress Notes (Signed)
PT here for depo shot 150 mg IM given rt deltoid. Tolerates well. Return 12 week for next shot.pad CMA

## 2017-03-31 ENCOUNTER — Ambulatory Visit: Payer: Medicaid Other

## 2017-04-06 ENCOUNTER — Other Ambulatory Visit: Payer: Medicaid Other | Admitting: Adult Health

## 2017-04-17 ENCOUNTER — Ambulatory Visit (INDEPENDENT_AMBULATORY_CARE_PROVIDER_SITE_OTHER): Payer: Medicaid Other | Admitting: Adult Health

## 2017-04-17 ENCOUNTER — Encounter: Payer: Self-pay | Admitting: Adult Health

## 2017-04-17 VITALS — BP 120/78 | HR 121 | Ht 66.5 in | Wt 238.0 lb

## 2017-04-17 DIAGNOSIS — R32 Unspecified urinary incontinence: Secondary | ICD-10-CM

## 2017-04-17 DIAGNOSIS — Z113 Encounter for screening for infections with a predominantly sexual mode of transmission: Secondary | ICD-10-CM

## 2017-04-17 DIAGNOSIS — Z3042 Encounter for surveillance of injectable contraceptive: Secondary | ICD-10-CM

## 2017-04-17 DIAGNOSIS — Z0001 Encounter for general adult medical examination with abnormal findings: Secondary | ICD-10-CM | POA: Diagnosis not present

## 2017-04-17 DIAGNOSIS — Z01411 Encounter for gynecological examination (general) (routine) with abnormal findings: Secondary | ICD-10-CM | POA: Diagnosis not present

## 2017-04-17 DIAGNOSIS — F329 Major depressive disorder, single episode, unspecified: Secondary | ICD-10-CM

## 2017-04-17 DIAGNOSIS — F32A Depression, unspecified: Secondary | ICD-10-CM

## 2017-04-17 DIAGNOSIS — Z01419 Encounter for gynecological examination (general) (routine) without abnormal findings: Secondary | ICD-10-CM

## 2017-04-17 DIAGNOSIS — N6331 Unspecified lump in axillary tail of the right breast: Secondary | ICD-10-CM

## 2017-04-17 LAB — POCT URINALYSIS DIPSTICK
Glucose, UA: NEGATIVE
Ketones, UA: NEGATIVE
Leukocytes, UA: NEGATIVE
NITRITE UA: NEGATIVE
Protein, UA: NEGATIVE
RBC UA: NEGATIVE

## 2017-04-17 MED ORDER — OXYBUTYNIN CHLORIDE 5 MG PO TABS: 5.0000 mg | ORAL_TABLET | Freq: Three times a day (TID) | ORAL | 1 refills | Status: DC

## 2017-04-17 MED ORDER — MEDROXYPROGESTERONE ACETATE 150 MG/ML IM SUSP: INTRAMUSCULAR | 4 refills | Status: DC

## 2017-04-17 NOTE — Patient Instructions (Signed)
Get mammogram 11/27 at 9 am  Physical in 1 year Pap in 2020
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
## 2017-04-18 ENCOUNTER — Ambulatory Visit (INDEPENDENT_AMBULATORY_CARE_PROVIDER_SITE_OTHER): Payer: Medicaid Other | Admitting: Internal Medicine

## 2017-04-18 ENCOUNTER — Encounter (INDEPENDENT_AMBULATORY_CARE_PROVIDER_SITE_OTHER): Payer: Self-pay | Admitting: Internal Medicine

## 2017-04-18 VITALS — BP 150/100 | HR 80 | Temp 97.6°F | Ht 67.0 in | Wt 235.7 lb

## 2017-04-18 DIAGNOSIS — K640 First degree hemorrhoids: Secondary | ICD-10-CM | POA: Diagnosis not present

## 2017-04-18 DIAGNOSIS — K625 Hemorrhage of anus and rectum: Secondary | ICD-10-CM | POA: Diagnosis not present

## 2017-04-18 NOTE — Patient Instructions (Signed)
Will discuss with Dr.Rehman. 
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
## 2017-04-19 LAB — GC/CHLAMYDIA PROBE AMP
CHLAMYDIA, DNA PROBE: NEGATIVE
Neisseria gonorrhoeae by PCR: NEGATIVE

## 2017-04-20 ENCOUNTER — Telehealth (INDEPENDENT_AMBULATORY_CARE_PROVIDER_SITE_OTHER): Payer: Self-pay | Admitting: Internal Medicine

## 2017-04-20 NOTE — Telephone Encounter (Signed)
I advised her I would talk with DR. Rehman

## 2017-04-20 NOTE — Telephone Encounter (Signed)
Patient called stated that you had mentioned referring her somewhere for hemorrhoid banding.  She is checking on this.  [phone]

## 2017-04-21 ENCOUNTER — Telehealth (INDEPENDENT_AMBULATORY_CARE_PROVIDER_SITE_OTHER): Payer: Self-pay | Admitting: Internal Medicine

## 2017-04-21 ENCOUNTER — Other Ambulatory Visit (INDEPENDENT_AMBULATORY_CARE_PROVIDER_SITE_OTHER): Payer: Self-pay | Admitting: Internal Medicine

## 2017-04-21 DIAGNOSIS — K649 Unspecified hemorrhoids: Secondary | ICD-10-CM

## 2017-04-21 NOTE — Telephone Encounter (Signed)
I spoke with patient. Referral to [facility].

## 2017-04-26 ENCOUNTER — Emergency Department (HOSPITAL_COMMUNITY)
Admission: EM | Admit: 2017-04-26 | Discharge: 2017-04-26 | Disposition: A | Discharge status: Home / Self Care | Payer: Medicaid Other | Attending: Emergency Medicine | Admitting: Emergency Medicine

## 2017-04-26 ENCOUNTER — Other Ambulatory Visit: Payer: Self-pay

## 2017-04-26 ENCOUNTER — Telehealth: Payer: Self-pay | Admitting: *Deleted

## 2017-04-26 ENCOUNTER — Encounter (HOSPITAL_COMMUNITY): Payer: Self-pay | Admitting: Emergency Medicine

## 2017-04-26 ENCOUNTER — Emergency Department (HOSPITAL_COMMUNITY): Payer: Medicaid Other

## 2017-04-26 ENCOUNTER — Emergency Department (HOSPITAL_COMMUNITY)
Admission: EM | Admit: 2017-04-26 | Discharge: 2017-04-27 | Disposition: A | Discharge status: Home / Self Care | Payer: Medicaid Other | Source: Home / Self Care | Attending: Emergency Medicine | Admitting: Emergency Medicine

## 2017-04-26 DIAGNOSIS — R1030 Lower abdominal pain, unspecified: Secondary | ICD-10-CM | POA: Diagnosis not present

## 2017-04-26 DIAGNOSIS — Z87891 Personal history of nicotine dependence: Secondary | ICD-10-CM | POA: Insufficient documentation

## 2017-04-26 DIAGNOSIS — J4540 Moderate persistent asthma, uncomplicated: Secondary | ICD-10-CM | POA: Insufficient documentation

## 2017-04-26 DIAGNOSIS — Z91040 Latex allergy status: Secondary | ICD-10-CM | POA: Diagnosis not present

## 2017-04-26 DIAGNOSIS — Z79899 Other long term (current) drug therapy: Secondary | ICD-10-CM | POA: Diagnosis not present

## 2017-04-26 DIAGNOSIS — J45909 Unspecified asthma, uncomplicated: Secondary | ICD-10-CM | POA: Diagnosis not present

## 2017-04-26 DIAGNOSIS — R102 Pelvic and perineal pain: Secondary | ICD-10-CM

## 2017-04-26 DIAGNOSIS — R202 Paresthesia of skin: Secondary | ICD-10-CM | POA: Insufficient documentation

## 2017-04-26 DIAGNOSIS — M1990 Unspecified osteoarthritis, unspecified site: Secondary | ICD-10-CM | POA: Insufficient documentation

## 2017-04-26 DIAGNOSIS — R339 Retention of urine, unspecified: Secondary | ICD-10-CM | POA: Diagnosis present

## 2017-04-26 DIAGNOSIS — R0602 Shortness of breath: Secondary | ICD-10-CM | POA: Insufficient documentation

## 2017-04-26 DIAGNOSIS — N3000 Acute cystitis without hematuria: Secondary | ICD-10-CM

## 2017-04-26 DIAGNOSIS — I10 Essential (primary) hypertension: Secondary | ICD-10-CM | POA: Insufficient documentation

## 2017-04-26 DIAGNOSIS — F89 Unspecified disorder of psychological development: Secondary | ICD-10-CM | POA: Insufficient documentation

## 2017-04-26 DIAGNOSIS — K219 Gastro-esophageal reflux disease without esophagitis: Secondary | ICD-10-CM | POA: Insufficient documentation

## 2017-04-26 LAB — URINALYSIS, ROUTINE W REFLEX MICROSCOPIC
BILIRUBIN URINE: NEGATIVE
Bilirubin Urine: NEGATIVE
GLUCOSE, UA: NEGATIVE mg/dL
Glucose, UA: NEGATIVE mg/dL
HGB URINE DIPSTICK: NEGATIVE
Ketones, ur: NEGATIVE mg/dL
Ketones, ur: NEGATIVE mg/dL
Leukocytes, UA: NEGATIVE
Nitrite: NEGATIVE
Nitrite: NEGATIVE
PH: 6 (ref 5.0–8.0)
PROTEIN: NEGATIVE mg/dL
PROTEIN: 30 mg/dL — AB
SQUAMOUS EPITHELIAL / LPF: NONE SEEN
Specific Gravity, Urine: 1.01 (ref 1.005–1.030)
Specific Gravity, Urine: 1.016 (ref 1.005–1.030)
pH: 5 (ref 5.0–8.0)

## 2017-04-26 LAB — COMPREHENSIVE METABOLIC PANEL
ALBUMIN: 3.9 g/dL (ref 3.5–5.0)
ALT: 23 U/L (ref 14–54)
AST: 34 U/L (ref 15–41)
Alkaline Phosphatase: 78 U/L (ref 38–126)
Anion gap: 11 (ref 5–15)
BUN: 8 mg/dL (ref 6–20)
CHLORIDE: 103 mmol/L (ref 101–111)
CO2: 20 mmol/L — AB (ref 22–32)
CREATININE: 1.24 mg/dL — AB (ref 0.44–1.00)
Calcium: 9.3 mg/dL (ref 8.9–10.3)
GFR calc non Af Amer: 55 mL/min — ABNORMAL LOW (ref >60)
GLUCOSE: 211 mg/dL — AB (ref 65–99)
Potassium: 4.4 mmol/L (ref 3.5–5.1)
SODIUM: 134 mmol/L — AB (ref 135–145)
Total Bilirubin: 0.5 mg/dL (ref 0.3–1.2)
Total Protein: 7 g/dL (ref 6.5–8.1)

## 2017-04-26 LAB — CBC
HCT: 36.9 % (ref 36.0–46.0)
Hemoglobin: 12.9 g/dL (ref 12.0–15.0)
MCH: 30.3 pg (ref 26.0–34.0)
MCHC: 35 g/dL (ref 30.0–36.0)
MCV: 86.6 fL (ref 78.0–100.0)
PLATELETS: 447 10*3/uL — AB (ref 150–400)
RBC: 4.26 MIL/uL (ref 3.87–5.11)
RDW: 15 % (ref 11.5–15.5)
WBC: 12.7 10*3/uL — ABNORMAL HIGH (ref 4.0–10.5)

## 2017-04-26 LAB — PREGNANCY, URINE: Preg Test, Ur: NEGATIVE

## 2017-04-26 LAB — I-STAT BETA HCG BLOOD, ED (MC, WL, AP ONLY)

## 2017-04-26 MED ORDER — ALBUTEROL SULFATE (2.5 MG/3ML) 0.083% IN NEBU
5.0000 mg | INHALATION_SOLUTION | Freq: Once | RESPIRATORY_TRACT | Status: AC
  Administered 2017-04-26: 5 mg via RESPIRATORY_TRACT
  Filled 2017-04-26: qty 6

## 2017-04-26 MED ORDER — IPRATROPIUM-ALBUTEROL 0.5-2.5 (3) MG/3ML IN SOLN
3.0000 mL | Freq: Once | RESPIRATORY_TRACT | Status: AC
  Administered 2017-04-26: 3 mL via RESPIRATORY_TRACT
  Filled 2017-04-26: qty 3

## 2017-04-26 MED ORDER — KETOROLAC TROMETHAMINE 60 MG/2ML IM SOLN
60.0000 mg | Freq: Once | INTRAMUSCULAR | Status: AC
  Administered 2017-04-26: 60 mg via INTRAMUSCULAR
  Filled 2017-04-26: qty 2

## 2017-04-26 MED ORDER — DEXAMETHASONE SODIUM PHOSPHATE 10 MG/ML IJ SOLN
10.0000 mg | Freq: Once | INTRAMUSCULAR | Status: AC
  Administered 2017-04-26: 10 mg via INTRAMUSCULAR
  Filled 2017-04-26: qty 1
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
## 2017-04-26 NOTE — ED Notes (Signed)
Pt has a leg bag with a urinary cath. States that it was placed earlier today due to urinary retention. States that she is having a pain lower, mid abd. Also c/o metallic taste in her mouth.

## 2017-04-26 NOTE — Discharge Instructions (Signed)
Follow-up with the referred Urologist for evaluation. Leave the catheter in until you are able to see the Urologist.   Return to the Emergency Department for any worsening pain, difficulty urinating, blood in the urine.

## 2017-04-26 NOTE — ED Triage Notes (Signed)
Patient reports hypogastric pain  with urinary retention  for several days , mild nausea , denies emesis or diarrhea , no fever or chills , pt. has an indwelling urinary catheter / leg bag .

## 2017-04-26 NOTE — ED Notes (Signed)
Bladder scan result: 354 mL

## 2017-04-26 NOTE — Telephone Encounter (Signed)
Per chart review patient being cared for in the Emergency Room, no further intervention needed from [place].

## 2017-04-26 NOTE — ED Triage Notes (Signed)
Pt states she was started on Ditropram (?) to help her with urinary incontinence problems. Pt states she has not urinated since 8pm last night and is unable to go. She is in a lot of pain because she feels the urge to urinate, but cannot release it. Pt states when she was walking into the ED, she became very SOB. Denies CP.

## 2017-04-27 ENCOUNTER — Emergency Department (HOSPITAL_COMMUNITY): Payer: Medicaid Other

## 2017-04-27 MED ORDER — FLUCONAZOLE 150 MG PO TABS: 150.0000 mg | ORAL_TABLET | Freq: Every day | ORAL | 0 refills | Status: DC

## 2017-04-27 MED ORDER — CIPROFLOXACIN HCL 500 MG PO TABS: 500.0000 mg | ORAL_TABLET | Freq: Two times a day (BID) | ORAL | 0 refills | Status: DC

## 2017-04-27 NOTE — ED Notes (Signed)
Pt has a foley leg bag that is leaking around the hub that releases the urine. But no leg bags in the clean supply room.

## 2017-04-27 NOTE — Discharge Instructions (Signed)
Cipro as prescribed.  Follow-up with urology on Monday as previously arranged.
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
## 2017-04-27 NOTE — ED Notes (Signed)
DC instructions given but pt wants to see if she can have RX for yeast infection due to Cipro "always causes me a yeast infection". Will speak with md.

## 2017-04-29 ENCOUNTER — Emergency Department (HOSPITAL_COMMUNITY): Payer: Medicaid Other

## 2017-04-29 ENCOUNTER — Encounter (HOSPITAL_COMMUNITY): Payer: Self-pay

## 2017-04-29 ENCOUNTER — Emergency Department (HOSPITAL_COMMUNITY)
Admission: EM | Admit: 2017-04-29 | Discharge: 2017-04-29 | Disposition: A | Discharge status: Home / Self Care | Payer: Medicaid Other | Attending: Emergency Medicine | Admitting: Emergency Medicine

## 2017-04-29 DIAGNOSIS — J45909 Unspecified asthma, uncomplicated: Secondary | ICD-10-CM | POA: Insufficient documentation

## 2017-04-29 DIAGNOSIS — B373 Candidiasis of vulva and vagina: Secondary | ICD-10-CM | POA: Diagnosis not present

## 2017-04-29 DIAGNOSIS — Z79899 Other long term (current) drug therapy: Secondary | ICD-10-CM | POA: Diagnosis not present

## 2017-04-29 DIAGNOSIS — R140 Abdominal distension (gaseous): Secondary | ICD-10-CM | POA: Insufficient documentation

## 2017-04-29 DIAGNOSIS — Z87891 Personal history of nicotine dependence: Secondary | ICD-10-CM | POA: Insufficient documentation

## 2017-04-29 DIAGNOSIS — R1031 Right lower quadrant pain: Secondary | ICD-10-CM | POA: Diagnosis present

## 2017-04-29 DIAGNOSIS — Z91040 Latex allergy status: Secondary | ICD-10-CM | POA: Diagnosis not present

## 2017-04-29 DIAGNOSIS — B3731 Acute candidiasis of vulva and vagina: Secondary | ICD-10-CM

## 2017-04-29 DIAGNOSIS — I10 Essential (primary) hypertension: Secondary | ICD-10-CM | POA: Insufficient documentation

## 2017-04-29 DIAGNOSIS — Z91010 Allergy to peanuts: Secondary | ICD-10-CM | POA: Insufficient documentation

## 2017-04-29 LAB — CBC WITH DIFFERENTIAL/PLATELET
BASOS PCT: 0 %
Basophils Absolute: 0 10*3/uL (ref 0.0–0.1)
EOS ABS: 0.2 10*3/uL (ref 0.0–0.7)
EOS PCT: 2 %
HCT: 34.7 % — ABNORMAL LOW (ref 36.0–46.0)
HEMOGLOBIN: 11.9 g/dL — AB (ref 12.0–15.0)
LYMPHS ABS: 3.5 10*3/uL (ref 0.7–4.0)
Lymphocytes Relative: 35 %
MCH: 30.1 pg (ref 26.0–34.0)
MCHC: 34.3 g/dL (ref 30.0–36.0)
MCV: 87.6 fL (ref 78.0–100.0)
Monocytes Absolute: 0.5 10*3/uL (ref 0.1–1.0)
Monocytes Relative: 5 %
NEUTROS PCT: 58 %
Neutro Abs: 5.9 10*3/uL (ref 1.7–7.7)
PLATELETS: 382 10*3/uL (ref 150–400)
RBC: 3.96 MIL/uL (ref 3.87–5.11)
RDW: 15.1 % (ref 11.5–15.5)
WBC: 10.1 10*3/uL (ref 4.0–10.5)

## 2017-04-29 LAB — URINALYSIS, ROUTINE W REFLEX MICROSCOPIC
BACTERIA UA: NONE SEEN
BILIRUBIN URINE: NEGATIVE
Glucose, UA: NEGATIVE mg/dL
Ketones, ur: NEGATIVE mg/dL
NITRITE: NEGATIVE
Protein, ur: NEGATIVE mg/dL
Specific Gravity, Urine: 1.011 (ref 1.005–1.030)
pH: 7 (ref 5.0–8.0)

## 2017-04-29 LAB — BASIC METABOLIC PANEL
Anion gap: 9 (ref 5–15)
BUN: 9 mg/dL (ref 6–20)
CHLORIDE: 101 mmol/L (ref 101–111)
CO2: 25 mmol/L (ref 22–32)
CREATININE: 0.97 mg/dL (ref 0.44–1.00)
Calcium: 9 mg/dL (ref 8.9–10.3)
Glucose, Bld: 96 mg/dL (ref 65–99)
POTASSIUM: 4.3 mmol/L (ref 3.5–5.1)
SODIUM: 135 mmol/L (ref 135–145)

## 2017-04-29 LAB — PREGNANCY, URINE: PREG TEST UR: NEGATIVE

## 2017-04-29 MED ORDER — IOPAMIDOL (ISOVUE-300) INJECTION 61%
INTRAVENOUS | Status: AC
  Administered 2017-04-29: 100 mL
  Filled 2017-04-29: qty 100

## 2017-04-29 MED ORDER — FLUCONAZOLE 150 MG PO TABS: 150.0000 mg | ORAL_TABLET | Freq: Every day | ORAL | 0 refills | Status: DC

## 2017-04-29 MED ORDER — SODIUM CHLORIDE 0.9 % IV SOLN
INTRAVENOUS | Status: DC
  Administered 2017-04-29: 16:00:00 via INTRAVENOUS

## 2017-04-29 MED ORDER — LORAZEPAM 2 MG/ML IJ SOLN
1.0000 mg | Freq: Once | INTRAMUSCULAR | Status: AC
  Administered 2017-04-29: 1 mg via INTRAVENOUS
  Filled 2017-04-29: qty 1

## 2017-04-29 NOTE — ED Notes (Signed)
Patient transported to CT 
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
## 2017-04-29 NOTE — ED Notes (Signed)
Pt sleeping. 

## 2017-04-29 NOTE — ED Notes (Signed)
After foley catheter removed pt ambulated to restroom and successfully urinated without difficulty

## 2017-04-29 NOTE — ED Triage Notes (Signed)
Patient complains of ongoing dysuria and bladder pressure since having foley placed this past Wednesday for retention. Bladder emptying during triage assessment, clear urine noted in bag, NAD

## 2017-04-30 LAB — URINE CULTURE: Culture: NO GROWTH

## 2017-05-02 ENCOUNTER — Ambulatory Visit (HOSPITAL_COMMUNITY)
Admission: RE | Admit: 2017-05-02 | Discharge: 2017-05-02 | Disposition: A | Discharge status: Home / Self Care | Payer: Medicaid Other | Source: Ambulatory Visit | Attending: Adult Health | Admitting: Adult Health

## 2017-05-02 DIAGNOSIS — N6331 Unspecified lump in axillary tail of the right breast: Secondary | ICD-10-CM | POA: Diagnosis present

## 2017-05-02 DIAGNOSIS — R928 Other abnormal and inconclusive findings on diagnostic imaging of breast: Secondary | ICD-10-CM | POA: Diagnosis not present

## 2017-05-04 ENCOUNTER — Encounter: Payer: Self-pay | Admitting: Allergy

## 2017-05-04 ENCOUNTER — Ambulatory Visit (INDEPENDENT_AMBULATORY_CARE_PROVIDER_SITE_OTHER): Payer: Medicaid Other | Admitting: Allergy

## 2017-05-04 VITALS — BP 130/74 | HR 99 | Temp 98.2°F | Resp 19

## 2017-05-04 DIAGNOSIS — H1010 Acute atopic conjunctivitis, unspecified eye: Secondary | ICD-10-CM

## 2017-05-04 DIAGNOSIS — J4531 Mild persistent asthma with (acute) exacerbation: Secondary | ICD-10-CM | POA: Diagnosis not present

## 2017-05-04 DIAGNOSIS — L2084 Intrinsic (allergic) eczema: Secondary | ICD-10-CM | POA: Diagnosis not present

## 2017-05-04 DIAGNOSIS — J309 Allergic rhinitis, unspecified: Secondary | ICD-10-CM | POA: Diagnosis not present

## 2017-05-04 DIAGNOSIS — T6391XD Toxic effect of contact with unspecified venomous animal, accidental (unintentional), subsequent encounter: Secondary | ICD-10-CM | POA: Diagnosis not present

## 2017-05-04 MED ORDER — ALBUTEROL SULFATE (2.5 MG/3ML) 0.083% IN NEBU
2.5000 mg | INHALATION_SOLUTION | RESPIRATORY_TRACT | 1 refills | Status: DC | PRN
Start: 2017-05-04 — End: 2018-01-12

## 2017-05-04 MED ORDER — ALBUTEROL SULFATE HFA 108 (90 BASE) MCG/ACT IN AERS: 1.0000 | INHALATION_SPRAY | Freq: Four times a day (QID) | RESPIRATORY_TRACT | 1 refills | Status: DC | PRN

## 2017-05-04 NOTE — Progress Notes (Signed)
Follow-up Note  RE: Sharon Vazquez MRN: [id number] DOB: [birth year] Date of Office Visit: 05/04/2017   History of present illness: Sharon Vazquez is a 36 y.o. female presenting today for sick visit.  She has history of asthma and is currently flaring.  She states the past couple of weeks she has had some shortness of breath and wheezing and has been more fatigued.  She also complains of cough and chest tightness.  States she has needed to use her pro-air "a couple of times in the past 2-3 weeks ".  She states last night she needed to use extra of her Symbicort as well as her pro-air as she felt like she was having worsening shortness of breath.  She continues to take her Symbicort 2 puffs twice a day and her singular daily.  She states she was seen in the ED on 04/26/2017 for urinary retention and suprapubic pain.  She was seen again in the ED on 04/29/2017 that she reports for her breathing difficulties however the ED report only notes symptoms of abdominal distention and right lower quadrant pain with urinary retention.  On exam she was noted to have normal respiratory effort and normal breath sounds without stridor or respiratory distress.  No decreased breath sounds, no wheezes, no rhonchi, no rales.  She was noted to have tenderness in the right lower quadrant with no rebound or CVA tenderness.  She had an abdominal CT that was negative for any acute findings.  Her bladder was decompressed with use of a Foley.  Urinalysis was normal.  She had reported cottage cheeselike discharge that she was treated preemptively for a yeast infection. She states that her eczema and allergy symptoms have been pretty much well controlled.  She takes Allegra and has olopatadine for as needed use.  For as needed use for her eczema as well as mometasone for as needed.   She used to be on a venom immunotherapy but stopped over the summer as she reports she was having transportation issues and could not get here  for her injections.     Review of systems: Review of Systems  Constitutional: Positive for fever and malaise/fatigue. Negative for chills.  HENT: Negative for congestion, ear discharge, ear pain, nosebleeds, sinus pain, sore throat and tinnitus.   Eyes: Negative for pain, discharge and redness.  Respiratory: Positive for cough, shortness of breath and wheezing. Negative for hemoptysis and sputum production.   Cardiovascular: Negative for chest pain.  Gastrointestinal: Negative for abdominal pain, constipation, diarrhea, nausea and vomiting.  Musculoskeletal: Positive for back pain.  Skin: Negative for itching and rash.  Neurological: Negative for headaches.    All other systems negative unless noted above in HPI  Past medical/social/surgical/family history have been reviewed and are unchanged unless specifically indicated below.  No changes  Medication List: Allergies as of 05/04/2017      Reactions   Bee Venom Anaphylaxis   Dilaudid [hydromorphone Hcl] Anaphylaxis, Hives   Has tolerated morphine since this reaction   Hydromorphone Anaphylaxis, Hives   Other Anaphylaxis   Bandaid, Nuts, Peanut Oil    Peanut Oil Anaphylaxis   Senna Anaphylaxis, Hives   Adhesive [tape] Hives, Other (See Comments)   Pulls skin off (use paper tape)   Latex Hives      Medication List        Accurate as of 05/04/17  5:41 PM. Always use your most recent med list.  albuterol 108 (90 Base) MCG/ACT inhaler Commonly known as:  PROVENTIL HFA;VENTOLIN HFA Inhale 1-2 puffs into the lungs every 6 (six) hours as needed for wheezing.   ALIVE ONCE DAILY WOMENS PO Take 1 tablet by mouth daily.   ALPRAZolam 1 MG 24 hr tablet Commonly known as:  XANAX XR Take 1 mg by mouth 3 (three) times daily.   ALUVEA 39 % Crea Generic drug:  Urea Apply 1 application topically daily as needed (to heels).   budesonide-formoterol 160-4.5 MCG/ACT inhaler Commonly known as:  SYMBICORT Inhale 2 puffs  into the lungs 2 (two) times daily.   cloNIDine 0.1 MG tablet Commonly known as:  CATAPRES Take 0.1 mg by mouth daily.   cyclobenzaprine 10 MG tablet Commonly known as:  FLEXERIL Take 10 mg 2 (two) times daily by mouth.   dexamethasone 4 MG tablet Commonly known as:  DECADRON Take 1 tablet (4 mg total) by mouth 2 (two) times daily with a meal.   diphenhydrAMINE 25 mg capsule Commonly known as:  BENADRYL Take 25 mg by mouth every 6 (six) hours as needed for itching or allergies.   doxepin 50 MG capsule Commonly known as:  SINEQUAN Take 1 capsule (50 mg total) by mouth at bedtime.   DULoxetine 60 MG capsule Commonly known as:  CYMBALTA Take 60 mg by mouth 2 (two) times daily.   fexofenadine 180 MG tablet Commonly known as:  ALLEGRA Take 1 tablet (180 mg total) by mouth daily.   FLUoxetine 20 MG capsule Commonly known as:  PROZAC Take 20 mg by mouth daily.   fluticasone 110 MCG/ACT inhaler Commonly known as:  FLOVENT HFA Inhale 2 puffs into the lungs daily.   ketorolac 0.5 % ophthalmic solution Commonly known as:  ACULAR Place 1 drop into both eyes daily as needed (eye care).   lamoTRIgine 25 MG tablet Commonly known as:  LAMICTAL Take 1 tablet (25 mg total) by mouth daily.   medroxyPROGESTERone 150 MG/ML injection Commonly known as:  DEPO-PROVERA INJECT 1 VIAL INTRAMUSCULARLY EVERY 3 MONTHS IN OFFICE.   mometasone 0.1 % ointment Commonly known as:  ELOCON Apply 1 application topically 2 (two) times daily as needed (eczema).   montelukast 10 MG tablet Commonly known as:  SINGULAIR Take 1 tablet (10 mg total) by mouth at bedtime.   NEXIUM 40 MG capsule Generic drug:  esomeprazole TAKE 1 CAPSULE ONCE DAILY AT 12 NOON.   nystatin cream Commonly known as:  MYCOSTATIN Apply to affected area 2 times daily   Olopatadine HCl 0.2 % Soln Commonly known as:  PATADAY Place 1 drop into both eyes daily as needed (itchy eyes).   ondansetron 8 MG tablet Commonly  known as:  ZOFRAN Take 8 mg by mouth every 8 (eight) hours as needed for nausea or vomiting.   oxybutynin 5 MG tablet Commonly known as:  DITROPAN Take 1 tablet (5 mg total) 3 (three) times daily by mouth.   propranolol 10 MG tablet Commonly known as:  INDERAL Take 10 mg by mouth 3 (three) times daily as needed (takes it twice daily and a 3rd dose if needed).   triazolam 0.25 MG tablet Commonly known as:  HALCION Take 0.25 mg by mouth at bedtime.       Known medication allergies: Allergies  Allergen Reactions  . Bee Venom Anaphylaxis  . Dilaudid [Hydromorphone Hcl] Anaphylaxis and Hives    Has tolerated morphine since this reaction  . Hydromorphone Anaphylaxis and Hives  . Other Anaphylaxis  Bandaid, Nuts, Peanut Oil   . Peanut Oil Anaphylaxis  . Senna Anaphylaxis and Hives  . Adhesive [Tape] Hives and Other (See Comments)    Pulls skin off (use paper tape)  . Latex Hives     Physical examination: Blood pressure 130/74, pulse 99, temperature 98.2 F (36.8 C), resp. rate 19, SpO2 98 %.  General: sleeping upon start of encounter requiring gentle shake to awaken, interactive, in no acute distress. HEENT: PERRLA, TMs pearly gray, turbinates mildly edematous without discharge, post-pharynx non erythematous. Neck: Supple without lymphadenopathy. Lungs: Decreased breath sounds bilaterally without wheezing, rhonchi or rales. {no increased work of breathing.  Following DuoNeb she had increased aeration throughout. CV: Normal S1, S2 without murmurs. Abdomen: Nondistended, nontender. Skin: Warm and dry, without lesions or rashes. Extremities:  No clubbing, cyanosis or edema. Neuro:   Grossly intact.  Diagnositics/Labs:   Spirometry: FEV1: 2.03L  74%, FVC: 3.12L  94% following DuoNeb she had improvement in FEV1 to 3.23 L 417% which was a 59% increase which is a very significant  Assessment and plan:   Asthma with acute exacerbation    - keep up the good work with not  smoking!!    - continue Symbicort 160 mcg take 2 puffs twice a day    - during asthma flares/respiratory illnesses add Flovent 110  Take 2 puffs twice a day with your Symbicort then stop once symptoms have improved and continue with twice a day use of your Symbicort    - continue albuterol inhaler 2 puffs or nebulizer 1 vial every 4-6 hours as needed for cough, wheeze, shortness of breath, chest tightness    - continue Singulair 10mg  take at bedtime    - take prednisone 40mg  x 5 days - pack provided  Asthma control goals:   Full participation in all desired activities (may need albuterol before activity)  Albuterol use two time or less a week on average (not counting use with activity)  Cough interfering with sleep two time or less a month  Oral steroids no more than once a year  No hospitalizations   Eczema     - continue Eucrisa apply thin layer to affected area up to twice a day as needed.  May use all over body.  May use in addition with topical steroid creams.     - continue mometasone 0.1% apply thin layer to affected area up to twice a day.  Do not use on face, arm pits or genitalia    - moisturize daily with emollients like Vaseline, Aquafor, Eucerin  Allergies    - continue allergen avoidance measures for dust mites, dog, grasses, cockroach, mold, trees, weeds    - use Allegra 180mg  daily     - flonase 1-2 sprays as needed for nasal congestion    - use nasal saline spray into each nostril to help moisturize the nose    - Olopatadine 1 drop each eye as needed for itchy/red/watery eyes  Hymenoptera allergy    - recommend resuming venom shots once asthma is under better control however she has had issues with transportation thus this may continue to be a problem with access.    - avoid stinging insects as much as possible    - have access to epipen in case of allergic reaction    - follow emergency action plan in case of reaction  Follow-up 3-4 months or sooner if  needed  I appreciate the opportunity to take part in Sharon Vazquez's care. Please do  not hesitate to contact me with questions.  Sincerely,   Prudy Feeler, MD Allergy/Immunology [facility]

## 2017-05-04 NOTE — Patient Instructions (Addendum)
Asthma with acute exacerbation    - keep up the good work with not smoking!!    - continue Symbicort 160 mcg take 2 puffs twice a day    - during asthma flares/respiratory illnesses add Flovent 110  Take 2 puffs twice a day with your Symbicort then stop once symptoms have improved     - continue albuterol inhaler 2 puffs or nebulizer 1 vial every 4-6 hours as needed for cough, wheeze, shortness of breath, chest tightness    - continue Singulair 10mg  take at bedtime    - take prednisone 40mg  x 5 days - pack provided  Asthma control goals:   Full participation in all desired activities (may need albuterol before activity)  Albuterol use two time or less a week on average (not counting use with activity)  Cough interfering with sleep two time or less a month  Oral steroids no more than once a year  No hospitalizations   Eczema     - continue Eucrisa apply thin layer to affected area up to twice a day as needed.  May use all over body.  May use in addition with topical steroid creams.     - continue mometasone 0.1% apply thin layer to affected area up to twice a day.  Do not use on face, arm pits or genitalia    - moisturize daily with emollients like Vaseline, Aquafor, Eucerin  Allergies    - continue allergen avoidance measures for dust mites, dog, grasses, cockroach, mold, trees, weeds    - use Allegra 180mg  daily     - flonase 1-2 sprays as needed for nasal congestion    - use nasal saline spray into each nostril to help moisturize the nose    - Olopatadine 1 drop each eye as needed for itchy/red/watery eyes  Bee allergy    - recommend resuming venom shots    - avoid stinging insects as much as possible    - have access to epipen in ca se of allergic reaction    - follow emergency action plan in case of reaction  Follow-up 3-4 months or sooner if needed

## 2017-05-12 ENCOUNTER — Other Ambulatory Visit (INDEPENDENT_AMBULATORY_CARE_PROVIDER_SITE_OTHER): Payer: Self-pay | Admitting: Internal Medicine

## 2017-05-15 ENCOUNTER — Telehealth: Payer: Self-pay | Admitting: Allergy & Immunology

## 2017-05-15 MED ORDER — BENZONATATE 200 MG PO CAPS: 200.0000 mg | ORAL_CAPSULE | Freq: Three times a day (TID) | ORAL | 0 refills | Status: AC | PRN

## 2017-05-15 MED ORDER — PREDNISONE 20 MG PO TABS: ORAL_TABLET | ORAL | 0 refills | Status: DC

## 2017-05-15 NOTE — Telephone Encounter (Signed)
I received a call from [name] reporting 2-3 days of persistent coughing and SOB despite the use of albuterol every 3-4 hours. She is also on Symbicort as well as Flovent. Therefore I sent in a five day course of prednisone as well as Tesalon pearls. Review of her chart shows that she was last seen in November 2018 and given a prednisone course at that time as well. She might be a good candidate for a biologic, although we did not discuss this during the phone call.  Salvatore Marvel, MD [facility]

## 2017-05-16 NOTE — Telephone Encounter (Signed)
[name] contacted me on 05/15/17 after our initial phone call, and she reported that the Tessalon pearls were too expensive. I called the pharmacy and they were around $17 with Medicaid. I asked for alternatives and we changed it to Cherry-Tussin instead.  Salvatore Marvel, MD [facility] [place]

## 2017-05-16 NOTE — Telephone Encounter (Signed)
Thanks.  I do not think she would be a good candidate for biologics given transportation issues.   Thanks.

## 2017-05-19 ENCOUNTER — Ambulatory Visit: Payer: Medicaid Other | Admitting: Allergy

## 2017-05-19 ENCOUNTER — Encounter: Payer: Self-pay | Admitting: Allergy

## 2017-05-19 VITALS — BP 130/88 | HR 111 | Ht 66.0 in | Wt 230.2 lb

## 2017-05-19 DIAGNOSIS — T6391XD Toxic effect of contact with unspecified venomous animal, accidental (unintentional), subsequent encounter: Secondary | ICD-10-CM

## 2017-05-19 DIAGNOSIS — L2084 Intrinsic (allergic) eczema: Secondary | ICD-10-CM | POA: Diagnosis not present

## 2017-05-19 DIAGNOSIS — J309 Allergic rhinitis, unspecified: Secondary | ICD-10-CM

## 2017-05-19 DIAGNOSIS — J4531 Mild persistent asthma with (acute) exacerbation: Secondary | ICD-10-CM

## 2017-05-19 DIAGNOSIS — H1010 Acute atopic conjunctivitis, unspecified eye: Secondary | ICD-10-CM | POA: Diagnosis not present

## 2017-05-19 MED ORDER — METHYLPREDNISOLONE ACETATE 80 MG/ML IJ SUSP
80.0000 mg | Freq: Once | INTRAMUSCULAR | Status: AC
  Administered 2017-05-19: 80 mg via INTRAMUSCULAR

## 2017-05-19 NOTE — Patient Instructions (Addendum)
Asthma with acute exacerbation    - keep up the good work with not smoking!!    - continue Symbicort 160 mcg take 2 puffs twice a day    - continue Flovent 110  Take 2 puffs twice a day with your Symbicort then stop once symptoms have improved     - depomedrol injection provided today    - continue albuterol inhaler 2 puffs or nebulizer 1 vial every 4-6 hours as needed for cough, wheeze, shortness of breath, chest tightness    - continue Singulair 10mg  take at bedtime    - return Monday morning a 9am for first Nucala injection and will continue Nucala injections once a month for better asthma control.  Will start approval process and you will be contacted by our biologics nurse, Tammy.       - want you to have access to an incentive spirometer device to help keep the lungs open especially given history of back surgeries.  Will provide with prescription.   Asthma control goals:   Full participation in all desired activities (may need albuterol before activity)  Albuterol use two time or less a week on average (not counting use with activity)  Cough interfering with sleep two time or less a month  Oral steroids no more than once a year  No hospitalizations   Eczema     - continue Eucrisa apply thin layer to affected area up to twice a day as needed.  May use all over body.  May use in addition with topical steroid creams.     - continue mometasone 0.1% apply thin layer to affected area up to twice a day.  Do not use on face, arm pits or genitalia    - moisturize daily with emollients like Vaseline, Aquafor, Eucerin  Allergies    - continue allergen avoidance measures for dust mites, dog, grasses, cockroach, mold, trees, weeds    - use Allegra 180mg  daily     - flonase 1-2 sprays as needed for nasal congestion    - use nasal saline spray into each nostril to help moisturize the nose    - Olopatadine 1 drop each eye as needed for itchy/red/watery eyes  Bee allergy    - recommend  resuming venom shots    - avoid stinging insects as much as possible    - have access to epipen in ca se of allergic reaction    - follow emergency action plan in case of reaction  Follow-up 3-4 months or sooner if needed

## 2017-05-19 NOTE — Progress Notes (Signed)
Follow-up Note  RE: Sharon Vazquez MRN: [id number] DOB: [birth year] Date of Office Visit: 05/19/2017   History of present illness: Sharon Vazquez is a 36 y.o. female presenting today for sick visit.  She was last seen in the office on 05/04/17 by myself at which time she was having asthma exacerbation and treated with 5 day course of prednisone and added Flovent to her Symbicort regimen.  She does feel like the addition of Flovent has helped.  However she returns today as she states she is still have shortness of breath and waking from sleep throughout the night and needing to use albuterol multiple times a day.  She also reports taking singulair daily.  She did call our after-hours on call doctor (Dr. Olin Hauser 12/10 with persistent symptoms which he extended her prednisone course as well as provided with cherrytusin.  She does not feel the cherrytusin helped at all with cough.   She denies any fever but continues to be very fatigued.  She had an MRI of back early today and took her xanax which she states has made her very tired.      Review of systems: Review of Systems  Constitutional: Positive for malaise/fatigue. Negative for chills, fever and weight loss.  HENT: Negative for congestion, ear discharge, ear pain, nosebleeds, sinus pain and sore throat.   Eyes: Negative for pain, discharge and redness.  Respiratory: Positive for cough, shortness of breath and wheezing. Negative for hemoptysis and sputum production.   Cardiovascular: Negative for chest pain.  Gastrointestinal: Negative for abdominal pain, constipation, diarrhea, nausea and vomiting.  Musculoskeletal: Positive for back pain.  Skin: Negative for itching and rash.  Neurological: Negative for dizziness and headaches.    All other systems negative unless noted above in HPI  Past medical/social/surgical/family history have been reviewed and are unchanged unless specifically indicated belo  No  changes  Medication List: Allergies as of 05/19/2017      Reactions   Bee Venom Anaphylaxis   Dilaudid [hydromorphone Hcl] Anaphylaxis, Hives   Has tolerated morphine since this reaction   Hydromorphone Anaphylaxis, Hives   Other Anaphylaxis   Bandaid, Nuts, Peanut Oil    Peanut Oil Anaphylaxis   Senna Anaphylaxis, Hives   Adhesive [tape] Hives, Other (See Comments)   Pulls skin off (use paper tape)   Latex Hives      Medication List        Accurate as of 05/19/17 12:27 PM. Always use your most recent med list.          albuterol (2.5 MG/3ML) 0.083% nebulizer solution Commonly known as:  PROVENTIL Take 3 mLs (2.5 mg total) by nebulization every 4 (four) hours as needed for wheezing or shortness of breath.   albuterol 108 (90 Base) MCG/ACT inhaler Commonly known as:  PROVENTIL HFA;VENTOLIN HFA Inhale 1-2 puffs into the lungs every 6 (six) hours as needed for wheezing.   ALIVE ONCE DAILY WOMENS PO Take 1 tablet by mouth daily.   ALPRAZolam 1 MG 24 hr tablet Commonly known as:  XANAX XR Take 1 mg by mouth 3 (three) times daily.   ALUVEA 39 % Crea Generic drug:  Urea Apply 1 application topically daily as needed (to heels).   benzonatate 200 MG capsule Commonly known as:  TESSALON Take 1 capsule (200 mg total) by mouth 3 (three) times daily as needed for up to 7 days for cough.   budesonide-formoterol 160-4.5 MCG/ACT inhaler Commonly known as:  SYMBICORT Inhale 2 puffs  into the lungs 2 (two) times daily.   cloNIDine 0.1 MG tablet Commonly known as:  CATAPRES Take 0.1 mg by mouth daily.   cyclobenzaprine 10 MG tablet Commonly known as:  FLEXERIL Take 10 mg 2 (two) times daily by mouth.   dexamethasone 4 MG tablet Commonly known as:  DECADRON Take 1 tablet (4 mg total) by mouth 2 (two) times daily with a meal.   diphenhydrAMINE 25 mg capsule Commonly known as:  BENADRYL Take 25 mg by mouth every 6 (six) hours as needed for itching or allergies.   doxepin  50 MG capsule Commonly known as:  SINEQUAN Take 1 capsule (50 mg total) by mouth at bedtime.   DULoxetine 60 MG capsule Commonly known as:  CYMBALTA Take 60 mg by mouth 2 (two) times daily.   esomeprazole 40 MG capsule Commonly known as:  NEXIUM TAKE 1 CAPSULE ONCE DAILY AT 12 NOON.   fexofenadine 180 MG tablet Commonly known as:  ALLEGRA Take 1 tablet (180 mg total) by mouth daily.   FLUoxetine 20 MG capsule Commonly known as:  PROZAC Take 20 mg by mouth daily.   fluticasone 110 MCG/ACT inhaler Commonly known as:  FLOVENT HFA Inhale 2 puffs into the lungs daily.   ketorolac 0.5 % ophthalmic solution Commonly known as:  ACULAR Place 1 drop into both eyes daily as needed (eye care).   lamoTRIgine 25 MG tablet Commonly known as:  LAMICTAL Take 1 tablet (25 mg total) by mouth daily.   medroxyPROGESTERone 150 MG/ML injection Commonly known as:  DEPO-PROVERA INJECT 1 VIAL INTRAMUSCULARLY EVERY 3 MONTHS IN OFFICE.   mometasone 0.1 % ointment Commonly known as:  ELOCON Apply 1 application topically 2 (two) times daily as needed (eczema).   montelukast 10 MG tablet Commonly known as:  SINGULAIR Take 1 tablet (10 mg total) by mouth at bedtime.   nystatin cream Commonly known as:  MYCOSTATIN Apply to affected area 2 times daily   Olopatadine HCl 0.2 % Soln Commonly known as:  PATADAY Place 1 drop into both eyes daily as needed (itchy eyes).   ondansetron 8 MG tablet Commonly known as:  ZOFRAN Take 8 mg by mouth every 8 (eight) hours as needed for nausea or vomiting.   oxybutynin 5 MG tablet Commonly known as:  DITROPAN Take 1 tablet (5 mg total) 3 (three) times daily by mouth.   predniSONE 20 MG tablet Commonly known as:  DELTASONE Take three tabs (60mg ) daily for five days.   propranolol 10 MG tablet Commonly known as:  INDERAL Take 10 mg by mouth 3 (three) times daily as needed (takes it twice daily and a 3rd dose if needed).   triazolam 0.25 MG  tablet Commonly known as:  HALCION Take 0.25 mg by mouth at bedtime.       Known medication allergies: Allergies  Allergen Reactions  . Bee Venom Anaphylaxis  . Dilaudid [Hydromorphone Hcl] Anaphylaxis and Hives    Has tolerated morphine since this reaction  . Hydromorphone Anaphylaxis and Hives  . Other Anaphylaxis    Bandaid, Nuts, Peanut Oil   . Peanut Oil Anaphylaxis  . Senna Anaphylaxis and Hives  . Adhesive [Tape] Hives and Other (See Comments)    Pulls skin off (use paper tape)  . Latex Hives     Physical examination: Blood pressure 130/88, pulse (!) 111, height 5\' 6"  (1.676 m), weight 230 lb 3.2 oz (104.4 kg), SpO2 97 %.  General: Alert, interactive, in no acute distress. HEENT: PERRLA,  TMs pearly gray, turbinates minimally edematous without discharge, post-pharynx non erythematous. Neck: Supple without lymphadenopathy. Lungs: Mildly decreased breath sounds bilaterally without wheezing, rhonchi or rales. {no increased work of breathing.  S/p albuterol 2-3 hours prior to exam CV: Normal S1, S2 without murmurs. Abdomen: Nondistended, nontender. Skin: Warm and dry, without lesions or rashes. Extremities:  No clubbing, cyanosis or edema. Neuro:   Grossly intact.  Diagnositics/Labs: Labs:  Component     Latest Ref Rng & Units 04/29/2017  WBC     4.0 - 10.5 K/uL 10.1  RBC     3.87 - 5.11 MIL/uL 3.96  Hemoglobin     12.0 - 15.0 g/dL 11.9 (L)  HCT     36.0 - 46.0 % 34.7 (L)  MCV     78.0 - 100.0 fL 87.6  MCH     26.0 - 34.0 pg 30.1  MCHC     30.0 - 36.0 g/dL 34.3  RDW     11.5 - 15.5 % 15.1  Platelets     150 - 400 K/uL 382  Neutrophils     % 58  NEUT#     1.7 - 7.7 K/uL 5.9  Lymphocytes     % 35  Lymphocyte #     0.7 - 4.0 K/uL 3.5  Monocytes Relative     % 5  Monocyte #     0.1 - 1.0 K/uL 0.5  Eosinophil     % 2  Eosinophils Absolute     0.0 - 0.7 K/uL 0.2  Basophil     % 0  Basophils Absolute     0.0 - 0.1 K/uL 0.0   Component      Latest Ref Rng & Units 04/29/2017  Sodium     135 - 145 mmol/L 135  Potassium     3.5 - 5.1 mmol/L 4.3  Chloride     101 - 111 mmol/L 101  CO2     22 - 32 mmol/L 25  Glucose     65 - 99 mg/dL 96  BUN     6 - 20 mg/dL 9  Creatinine     0.44 - 1.00 mg/dL 0.97  Calcium     8.9 - 10.3 mg/dL 9.0  GFR, Est Non African American     >60 mL/min >60  GFR, Est African American     >60 mL/min >60  Anion gap     5 - 15 9   Spirometry: FEV1: 3.24L  114%, FVC: 3.3L 96%, ratio consistent with nonobstructive pattern  ACT 12  Assessment and plan:   Asthma with acute exacerbation    - keep up the good work with not smoking!!    - continue Symbicort 160 mcg take 2 puffs twice a day    - continue Flovent 110  Take 2 puffs twice a day with your Symbicort then stop once symptoms have improved     - depomedrol injection provided today    - continue albuterol inhaler 2 puffs or nebulizer 1 vial every 4-6 hours as needed for cough, wheeze, shortness of breath, chest tightness    - continue Singulair 10mg  take at bedtime    - return Monday morning a 9am for first Nucala injection and will continue Nucala injections once a month for better asthma control.  Will start approval process and you will be contacted by our biologics nurse, [place].   I have discussed with her regarding previous transportation issues and she reports she would  be able to come to office once a month for injections.       - want you to have access to an incentive spirometer device to help keep the lungs open especially given history of back surgeries.  Will provided with medical equipment store locally that provides them for purchase and have asked that she purchase and use this device for better deeper breaths.   Asthma control goals:   Full participation in all desired activities (may need albuterol before activity)  Albuterol use two time or less a week on average (not counting use with activity)  Cough interfering with  sleep two time or less a month  Oral steroids no more than once a year  No hospitalizations   Eczema     - continue Eucrisa apply thin layer to affected area up to twice a day as needed.  May use all over body.  May use in addition with topical steroid creams.     - continue mometasone 0.1% apply thin layer to affected area up to twice a day.  Do not use on face, arm pits or genitalia    - moisturize daily with emollients like Vaseline, Aquafor, Eucerin  Allergies    - continue allergen avoidance measures for dust mites, dog, grasses, cockroach, mold, trees, weeds    - use Allegra 180mg  daily     - flonase 1-2 sprays as needed for nasal congestion    - use nasal saline spray into each nostril to help moisturize the nose    - Olopatadine 1 drop each eye as needed for itchy/red/watery eyes  Bee allergy    - recommend resuming venom shots    - avoid stinging insects as much as possible    - have access to epipen in case of allergic reaction    - follow emergency action plan in case of reaction  Follow-up 3-4 months or sooner if needed   I appreciate the opportunity to take part in Tandy's care. Please do not hesitate to contact me with questions.  Sincerely,   Prudy Feeler, MD Allergy/Immunology [facility]

## 2017-06-03 ENCOUNTER — Other Ambulatory Visit: Payer: Self-pay

## 2017-06-03 ENCOUNTER — Emergency Department (HOSPITAL_COMMUNITY)
Admission: EM | Admit: 2017-06-03 | Discharge: 2017-06-03 | Disposition: A | Discharge status: Home / Self Care | Payer: Medicaid Other | Attending: Emergency Medicine | Admitting: Emergency Medicine

## 2017-06-03 ENCOUNTER — Emergency Department (HOSPITAL_COMMUNITY): Payer: Medicaid Other

## 2017-06-03 ENCOUNTER — Encounter (HOSPITAL_COMMUNITY): Payer: Self-pay | Admitting: Emergency Medicine

## 2017-06-03 DIAGNOSIS — I10 Essential (primary) hypertension: Secondary | ICD-10-CM | POA: Diagnosis not present

## 2017-06-03 DIAGNOSIS — S060X0A Concussion without loss of consciousness, initial encounter: Secondary | ICD-10-CM | POA: Diagnosis not present

## 2017-06-03 DIAGNOSIS — S0990XA Unspecified injury of head, initial encounter: Secondary | ICD-10-CM | POA: Diagnosis present

## 2017-06-03 DIAGNOSIS — Z87891 Personal history of nicotine dependence: Secondary | ICD-10-CM | POA: Insufficient documentation

## 2017-06-03 DIAGNOSIS — R110 Nausea: Secondary | ICD-10-CM | POA: Diagnosis not present

## 2017-06-03 DIAGNOSIS — Y939 Activity, unspecified: Secondary | ICD-10-CM | POA: Insufficient documentation

## 2017-06-03 DIAGNOSIS — Z79899 Other long term (current) drug therapy: Secondary | ICD-10-CM | POA: Diagnosis not present

## 2017-06-03 DIAGNOSIS — Y929 Unspecified place or not applicable: Secondary | ICD-10-CM | POA: Insufficient documentation

## 2017-06-03 DIAGNOSIS — S39012A Strain of muscle, fascia and tendon of lower back, initial encounter: Secondary | ICD-10-CM

## 2017-06-03 DIAGNOSIS — Y998 Other external cause status: Secondary | ICD-10-CM | POA: Diagnosis not present

## 2017-06-03 DIAGNOSIS — Z91010 Allergy to peanuts: Secondary | ICD-10-CM | POA: Diagnosis not present

## 2017-06-03 DIAGNOSIS — W19XXXA Unspecified fall, initial encounter: Secondary | ICD-10-CM | POA: Insufficient documentation

## 2017-06-03 DIAGNOSIS — J45909 Unspecified asthma, uncomplicated: Secondary | ICD-10-CM | POA: Diagnosis not present

## 2017-06-03 DIAGNOSIS — S5002XA Contusion of left elbow, initial encounter: Secondary | ICD-10-CM | POA: Diagnosis not present

## 2017-06-03 DIAGNOSIS — Z91040 Latex allergy status: Secondary | ICD-10-CM | POA: Insufficient documentation

## 2017-06-03 LAB — URINALYSIS, ROUTINE W REFLEX MICROSCOPIC
BILIRUBIN URINE: NEGATIVE
Glucose, UA: NEGATIVE mg/dL
KETONES UR: NEGATIVE mg/dL
LEUKOCYTES UA: NEGATIVE
Nitrite: NEGATIVE
PH: 7 (ref 5.0–8.0)
PROTEIN: 100 mg/dL — AB
Specific Gravity, Urine: 1.003 — ABNORMAL LOW (ref 1.005–1.030)

## 2017-06-03 LAB — PREGNANCY, URINE: PREG TEST UR: NEGATIVE

## 2017-06-03 MED ORDER — ONDANSETRON HCL 4 MG/2ML IJ SOLN
4.0000 mg | Freq: Once | INTRAMUSCULAR | Status: AC
  Administered 2017-06-03: 4 mg via INTRAVENOUS
  Filled 2017-06-03: qty 2
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
## 2017-06-03 NOTE — ED Notes (Signed)
Evidently some urine was mislabeled with this pt's name,  pt had urine results prior to room placement in ED.  Pt stated that she never gave an urine sample.  Pt has urine resulted and is wrong due to wrong pt's name on urine. Pt was able to urinate and this nurse labeled and sent to lab for ua preg. Which was needed for xray to be done.  CN Laurell Josephs is investigating the incident.

## 2017-06-03 NOTE — ED Notes (Signed)
Lab aware specimen mislabeled and will credit pt.

## 2017-06-03 NOTE — ED Triage Notes (Signed)
Patient states she fell last night hitting her head on the bumper of her friends truck. She states she does not remember what happened. "I think I have a concussion."

## 2017-06-08 DIAGNOSIS — F419 Anxiety disorder, unspecified: Secondary | ICD-10-CM | POA: Insufficient documentation

## 2017-06-12 ENCOUNTER — Other Ambulatory Visit: Payer: Self-pay

## 2017-06-12 ENCOUNTER — Emergency Department (HOSPITAL_COMMUNITY)
Admission: EM | Admit: 2017-06-12 | Discharge: 2017-06-12 | Disposition: A | Discharge status: Home / Self Care | Payer: Medicaid Other | Attending: Emergency Medicine | Admitting: Emergency Medicine

## 2017-06-12 ENCOUNTER — Encounter (HOSPITAL_COMMUNITY): Payer: Self-pay | Admitting: *Deleted

## 2017-06-12 DIAGNOSIS — Z91010 Allergy to peanuts: Secondary | ICD-10-CM | POA: Insufficient documentation

## 2017-06-12 DIAGNOSIS — I10 Essential (primary) hypertension: Secondary | ICD-10-CM | POA: Insufficient documentation

## 2017-06-12 DIAGNOSIS — R51 Headache: Secondary | ICD-10-CM | POA: Insufficient documentation

## 2017-06-12 DIAGNOSIS — Z87891 Personal history of nicotine dependence: Secondary | ICD-10-CM | POA: Diagnosis not present

## 2017-06-12 DIAGNOSIS — J45909 Unspecified asthma, uncomplicated: Secondary | ICD-10-CM | POA: Diagnosis not present

## 2017-06-12 DIAGNOSIS — Z79899 Other long term (current) drug therapy: Secondary | ICD-10-CM | POA: Diagnosis not present

## 2017-06-12 DIAGNOSIS — H81399 Other peripheral vertigo, unspecified ear: Secondary | ICD-10-CM | POA: Diagnosis not present

## 2017-06-12 DIAGNOSIS — R42 Dizziness and giddiness: Secondary | ICD-10-CM | POA: Diagnosis present

## 2017-06-12 DIAGNOSIS — Z91040 Latex allergy status: Secondary | ICD-10-CM | POA: Insufficient documentation

## 2017-06-12 DIAGNOSIS — R519 Headache, unspecified: Secondary | ICD-10-CM

## 2017-06-12 MED ORDER — MECLIZINE HCL 25 MG PO TABS: 25.0000 mg | ORAL_TABLET | Freq: Three times a day (TID) | ORAL | 0 refills | Status: DC | PRN

## 2017-06-12 MED ORDER — METOCLOPRAMIDE HCL 5 MG/ML IJ SOLN
10.0000 mg | Freq: Once | INTRAMUSCULAR | Status: AC
  Administered 2017-06-12: 10 mg via INTRAVENOUS
  Filled 2017-06-12: qty 2

## 2017-06-12 MED ORDER — KETOROLAC TROMETHAMINE 30 MG/ML IJ SOLN
30.0000 mg | Freq: Once | INTRAMUSCULAR | Status: AC
  Administered 2017-06-12: 30 mg via INTRAVENOUS
  Filled 2017-06-12: qty 1

## 2017-06-12 MED ORDER — SODIUM CHLORIDE 0.9 % IV BOLUS (SEPSIS)
1000.0000 mL | Freq: Once | INTRAVENOUS | Status: AC
  Administered 2017-06-12: 1000 mL via INTRAVENOUS

## 2017-06-12 MED ORDER — MECLIZINE HCL 12.5 MG PO TABS
25.0000 mg | ORAL_TABLET | Freq: Once | ORAL | Status: AC
  Administered 2017-06-12: 25 mg via ORAL
  Filled 2017-06-12: qty 2

## 2017-06-12 MED ORDER — DIPHENHYDRAMINE HCL 50 MG/ML IJ SOLN
25.0000 mg | Freq: Once | INTRAMUSCULAR | Status: AC
  Administered 2017-06-12: 25 mg via INTRAVENOUS
  Filled 2017-06-12: qty 1
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
## 2017-06-12 NOTE — ED Notes (Signed)
Pt is now asleep. Pt states she has already taken her night time sleeping medication. Pt reports when she tries to get out of the and when she wakes up the room is spinning.

## 2017-06-12 NOTE — ED Triage Notes (Signed)
Pt reports she was seen here on Dec. 29th after falling and hitting her the left side of her head on the bumper of a truck. Pt states seh was diagnosed with a concussion and has continued to be dizzy since the fall. Pt states she feels like the room is spinning and is nauseated.

## 2017-06-13 ENCOUNTER — Ambulatory Visit (INDEPENDENT_AMBULATORY_CARE_PROVIDER_SITE_OTHER): Payer: Medicaid Other | Admitting: Internal Medicine

## 2017-06-13 ENCOUNTER — Encounter (INDEPENDENT_AMBULATORY_CARE_PROVIDER_SITE_OTHER): Payer: Self-pay | Admitting: Internal Medicine

## 2017-06-13 VITALS — BP 144/110 | HR 120 | Temp 98.8°F | Resp 18 | Ht 67.0 in | Wt 239.2 lb

## 2017-06-13 DIAGNOSIS — K219 Gastro-esophageal reflux disease without esophagitis: Secondary | ICD-10-CM

## 2017-06-13 DIAGNOSIS — K5909 Other constipation: Secondary | ICD-10-CM | POA: Diagnosis not present

## 2017-06-13 NOTE — Progress Notes (Signed)
Presenting complaint;  Follow-up for chronic constipation and GERD.  Subjective:  Patient is a 37 year old Afro-American female who is here for scheduled visit.  She has chronic constipation.  She also has GERD and history of colonic adenomas.  She was last seen on 04/18/2017 by Ms. Setzer, NP for rectal bleeding felt to be due to hemorrhoids.  She was sent to Dr. Leighton Ruff of [facility] surgery.  She did not require any therapy.  She recalls that she did get infected and was seen at [facility] clinic and treated with GoLYTELY.  She states her bowels are moving much better since she is come off narcotics.  However she is having more back pain.  She states she is having 2-3 soft to formed stools daily.  She may notice a scant amount of blood on wiping once or twice a week.  She has not had any frank bleeding.  She states 3 weeks ago she had black stool or  Couple days but not anymore.  She states she only took 1 dose of Pepto-Bismol.  She states few days ago she fell and was seen in emergency room a day later for concussion.  She was felt to have vertigo.  She says since then she has been having short-term memory problems and is scheduled to be seen by a neurologist.  She says she does not take diclofenac on daily basis.  She quit cigarette smoking in October 2017.  She says she also quit eating pork 1 year ago. She is having difficulty ambulating.  She is using cane to move around. She feels heartburn is well controlled with therapy.  She denies hoarseness sore throat or chronic cough. She was also treated for urinary retention and is scheduled to follow-up with urologist. She would like to get samples of Trulance just in case.  She states she cannot afford this medication.  She states she has had good results with this medication.  Current Medications: Outpatient Encounter Medications as of 06/13/2017  Medication Sig  . albuterol (PROVENTIL HFA;VENTOLIN HFA) 108 (90 Base) MCG/ACT inhaler Inhale  1-2 puffs into the lungs every 6 (six) hours as needed for wheezing.  Marland Kitchen albuterol (PROVENTIL) (2.5 MG/3ML) 0.083% nebulizer solution Take 3 mLs (2.5 mg total) by nebulization every 4 (four) hours as needed for wheezing or shortness of breath.  . ALPRAZolam (XANAX XR) 2 MG 24 hr tablet Take 2 mg by mouth 2 (two) times daily.   . budesonide-formoterol (SYMBICORT) 160-4.5 MCG/ACT inhaler Inhale 2 puffs into the lungs 2 (two) times daily.  . cloNIDine (CATAPRES) 0.1 MG tablet Take 0.1 mg by mouth daily.   . cyclobenzaprine (FLEXERIL) 10 MG tablet Take 10 mg 2 (two) times daily by mouth.  . diclofenac (VOLTAREN) 75 MG EC tablet Take 75 mg by mouth 2 (two) times daily. Take 1 tablet by mouth two times daily after meals for inflammation, pain, and swelling.  Marland Kitchen EPINEPHrine (EPIPEN JR) 0.15 MG/0.3ML injection Inject 0.3 mg into the muscle as needed for anaphylaxis.  Marland Kitchen esomeprazole (NEXIUM) 40 MG capsule TAKE 1 CAPSULE ONCE DAILY AT 12 NOON.  . fexofenadine (ALLEGRA) 180 MG tablet Take 1 tablet (180 mg total) by mouth daily.  . fluticasone (FLOVENT HFA) 110 MCG/ACT inhaler Inhale 2 puffs into the lungs daily.  Marland Kitchen ketorolac (ACULAR) 0.5 % ophthalmic solution Place 1 drop into both eyes daily as needed (eye care).   Marland Kitchen lamoTRIgine (LAMICTAL) 25 MG tablet Take 1 tablet (25 mg total) by mouth daily. (Patient  taking differently: Take 150 mg by mouth daily. )  . meclizine (ANTIVERT) 25 MG tablet Take 1 tablet (25 mg total) by mouth 3 (three) times daily as needed for dizziness.  . medroxyPROGESTERone (DEPO-PROVERA) 150 MG/ML injection INJECT 1 VIAL INTRAMUSCULARLY EVERY 3 MONTHS IN OFFICE.  . mirabegron ER (MYRBETRIQ) 50 MG TB24 tablet Take 50 mg by mouth daily.  . mometasone (ELOCON) 0.1 % ointment Apply 1 application topically 2 (two) times daily as needed (eczema).  . montelukast (SINGULAIR) 10 MG tablet Take 1 tablet (10 mg total) by mouth at bedtime.  Marland Kitchen nystatin cream (MYCOSTATIN) Apply to affected area 2 times  daily  . Olopatadine HCl (PATADAY) 0.2 % SOLN Place 1 drop into both eyes daily as needed (itchy eyes).  . ondansetron (ZOFRAN) 8 MG tablet Take 8 mg by mouth every 8 (eight) hours as needed for nausea or vomiting.   . propranolol (INDERAL) 10 MG tablet Take 10 mg by mouth 3 (three) times daily as needed (takes it twice daily and a 3rd dose if needed).   . triazolam (HALCION) 0.25 MG tablet Take 0.25 mg by mouth at bedtime.   . Urea (ALUVEA) 39 % CREA Apply 1 application topically daily as needed (to heels).   . [DISCONTINUED] dexamethasone (DECADRON) 4 MG tablet Take 1 tablet (4 mg total) by mouth 2 (two) times daily with a meal. (Patient not taking: Reported on 05/19/2017)  . [DISCONTINUED] diphenhydrAMINE (BENADRYL) 25 mg capsule Take 25 mg by mouth every 6 (six) hours as needed for itching or allergies.   . [DISCONTINUED] doxepin (SINEQUAN) 50 MG capsule Take 1 capsule (50 mg total) by mouth at bedtime. (Patient not taking: Reported on 05/19/2017)  . [DISCONTINUED] DULoxetine (CYMBALTA) 60 MG capsule Take 60 mg by mouth 2 (two) times daily.  . [DISCONTINUED] FLUoxetine (PROZAC) 20 MG capsule Take 20 mg by mouth daily.  . [DISCONTINUED] Multiple Vitamins-Minerals (ALIVE ONCE DAILY WOMENS PO) Take 1 tablet by mouth daily.   . [DISCONTINUED] oxybutynin (DITROPAN) 5 MG tablet Take 1 tablet (5 mg total) 3 (three) times daily by mouth. (Patient not taking: Reported on 06/13/2017)  . [DISCONTINUED] predniSONE (DELTASONE) 20 MG tablet Take three tabs (60mg ) daily for five days. (Patient not taking: Reported on 06/03/2017)   No facility-administered encounter medications on file as of 06/13/2017.      Objective: Blood pressure (!) 144/110, pulse (!) 120, temperature 98.8 F (37.1 C), temperature source Oral, resp. rate 18, height 5\' 7"  (1.702 m), weight 239 lb 3.2 oz (108.5 kg). Patient is alert and in no acute distress. Conjunctiva is pink. Sclera is nonicteric Oropharyngeal mucosa is normal. No  neck masses or thyromegaly noted. Cardiac exam with regular rhythm normal S1 and S2. No murmur or gallop noted. Lungs are clear to auscultation. Abdomen is full with ring at umbilicus.  On palpation abdomen is soft and nontender without organomegaly or masses. No LE edema or clubbing noted.   Assessment:  #1.  Chronic constipation.  She is doing much better since she is come off some of her medications.  She has had satisfactory results with Trulance that she can go back on on as-needed basis.  Will try to obtain samples for the patient.  #2.  Chronic GERD.  She is doing much better with dietary measures and single PPI dose.  If she continues to do well would consider dropping dose in future.  #3.  History of colonic polyps.  First colonoscopy was in March 2014 with removal of  sessile serrated adenoma with dysplasia.  Last colonoscopy was in April 2017 with removal of small tubular adenoma.  Next colonoscopy would be in April 2022.   Plan:  Hemoccult x1 if stools are black. Will contact patient when Trulance samples available. Office visit in 6 months.

## 2017-06-13 NOTE — Patient Instructions (Addendum)
Call if constipation relapses. Hemoccult x1 if stool black.

## 2017-06-14 ENCOUNTER — Telehealth: Payer: Self-pay | Admitting: Allergy

## 2017-06-14 ENCOUNTER — Other Ambulatory Visit: Payer: Self-pay

## 2017-06-14 MED ORDER — MONTELUKAST SODIUM 10 MG PO TABS: 10.0000 mg | ORAL_TABLET | Freq: Every day | ORAL | 5 refills | Status: DC

## 2017-06-14 NOTE — Telephone Encounter (Signed)
RX sent into Pt's pharmacy, Pt advised.

## 2017-06-14 NOTE — Telephone Encounter (Signed)
Patient has been taking Singulair and her pharmacy;[facility] in [place], [phone], sent a request and they haven't heard back. Patient would like a call back to tell her if she is still suppose to be taking this. She was put on a Nebulizer.

## 2017-06-16 ENCOUNTER — Ambulatory Visit: Payer: Medicaid Other | Admitting: Allergy

## 2017-06-19 ENCOUNTER — Encounter: Payer: Self-pay | Admitting: *Deleted

## 2017-06-19 ENCOUNTER — Ambulatory Visit (INDEPENDENT_AMBULATORY_CARE_PROVIDER_SITE_OTHER): Payer: Medicaid Other | Admitting: *Deleted

## 2017-06-19 VITALS — Wt 240.0 lb

## 2017-06-19 DIAGNOSIS — Z3202 Encounter for pregnancy test, result negative: Secondary | ICD-10-CM

## 2017-06-19 DIAGNOSIS — Z3042 Encounter for surveillance of injectable contraceptive: Secondary | ICD-10-CM

## 2017-06-19 LAB — POCT URINE PREGNANCY: PREG TEST UR: NEGATIVE

## 2017-06-19 MED ORDER — MEDROXYPROGESTERONE ACETATE 150 MG/ML IM SUSP
150.0000 mg | Freq: Once | INTRAMUSCULAR | Status: AC
  Administered 2017-06-19: 150 mg via INTRAMUSCULAR

## 2017-06-19 NOTE — Progress Notes (Signed)
Depo Provera 150mg IM given in left deltoid with no complications. Pt to return in 12 weeks for next injection.  

## 2017-06-21 ENCOUNTER — Ambulatory Visit: Payer: Medicaid Other

## 2017-06-23 ENCOUNTER — Other Ambulatory Visit: Payer: Self-pay

## 2017-06-23 ENCOUNTER — Telehealth: Payer: Self-pay

## 2017-06-23 MED ORDER — TIOTROPIUM BROMIDE MONOHYDRATE 1.25 MCG/ACT IN AERS: 2.0000 | INHALATION_SPRAY | Freq: Every day | RESPIRATORY_TRACT | 2 refills | Status: DC

## 2017-06-23 NOTE — Telephone Encounter (Signed)
Patient called stating that she has a cough and it will not go away. She said she was told to take mucinex but she stated it was not helping and she was coughing stuff up. I informed her that mucinex is designed to brake up the mucus in her chest and cough it up. She stated that she has had this cough for a while and would like some relief. Please advise.

## 2017-06-23 NOTE — Telephone Encounter (Signed)
Called and spoke with patient and informed her of Dr. Jeralyn Ruths recommendation. Spiriva has been sent in. Patient stated that she does want to start Nucala injection and has an appointment for Friday 06/30/2017 at 11:30 for a sample.

## 2017-06-23 NOTE — Telephone Encounter (Signed)
It appears that her asthma is still not well controlled given continued symptoms.   I had recommended at last visit that she start Nucala for better asthma control and offered a sample injection however she did not return to have this started.    This is still a therapy we can offer her.    She should continue her routine medications of Singulair and Flovent.  To step -up therapy will have her add Spiriva 2 inhalations daily to her regimen.

## 2017-06-26 ENCOUNTER — Telehealth: Payer: Self-pay | Admitting: Allergy

## 2017-06-26 NOTE — Telephone Encounter (Signed)
Pt waiting on a prior auth for spiriva. [id number].

## 2017-06-26 NOTE — Telephone Encounter (Signed)
Great thanks so much 

## 2017-06-26 NOTE — Telephone Encounter (Signed)
Can we appeal this?  The handihaler only has one dose which is for COPD.  The respimat 1.25mg  dosing is for asthma control.

## 2017-06-26 NOTE — Telephone Encounter (Signed)
Medicaid will not cover the respimat, it has to be the handihaler version. Please advise and thank you.

## 2017-06-26 NOTE — Telephone Encounter (Signed)
Patient is aware that the authorization has been approved and faxed to the pharmacy.

## 2017-06-26 NOTE — Telephone Encounter (Signed)
Spiriva Respimat 1.25 was approved. I will fax over the approval to the pharmacy.

## 2017-06-27 ENCOUNTER — Other Ambulatory Visit: Payer: Self-pay | Admitting: *Deleted

## 2017-06-29 ENCOUNTER — Ambulatory Visit (INDEPENDENT_AMBULATORY_CARE_PROVIDER_SITE_OTHER): Payer: Medicaid Other | Admitting: *Deleted

## 2017-06-29 DIAGNOSIS — J4550 Severe persistent asthma, uncomplicated: Secondary | ICD-10-CM | POA: Diagnosis not present

## 2017-06-30 ENCOUNTER — Ambulatory Visit: Payer: Self-pay

## 2017-06-30 MED ORDER — MEPOLIZUMAB 100 MG ~~LOC~~ SOLR
100.0000 mg | SUBCUTANEOUS | Status: DC
  Administered 2017-06-29 – 2018-03-13 (×10): 100 mg via SUBCUTANEOUS

## 2017-07-05 ENCOUNTER — Encounter (HOSPITAL_COMMUNITY): Payer: Self-pay

## 2017-07-05 ENCOUNTER — Emergency Department (HOSPITAL_COMMUNITY)
Admission: EM | Admit: 2017-07-05 | Discharge: 2017-07-06 | Disposition: A | Discharge status: Home / Self Care | Payer: Medicaid Other | Attending: Emergency Medicine | Admitting: Emergency Medicine

## 2017-07-05 DIAGNOSIS — Z87891 Personal history of nicotine dependence: Secondary | ICD-10-CM | POA: Diagnosis not present

## 2017-07-05 DIAGNOSIS — Z79899 Other long term (current) drug therapy: Secondary | ICD-10-CM | POA: Diagnosis not present

## 2017-07-05 DIAGNOSIS — J45909 Unspecified asthma, uncomplicated: Secondary | ICD-10-CM | POA: Insufficient documentation

## 2017-07-05 DIAGNOSIS — G8929 Other chronic pain: Secondary | ICD-10-CM

## 2017-07-05 DIAGNOSIS — M5440 Lumbago with sciatica, unspecified side: Secondary | ICD-10-CM | POA: Diagnosis not present

## 2017-07-05 DIAGNOSIS — R42 Dizziness and giddiness: Secondary | ICD-10-CM | POA: Insufficient documentation

## 2017-07-05 DIAGNOSIS — M545 Low back pain: Secondary | ICD-10-CM | POA: Diagnosis present

## 2017-07-05 DIAGNOSIS — R51 Headache: Secondary | ICD-10-CM | POA: Diagnosis not present

## 2017-07-05 DIAGNOSIS — Z91040 Latex allergy status: Secondary | ICD-10-CM | POA: Insufficient documentation

## 2017-07-05 DIAGNOSIS — Z91010 Allergy to peanuts: Secondary | ICD-10-CM | POA: Diagnosis not present

## 2017-07-05 DIAGNOSIS — I10 Essential (primary) hypertension: Secondary | ICD-10-CM | POA: Insufficient documentation

## 2017-07-05 NOTE — ED Triage Notes (Signed)
Pt c/o lower back pain, states she fell on Monday and went to [place], did not have x-rays.  Pt states pain has worsened today.   Pt states she originally called ems for her blood pressure being elevated.  Pt is slurring her speech some and when asked about this, states she is tired because she has insomnia and doesn't rest well.

## 2017-07-06 MED ORDER — CYCLOBENZAPRINE HCL 10 MG PO TABS
10.0000 mg | ORAL_TABLET | Freq: Once | ORAL | Status: AC
  Administered 2017-07-06: 10 mg via ORAL
  Filled 2017-07-06: qty 1

## 2017-07-06 MED ORDER — IBUPROFEN 800 MG PO TABS
800.0000 mg | ORAL_TABLET | Freq: Once | ORAL | Status: DC
  Filled 2017-07-06: qty 1

## 2017-07-06 MED ORDER — MECLIZINE HCL 12.5 MG PO TABS
25.0000 mg | ORAL_TABLET | Freq: Once | ORAL | Status: AC
  Administered 2017-07-06: 25 mg via ORAL
  Filled 2017-07-06: qty 2

## 2017-07-06 NOTE — ED Notes (Signed)
Pt walked out of treatment area and when questioned if she was going to wait for her discharge papers, pt made a hand gesture waving me away.  Pt left the department with her Mother.
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
## 2017-07-10 ENCOUNTER — Encounter: Payer: Self-pay | Admitting: *Deleted

## 2017-07-11 ENCOUNTER — Encounter: Payer: Self-pay | Admitting: Diagnostic Neuroimaging

## 2017-07-11 ENCOUNTER — Ambulatory Visit: Payer: Medicaid Other | Admitting: Diagnostic Neuroimaging

## 2017-07-11 VITALS — BP 143/88 | HR 102 | Ht 66.0 in | Wt 245.0 lb

## 2017-07-11 DIAGNOSIS — F0781 Postconcussional syndrome: Secondary | ICD-10-CM
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
## 2017-07-11 NOTE — Patient Instructions (Addendum)
-   try to improve nutrition, exercise, stress management  - referral to physical therapy  - follow up with PCP and psychiatry

## 2017-07-19 ENCOUNTER — Other Ambulatory Visit: Payer: Self-pay

## 2017-07-19 ENCOUNTER — Encounter (HOSPITAL_COMMUNITY): Payer: Self-pay

## 2017-07-19 DIAGNOSIS — Z79899 Other long term (current) drug therapy: Secondary | ICD-10-CM | POA: Diagnosis not present

## 2017-07-19 DIAGNOSIS — Z87891 Personal history of nicotine dependence: Secondary | ICD-10-CM | POA: Insufficient documentation

## 2017-07-19 DIAGNOSIS — J45909 Unspecified asthma, uncomplicated: Secondary | ICD-10-CM | POA: Diagnosis not present

## 2017-07-19 DIAGNOSIS — Z91010 Allergy to peanuts: Secondary | ICD-10-CM | POA: Insufficient documentation

## 2017-07-19 DIAGNOSIS — R1032 Left lower quadrant pain: Secondary | ICD-10-CM | POA: Insufficient documentation

## 2017-07-19 DIAGNOSIS — I10 Essential (primary) hypertension: Secondary | ICD-10-CM | POA: Insufficient documentation

## 2017-07-19 DIAGNOSIS — Z91040 Latex allergy status: Secondary | ICD-10-CM | POA: Diagnosis not present

## 2017-07-19 DIAGNOSIS — R319 Hematuria, unspecified: Secondary | ICD-10-CM | POA: Diagnosis present

## 2017-07-19 NOTE — ED Triage Notes (Signed)
Pt reports 1 hour of hematuria with left lower abd and left flank pain.

## 2017-07-20 ENCOUNTER — Ambulatory Visit (HOSPITAL_COMMUNITY): Payer: Medicaid Other | Admitting: Physical Therapy

## 2017-07-20 ENCOUNTER — Emergency Department (HOSPITAL_COMMUNITY)
Admission: EM | Admit: 2017-07-20 | Discharge: 2017-07-20 | Disposition: A | Discharge status: Home / Self Care | Payer: Medicaid Other | Attending: Emergency Medicine | Admitting: Emergency Medicine

## 2017-07-20 DIAGNOSIS — R1032 Left lower quadrant pain: Secondary | ICD-10-CM

## 2017-07-20 DIAGNOSIS — R310 Gross hematuria: Secondary | ICD-10-CM

## 2017-07-20 LAB — URINALYSIS, ROUTINE W REFLEX MICROSCOPIC
Bilirubin Urine: NEGATIVE
Glucose, UA: NEGATIVE mg/dL
HGB URINE DIPSTICK: NEGATIVE
Ketones, ur: NEGATIVE mg/dL
Leukocytes, UA: NEGATIVE
Nitrite: NEGATIVE
PROTEIN: NEGATIVE mg/dL
SPECIFIC GRAVITY, URINE: 1.005 (ref 1.005–1.030)
pH: 6 (ref 5.0–8.0)

## 2017-07-20 LAB — CBC WITH DIFFERENTIAL/PLATELET
Basophils Absolute: 0 10*3/uL (ref 0.0–0.1)
Basophils Relative: 0 %
EOS ABS: 0.1 10*3/uL (ref 0.0–0.7)
EOS PCT: 1 %
HCT: 37.3 % (ref 36.0–46.0)
Hemoglobin: 12.7 g/dL (ref 12.0–15.0)
LYMPHS ABS: 3.3 10*3/uL (ref 0.7–4.0)
LYMPHS PCT: 27 %
MCH: 30.5 pg (ref 26.0–34.0)
MCHC: 34 g/dL (ref 30.0–36.0)
MCV: 89.7 fL (ref 78.0–100.0)
MONO ABS: 0.5 10*3/uL (ref 0.1–1.0)
MONOS PCT: 4 %
Neutro Abs: 8.1 10*3/uL — ABNORMAL HIGH (ref 1.7–7.7)
Neutrophils Relative %: 68 %
PLATELETS: 424 10*3/uL — AB (ref 150–400)
RBC: 4.16 MIL/uL (ref 3.87–5.11)
RDW: 13.8 % (ref 11.5–15.5)
WBC: 12.1 10*3/uL — ABNORMAL HIGH (ref 4.0–10.5)

## 2017-07-20 LAB — COMPREHENSIVE METABOLIC PANEL
ALK PHOS: 53 U/L (ref 38–126)
ALT: 21 U/L (ref 14–54)
AST: 20 U/L (ref 15–41)
Albumin: 3.9 g/dL (ref 3.5–5.0)
Anion gap: 11 (ref 5–15)
BUN: 9 mg/dL (ref 6–20)
CALCIUM: 9.5 mg/dL (ref 8.9–10.3)
CHLORIDE: 104 mmol/L (ref 101–111)
CO2: 23 mmol/L (ref 22–32)
CREATININE: 0.85 mg/dL (ref 0.44–1.00)
GFR calc Af Amer: >60 mL/min (ref >60)
GFR calc non Af Amer: >60 mL/min (ref >60)
GLUCOSE: 95 mg/dL (ref 65–99)
Potassium: 4 mmol/L (ref 3.5–5.1)
SODIUM: 138 mmol/L (ref 135–145)
Total Bilirubin: 0.4 mg/dL (ref 0.3–1.2)
Total Protein: 7.4 g/dL (ref 6.5–8.1)

## 2017-07-20 LAB — PREGNANCY, URINE: Preg Test, Ur: NEGATIVE

## 2017-07-20 LAB — LIPASE, BLOOD: Lipase: 30 U/L (ref 11–51)

## 2017-07-20 MED ORDER — ONDANSETRON HCL 4 MG/2ML IJ SOLN
4.0000 mg | Freq: Once | INTRAMUSCULAR | Status: AC
  Administered 2017-07-20: 4 mg via INTRAVENOUS
  Filled 2017-07-20: qty 2

## 2017-07-20 MED ORDER — OXYCODONE-ACETAMINOPHEN 5-325 MG PO TABS: 1.0000 | ORAL_TABLET | ORAL | 0 refills | Status: DC | PRN

## 2017-07-20 MED ORDER — MORPHINE SULFATE (PF) 4 MG/ML IV SOLN
4.0000 mg | Freq: Once | INTRAVENOUS | Status: AC
  Administered 2017-07-20: 4 mg via INTRAVENOUS
  Filled 2017-07-20: qty 1

## 2017-07-20 MED ORDER — SODIUM CHLORIDE 0.9 % IV BOLUS (SEPSIS)
1000.0000 mL | Freq: Once | INTRAVENOUS | Status: AC
  Administered 2017-07-20: 1000 mL via INTRAVENOUS

## 2017-07-20 NOTE — Discharge Instructions (Signed)
The cause for your pain and the blood in the urine or not clear after evaluation in the emergency department.  Your urine actually did not have any blood in it or any signs of infection.  Please follow-up with your urologist.  Return to the emergency department if symptoms worsen.
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
## 2017-07-20 NOTE — ED Notes (Signed)
Patient was given a prepackage of Percocet quantity and given instructions on use, patient verbally understands.

## 2017-07-21 MED FILL — Oxycodone w/ Acetaminophen Tab 5-325 MG: ORAL | Qty: 6 | Status: AC

## 2017-07-27 ENCOUNTER — Other Ambulatory Visit: Payer: Self-pay

## 2017-07-27 DIAGNOSIS — H1010 Acute atopic conjunctivitis, unspecified eye: Secondary | ICD-10-CM

## 2017-07-27 DIAGNOSIS — J309 Allergic rhinitis, unspecified: Principal | ICD-10-CM

## 2017-07-27 MED ORDER — FEXOFENADINE HCL 180 MG PO TABS: 180.0000 mg | ORAL_TABLET | Freq: Every day | ORAL | 5 refills | Status: DC

## 2017-08-01 ENCOUNTER — Ambulatory Visit (INDEPENDENT_AMBULATORY_CARE_PROVIDER_SITE_OTHER): Payer: Medicaid Other

## 2017-08-01 DIAGNOSIS — J4550 Severe persistent asthma, uncomplicated: Secondary | ICD-10-CM

## 2017-08-07 ENCOUNTER — Encounter (HOSPITAL_COMMUNITY): Payer: Self-pay | Admitting: Physical Therapy

## 2017-08-07 ENCOUNTER — Ambulatory Visit (HOSPITAL_COMMUNITY): Payer: Medicaid Other | Attending: Diagnostic Neuroimaging | Admitting: Physical Therapy

## 2017-08-07 ENCOUNTER — Other Ambulatory Visit: Payer: Self-pay

## 2017-08-07 DIAGNOSIS — M6281 Muscle weakness (generalized): Secondary | ICD-10-CM

## 2017-08-07 DIAGNOSIS — R296 Repeated falls: Secondary | ICD-10-CM | POA: Insufficient documentation

## 2017-08-07 DIAGNOSIS — R262 Difficulty in walking, not elsewhere classified: Secondary | ICD-10-CM | POA: Diagnosis not present
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
## 2017-08-07 NOTE — Patient Instructions (Addendum)
Flexibility: Neck Retraction    Pull head straight back, keeping eyes and jaw level. Repeat __10__ times per set. Do _1___ sets per session. Do _2___ sessions per day.  http://orth.exer.us/344   Copyright  VHI. All rights reserved.  Scapular Retraction (Standing)    With arms at sides, pinch shoulder blades together. Repeat ___10_ times per set. Do ___1_ sets per session. Do ___2_ sessions per day.  http://orth.exer.us/944   Copyright  VHI. All rights reserved.  Gaze Stabilization: Sitting    Keeping eyes on target on wall 3 feet away, tilt head down 15-30 and move head side to side for _15-30___. Repeat while moving head up and down for 15-30 .  Stop if symptoms increase.  Do _2___ sessions per day. Repeat using target on pattern background.  Copyright  VHI. All rights reserved.  Heel Raise: Bilateral (Standing)   At your kitchen counter.  Rise on balls of feet. Repeat _10-15___ times per set. Do ___1_ sets per session. Do 2____ sessions per day.  http://orth.exer.us/38   Copyright  VHI. All rights reserved.  Balance: Unilateral   At the kitchen counter  Attempt to balance on left leg, eyes open. Hold __30-60__ seconds. Repeat _3___ times per set. Do ___1_ sets per session. Do _2___ sessions per day. Perform exercise with eyes closed.  http://orth.exer.us/28   Copyright  VHI. All rights reserved.

## 2017-08-08 DIAGNOSIS — F0781 Postconcussional syndrome: Secondary | ICD-10-CM | POA: Insufficient documentation

## 2017-08-08 DIAGNOSIS — Z79899 Other long term (current) drug therapy: Secondary | ICD-10-CM | POA: Insufficient documentation

## 2017-08-08 DIAGNOSIS — R319 Hematuria, unspecified: Secondary | ICD-10-CM | POA: Insufficient documentation

## 2017-08-08 DIAGNOSIS — R7303 Prediabetes: Secondary | ICD-10-CM | POA: Insufficient documentation

## 2017-08-15 ENCOUNTER — Telehealth (HOSPITAL_COMMUNITY): Payer: Self-pay | Admitting: Physical Therapy

## 2017-08-15 NOTE — Telephone Encounter (Signed)
Patient states she know Medicaid will not cover for more visit and request to be d/c -pt unstands she was approved for 3 visit and then we would ask for more before she came back for the 4th visit

## 2017-08-15 NOTE — Telephone Encounter (Signed)
PHYSICAL THERAPY DISCHARGE SUMMARY  Visits from Start of Care: 1   PT called after the evaluation stating that she knew medicaid would not cover the treatments and she wanted to cancel all remaining appointments.  Scheduler tried to explain to the patient that we have not heard back and she most likely would be approved but the  Current functional level related to goals / functional outcomes: See evaluation.    Remaining deficits: See evaluation    Education / Equipment: HEP Plan: Patient agrees to discharge.  Patient goals were not met. Patient is being discharged due to not returning since the last visit.  ?????    Rayetta Humphrey, [place] CLT [phone]

## 2017-08-16 ENCOUNTER — Encounter (HOSPITAL_COMMUNITY): Payer: Self-pay | Admitting: Physical Therapy

## 2017-08-18 ENCOUNTER — Encounter (HOSPITAL_COMMUNITY): Payer: Self-pay

## 2017-08-21 ENCOUNTER — Telehealth: Payer: Self-pay | Admitting: *Deleted

## 2017-08-21 ENCOUNTER — Encounter (HOSPITAL_COMMUNITY): Payer: Self-pay | Admitting: Physical Therapy

## 2017-08-21 DIAGNOSIS — J309 Allergic rhinitis, unspecified: Principal | ICD-10-CM

## 2017-08-21 DIAGNOSIS — H1010 Acute atopic conjunctivitis, unspecified eye: Secondary | ICD-10-CM

## 2017-08-21 MED ORDER — OLOPATADINE HCL 0.2 % OP SOLN: 1.0000 [drp] | Freq: Every day | OPHTHALMIC | 0 refills | Status: DC | PRN

## 2017-08-21 NOTE — Telephone Encounter (Signed)
Spoke with patient advised of plan patient verbalized understanding sent in refill for pataday eye drop

## 2017-08-21 NOTE — Telephone Encounter (Addendum)
Pt returning a nurse call please call before we close

## 2017-08-21 NOTE — Telephone Encounter (Signed)
-----   Message from Numa, MD sent at 08/21/2017  1:30 PM EDT ----- Regarding: Mane knight Can someone please call pt at [phone] and see what symptoms she has been having?

## 2017-08-21 NOTE — Telephone Encounter (Signed)
Please have pt take her allegra 180mg  1 tab twice a day and can also take either zantac 150mg  or pepcid 20mg  with the allegra until appt tomorrow.   She also should have olopatadine eye drops to use for eye itch as well.

## 2017-08-21 NOTE — Telephone Encounter (Signed)
Eyes itching, face swelling itching, hives around eyes and nasal congestion since Friday evening. Takes Allegra daily took Benadryl took 6 yesterday. Appointment made for 08/22/2017 @ 11:00am with Dr Verlin Fester. Patient would like something sent in please advise

## 2017-08-21 NOTE — Telephone Encounter (Signed)
Please call her at [phone].

## 2017-08-22 ENCOUNTER — Ambulatory Visit (INDEPENDENT_AMBULATORY_CARE_PROVIDER_SITE_OTHER): Payer: Medicaid Other | Admitting: Allergy and Immunology

## 2017-08-22 ENCOUNTER — Encounter: Payer: Self-pay | Admitting: Allergy and Immunology

## 2017-08-22 VITALS — BP 130/82 | HR 94 | Temp 98.5°F | Resp 16

## 2017-08-22 DIAGNOSIS — H1013 Acute atopic conjunctivitis, bilateral: Secondary | ICD-10-CM

## 2017-08-22 DIAGNOSIS — J4540 Moderate persistent asthma, uncomplicated: Secondary | ICD-10-CM | POA: Diagnosis not present

## 2017-08-22 DIAGNOSIS — J4531 Mild persistent asthma with (acute) exacerbation: Secondary | ICD-10-CM | POA: Diagnosis not present

## 2017-08-22 DIAGNOSIS — J4530 Mild persistent asthma, uncomplicated: Secondary | ICD-10-CM | POA: Diagnosis not present

## 2017-08-22 DIAGNOSIS — H1010 Acute atopic conjunctivitis, unspecified eye: Secondary | ICD-10-CM | POA: Insufficient documentation

## 2017-08-22 DIAGNOSIS — J309 Allergic rhinitis, unspecified: Secondary | ICD-10-CM | POA: Insufficient documentation

## 2017-08-22 DIAGNOSIS — J3089 Other allergic rhinitis: Secondary | ICD-10-CM | POA: Diagnosis not present

## 2017-08-22 MED ORDER — BUDESONIDE-FORMOTEROL FUMARATE 160-4.5 MCG/ACT IN AERO: 2.0000 | INHALATION_SPRAY | Freq: Two times a day (BID) | RESPIRATORY_TRACT | 5 refills | Status: DC

## 2017-08-22 MED ORDER — OLOPATADINE HCL 0.7 % OP SOLN: 1.0000 [drp] | Freq: Every day | OPHTHALMIC | 5 refills | Status: DC

## 2017-08-22 MED ORDER — ALBUTEROL SULFATE HFA 108 (90 BASE) MCG/ACT IN AERS: 1.0000 | INHALATION_SPRAY | Freq: Four times a day (QID) | RESPIRATORY_TRACT | 1 refills | Status: DC | PRN

## 2017-08-22 MED ORDER — LEVOCETIRIZINE DIHYDROCHLORIDE 5 MG PO TABS: 5.0000 mg | ORAL_TABLET | Freq: Every evening | ORAL | 5 refills | Status: DC

## 2017-08-22 MED ORDER — AZELASTINE HCL 0.1 % NA SOLN: NASAL | 5 refills | Status: DC

## 2017-08-22 MED ORDER — TIOTROPIUM BROMIDE MONOHYDRATE 1.25 MCG/ACT IN AERS: 2.0000 | INHALATION_SPRAY | Freq: Every day | RESPIRATORY_TRACT | 5 refills | Status: DC

## 2017-08-22 NOTE — Assessment & Plan Note (Signed)
Currently with suboptimal control.  Continue appropriate allergen avoidance measures.  A prescription has been provided for levocetirizine, 5 mg daily as needed.  A prescription has been provided for azelastine nasal spray, 1-2 sprays per nostril 2 times daily as needed. Proper nasal spray technique has been discussed and demonstrated.   Nasal saline spray (i.e., Simply Saline) or nasal saline lavage (i.e., NeilMed) is recommended as needed and prior to medicated nasal sprays.  To hasten symptom relief prednisone has been provided, 20 mg x 4 days, 10 mg x1 day, then stop.  Consider aeroallergen immunotherapy injections.
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
## 2017-08-22 NOTE — Assessment & Plan Note (Signed)
Severe flare.  Treatment plan as outlined above for allergic rhinitis.  A prescription has been provided for Pazeo, one drop per eye daily as needed.  Discontinue Pataday.  I have also recommended eye lubricant drops (i.e., Natural Tears) as needed.

## 2017-08-22 NOTE — Assessment & Plan Note (Signed)
Improved and currently well controlled.  Continue Nucala injections as prescribed, Symbicort 160-4.5 g, 2 inhalations twice daily, Spiriva Respimat 1.25 g, 2 inhalations daily, and albuterol every 6 hours if needed.  To maximize pulmonary deposition, a spacer has been provided along with instructions for its proper administration with an HFA inhaler.

## 2017-08-22 NOTE — Patient Instructions (Addendum)
Allergic rhinitis Currently with suboptimal control.  Continue appropriate allergen avoidance measures.  A prescription has been provided for levocetirizine, 5 mg daily as needed.  A prescription has been provided for azelastine nasal spray, 1-2 sprays per nostril 2 times daily as needed. Proper nasal spray technique has been discussed and demonstrated.   Nasal saline spray (i.e., Simply Saline) or nasal saline lavage (i.e., NeilMed) is recommended as needed and prior to medicated nasal sprays.  To hasten symptom relief prednisone has been provided, 20 mg x 4 days, 10 mg x1 day, then stop.  Consider aeroallergen immunotherapy injections.  Allergic conjunctivitis Severe flare.  Treatment plan as outlined above for allergic rhinitis.  A prescription has been provided for Pazeo, one drop per eye daily as needed.  Discontinue Pataday.  I have also recommended eye lubricant drops (i.e., Natural Tears) as needed.  Moderate persistent asthma Improved and currently well controlled.  Continue Nucala injections as prescribed, Symbicort 160-4.5 g, 2 inhalations twice daily, Spiriva Respimat 1.25 g, 2 inhalations daily, and albuterol every 6 hours if needed.  To maximize pulmonary deposition, a spacer has been provided along with instructions for its proper administration with an HFA inhaler.   Return in about 2-3 weeks to see Dr. Nelva Bush, or if symptoms worsen or fail to improve.

## 2017-08-23 ENCOUNTER — Encounter (HOSPITAL_COMMUNITY): Payer: Self-pay | Admitting: Physical Therapy

## 2017-08-28 ENCOUNTER — Encounter (HOSPITAL_COMMUNITY): Payer: Self-pay | Admitting: Physical Therapy

## 2017-08-29 ENCOUNTER — Ambulatory Visit (INDEPENDENT_AMBULATORY_CARE_PROVIDER_SITE_OTHER): Payer: Medicaid Other

## 2017-08-29 DIAGNOSIS — J4540 Moderate persistent asthma, uncomplicated: Secondary | ICD-10-CM

## 2017-08-30 ENCOUNTER — Encounter (HOSPITAL_COMMUNITY): Payer: Self-pay | Admitting: Physical Therapy

## 2017-08-31 ENCOUNTER — Ambulatory Visit: Payer: Medicaid Other | Admitting: Allergy

## 2017-09-06 ENCOUNTER — Telehealth: Payer: Self-pay | Admitting: *Deleted

## 2017-09-06 MED ORDER — MEGESTROL ACETATE 40 MG PO TABS: 40.0000 mg | ORAL_TABLET | Freq: Every day | ORAL | 1 refills | Status: DC

## 2017-09-06 MED ORDER — MEDROXYPROGESTERONE ACETATE 150 MG/ML IM SUSP: INTRAMUSCULAR | 4 refills | Status: DC

## 2017-09-06 NOTE — Telephone Encounter (Signed)
Refilled depo and megace

## 2017-09-11 ENCOUNTER — Ambulatory Visit (INDEPENDENT_AMBULATORY_CARE_PROVIDER_SITE_OTHER): Payer: Medicaid Other | Admitting: *Deleted

## 2017-09-11 ENCOUNTER — Encounter: Payer: Self-pay | Admitting: *Deleted

## 2017-09-11 DIAGNOSIS — Z308 Encounter for other contraceptive management: Secondary | ICD-10-CM

## 2017-09-11 DIAGNOSIS — Z3202 Encounter for pregnancy test, result negative: Secondary | ICD-10-CM | POA: Diagnosis not present

## 2017-09-11 DIAGNOSIS — Z3042 Encounter for surveillance of injectable contraceptive: Secondary | ICD-10-CM

## 2017-09-11 LAB — POCT URINE PREGNANCY: Preg Test, Ur: NEGATIVE

## 2017-09-11 MED ORDER — MEDROXYPROGESTERONE ACETATE 150 MG/ML IM SUSP
150.0000 mg | Freq: Once | INTRAMUSCULAR | Status: AC
Start: 2017-09-11 — End: 2017-09-11
  Administered 2017-09-11: 150 mg via INTRAMUSCULAR

## 2017-09-11 NOTE — Progress Notes (Signed)
Pt here for Depo. Pt tolerated shot well. Return in 12 weeks for next shot. JSY 

## 2017-09-14 ENCOUNTER — Ambulatory Visit: Payer: Medicaid Other | Admitting: Allergy

## 2017-09-15 DIAGNOSIS — E669 Obesity, unspecified: Secondary | ICD-10-CM | POA: Insufficient documentation

## 2017-09-25 ENCOUNTER — Other Ambulatory Visit: Payer: Self-pay

## 2017-09-25 MED ORDER — EPINEPHRINE 0.3 MG/0.3ML IJ SOAJ: 0.3000 mg | Freq: Once | INTRAMUSCULAR | 0 refills | Status: AC

## 2017-09-25 NOTE — Telephone Encounter (Signed)
EpiPen 0.3mg  refill requested. Sending in to the requesting pharmacy.

## 2017-09-26 ENCOUNTER — Ambulatory Visit (INDEPENDENT_AMBULATORY_CARE_PROVIDER_SITE_OTHER): Payer: Medicaid Other | Admitting: *Deleted

## 2017-09-26 DIAGNOSIS — J4540 Moderate persistent asthma, uncomplicated: Secondary | ICD-10-CM

## 2017-10-02 ENCOUNTER — Ambulatory Visit: Payer: Medicaid Other | Admitting: Allergy

## 2017-10-13 ENCOUNTER — Other Ambulatory Visit (INDEPENDENT_AMBULATORY_CARE_PROVIDER_SITE_OTHER): Payer: Self-pay | Admitting: Internal Medicine

## 2017-10-24 ENCOUNTER — Ambulatory Visit (INDEPENDENT_AMBULATORY_CARE_PROVIDER_SITE_OTHER): Payer: Medicaid Other

## 2017-10-24 DIAGNOSIS — J4540 Moderate persistent asthma, uncomplicated: Secondary | ICD-10-CM

## 2017-10-24 DIAGNOSIS — J4550 Severe persistent asthma, uncomplicated: Secondary | ICD-10-CM

## 2017-10-31 ENCOUNTER — Other Ambulatory Visit: Payer: Self-pay | Admitting: Adult Health

## 2017-11-21 ENCOUNTER — Ambulatory Visit (INDEPENDENT_AMBULATORY_CARE_PROVIDER_SITE_OTHER): Payer: Medicaid Other | Admitting: *Deleted

## 2017-11-21 DIAGNOSIS — J4550 Severe persistent asthma, uncomplicated: Secondary | ICD-10-CM | POA: Diagnosis not present

## 2017-11-21 DIAGNOSIS — J4540 Moderate persistent asthma, uncomplicated: Secondary | ICD-10-CM

## 2017-12-04 ENCOUNTER — Ambulatory Visit: Payer: Medicaid Other

## 2017-12-12 ENCOUNTER — Ambulatory Visit (INDEPENDENT_AMBULATORY_CARE_PROVIDER_SITE_OTHER): Payer: Self-pay | Admitting: Internal Medicine

## 2017-12-19 ENCOUNTER — Other Ambulatory Visit: Payer: Self-pay

## 2017-12-19 ENCOUNTER — Ambulatory Visit (INDEPENDENT_AMBULATORY_CARE_PROVIDER_SITE_OTHER): Payer: Medicaid Other | Admitting: Obstetrics & Gynecology

## 2017-12-19 ENCOUNTER — Ambulatory Visit (INDEPENDENT_AMBULATORY_CARE_PROVIDER_SITE_OTHER): Payer: Medicaid Other

## 2017-12-19 ENCOUNTER — Encounter: Payer: Self-pay | Admitting: Obstetrics & Gynecology

## 2017-12-19 DIAGNOSIS — N912 Amenorrhea, unspecified: Secondary | ICD-10-CM

## 2017-12-19 DIAGNOSIS — N943 Premenstrual tension syndrome: Secondary | ICD-10-CM

## 2017-12-19 DIAGNOSIS — J4540 Moderate persistent asthma, uncomplicated: Secondary | ICD-10-CM

## 2017-12-19 DIAGNOSIS — J4550 Severe persistent asthma, uncomplicated: Secondary | ICD-10-CM | POA: Diagnosis not present

## 2017-12-19 MED ORDER — MEDROXYPROGESTERONE ACETATE 10 MG PO TABS: 10.0000 mg | ORAL_TABLET | Freq: Every day | ORAL | 0 refills | Status: DC
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
## 2017-12-26 ENCOUNTER — Ambulatory Visit: Payer: Medicaid Other | Admitting: Adult Health

## 2017-12-29 ENCOUNTER — Ambulatory Visit: Payer: Medicaid Other | Admitting: Allergy

## 2018-01-08 ENCOUNTER — Other Ambulatory Visit: Payer: Self-pay

## 2018-01-08 MED ORDER — TRIAMCINOLONE ACETONIDE 0.025 % EX OINT: 1.0000 "application " | TOPICAL_OINTMENT | Freq: Two times a day (BID) | CUTANEOUS | 0 refills | Status: DC

## 2018-01-09 ENCOUNTER — Ambulatory Visit (INDEPENDENT_AMBULATORY_CARE_PROVIDER_SITE_OTHER): Payer: Medicaid Other | Admitting: Internal Medicine

## 2018-01-09 ENCOUNTER — Telehealth: Payer: Self-pay

## 2018-01-09 ENCOUNTER — Encounter (INDEPENDENT_AMBULATORY_CARE_PROVIDER_SITE_OTHER): Payer: Self-pay | Admitting: Internal Medicine

## 2018-01-09 VITALS — BP 120/90 | HR 72 | Temp 98.3°F | Resp 18 | Ht 67.0 in | Wt 227.1 lb

## 2018-01-09 DIAGNOSIS — K921 Melena: Secondary | ICD-10-CM | POA: Diagnosis not present

## 2018-01-09 DIAGNOSIS — K5909 Other constipation: Secondary | ICD-10-CM

## 2018-01-09 MED ORDER — PRAMOXINE HCL 1 % RE FOAM: 1.0000 "application " | Freq: Two times a day (BID) | RECTAL | 1 refills | Status: DC

## 2018-01-09 NOTE — Progress Notes (Signed)
Presenting complaint;  Follow-up for constipation patient complains of hematochezia.  Database and subjective:  Patient is 37 year old Afro-American female who is here for scheduled visit.  She was last seen on 06/13/2017 for chronic constipation hemorrhoidal bleeding and GERD.  She has a history of colonic polyps and has undergone 2 colonoscopies. First one was in 2014 and the last colonoscopy was in April 2017. She now presents with bleeding every time she has a bowel movement.  This been going on for 3 weeks.  She is constipated because she is run out of Trulance samples.  She states she has tried lactulose and Amitiza and Linzess in the past but they did not work.  Trulance works and she generally takes every second or third day.  She is not having an average of 2 bowel movements per week.  She has used Preparation H but it does not help.  She has tried proctoscopy form in the past which works she is not sure it is covered or not.  She states her stool is anywhere from 4-1 on [place] stool chart. She says heartburn is well controlled.  She has intermittent nausea.  She uses albuterol inhaler on as-needed basis.  She remains with chronic back pain.  She has been taking diclofenac recently. She is seeing a back specialist and is in need of injections.  She says she is not able to tolerate outpatient injection therapy but her insurance would not cover for inpatient injection therapy. She is watching her diet.  She is trying her best to lose weight.  She has lost 12 pounds since her last visit.  Current Medications: Outpatient Encounter Medications as of 01/09/2018  Medication Sig  . albuterol (PROVENTIL HFA;VENTOLIN HFA) 108 (90 Base) MCG/ACT inhaler Inhale 1-2 puffs into the lungs every 6 (six) hours as needed for wheezing.  Marland Kitchen albuterol (PROVENTIL) (2.5 MG/3ML) 0.083% nebulizer solution Take 3 mLs (2.5 mg total) by nebulization every 4 (four) hours as needed for wheezing or shortness of breath.  .  ALPRAZolam (XANAX XR) 2 MG 24 hr tablet Take 2 mg by mouth 2 (two) times daily.   Marland Kitchen azelastine (ASTELIN) 0.1 % nasal spray Use 1-2 sprays per nostril twice daily  . budesonide-formoterol (SYMBICORT) 160-4.5 MCG/ACT inhaler Inhale 2 puffs into the lungs 2 (two) times daily.  . diclofenac (VOLTAREN) 75 MG EC tablet Take 75 mg by mouth as needed. Take 1 tablet by mouth two times daily after meals for inflammation, pain, and swelling.   . esomeprazole (NEXIUM) 40 MG capsule TAKE 1 CAPSULE ONCE DAILY AT 12 NOON.  . fexofenadine (ALLEGRA) 180 MG tablet Take 1 tablet (180 mg total) by mouth daily.  . fluticasone (FLOVENT HFA) 110 MCG/ACT inhaler Inhale 2 puffs into the lungs daily.  Marland Kitchen lamoTRIgine (LAMICTAL) 25 MG tablet Take 1 tablet (25 mg total) by mouth daily. (Patient taking differently: Take 150 mg by mouth daily. )  . levocetirizine (XYZAL) 5 MG tablet Take 1 tablet (5 mg total) by mouth every evening.  . Mepolizumab 100 MG SOLR Inject 100 mg into the skin.  . metoprolol succinate (TOPROL-XL) 50 MG 24 hr tablet Take 50 mg by mouth.  . mometasone (ELOCON) 0.1 % ointment Apply 1 application topically 2 (two) times daily as needed (eczema).  . montelukast (SINGULAIR) 10 MG tablet Take 1 tablet (10 mg total) by mouth at bedtime.  Marland Kitchen nystatin (MYCOSTATIN/NYSTOP) powder APPLY TO AFFECTED AREA 2 TO 3 TIMES A DAY AS NEEDED.  Marland Kitchen Olopatadine HCl (PAZEO)  0.7 % SOLN Place 1 drop into both eyes daily.  . ondansetron (ZOFRAN) 8 MG tablet Take 8 mg by mouth every 8 (eight) hours as needed for nausea or vomiting.   Marland Kitchen Plecanatide 3 MG TABS Take 3 mg by mouth as needed.   . Tiotropium Bromide Monohydrate (SPIRIVA RESPIMAT) 1.25 MCG/ACT AERS Inhale 2 puffs into the lungs daily.  Marland Kitchen triamcinolone (KENALOG) 0.025 % ointment Apply 1 application topically 2 (two) times daily.  . Urea (ALUVEA) 39 % CREA Apply 1 application topically daily as needed (to heels).   . [DISCONTINUED] cariprazine (VRAYLAR) capsule Take 3 mg by  mouth daily.  . [DISCONTINUED] medroxyPROGESTERone (DEPO-PROVERA) 150 MG/ML injection INJECT 1 VIAL INTRAMUSCULARLY EVERY 3 MONTHS IN OFFICE. (Patient not taking: Reported on 12/19/2017)  . [DISCONTINUED] medroxyPROGESTERone (PROVERA) 10 MG tablet Take 1 tablet (10 mg total) by mouth daily. (Patient not taking: Reported on 01/09/2018)  . [DISCONTINUED] megestrol (MEGACE) 40 MG tablet Take 1 tablet (40 mg total) by mouth daily. (Patient not taking: Reported on 01/09/2018)  . [DISCONTINUED] mirabegron ER (MYRBETRIQ) 50 MG TB24 tablet Take 50 mg by mouth daily.   Facility-Administered Encounter Medications as of 01/09/2018  Medication  . Mepolizumab SOLR 100 mg     Objective: Blood pressure 120/90, pulse 72, temperature 98.3 F (36.8 C), temperature source Oral, resp. rate 18, height 5\' 7"  (1.702 m), weight 227 lb 1.6 oz (103 kg). Patient is alert and in no acute distress. Conjunctiva is pink. Sclera is nonicteric Oropharyngeal mucosa is normal. No neck masses or thyromegaly noted. Cardiac exam with regular rhythm normal S1 and S2. No murmur or gallop noted. Lungs are clear to auscultation. Abdomenis full but soft and nontender with organomegaly or masses. No LE edema or clubbing noted.    Assessment:  #1.  Chronic constipation secondary to medications.  She needs to be back on true [place] which is helped.  Will provide samples when these are available.  In the meantime she should continue with high-fiber diet.  #2.  Hematochezia secondary to hemorrhoids in the setting of constipation.  Hopefully hematochezia will resolve when she is not constipated.   Plan:  Proctofoam HC 2.5% to be applied to anal canal twice daily for 1 to 2 weeks or after PRN.  Prescription sent to her pharmacy. Trulance 3 mg p.o. daily or every other day.  Will call patient when we have samples. Office visit in 1 year.

## 2018-01-09 NOTE — Telephone Encounter (Signed)
 (  Error: documented the wrong pt) Pt called stating she was stung by a bee Sunday, she put tobacco on it to pull the stinger out, but it's still red and swollen, she took benadryl after it happened, she has an appt with Dr. Nelva Bush Friday Aug 9, she states she doesn't have enough gas to get her here for two different appt dates. She wants to know what can she do.      Documentation

## 2018-01-09 NOTE — Telephone Encounter (Signed)
   Error:Documented wrong pt chart Discussed with Sharon Vazquez verbally that she can ice the area, use topical steroids (ie. Hydrocortisone, triamcinolone) as well as antihistamine (ie. Benadryl, zyrtec/xyzal/allegra) to help with itch.    It can take days to weeks (upwards of 2 weeks) for local reactions to stings to fully resolve.

## 2018-01-09 NOTE — Patient Instructions (Signed)
Continue high-fiber diet. Proctofoam for 1 to 2 weeks and thereafter as needed. Should take Trulance schedule rather than on as-needed basis.  He can try every other day or every Monday Wednesday and Friday.

## 2018-01-12 ENCOUNTER — Ambulatory Visit (INDEPENDENT_AMBULATORY_CARE_PROVIDER_SITE_OTHER): Payer: Medicaid Other | Admitting: Allergy

## 2018-01-12 ENCOUNTER — Encounter: Payer: Self-pay | Admitting: Allergy

## 2018-01-12 VITALS — BP 108/80 | HR 84 | Resp 16 | Ht 66.0 in | Wt 225.4 lb

## 2018-01-12 DIAGNOSIS — J3089 Other allergic rhinitis: Secondary | ICD-10-CM

## 2018-01-12 DIAGNOSIS — T6391XD Toxic effect of contact with unspecified venomous animal, accidental (unintentional), subsequent encounter: Secondary | ICD-10-CM | POA: Diagnosis not present

## 2018-01-12 DIAGNOSIS — J4540 Moderate persistent asthma, uncomplicated: Secondary | ICD-10-CM | POA: Diagnosis not present

## 2018-01-12 DIAGNOSIS — H1013 Acute atopic conjunctivitis, bilateral: Secondary | ICD-10-CM

## 2018-01-12 MED ORDER — TIOTROPIUM BROMIDE MONOHYDRATE 1.25 MCG/ACT IN AERS: 2.0000 | INHALATION_SPRAY | Freq: Every day | RESPIRATORY_TRACT | 5 refills | Status: DC

## 2018-01-12 MED ORDER — ALBUTEROL SULFATE (2.5 MG/3ML) 0.083% IN NEBU: 2.5000 mg | INHALATION_SOLUTION | RESPIRATORY_TRACT | 1 refills | Status: DC | PRN

## 2018-01-12 MED ORDER — EPINEPHRINE 0.3 MG/0.3ML IJ SOAJ: 0.3000 mg | Freq: Once | INTRAMUSCULAR | 2 refills | Status: AC

## 2018-01-12 NOTE — Patient Instructions (Addendum)
Allergic rhinitis  Continue appropriate allergen avoidance measures.  Continue Allegra 180mg  and Xyzal 5 mg daily as needed.  Continue  azelastine nasal spray, 1-2 sprays per nostril 2 times daily.   Nasal saline spray (i.e., Simply Saline) or nasal saline lavage (i.e., NeilMed) is recommended as needed and prior to medicated nasal sprays.  Consider aeroallergen immunotherapy injections.  Allergic conjunctivitis  Treatment plan as outlined above for allergic rhinitis.  Continue use of Patanol 1 drop each eye up to twice a day for itchy/watery/redy  Can also use eye lubricant drops (i.e., Natural Tears) as needed.  Moderate persistent asthma  Continue Nucala injections monthly, Symbicort 160-4.5 g, 2 inhalations twice daily, Spiriva Respimat 1.25 g, 2 inhalations daily, and albuterol every 4-6 hours if needed.  To maximize pulmonary deposition, use Symbicort and your albuterol inhaler with spacer that has been provided previousy   Return in about 3-4 months.   Let us know as soon as possible if you are pregnant.

## 2018-01-12 NOTE — Progress Notes (Signed)
Follow-up Note  RE: Sharon Vazquez MRN: [id number] DOB: 02/12/1981 Date of Office Visit: 01/12/2018   History of present illness: Sharon Vazquez is a 37 y.o. female presenting today for follow-up of allergic rhinitis, asthma, and hymenoptera venom hypersensitivity.  She was last seen in the office on 08/22/17 by Dr. Verlin Fester.    Allergic rhinitis: she reports that she has had increased nasal drainage recently. She is using Allegra and Xyzal every day. She uses azelastine every other day. She was recently prescribed Patanol eye drops by her eye doctor for eye irritation, so she has discontinued Pazeo and Pataday.  Asthma: she reports a recent history of bronchitis which was treated with azithromycin one month ago. Since that time she reports mild dyspnea on exertion and cough. She continues on Nucala, Symbicort, and Spiriva. She has had to use her albuterol nebulizer two times per week for symptoms.  She has not required steroids for respiratory issues since last visit.  Denies nighttime awakenings.   Hymenoptera venom hypersensitivity: she reports that she was stung by a yellow jacket 5 days ago. She had swelling and pain at the site of the sting.  Denies any respiratory, CV or GI related symptoms following sting.   She did not require epinephrine use. She has been using topical triamcinolone and PO benadryl for itching and swelling. She put tobacco leaves on the site and eventually was able to remove the stinger.   She states she has lost weight intentionally but has gained some back.  There is some concern that she may be pregnant however has not performed any testing to confirm.   Review of systems: Review of Systems  Constitutional: Negative for chills, fever and malaise/fatigue.  HENT: Positive for congestion. Negative for ear discharge, nosebleeds and sore throat.   Eyes: Negative for pain, discharge and redness.  Respiratory: Positive for cough and shortness of breath.  Negative for hemoptysis, sputum production and wheezing.   Cardiovascular: Negative for chest pain.  Gastrointestinal: Negative for abdominal pain, constipation, diarrhea, nausea and vomiting.  Musculoskeletal: Positive for back pain.  Skin: Positive for itching and rash.  Neurological: Negative for headaches.    All other systems negative unless noted above in HPI  Past medical/social/surgical/family history have been reviewed and are unchanged unless specifically indicated below.  No changes  Medication List: Allergies as of 01/12/2018      Reactions   Bee Venom Anaphylaxis   Dilaudid [hydromorphone Hcl] Anaphylaxis, Hives   Has tolerated morphine since this reaction with Benadryl   Hydromorphone Anaphylaxis, Hives   Other Anaphylaxis   Bandaid, Nuts, Peanut Oil    Peanut Oil Anaphylaxis   Senna Anaphylaxis, Hives   Adhesive [tape] Hives, Other (See Comments)   Pulls skin off (use paper tape)   Latex Hives   Bee Pollen Swelling   Ibuprofen    IBS history-can't take      Medication List        Accurate as of 01/12/18  5:04 PM. Always use your most recent med list.          albuterol (2.5 MG/3ML) 0.083% nebulizer solution Commonly known as:  PROVENTIL Take 3 mLs (2.5 mg total) by nebulization every 4 (four) hours as needed for wheezing or shortness of breath.   albuterol 108 (90 Base) MCG/ACT inhaler Commonly known as:  PROVENTIL HFA;VENTOLIN HFA Inhale 1-2 puffs into the lungs every 6 (six) hours as needed for wheezing.   ALPRAZolam 2 MG 24 hr tablet  Commonly known as:  XANAX XR Take 2 mg by mouth 2 (two) times daily.   ALUVEA 39 % Crea Generic drug:  Urea Apply 1 application topically daily as needed (to heels).   azelastine 0.1 % nasal spray Commonly known as:  ASTELIN Use 1-2 sprays per nostril twice daily   budesonide-formoterol 160-4.5 MCG/ACT inhaler Commonly known as:  SYMBICORT Inhale 2 puffs into the lungs 2 (two) times daily.   diclofenac 75 MG  EC tablet Commonly known as:  VOLTAREN Take 75 mg by mouth as needed. Take 1 tablet by mouth two times daily after meals for inflammation, pain, and swelling.   EPINEPHrine 0.3 mg/0.3 mL Soaj injection Commonly known as:  EPI-PEN Inject 0.3 mg into the muscle once.   esomeprazole 40 MG capsule Commonly known as:  NEXIUM TAKE 1 CAPSULE ONCE DAILY AT 12 NOON.   fexofenadine 180 MG tablet Commonly known as:  ALLEGRA Take 1 tablet (180 mg total) by mouth daily.   fluticasone 110 MCG/ACT inhaler Commonly known as:  FLOVENT HFA Inhale 2 puffs into the lungs daily.   lamoTRIgine 25 MG tablet Commonly known as:  LAMICTAL Take 1 tablet (25 mg total) by mouth daily.   levocetirizine 5 MG tablet Commonly known as:  XYZAL Take 1 tablet (5 mg total) by mouth every evening.   Mepolizumab 100 MG Solr Inject 100 mg into the skin.   metoprolol succinate 50 MG 24 hr tablet Commonly known as:  TOPROL-XL Take 50 mg by mouth.   mometasone 0.1 % ointment Commonly known as:  ELOCON Apply 1 application topically 2 (two) times daily as needed (eczema).   montelukast 10 MG tablet Commonly known as:  SINGULAIR Take 1 tablet (10 mg total) by mouth at bedtime.   nystatin powder Commonly known as:  MYCOSTATIN/NYSTOP APPLY TO AFFECTED AREA 2 TO 3 TIMES A DAY AS NEEDED.   Olopatadine HCl 0.7 % Soln Place 1 drop into both eyes daily.   ondansetron 8 MG tablet Commonly known as:  ZOFRAN Take 8 mg by mouth every 8 (eight) hours as needed for nausea or vomiting.   Plecanatide 3 MG Tabs Take 3 mg by mouth as needed.   pramoxine 1 % foam Commonly known as:  PROCTOFOAM Place 1 application rectally 2 (two) times daily.   Tiotropium Bromide Monohydrate 1.25 MCG/ACT Aers Inhale 2 puffs into the lungs daily.   triamcinolone 0.025 % ointment Commonly known as:  KENALOG Apply 1 application topically 2 (two) times daily.       Known medication allergies: Allergies  Allergen Reactions  .  Bee Venom Anaphylaxis  . Dilaudid [Hydromorphone Hcl] Anaphylaxis and Hives    Has tolerated morphine since this reaction with Benadryl  . Hydromorphone Anaphylaxis and Hives  . Other Anaphylaxis    Bandaid, Nuts, Peanut Oil   . Peanut Oil Anaphylaxis  . Senna Anaphylaxis and Hives  . Adhesive [Tape] Hives and Other (See Comments)    Pulls skin off (use paper tape)  . Latex Hives  . Bee Pollen Swelling  . Ibuprofen     IBS history-can't take     Physical examination: Blood pressure 108/80, pulse 84, resp. rate 16, height 5\' 6"  (1.676 m), weight 225 lb 6.4 oz (102.2 kg), SpO2 98 %.  General: Alert, interactive, in no acute distress. HEENT: TMs pearly gray, turbinates minimally edematous without discharge, post-pharynx mildly erythematous. Neck: Supple without lymphadenopathy. Lungs: Clear to auscultation without wheezing, rhonchi or rales. {no increased work of breathing.  CV: Normal S1, S2 without murmurs. Abdomen: Nondistended, nontender. Skin: Warm and dry. Small cluster of hypopigmented papules on the right upper cheek.  Extremities:  No clubbing, cyanosis or edema. Neuro:   Grossly intact.  Diagnositics/Labs: Labs: None  Spirometry: FEV1 2.98L 106%, FVC 3.87L115%, FEV1/FVC 92%. Nonobstructive pattern  Allergy testing: None   Assessment and plan:   Allergic rhinitis  Continue appropriate allergen avoidance measures.  Continue Allegra 180mg  and Xyzal 5 mg daily as needed.  Continue  azelastine nasal spray, 1-2 sprays per nostril 2 times daily.  Advised to increase to daily use at this time with increased nasal drainage  Nasal saline spray (i.e., Simply Saline) or nasal saline lavage (i.e., NeilMed) is recommended as needed and prior to medicated nasal sprays.  Consider aeroallergen immunotherapy injections.  Allergic conjunctivitis  Treatment plan as outlined above for allergic rhinitis.  Continue use of Patanol 1 drop each eye up to twice a day for  itchy/watery/redy  Can also use eye lubricant drops (i.e., Natural Tears) as needed.  Moderate persistent asthma  Continue Nucala injections monthly, Symbicort 160-4.5 g, 2 inhalations twice daily, Spiriva Respimat 1.25 g, 2 inhalations daily, and albuterol every 4-6 hours if needed.  To maximize pulmonary deposition, use Symbicort and your albuterol inhaler with spacer that has been provided previously  Hymenoptera hypersensitivity  - recent sting with local reaction without systemic symptoms  - stinger has been removed  - advised can continue use of ice to area, triamcinolone topically and antihistamines as above.  If continue pain at sight ibuprofen or tylenol if able to tolerate as needed.   Return in about 3-4 months.   Let us know as soon as possible if you are pregnant.     I appreciate the opportunity to take part in Laurabeth's care. Please do not hesitate to contact me with questions.  Sincerely,   Prudy Feeler, MD Allergy/Immunology [facility]

## 2018-01-16 ENCOUNTER — Ambulatory Visit (INDEPENDENT_AMBULATORY_CARE_PROVIDER_SITE_OTHER): Payer: Medicaid Other

## 2018-01-16 DIAGNOSIS — J4540 Moderate persistent asthma, uncomplicated: Secondary | ICD-10-CM

## 2018-01-24 ENCOUNTER — Ambulatory Visit (INDEPENDENT_AMBULATORY_CARE_PROVIDER_SITE_OTHER): Payer: Medicaid Other | Admitting: Adult Health

## 2018-01-24 ENCOUNTER — Encounter: Payer: Self-pay | Admitting: Adult Health

## 2018-01-24 VITALS — BP 127/73 | HR 71 | Ht 66.0 in | Wt 221.0 lb

## 2018-01-24 DIAGNOSIS — R61 Generalized hyperhidrosis: Secondary | ICD-10-CM | POA: Insufficient documentation

## 2018-01-24 DIAGNOSIS — N926 Irregular menstruation, unspecified: Secondary | ICD-10-CM

## 2018-01-24 MED ORDER — PRENATAL PLUS 27-1 MG PO TABS: 1.0000 | ORAL_TABLET | Freq: Every day | ORAL | 12 refills | Status: DC

## 2018-01-24 NOTE — Progress Notes (Signed)
  Subjective:     Patient ID: Sharon Vazquez, female   DOB: 1980/10/03, 37 y.o.   MRN: [id number]  HPI Babetta is a 37 year old black female in to discuss possible peri menopause.She had last depo in April and did not have a period til July and then had one in August and having night sweats.She is thinking she may want a baby next year.She says she has lost weight. PCP is Dr Posey Pronto.   Review of Systems Periods irregular +night sweats  No pain with sex  Reviewed past medical,surgical, social and family history. Reviewed medications and allergies.     Objective:   Physical Exam BP 127/73 (BP Location: Left Arm, Patient Position: Sitting, Cuff Size: Normal)   Pulse 71   Ht 5\' 6"  (1.676 m)   Wt 221 lb (100.2 kg)   LMP 01/17/2018   BMI 35.67 kg/m   Skin warm and dry. Neck: mid line trachea, normal thyroid, good ROM, no lymphadenopathy noted. Lungs: clear to ausculation bilaterally. Cardiovascular: regular rate and rhythm.Discussed her meds could cause night sweats.And since she is not using birth control will Rx PNV.  Will check TSH and FSH.     Assessment:     1. Irregular periods   2. Night sweats       Plan:     Check TSH and FSH Meds ordered this encounter  Medications  . prenatal vitamin w/FE, FA (PRENATAL 1 + 1) 27-1 MG TABS tablet    Sig: Take 1 tablet by mouth daily at 12 noon.    Dispense:  30 each    Refill:  12    Order Specific Question:   Supervising Provider    Answer:   Tania Ade H [[id number]]   F/U prn

## 2018-01-25 ENCOUNTER — Telehealth: Payer: Self-pay | Admitting: Adult Health

## 2018-01-25 LAB — FOLLICLE STIMULATING HORMONE: FSH: 5.6 m[IU]/mL

## 2018-01-25 LAB — TSH: TSH: 1.18 u[IU]/mL (ref 0.450–4.500)

## 2018-01-25 NOTE — Telephone Encounter (Signed)
Pt aware that labs normal  

## 2018-02-01 ENCOUNTER — Other Ambulatory Visit: Payer: Self-pay | Admitting: Allergy & Immunology

## 2018-02-13 ENCOUNTER — Ambulatory Visit (INDEPENDENT_AMBULATORY_CARE_PROVIDER_SITE_OTHER): Payer: Medicaid Other | Admitting: *Deleted

## 2018-02-13 DIAGNOSIS — J4550 Severe persistent asthma, uncomplicated: Secondary | ICD-10-CM

## 2018-02-13 DIAGNOSIS — J4540 Moderate persistent asthma, uncomplicated: Secondary | ICD-10-CM

## 2018-02-22 ENCOUNTER — Encounter: Payer: Self-pay | Admitting: Adult Health

## 2018-02-22 ENCOUNTER — Encounter (INDEPENDENT_AMBULATORY_CARE_PROVIDER_SITE_OTHER): Payer: Self-pay

## 2018-02-22 ENCOUNTER — Other Ambulatory Visit: Payer: Self-pay

## 2018-02-22 ENCOUNTER — Ambulatory Visit (INDEPENDENT_AMBULATORY_CARE_PROVIDER_SITE_OTHER): Payer: Medicaid Other | Admitting: Adult Health

## 2018-02-22 VITALS — BP 135/81 | HR 76 | Ht 66.0 in | Wt 214.0 lb

## 2018-02-22 DIAGNOSIS — R350 Frequency of micturition: Secondary | ICD-10-CM | POA: Diagnosis not present

## 2018-02-22 DIAGNOSIS — Z3202 Encounter for pregnancy test, result negative: Secondary | ICD-10-CM

## 2018-02-22 DIAGNOSIS — R3915 Urgency of urination: Secondary | ICD-10-CM

## 2018-02-22 DIAGNOSIS — Z319 Encounter for procreative management, unspecified: Secondary | ICD-10-CM | POA: Diagnosis not present

## 2018-02-22 LAB — POCT URINE PREGNANCY: Preg Test, Ur: NEGATIVE

## 2018-02-22 LAB — POCT URINALYSIS DIPSTICK OB
Blood, UA: NEGATIVE
Glucose, UA: NEGATIVE
Ketones, UA: NEGATIVE
Leukocytes, UA: NEGATIVE
Nitrite, UA: NEGATIVE
PROTEIN: NEGATIVE

## 2018-02-22 NOTE — Progress Notes (Signed)
  Subjective:     Patient ID: Sharon Vazquez, female   DOB: 1981-01-14, 37 y.o.   MRN: [id number]  HPI Sharon Vazquez is a 37 year old black female, worked in for urinary frequency, and urgency.And she wants to be pregnant.  Review of Systems Urinary frequency Urinary urgency  Reviewed past medical,surgical, social and family history. Reviewed medications and allergies.     Objective:   Physical Exam BP 135/81 (BP Location: Right Arm, Patient Position: Sitting, Cuff Size: Normal)   Pulse 76   Ht 5\' 6"  (1.676 m)   Wt 214 lb (97.1 kg)   LMP 02/08/2018   BMI 34.54 kg/m UPT negative, urine dipstick negative.No CVAT Discussed timing of sex, looks like she is having 21-22 day cycles.will check progesterone level. And will send urine for UA C&S.     Assessment:     1. Urinary frequency   2. Patient desires pregnancy   3. Pregnancy examination or test, negative result       Plan:    UA C&S sent Check progesterone level 9/25 F/U prn

## 2018-02-23 ENCOUNTER — Telehealth: Payer: Self-pay | Admitting: *Deleted

## 2018-02-23 LAB — URINALYSIS, ROUTINE W REFLEX MICROSCOPIC
Bilirubin, UA: NEGATIVE
GLUCOSE, UA: NEGATIVE
KETONES UA: NEGATIVE
LEUKOCYTES UA: NEGATIVE
Nitrite, UA: NEGATIVE
Protein, UA: NEGATIVE
RBC, UA: NEGATIVE
Specific Gravity, UA: 1.015 (ref 1.005–1.030)
Urobilinogen, Ur: 0.2 mg/dL (ref 0.2–1.0)
pH, UA: 5.5 (ref 5.0–7.5)

## 2018-02-23 NOTE — Telephone Encounter (Signed)
Pt called requesting lab results. Informed pt that results were not back yet and she could try calling back Monday or checking mychart. Pt verbalized understanding.

## 2018-02-24 LAB — URINE CULTURE

## 2018-02-26 ENCOUNTER — Telehealth: Payer: Self-pay | Admitting: Adult Health

## 2018-02-26 DIAGNOSIS — J069 Acute upper respiratory infection, unspecified: Secondary | ICD-10-CM | POA: Insufficient documentation

## 2018-02-26 HISTORY — DX: Acute upper respiratory infection, unspecified: J06.9

## 2018-02-26 MED ORDER — AMPICILLIN 500 MG PO CAPS: 500.0000 mg | ORAL_CAPSULE | Freq: Four times a day (QID) | ORAL | 0 refills | Status: DC

## 2018-02-26 NOTE — Telephone Encounter (Signed)
Pt aware had group B strept in urine will rx ampicillin 500 mg 1 QID for 7 days

## 2018-03-01 ENCOUNTER — Telehealth: Payer: Self-pay | Admitting: Adult Health

## 2018-03-01 LAB — PROGESTERONE: Progesterone: 8.3 ng/mL

## 2018-03-01 NOTE — Telephone Encounter (Signed)
Pt aware that progesterone level 8.3

## 2018-03-02 ENCOUNTER — Telehealth: Payer: Self-pay

## 2018-03-02 NOTE — Telephone Encounter (Signed)
Received PA for Allegra, call and left message for pt to call office in regards to this. Will need to inform pt to buy it OTC.

## 2018-03-05 ENCOUNTER — Telehealth: Payer: Self-pay | Admitting: *Deleted

## 2018-03-05 NOTE — Telephone Encounter (Signed)
Patient called and requested to be switched from [place] to the shot she only needs to get every 3 months per Dr Nelva Bush. States at her last visit she was advised of this also is it ok for her to get Nucala 03/13/18 then in November 2019 switch to new biologic please advise. Patient aware wont get called until tomorrow due to Dr Nelva Bush not being in office today

## 2018-03-05 NOTE — Telephone Encounter (Signed)
Patient notified and will stop by [facility] office tomorrow for samples of Allegra.  Patient voiced understanding.

## 2018-03-06 ENCOUNTER — Telehealth: Payer: Self-pay | Admitting: *Deleted

## 2018-03-06 NOTE — Telephone Encounter (Signed)
Dr Ernst Bowler can you address or should we wait on Dr Nelva Bush please advise

## 2018-03-06 NOTE — Telephone Encounter (Signed)
Pt has had some nausea but also diarrhea, period due to start this week, if no period check HPT, on Saturday morning, first urine

## 2018-03-07 NOTE — Telephone Encounter (Signed)
Yes that is OK to change to [place].   Salvatore Marvel, MD [facility]

## 2018-03-07 NOTE — Telephone Encounter (Signed)
I will change patient over this month after her Nucala injection

## 2018-03-07 NOTE — Telephone Encounter (Signed)
Spoke to patient advised we could switch her per Dr Ernst Bowler. Will get next Nucala on 03/13/18 then be switched Sharon Vazquez patient aware you will be in contact with her in regards to this.

## 2018-03-13 ENCOUNTER — Ambulatory Visit (INDEPENDENT_AMBULATORY_CARE_PROVIDER_SITE_OTHER): Payer: Medicaid Other

## 2018-03-13 ENCOUNTER — Ambulatory Visit (INDEPENDENT_AMBULATORY_CARE_PROVIDER_SITE_OTHER): Payer: Self-pay | Admitting: Internal Medicine

## 2018-03-13 DIAGNOSIS — J4550 Severe persistent asthma, uncomplicated: Secondary | ICD-10-CM

## 2018-03-13 NOTE — Telephone Encounter (Signed)
Thanks everyone

## 2018-03-20 ENCOUNTER — Other Ambulatory Visit (INDEPENDENT_AMBULATORY_CARE_PROVIDER_SITE_OTHER): Payer: Self-pay | Admitting: Internal Medicine

## 2018-03-23 ENCOUNTER — Telehealth (INDEPENDENT_AMBULATORY_CARE_PROVIDER_SITE_OTHER): Payer: Self-pay | Admitting: *Deleted

## 2018-03-23 NOTE — Telephone Encounter (Signed)
Patient requested to be weighed. On January 09 2018 she weighed @227  lb 1.6 oz February 22 2018 she weighed 214 lbs. Today , March 23 2018 she weighed 207 lbs.  She has a follow up appointment with Terri on Monday, October 21 , 2019 for hemorrhoids.

## 2018-03-26 ENCOUNTER — Encounter (INDEPENDENT_AMBULATORY_CARE_PROVIDER_SITE_OTHER): Payer: Self-pay | Admitting: Internal Medicine

## 2018-03-26 ENCOUNTER — Ambulatory Visit (INDEPENDENT_AMBULATORY_CARE_PROVIDER_SITE_OTHER): Payer: Medicaid Other | Admitting: Internal Medicine

## 2018-03-26 VITALS — BP 160/100 | HR 68 | Temp 98.4°F | Ht 67.0 in | Wt 208.2 lb

## 2018-03-26 DIAGNOSIS — K640 First degree hemorrhoids: Secondary | ICD-10-CM
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
## 2018-03-26 NOTE — Patient Instructions (Signed)
Continue the Proctofoam. Will refer to Dr. Arnoldo Morale.

## 2018-04-10 ENCOUNTER — Ambulatory Visit (INDEPENDENT_AMBULATORY_CARE_PROVIDER_SITE_OTHER): Payer: Medicaid Other | Admitting: General Surgery

## 2018-04-10 ENCOUNTER — Encounter: Payer: Self-pay | Admitting: General Surgery

## 2018-04-10 VITALS — BP 144/79 | HR 82 | Temp 97.7°F | Resp 20 | Wt 212.0 lb

## 2018-04-10 DIAGNOSIS — K644 Residual hemorrhoidal skin tags: Secondary | ICD-10-CM | POA: Diagnosis not present

## 2018-04-10 DIAGNOSIS — K648 Other hemorrhoids: Secondary | ICD-10-CM | POA: Diagnosis not present
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
## 2018-04-10 NOTE — Patient Instructions (Signed)
Hemorrhoids Hemorrhoids are swollen veins in and around the rectum or anus. There are two types of hemorrhoids:  Internal hemorrhoids. These occur in the veins that are just inside the rectum. They may poke through to the outside and become irritated and painful.  External hemorrhoids. These occur in the veins that are outside of the anus and can be felt as a painful swelling or hard lump near the anus.  Most hemorrhoids do not cause serious problems, and they can be managed with home treatments such as diet and lifestyle changes. If home treatments do not help your symptoms, procedures can be done to shrink or remove the hemorrhoids. What are the causes? This condition is caused by increased pressure in the anal area. This pressure may result from various things, including:  Constipation.  Straining to have a bowel movement.  Diarrhea.  Pregnancy.  Obesity.  Sitting for long periods of time.  Heavy lifting or other activity that causes you to strain.  Anal sex.  What are the signs or symptoms? Symptoms of this condition include:  Pain.  Anal itching or irritation.  Rectal bleeding.  Leakage of stool (feces).  Anal swelling.  One or more lumps around the anus.  How is this diagnosed? This condition can often be diagnosed through a visual exam. Other exams or tests may also be done, such as:  Examination of the rectal area with a gloved hand (digital rectal exam).  Examination of the anal canal using a small tube (anoscope).  A blood test, if you have lost a significant amount of blood.  A test to look inside the colon (sigmoidoscopy or colonoscopy).  How is this treated? This condition can usually be treated at home. However, various procedures may be done if dietary changes, lifestyle changes, and other home treatments do not help your symptoms. These procedures can help make the hemorrhoids smaller or remove them completely. Some of these procedures involve  surgery, and others do not. Common procedures include:  Rubber band ligation. Rubber bands are placed at the base of the hemorrhoids to cut off the blood supply to them.  Sclerotherapy. Medicine is injected into the hemorrhoids to shrink them.  Infrared coagulation. A type of light energy is used to get rid of the hemorrhoids.  Hemorrhoidectomy surgery. The hemorrhoids are surgically removed, and the veins that supply them are tied off.  Stapled hemorrhoidopexy surgery. A circular stapling device is used to remove the hemorrhoids and use staples to cut off the blood supply to them.  Follow these instructions at home: Eating and drinking  Eat foods that have a lot of fiber in them, such as whole grains, beans, nuts, fruits, and vegetables. Ask your health care provider about taking products that have added fiber (fiber supplements).  Drink enough fluid to keep your urine clear or pale yellow. Managing pain and swelling  Take warm sitz baths for 20 minutes, 3-4 times a day to ease pain and discomfort.  If directed, apply ice to the affected area. Using ice packs between sitz baths may be helpful. ? Put ice in a plastic bag. ? Place a towel between your skin and the bag. ? Leave the ice on for 20 minutes, 2-3 times a day. General instructions  Take over-the-counter and prescription medicines only as told by your health care provider.  Use medicated creams or suppositories as told.  Exercise regularly.  Go to the bathroom when you have the urge to have a bowel movement. Do not wait.    Avoid straining to have bowel movements.  Keep the anal area dry and clean. Use wet toilet paper or moist towelettes after a bowel movement.  Do not sit on the toilet for long periods of time. This increases blood pooling and pain. Contact a health care provider if:  You have increasing pain and swelling that are not controlled by treatment or medicine.  You have uncontrolled bleeding.  You  have difficulty having a bowel movement, or you are unable to have a bowel movement.  You have pain or inflammation outside the area of the hemorrhoids. This information is not intended to replace advice given to you by your health care provider. Make sure you discuss any questions you have with your health care provider. Document Released: 05/20/2000 Document Revised: 10/21/2015 Document Reviewed: 02/04/2015 Elsevier Interactive Patient Education  2018 [employer] Inc[employer]  
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
## 2018-04-11 ENCOUNTER — Ambulatory Visit (INDEPENDENT_AMBULATORY_CARE_PROVIDER_SITE_OTHER): Payer: Medicaid Other | Admitting: *Deleted

## 2018-04-11 DIAGNOSIS — J4540 Moderate persistent asthma, uncomplicated: Secondary | ICD-10-CM

## 2018-04-11 DIAGNOSIS — J4550 Severe persistent asthma, uncomplicated: Secondary | ICD-10-CM

## 2018-04-11 MED ORDER — BENRALIZUMAB 30 MG/ML ~~LOC~~ SOSY
30.0000 mg | PREFILLED_SYRINGE | SUBCUTANEOUS | Status: DC
  Administered 2018-04-11 – 2018-09-28 (×5): 30 mg via SUBCUTANEOUS

## 2018-04-11 NOTE — Progress Notes (Signed)
Immunotherapy   Patient Details  Name: Sharon Vazquez MRN: [id number] Date of Birth: [birth year]  04/11/2018  Patient started Fasenra 30 mg.  stopped. Frequency: 30 mg every 28 days x 3 doses and then 30 mg every 8 weeks. Epi-Pen: Patient does have current EpiPen and has been instructed on proper use. Consent signed and reviewed medication, side effects and schedule with patient.  Patient waited 30 minutes after injection and no local or systemic reactions.  Appointment for next injection made for 05/09/18 at 9:00 am.   Maree Erie 04/11/2018, 2:19 PM

## 2018-04-13 ENCOUNTER — Telehealth: Payer: Self-pay

## 2018-04-13 NOTE — Telephone Encounter (Signed)
Called and spoke with patient and informed her that we need her to sign [facility] form for her shipment of Fasenra. Patient stated she couldn't come in today 04/13/2018 due to being sleepy. She stated she would come in on Wednesday and sign it.

## 2018-04-16 NOTE — Patient Instructions (Signed)
PREETI WINEGARDNER  04/16/2018     @[email or web address] @   Your procedure is scheduled on  04/23/2018   Report to [facility] at  710   A.M.  Call this number if you have problems the morning of surgery:  [phone]   Remember:  Do not eat or drink after midnight.                       Take these medicines the morning of surgery with A SIP OF WATER  Xanax( if needed), voltaren, nexium, allegra, lamictil, metoprolol. Use your nebulizer and your inhalers before you come.    Do not wear jewelry, make-up or nail polish.  Do not wear lotions, powders, or perfumes, or deodorant.  Do not shave 48 hours prior to surgery.  Men may shave face and neck.  Do not bring valuables to the hospital.  [facility] is not responsible for any belongings or valuables.  Contacts, dentures or bridgework may not be worn into surgery.  Leave your suitcase in the car.  After surgery it may be brought to your room.  For patients admitted to the hospital, discharge time will be determined by your treatment team.  Patients discharged the day of surgery will not be allowed to drive home.   Name and phone number of your driver:   family Special instructions:  None  Please read over the following fact sheets that you were given. Anesthesia Post-op Instructions and Care and Recovery After Surgery      Surgical Procedures for Hemorrhoids, Care After Refer to this sheet in the next few weeks. These instructions provide you with information about caring for yourself after your procedure. Your health care provider may also give you more specific instructions. Your treatment has been planned according to current medical practices, but problems sometimes occur. Call your health care provider if you have any problems or questions after your procedure. What can I expect after the procedure? After the procedure, it is common to have:  Rectal pain.  Pain when you are having a bowel  movement.  Slight rectal bleeding.  Follow these instructions at home: Medicines  Take over-the-counter and prescription medicines only as told by your health care provider.  Do not drive or operate heavy machinery while taking prescription pain medicine.  Use a stool softener or a bulk laxative as told by your health care provider. Activity  Rest at home. Return to your normal activities as told by your health care provider.  Do not lift anything that is heavier than 10 lb (4.5 kg).  Do not sit for long periods of time. Take a walk every day or as told by your health care provider.  Do not strain to have a bowel movement. Do not spend a long time sitting on the toilet. Eating and drinking  Eat foods that contain fiber, such as whole grains, beans, nuts, fruits, and vegetables.  Drink enough fluid to keep your urine clear or pale yellow. General instructions  Sit in a warm bath 2-3 times per day to relieve soreness or itching.  Keep all follow-up visits as told by your health care provider. This is important. Contact a health care provider if:  Your pain medicine is not helping.  You have a fever or chills.  You become constipated.  You have trouble passing urine. Get help right away if:  You have very bad rectal pain.  You have heavy bleeding from your rectum. This information is not intended to replace advice given to you by your health care provider. Make sure you discuss any questions you have with your health care provider. Document Released: 08/13/2003 Document Revised: 10/29/2015 Document Reviewed: 08/18/2014 Elsevier Interactive Patient Education  2018 [employer].  Hemorrhoids Hemorrhoids are swollen veins in and around the rectum or anus. Hemorrhoids can cause pain, itching, or bleeding. Most of the time, they do not cause serious problems. They usually get better with diet changes, lifestyle changes, and other home treatments. Follow these instructions  at home: Eating and drinking  Eat foods that have fiber, such as whole grains, beans, nuts, fruits, and vegetables. Ask your doctor about taking products that have added fiber (fibersupplements).  Drink enough fluid to keep your pee (urine) clear or pale yellow. For Pain and Swelling  Take a warm-water bath (sitz bath) for 20 minutes to ease pain. Do this 3-4 times a day.  If directed, put ice on the painful area. It may be helpful to use ice between your warm baths. ? Put ice in a plastic bag. ? Place a towel between your skin and the bag. ? Leave the ice on for 20 minutes, 2-3 times a day. General instructions  Take over-the-counter and prescription medicines only as told by your doctor. ? Medicated creams and medicines that are inserted into the anus (suppositories) may be used or applied as told.  Exercise often.  Go to the bathroom when you have the urge to poop (to have a bowel movement). Do not wait.  Avoid pushing too hard (straining) when you poop.  Keep the butt area dry and clean. Use wet toilet paper or moist paper towels.  Do not sit on the toilet for a long time. Contact a doctor if:  You have any of these: ? Pain and swelling that do not get better with treatment or medicine. ? Bleeding that will not stop. ? Trouble pooping or you cannot poop. ? Pain or swelling outside the area of the hemorrhoids. This information is not intended to replace advice given to you by your health care provider. Make sure you discuss any questions you have with your health care provider. Document Released: 03/01/2008 Document Revised: 10/29/2015 Document Reviewed: 02/04/2015 Elsevier Interactive Patient Education  2018 [place] Anesthesia, Adult General anesthesia is the use of medicines to make a person "go to sleep" (be unconscious) for a medical procedure. General anesthesia is often recommended when a procedure:  Is long.  Requires you to be still or in an  unusual position.  Is major and can cause you to lose blood.  Is impossible to do without general anesthesia.  The medicines used for general anesthesia are called general anesthetics. In addition to making you sleep, the medicines:  Prevent pain.  Control your blood pressure.  Relax your muscles.  Tell a health care provider about:  Any allergies you have.  All medicines you are taking, including vitamins, herbs, eye drops, creams, and over-the-counter medicines.  Any problems you or family members have had with anesthetic medicines.  Types of anesthetics you have had in the past.  Any bleeding disorders you have.  Any surgeries you have had.  Any medical conditions you have.  Any history of heart or lung conditions, such as heart failure, sleep apnea, or chronic obstructive pulmonary disease (COPD).  Whether you are pregnant or may be pregnant.  Whether you use tobacco, alcohol, marijuana, or  street drugs.  Any history of Armed forces logistics/support/administrative officer.  Any history of depression or anxiety. What are the risks? Generally, this is a safe procedure. However, problems may occur, including:  Allergic reaction to anesthetics.  Lung and heart problems.  Inhaling food or liquids from your stomach into your lungs (aspiration).  Injury to nerves.  Waking up during your procedure and being unable to move (rare).  Extreme agitation or a state of mental confusion (delirium) when you wake up from the anesthetic.  Air in the bloodstream, which can lead to stroke.  These problems are more likely to develop if you are having a major surgery or if you have an advanced medical condition. You can prevent some of these complications by answering all of your health care provider's questions thoroughly and by following all pre-procedure instructions. General anesthesia can cause side effects, including:  Nausea or vomiting  A sore throat from the breathing tube.  Feeling cold or  shivery.  Feeling tired, washed out, or achy.  Sleepiness or drowsiness.  Confusion or agitation.  What happens before the procedure? Staying hydrated Follow instructions from your health care provider about hydration, which may include:  Up to 2 hours before the procedure - you may continue to drink clear liquids, such as water, clear fruit juice, black coffee, and plain tea.  Eating and drinking restrictions Follow instructions from your health care provider about eating and drinking, which may include:  8 hours before the procedure - stop eating heavy meals or foods such as meat, fried foods, or fatty foods.  6 hours before the procedure - stop eating light meals or foods, such as toast or cereal.  6 hours before the procedure - stop drinking milk or drinks that contain milk.  2 hours before the procedure - stop drinking clear liquids.  Medicines  Ask your health care provider about: ? Changing or stopping your regular medicines. This is especially important if you are taking diabetes medicines or blood thinners. ? Taking medicines such as aspirin and ibuprofen. These medicines can thin your blood. Do not take these medicines before your procedure if your health care provider instructs you not to. ? Taking new dietary supplements or medicines. Do not take these during the week before your procedure unless your health care provider approves them.  If you are told to take a medicine or to continue taking a medicine on the day of the procedure, take the medicine with sips of water. General instructions   Ask if you will be going home the same day, the following day, or after a longer hospital stay. ? Plan to have someone take you home. ? Plan to have someone stay with you for the first 24 hours after you leave the hospital or clinic.  For 3-6 weeks before the procedure, try not to use any tobacco products, such as cigarettes, chewing tobacco, and e-cigarettes.  You may brush  your teeth on the morning of the procedure, but make sure to spit out the toothpaste. What happens during the procedure?  You will be given anesthetics through a mask and through an IV tube in one of your veins.  You may receive medicine to help you relax (sedative).  As soon as you are asleep, a breathing tube may be used to help you breathe.  An anesthesia specialist will stay with you throughout the procedure. He or she will help keep you comfortable and safe by continuing to give you medicines and adjusting the amount of  medicine that you get. He or she will also watch your blood pressure, pulse, and oxygen levels to make sure that the anesthetics do not cause any problems.  If a breathing tube was used to help you breathe, it will be removed before you wake up. The procedure may vary among health care providers and hospitals. What happens after the procedure?  You will wake up, often slowly, after the procedure is complete, usually in a recovery area.  Your blood pressure, heart rate, breathing rate, and blood oxygen level will be monitored until the medicines you were given have worn off.  You may be given medicine to help you calm down if you feel anxious or agitated.  If you will be going home the same day, your health care provider may check to make sure you can stand, drink, and urinate.  Your health care providers will treat your pain and side effects before you go home.  Do not drive for 24 hours if you received a sedative.  You may: ? Feel nauseous and vomit. ? Have a sore throat. ? Have mental slowness. ? Feel cold or shivery. ? Feel sleepy. ? Feel tired. ? Feel sore or achy, even in parts of your body where you did not have surgery. This information is not intended to replace advice given to you by your health care provider. Make sure you discuss any questions you have with your health care provider. Document Released: 08/30/2007 Document Revised: 11/03/2015  Document Reviewed: 05/07/2015 Elsevier Interactive Patient Education  2018 [place] Anesthesia, Adult, Care After These instructions provide you with information about caring for yourself after your procedure. Your health care provider may also give you more specific instructions. Your treatment has been planned according to current medical practices, but problems sometimes occur. Call your health care provider if you have any problems or questions after your procedure. What can I expect after the procedure? After the procedure, it is common to have:  Vomiting.  A sore throat.  Mental slowness.  It is common to feel:  Nauseous.  Cold or shivery.  Sleepy.  Tired.  Sore or achy, even in parts of your body where you did not have surgery.  Follow these instructions at home: For at least 24 hours after the procedure:  Do not: ? Participate in activities where you could fall or become injured. ? Drive. ? Use heavy machinery. ? Drink alcohol. ? Take sleeping pills or medicines that cause drowsiness. ? Make important decisions or sign legal documents. ? Take care of children on your own.  Rest. Eating and drinking  If you vomit, drink water, juice, or soup when you can drink without vomiting.  Drink enough fluid to keep your urine clear or pale yellow.  Make sure you have little or no nausea before eating solid foods.  Follow the diet recommended by your health care provider. General instructions  Have a responsible adult stay with you until you are awake and alert.  Return to your normal activities as told by your health care provider. Ask your health care provider what activities are safe for you.  Take over-the-counter and prescription medicines only as told by your health care provider.  If you smoke, do not smoke without supervision.  Keep all follow-up visits as told by your health care provider. This is important. Contact a health care provider  if:  You continue to have nausea or vomiting at home, and medicines are not helpful.  You cannot drink  fluids or start eating again.  You cannot urinate after 8-12 hours.  You develop a skin rash.  You have fever.  You have increasing redness at the site of your procedure. Get help right away if:  You have difficulty breathing.  You have chest pain.  You have unexpected bleeding.  You feel that you are having a life-threatening or urgent problem. This information is not intended to replace advice given to you by your health care provider. Make sure you discuss any questions you have with your health care provider. Document Released: 08/29/2000 Document Revised: 10/26/2015 Document Reviewed: 05/07/2015 Elsevier Interactive Patient Education  [employer].

## 2018-04-19 ENCOUNTER — Other Ambulatory Visit: Payer: Self-pay

## 2018-04-19 ENCOUNTER — Encounter (HOSPITAL_COMMUNITY)
Admission: RE | Admit: 2018-04-19 | Discharge: 2018-04-19 | Disposition: A | Discharge status: Home / Self Care | Payer: Medicaid Other | Source: Ambulatory Visit | Attending: General Surgery | Admitting: General Surgery

## 2018-04-19 ENCOUNTER — Encounter (HOSPITAL_COMMUNITY): Payer: Self-pay

## 2018-04-19 DIAGNOSIS — Z01812 Encounter for preprocedural laboratory examination: Secondary | ICD-10-CM | POA: Insufficient documentation

## 2018-04-19 LAB — CBC WITH DIFFERENTIAL/PLATELET
Abs Immature Granulocytes: 0.02 10*3/uL (ref 0.00–0.07)
BASOS ABS: 0 10*3/uL (ref 0.0–0.1)
BASOS PCT: 0 %
EOS ABS: 0.2 10*3/uL (ref 0.0–0.5)
Eosinophils Relative: 2 %
HCT: 36.8 % (ref 36.0–46.0)
Hemoglobin: 12.6 g/dL (ref 12.0–15.0)
Immature Granulocytes: 0 %
Lymphocytes Relative: 36 %
Lymphs Abs: 3.1 10*3/uL (ref 0.7–4.0)
MCH: 30.4 pg (ref 26.0–34.0)
MCHC: 34.2 g/dL (ref 30.0–36.0)
MCV: 88.9 fL (ref 80.0–100.0)
Monocytes Absolute: 0.5 10*3/uL (ref 0.1–1.0)
Monocytes Relative: 6 %
Neutro Abs: 4.9 10*3/uL (ref 1.7–7.7)
Neutrophils Relative %: 56 %
PLATELETS: 368 10*3/uL (ref 150–400)
RBC: 4.14 MIL/uL (ref 3.87–5.11)
RDW: 13.9 % (ref 11.5–15.5)
WBC: 8.8 10*3/uL (ref 4.0–10.5)
nRBC: 0 % (ref 0.0–0.2)

## 2018-04-19 LAB — BASIC METABOLIC PANEL
Anion gap: 7 (ref 5–15)
BUN: 6 mg/dL (ref 6–20)
CALCIUM: 9 mg/dL (ref 8.9–10.3)
CHLORIDE: 104 mmol/L (ref 98–111)
CO2: 24 mmol/L (ref 22–32)
CREATININE: 0.82 mg/dL (ref 0.44–1.00)
GFR calc Af Amer: >60 mL/min (ref >60)
GFR calc non Af Amer: >60 mL/min (ref >60)
GLUCOSE: 97 mg/dL (ref 70–99)
Potassium: 3.5 mmol/L (ref 3.5–5.1)
Sodium: 135 mmol/L (ref 135–145)

## 2018-04-19 LAB — HCG, SERUM, QUALITATIVE: Preg, Serum: NEGATIVE

## 2018-04-20 ENCOUNTER — Encounter (HOSPITAL_COMMUNITY): Payer: Self-pay | Admitting: Emergency Medicine

## 2018-04-20 ENCOUNTER — Emergency Department (HOSPITAL_COMMUNITY): Payer: Medicaid Other

## 2018-04-20 ENCOUNTER — Emergency Department (HOSPITAL_COMMUNITY)
Admission: EM | Admit: 2018-04-20 | Discharge: 2018-04-20 | Disposition: A | Discharge status: Home / Self Care | Payer: Medicaid Other | Attending: Emergency Medicine | Admitting: Emergency Medicine

## 2018-04-20 ENCOUNTER — Other Ambulatory Visit: Payer: Self-pay

## 2018-04-20 DIAGNOSIS — S025XXA Fracture of tooth (traumatic), initial encounter for closed fracture: Secondary | ICD-10-CM | POA: Diagnosis not present

## 2018-04-20 DIAGNOSIS — Y929 Unspecified place or not applicable: Secondary | ICD-10-CM | POA: Diagnosis not present

## 2018-04-20 DIAGNOSIS — Y999 Unspecified external cause status: Secondary | ICD-10-CM | POA: Diagnosis not present

## 2018-04-20 DIAGNOSIS — Z87891 Personal history of nicotine dependence: Secondary | ICD-10-CM | POA: Insufficient documentation

## 2018-04-20 DIAGNOSIS — I10 Essential (primary) hypertension: Secondary | ICD-10-CM | POA: Diagnosis not present

## 2018-04-20 DIAGNOSIS — Z91040 Latex allergy status: Secondary | ICD-10-CM | POA: Diagnosis not present

## 2018-04-20 DIAGNOSIS — Z91010 Allergy to peanuts: Secondary | ICD-10-CM | POA: Insufficient documentation

## 2018-04-20 DIAGNOSIS — J45909 Unspecified asthma, uncomplicated: Secondary | ICD-10-CM | POA: Diagnosis not present

## 2018-04-20 DIAGNOSIS — Y939 Activity, unspecified: Secondary | ICD-10-CM | POA: Diagnosis not present

## 2018-04-20 DIAGNOSIS — S0990XA Unspecified injury of head, initial encounter: Secondary | ICD-10-CM | POA: Diagnosis present

## 2018-04-20 DIAGNOSIS — S0240FA Zygomatic fracture, left side, initial encounter for closed fracture: Secondary | ICD-10-CM

## 2018-04-20 DIAGNOSIS — Z79899 Other long term (current) drug therapy: Secondary | ICD-10-CM | POA: Diagnosis not present

## 2018-04-20 MED ORDER — IBUPROFEN 600 MG PO TABS: 600.0000 mg | ORAL_TABLET | Freq: Four times a day (QID) | ORAL | 0 refills | Status: DC | PRN

## 2018-04-20 MED ORDER — IBUPROFEN 800 MG PO TABS
800.0000 mg | ORAL_TABLET | Freq: Once | ORAL | Status: AC
  Administered 2018-04-20: 800 mg via ORAL
  Filled 2018-04-20: qty 1

## 2018-04-20 MED ORDER — ONDANSETRON HCL 4 MG PO TABS
8.0000 mg | ORAL_TABLET | Freq: Once | ORAL | Status: AC
  Administered 2018-04-20: 4 mg via ORAL
  Filled 2018-04-20: qty 2
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
## 2018-04-20 NOTE — ED Notes (Signed)
Patient transported to CT 

## 2018-04-20 NOTE — ED Notes (Signed)
Pt returned from CT °

## 2018-04-20 NOTE — Discharge Instructions (Addendum)
Follow up with the ENT doctor and dentist, take the medications as needed for pain, apply ice to help with the swelling

## 2018-04-20 NOTE — ED Notes (Signed)
Pt ambulatory to waiting room. Pt verbalized understanding of discharge instructions.   
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
## 2018-04-23 ENCOUNTER — Encounter (HOSPITAL_COMMUNITY)
Admission: RE | Disposition: A | Discharge status: Home / Self Care | Payer: Self-pay | Source: Ambulatory Visit | Attending: General Surgery

## 2018-04-23 ENCOUNTER — Ambulatory Visit (HOSPITAL_COMMUNITY): Payer: Medicaid Other | Admitting: Anesthesiology

## 2018-04-23 ENCOUNTER — Ambulatory Visit (HOSPITAL_COMMUNITY)
Admission: RE | Admit: 2018-04-23 | Discharge: 2018-04-23 | Disposition: A | Discharge status: Home / Self Care | Payer: Medicaid Other | Source: Ambulatory Visit | Attending: General Surgery | Admitting: General Surgery

## 2018-04-23 DIAGNOSIS — J45909 Unspecified asthma, uncomplicated: Secondary | ICD-10-CM | POA: Insufficient documentation

## 2018-04-23 DIAGNOSIS — F419 Anxiety disorder, unspecified: Secondary | ICD-10-CM | POA: Insufficient documentation

## 2018-04-23 DIAGNOSIS — G4700 Insomnia, unspecified: Secondary | ICD-10-CM | POA: Diagnosis not present

## 2018-04-23 DIAGNOSIS — F319 Bipolar disorder, unspecified: Secondary | ICD-10-CM | POA: Insufficient documentation

## 2018-04-23 DIAGNOSIS — M62830 Muscle spasm of back: Secondary | ICD-10-CM | POA: Diagnosis not present

## 2018-04-23 DIAGNOSIS — Z885 Allergy status to narcotic agent status: Secondary | ICD-10-CM | POA: Insufficient documentation

## 2018-04-23 DIAGNOSIS — K5900 Constipation, unspecified: Secondary | ICD-10-CM | POA: Insufficient documentation

## 2018-04-23 DIAGNOSIS — Z7951 Long term (current) use of inhaled steroids: Secondary | ICD-10-CM | POA: Diagnosis not present

## 2018-04-23 DIAGNOSIS — K644 Residual hemorrhoidal skin tags: Secondary | ICD-10-CM | POA: Insufficient documentation

## 2018-04-23 DIAGNOSIS — F17210 Nicotine dependence, cigarettes, uncomplicated: Secondary | ICD-10-CM | POA: Insufficient documentation

## 2018-04-23 DIAGNOSIS — Z79899 Other long term (current) drug therapy: Secondary | ICD-10-CM | POA: Diagnosis not present

## 2018-04-23 DIAGNOSIS — I10 Essential (primary) hypertension: Secondary | ICD-10-CM | POA: Diagnosis not present

## 2018-04-23 DIAGNOSIS — E669 Obesity, unspecified: Secondary | ICD-10-CM | POA: Diagnosis not present

## 2018-04-23 DIAGNOSIS — K648 Other hemorrhoids: Secondary | ICD-10-CM | POA: Insufficient documentation

## 2018-04-23 DIAGNOSIS — K219 Gastro-esophageal reflux disease without esophagitis: Secondary | ICD-10-CM | POA: Diagnosis not present

## 2018-04-23 DIAGNOSIS — G43909 Migraine, unspecified, not intractable, without status migrainosus: Secondary | ICD-10-CM | POA: Diagnosis not present

## 2018-04-23 HISTORY — PX: HEMORRHOID SURGERY: SHX153

## 2018-04-23 SURGERY — HEMORRHOIDECTOMY
Anesthesia: General

## 2018-04-23 MED ORDER — CHLORHEXIDINE GLUCONATE CLOTH 2 % EX PADS: 6.0000 | MEDICATED_PAD | Freq: Once | CUTANEOUS | Status: DC

## 2018-04-23 MED ORDER — FENTANYL CITRATE (PF) 100 MCG/2ML IJ SOLN
INTRAMUSCULAR | Status: AC
  Filled 2018-04-23: qty 2

## 2018-04-23 MED ORDER — MIDAZOLAM HCL 2 MG/2ML IJ SOLN: 0.5000 mg | Freq: Once | INTRAMUSCULAR | Status: DC | PRN

## 2018-04-23 MED ORDER — PROMETHAZINE HCL 25 MG/ML IJ SOLN: 6.2500 mg | INTRAMUSCULAR | Status: DC | PRN

## 2018-04-23 MED ORDER — BUPIVACAINE LIPOSOME 1.3 % IJ SUSP
INTRAMUSCULAR | Status: AC
  Filled 2018-04-23: qty 20

## 2018-04-23 MED ORDER — SODIUM CHLORIDE 0.9 % IV SOLN
2.0000 g | INTRAVENOUS | Status: AC
  Administered 2018-04-23: 2 g via INTRAVENOUS
  Filled 2018-04-23: qty 2

## 2018-04-23 MED ORDER — OXYCODONE-ACETAMINOPHEN 7.5-325 MG PO TABS: 1.0000 | ORAL_TABLET | Freq: Four times a day (QID) | ORAL | 0 refills | Status: DC | PRN

## 2018-04-23 MED ORDER — SODIUM CHLORIDE 0.9 % IR SOLN
Status: DC | PRN
  Administered 2018-04-23: 1000 mL

## 2018-04-23 MED ORDER — BUPIVACAINE LIPOSOME 1.3 % IJ SUSP
INTRAMUSCULAR | Status: DC | PRN
  Administered 2018-04-23: 20 mL

## 2018-04-23 MED ORDER — PROPOFOL 10 MG/ML IV BOLUS
INTRAVENOUS | Status: DC | PRN
  Administered 2018-04-23: 30 mg via INTRAVENOUS
  Administered 2018-04-23: 40 mg via INTRAVENOUS
  Administered 2018-04-23: 30 mg via INTRAVENOUS
  Administered 2018-04-23: 300 mg via INTRAVENOUS

## 2018-04-23 MED ORDER — ONDANSETRON HCL 4 MG/2ML IJ SOLN
INTRAMUSCULAR | Status: DC | PRN
  Administered 2018-04-23: 4 mg via INTRAVENOUS

## 2018-04-23 MED ORDER — MIDAZOLAM HCL 2 MG/2ML IJ SOLN
INTRAMUSCULAR | Status: DC | PRN
  Administered 2018-04-23: 2 mg via INTRAVENOUS

## 2018-04-23 MED ORDER — MIDAZOLAM HCL 2 MG/2ML IJ SOLN
INTRAMUSCULAR | Status: AC
  Filled 2018-04-23: qty 2

## 2018-04-23 MED ORDER — LIDOCAINE VISCOUS HCL 2 % MT SOLN
OROMUCOSAL | Status: DC | PRN
  Administered 2018-04-23: 20 mL via OROMUCOSAL

## 2018-04-23 MED ORDER — KETOROLAC TROMETHAMINE 30 MG/ML IJ SOLN
30.0000 mg | Freq: Once | INTRAMUSCULAR | Status: AC
  Administered 2018-04-23: 30 mg via INTRAVENOUS
  Filled 2018-04-23 (×2): qty 1

## 2018-04-23 MED ORDER — LIDOCAINE VISCOUS HCL 2 % MT SOLN
OROMUCOSAL | Status: AC
  Filled 2018-04-23: qty 15

## 2018-04-23 MED ORDER — LACTATED RINGERS IV SOLN
INTRAVENOUS | Status: DC
  Administered 2018-04-23: 1000 mL via INTRAVENOUS

## 2018-04-23 MED ORDER — FENTANYL CITRATE (PF) 100 MCG/2ML IJ SOLN
INTRAMUSCULAR | Status: DC | PRN
  Administered 2018-04-23: 50 ug via INTRAVENOUS
  Administered 2018-04-23: 100 ug via INTRAVENOUS
  Administered 2018-04-23: 50 ug via INTRAVENOUS

## 2018-04-23 SURGICAL SUPPLY — 27 items
CLOTH BEACON ORANGE TIMEOUT ST (SAFETY) ×2 IMPLANT
COVER LIGHT HANDLE STERIS (MISCELLANEOUS) ×4 IMPLANT
DRAPE HALF SHEET 40X57 (DRAPES) ×2 IMPLANT
DRAPE PROXIMA HALF (DRAPES) ×2 IMPLANT
ELECT REM PT RETURN 9FT ADLT (ELECTROSURGICAL) ×2
ELECTRODE REM PT RTRN 9FT ADLT (ELECTROSURGICAL) ×1 IMPLANT
GAUZE SPONGE 4X4 12PLY STRL (GAUZE/BANDAGES/DRESSINGS) ×3 IMPLANT
GLOVE BIOGEL PI IND STRL 7.0 (GLOVE) ×1 IMPLANT
GLOVE BIOGEL PI INDICATOR 7.0 (GLOVE) ×2
GLOVE SURG SS PI 6.5 STRL IVOR (GLOVE) ×1 IMPLANT
GLOVE SURG SS PI 7.5 STRL IVOR (GLOVE) ×2 IMPLANT
GOWN STRL REUS W/ TWL XL LVL3 (GOWN DISPOSABLE) ×1 IMPLANT
GOWN STRL REUS W/TWL LRG LVL3 (GOWN DISPOSABLE) ×2 IMPLANT
GOWN STRL REUS W/TWL XL LVL3 (GOWN DISPOSABLE) ×2
HEMOSTAT SURGICEL 4X8 (HEMOSTASIS) ×2 IMPLANT
KIT TURNOVER CYSTO (KITS) ×2 IMPLANT
LIGASURE IMPACT 36 18CM CVD LR (INSTRUMENTS) ×2 IMPLANT
MANIFOLD NEPTUNE II (INSTRUMENTS) ×2 IMPLANT
NEEDLE HYPO 22GX1.5 SAFETY (NEEDLE) ×2 IMPLANT
NS IRRIG 1000ML POUR BTL (IV SOLUTION) ×2 IMPLANT
PACK PERI GYN (CUSTOM PROCEDURE TRAY) ×2 IMPLANT
PAD ARMBOARD 7.5X6 YLW CONV (MISCELLANEOUS) ×2 IMPLANT
SET BASIN LINEN APH (SET/KITS/TRAYS/PACK) ×2 IMPLANT
SURGILUBE 3G PEEL PACK STRL (MISCELLANEOUS) ×2 IMPLANT
SUT SILK 0 FSL (SUTURE) ×2 IMPLANT
SUT VIC AB 2-0 CT2 27 (SUTURE) ×2 IMPLANT
SYR 20CC LL (SYRINGE) ×2 IMPLANT

## 2018-04-23 NOTE — Anesthesia Postprocedure Evaluation (Signed)
Anesthesia Post Note  Patient: Sharon Vazquez  Procedure(s) Performed: EXTENSIVE HEMORRHOIDECTOMY (N/A )  Patient location during evaluation: Short Stay Anesthesia Type: General Level of consciousness: awake and alert and patient cooperative Pain management: satisfactory to patient Vital Signs Assessment: post-procedure vital signs reviewed and stable Respiratory status: spontaneous breathing Cardiovascular status: stable Postop Assessment: no apparent nausea or vomiting Anesthetic complications: no     Last Vitals:  Vitals:   04/23/18 0900 04/23/18 0914  BP: (!) 141/95 (!) 144/95  Pulse: 76 71  Resp: 17 18  Temp:  37.1 C  SpO2: 96% 100%    Last Pain:  Vitals:   04/23/18 0914  TempSrc: Oral  PainSc: 4                  Daylan Boggess
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
## 2018-04-23 NOTE — Transfer of Care (Signed)
Immediate Anesthesia Transfer of Care Note  Patient: Sharon Vazquez  Procedure(s) Performed: EXTENSIVE HEMORRHOIDECTOMY (N/A )  Patient Location: PACU  Anesthesia Type:General  Level of Consciousness: awake, alert  and patient cooperative  Airway & Oxygen Therapy: Patient Spontanous Breathing  Post-op Assessment: Report given to RN and Post -op Vital signs reviewed and stable  Post vital signs: Reviewed and stable  Last Vitals:  Vitals Value Taken Time  BP    Temp    Pulse 81 04/23/2018  8:14 AM  Resp    SpO2 92 % 04/23/2018  8:14 AM  Vitals shown include unvalidated device data.  Last Pain:  Vitals:   04/23/18 0640  PainSc: 0-No pain         Complications: No apparent anesthesia complications

## 2018-04-23 NOTE — Interval H&P Note (Signed)
History and Physical Interval Note:  04/23/2018 7:19 AM  Sharon Vazquez  has presented today for surgery, with the diagnosis of bleeding hemorrhoids  The various methods of treatment have been discussed with the patient and family. After consideration of risks, benefits and other options for treatment, the patient has consented to  Procedure(s): EXTENSIVE HEMORRHOIDECTOMY (N/A) as a surgical intervention .  The patient's history has been reviewed, patient examined, no change in status, stable for surgery.  I have reviewed the patient's chart and labs.  Questions were answered to the patient's satisfaction.     Aviva Signs

## 2018-04-23 NOTE — Anesthesia Procedure Notes (Signed)
Procedure Name: LMA Insertion Date/Time: 04/23/2018 7:43 AM Performed by: Vista Deck, CRNA Pre-anesthesia Checklist: Patient identified, Patient being monitored, Emergency Drugs available, Timeout performed and Suction available Patient Re-evaluated:Patient Re-evaluated prior to induction Oxygen Delivery Method: Circle System Utilized Preoxygenation: Pre-oxygenation with 100% oxygen Induction Type: IV induction Ventilation: Mask ventilation without difficulty LMA: LMA inserted LMA Size: 4.0 Number of attempts: 1 Placement Confirmation: positive ETCO2 and breath sounds checked- equal and bilateral

## 2018-04-23 NOTE — Anesthesia Preprocedure Evaluation (Signed)
Anesthesia Evaluation  Patient identified by MRN, date of birth, ID band Patient awake    Reviewed: Allergy & Precautions, NPO status , Patient's Chart, lab work & pertinent test results, reviewed documented beta blocker date and time   Airway Mallampati: II  TM Distance: >3 FB Neck ROM: Full    Dental no notable dental hx. (+) Missing, Loose   Pulmonary shortness of breath, asthma , former smoker,    Pulmonary exam normal breath sounds clear to auscultation       Cardiovascular Exercise Tolerance: Good hypertension, Pt. on medications negative cardio ROS Normal cardiovascular examI Rhythm:Regular Rate:Normal     Neuro/Psych  Headaches, Anxiety Depression Bipolar Disorder S/p recent trauma  Seen in ED 2 days ago  Denies neck pain or LOC  Neuromuscular disease negative psych ROS   GI/Hepatic Neg liver ROS, GERD  Medicated and Controlled,  Endo/Other  negative endocrine ROS  Renal/GU negative Renal ROS  negative genitourinary   Musculoskeletal  (+) Arthritis , Osteoarthritis,    Abdominal   Peds negative pediatric ROS (+)  Hematology negative hematology ROS (+)   Anesthesia Other Findings   Reproductive/Obstetrics negative OB ROS                             Anesthesia Physical Anesthesia Plan  ASA: II  Anesthesia Plan: General   Post-op Pain Management:    Induction: Intravenous  PONV Risk Score and Plan:   Airway Management Planned: LMA  Additional Equipment:   Intra-op Plan:   Post-operative Plan: Extubation in OR  Informed Consent: I have reviewed the patients History and Physical, chart, labs and discussed the procedure including the risks, benefits and alternatives for the proposed anesthesia with the patient or authorized representative who has indicated his/her understanding and acceptance.   Dental advisory given  Plan Discussed with: CRNA  Anesthesia Plan  Comments: (LMA vs ETT)        Anesthesia Quick Evaluation

## 2018-04-23 NOTE — Op Note (Signed)
Patient:  Sharon Vazquez  DOB:  1981-05-31  MRN:  [id number]   Preop Diagnosis: Bleeding internal and external hemorrhoidal disease  Postop Diagnosis: Same  Procedure: Extensive internal and external hemorrhoidectomy  Surgeon: Aviva Signs, MD  Anes: General  Indications: Patient is a 37 year old white female who presents with bleeding internal and external hemorrhoids.  The risks and benefits of the procedure including bleeding, infection, and the possibility of recurrence of the hemorrhoidal disease were fully explained to the patient, who gave informed consent.  Procedure note: The patient was placed in the lithotomy position after general anesthesia was administered.  The perineum was prepped and draped using usual sterile technique with Betadine.  Surgical site confirmation was performed.  On rectal examination, the patient had prominent internal and external hemorrhoidal disease at the 4:00, 6:00, and 7:00 positions.  Other smaller prominent mucosal hemorrhoids were present circumferentially.  It was elected to proceed with hemorrhoidectomy of the most problematic hemorrhoids.  In a Ferguson collar-like fashion, a LigaSure was used to remove the internal hemorrhoids at the 4:00, 6:00, and 7:00 positions.  Care was taken to avoid the external sphincter mechanism.  No significant stricturing of the anus was noted at the end of the procedure.  Exparel was instilled into the surrounding wound.  The rectum was packed with Surgicel and Viscous Xylocaine packing.  All tape and needle counts were correct at the end of the procedure.  Patient was awakened and transferred to PACU in stable condition.  Complications: None  EBL: Minimal  Specimen: Hemorrhoids

## 2018-04-23 NOTE — Discharge Instructions (Signed)
Surgical Procedures for Hemorrhoids, Care After °Refer to this sheet in the next few weeks. These instructions provide you with information about caring for yourself after your procedure. Your health care provider may also give you more specific instructions. Your treatment has been planned according to current medical practices, but problems sometimes occur. Call your health care provider if you have any problems or questions after your procedure. °What can I expect after the procedure? °After the procedure, it is common to have: °· Rectal pain. °· Pain when you are having a bowel movement. °· Slight rectal bleeding. ° °Follow these instructions at home: °Medicines °· Take over-the-counter and prescription medicines only as told by your health care provider. °· Do not drive or operate heavy machinery while taking prescription pain medicine. °· Use a stool softener or a bulk laxative as told by your health care provider. °Activity °· Rest at home. Return to your normal activities as told by your health care provider. °· Do not lift anything that is heavier than 10 lb (4.5 kg). °· Do not sit for long periods of time. Take a walk every day or as told by your health care provider. °· Do not strain to have a bowel movement. Do not spend a long time sitting on the toilet. °Eating and drinking °· Eat foods that contain fiber, such as whole grains, beans, nuts, fruits, and vegetables. °· Drink enough fluid to keep your urine clear or pale yellow. °General instructions °· Sit in a warm bath 2-3 times per day to relieve soreness or itching. °· Keep all follow-up visits as told by your health care provider. This is important. °Contact a health care provider if: °· Your pain medicine is not helping. °· You have a fever or chills. °· You become constipated. °· You have trouble passing urine. °Get help right away if: °· You have very bad rectal pain. °· You have heavy bleeding from your rectum. °This information is not intended  to replace advice given to you by your health care provider. Make sure you discuss any questions you have with your health care provider. °Document Released: 08/13/2003 Document Revised: 10/29/2015 Document Reviewed: 08/18/2014 °Elsevier Interactive Patient Education © 2018 [employer] ° ° °General Anesthesia, Adult, Care After °These instructions provide you with information about caring for yourself after your procedure. Your health care provider may also give you more specific instructions. Your treatment has been planned according to current medical practices, but problems sometimes occur. Call your health care provider if you have any problems or questions after your procedure. °What can I expect after the procedure? °After the procedure, it is common to have: °· Vomiting. °· A sore throat. °· Mental slowness. ° °It is common to feel: °· Nauseous. °· Cold or shivery. °· Sleepy. °· Tired. °· Sore or achy, even in parts of your body where you did not have surgery. ° °Follow these instructions at home: °For at least 24 hours after the procedure: °· Do not: °? Participate in activities where you could fall or become injured. °? Drive. °? Use heavy machinery. °? Drink alcohol. °? Take sleeping pills or medicines that cause drowsiness. °? Make important decisions or sign legal documents. °? Take care of children on your own. °· Rest. °Eating and drinking °· If you vomit, drink water, juice, or soup when you can drink without vomiting. °· Drink enough fluid to keep your urine clear or pale yellow. °· Make sure you have little or no nausea before eating   solid foods. °· Follow the diet recommended by your health care provider. °General instructions °· Have a responsible adult stay with you until you are awake and alert. °· Return to your normal activities as told by your health care provider. Ask your health care provider what activities are safe for you. °· Take over-the-counter and prescription medicines only as  told by your health care provider. °· If you smoke, do not smoke without supervision. °· Keep all follow-up visits as told by your health care provider. This is important. °Contact a health care provider if: °· You continue to have nausea or vomiting at home, and medicines are not helpful. °· You cannot drink fluids or start eating again. °· You cannot urinate after 8-12 hours. °· You develop a skin rash. °· You have fever. °· You have increasing redness at the site of your procedure. °Get help right away if: °· You have difficulty breathing. °· You have chest pain. °· You have unexpected bleeding. °· You feel that you are having a life-threatening or urgent problem. °This information is not intended to replace advice given to you by your health care provider. Make sure you discuss any questions you have with your health care provider. °Document Released: 08/29/2000 Document Revised: 10/26/2015 Document Reviewed: 05/07/2015 °Elsevier Interactive Patient Education © 2018 [employer] ° °

## 2018-04-24 ENCOUNTER — Encounter (HOSPITAL_COMMUNITY): Payer: Self-pay | Admitting: General Surgery

## 2018-04-24 ENCOUNTER — Other Ambulatory Visit: Payer: Self-pay | Admitting: General Surgery

## 2018-04-24 MED ORDER — ONDANSETRON HCL 4 MG PO TABS: 4.0000 mg | ORAL_TABLET | Freq: Three times a day (TID) | ORAL | 0 refills | Status: DC | PRN

## 2018-04-25 ENCOUNTER — Encounter (HOSPITAL_BASED_OUTPATIENT_CLINIC_OR_DEPARTMENT_OTHER): Payer: Self-pay | Admitting: *Deleted

## 2018-04-25 ENCOUNTER — Other Ambulatory Visit: Payer: Self-pay

## 2018-04-27 ENCOUNTER — Ambulatory Visit (HOSPITAL_BASED_OUTPATIENT_CLINIC_OR_DEPARTMENT_OTHER): Payer: Medicaid Other | Admitting: Anesthesiology

## 2018-04-27 ENCOUNTER — Encounter (HOSPITAL_BASED_OUTPATIENT_CLINIC_OR_DEPARTMENT_OTHER): Payer: Self-pay

## 2018-04-27 ENCOUNTER — Ambulatory Visit (HOSPITAL_BASED_OUTPATIENT_CLINIC_OR_DEPARTMENT_OTHER)
Admission: RE | Admit: 2018-04-27 | Discharge: 2018-04-27 | Disposition: A | Discharge status: Home / Self Care | Payer: Medicaid Other | Source: Ambulatory Visit | Attending: Otolaryngology | Admitting: Otolaryngology

## 2018-04-27 ENCOUNTER — Other Ambulatory Visit: Payer: Self-pay

## 2018-04-27 ENCOUNTER — Encounter (HOSPITAL_BASED_OUTPATIENT_CLINIC_OR_DEPARTMENT_OTHER)
Admission: RE | Disposition: A | Discharge status: Home / Self Care | Payer: Self-pay | Source: Ambulatory Visit | Attending: Otolaryngology

## 2018-04-27 DIAGNOSIS — F909 Attention-deficit hyperactivity disorder, unspecified type: Secondary | ICD-10-CM | POA: Insufficient documentation

## 2018-04-27 DIAGNOSIS — M62830 Muscle spasm of back: Secondary | ICD-10-CM | POA: Insufficient documentation

## 2018-04-27 DIAGNOSIS — F419 Anxiety disorder, unspecified: Secondary | ICD-10-CM | POA: Diagnosis not present

## 2018-04-27 DIAGNOSIS — Z888 Allergy status to other drugs, medicaments and biological substances status: Secondary | ICD-10-CM | POA: Insufficient documentation

## 2018-04-27 DIAGNOSIS — I10 Essential (primary) hypertension: Secondary | ICD-10-CM | POA: Insufficient documentation

## 2018-04-27 DIAGNOSIS — M1990 Unspecified osteoarthritis, unspecified site: Secondary | ICD-10-CM | POA: Diagnosis not present

## 2018-04-27 DIAGNOSIS — F603 Borderline personality disorder: Secondary | ICD-10-CM | POA: Diagnosis not present

## 2018-04-27 DIAGNOSIS — J45909 Unspecified asthma, uncomplicated: Secondary | ICD-10-CM | POA: Diagnosis not present

## 2018-04-27 DIAGNOSIS — M549 Dorsalgia, unspecified: Secondary | ICD-10-CM | POA: Diagnosis not present

## 2018-04-27 DIAGNOSIS — K589 Irritable bowel syndrome without diarrhea: Secondary | ICD-10-CM | POA: Insufficient documentation

## 2018-04-27 DIAGNOSIS — R7303 Prediabetes: Secondary | ICD-10-CM | POA: Diagnosis not present

## 2018-04-27 DIAGNOSIS — Y92009 Unspecified place in unspecified non-institutional (private) residence as the place of occurrence of the external cause: Secondary | ICD-10-CM | POA: Insufficient documentation

## 2018-04-27 DIAGNOSIS — G4700 Insomnia, unspecified: Secondary | ICD-10-CM | POA: Diagnosis not present

## 2018-04-27 DIAGNOSIS — Z87891 Personal history of nicotine dependence: Secondary | ICD-10-CM | POA: Insufficient documentation

## 2018-04-27 DIAGNOSIS — K219 Gastro-esophageal reflux disease without esophagitis: Secondary | ICD-10-CM | POA: Insufficient documentation

## 2018-04-27 DIAGNOSIS — Z885 Allergy status to narcotic agent status: Secondary | ICD-10-CM | POA: Insufficient documentation

## 2018-04-27 DIAGNOSIS — F319 Bipolar disorder, unspecified: Secondary | ICD-10-CM | POA: Insufficient documentation

## 2018-04-27 DIAGNOSIS — Z791 Long term (current) use of non-steroidal anti-inflammatories (NSAID): Secondary | ICD-10-CM | POA: Insufficient documentation

## 2018-04-27 DIAGNOSIS — Z79899 Other long term (current) drug therapy: Secondary | ICD-10-CM | POA: Insufficient documentation

## 2018-04-27 DIAGNOSIS — S0240FA Zygomatic fracture, left side, initial encounter for closed fracture: Secondary | ICD-10-CM | POA: Insufficient documentation

## 2018-04-27 HISTORY — PX: ORIF ZYGOMATIC FRACTURE: SHX2134

## 2018-04-27 SURGERY — OPEN REDUCTION INTERNAL FIXATION (ORIF) ZYGOMATIC FRACTURE
Anesthesia: General | Laterality: Left

## 2018-04-27 MED ORDER — LIDOCAINE HCL (CARDIAC) PF 100 MG/5ML IV SOSY
PREFILLED_SYRINGE | INTRAVENOUS | Status: DC | PRN
  Administered 2018-04-27: 100 mg via INTRAVENOUS

## 2018-04-27 MED ORDER — ONDANSETRON HCL 4 MG/2ML IJ SOLN
INTRAMUSCULAR | Status: DC | PRN
  Administered 2018-04-27: 4 mg via INTRAVENOUS

## 2018-04-27 MED ORDER — OXYCODONE HCL 5 MG/5ML PO SOLN: 5.0000 mg | Freq: Once | ORAL | Status: DC | PRN

## 2018-04-27 MED ORDER — CEFAZOLIN SODIUM-DEXTROSE 2-3 GM-%(50ML) IV SOLR
INTRAVENOUS | Status: DC | PRN
  Administered 2018-04-27: 2 g via INTRAVENOUS

## 2018-04-27 MED ORDER — FENTANYL CITRATE (PF) 100 MCG/2ML IJ SOLN: 25.0000 ug | INTRAMUSCULAR | Status: DC | PRN

## 2018-04-27 MED ORDER — LACTATED RINGERS IV SOLN
INTRAVENOUS | Status: DC
  Administered 2018-04-27 (×2): via INTRAVENOUS

## 2018-04-27 MED ORDER — FENTANYL CITRATE (PF) 100 MCG/2ML IJ SOLN
INTRAMUSCULAR | Status: AC
  Filled 2018-04-27: qty 2

## 2018-04-27 MED ORDER — SUGAMMADEX SODIUM 500 MG/5ML IV SOLN
INTRAVENOUS | Status: AC
  Filled 2018-04-27: qty 5

## 2018-04-27 MED ORDER — MIDAZOLAM HCL 2 MG/2ML IJ SOLN: 1.0000 mg | INTRAMUSCULAR | Status: DC | PRN

## 2018-04-27 MED ORDER — ROCURONIUM BROMIDE 50 MG/5ML IV SOSY
PREFILLED_SYRINGE | INTRAVENOUS | Status: AC
  Filled 2018-04-27: qty 5

## 2018-04-27 MED ORDER — DEXAMETHASONE SODIUM PHOSPHATE 10 MG/ML IJ SOLN
INTRAMUSCULAR | Status: AC
  Filled 2018-04-27: qty 1

## 2018-04-27 MED ORDER — ONDANSETRON HCL 4 MG/2ML IJ SOLN
INTRAMUSCULAR | Status: AC
  Filled 2018-04-27: qty 2

## 2018-04-27 MED ORDER — SCOPOLAMINE 1 MG/3DAYS TD PT72: 1.0000 | MEDICATED_PATCH | Freq: Once | TRANSDERMAL | Status: DC | PRN

## 2018-04-27 MED ORDER — MEPERIDINE HCL 25 MG/ML IJ SOLN: 6.2500 mg | INTRAMUSCULAR | Status: DC | PRN

## 2018-04-27 MED ORDER — PROPOFOL 10 MG/ML IV BOLUS
INTRAVENOUS | Status: AC
  Filled 2018-04-27: qty 40

## 2018-04-27 MED ORDER — NEOMYCIN-POLYMYXIN-DEXAMETH 3.5-10000-0.1 OP OINT
TOPICAL_OINTMENT | OPHTHALMIC | Status: AC
  Filled 2018-04-27: qty 3.5

## 2018-04-27 MED ORDER — ACETAMINOPHEN 325 MG PO TABS: 325.0000 mg | ORAL_TABLET | ORAL | Status: DC | PRN

## 2018-04-27 MED ORDER — 0.9 % SODIUM CHLORIDE (POUR BTL) OPTIME
TOPICAL | Status: DC | PRN
  Administered 2018-04-27: 1000 mL

## 2018-04-27 MED ORDER — DEXAMETHASONE SODIUM PHOSPHATE 4 MG/ML IJ SOLN
INTRAMUSCULAR | Status: DC | PRN
  Administered 2018-04-27: 10 mg via INTRAVENOUS

## 2018-04-27 MED ORDER — DEXMEDETOMIDINE HCL IN NACL 200 MCG/50ML IV SOLN
INTRAVENOUS | Status: DC | PRN
  Administered 2018-04-27 (×2): 12 ug via INTRAVENOUS

## 2018-04-27 MED ORDER — ONDANSETRON HCL 4 MG/2ML IJ SOLN: 4.0000 mg | Freq: Once | INTRAMUSCULAR | Status: DC | PRN

## 2018-04-27 MED ORDER — LIDOCAINE-EPINEPHRINE 1 %-1:100000 IJ SOLN
INTRAMUSCULAR | Status: AC
  Filled 2018-04-27: qty 1

## 2018-04-27 MED ORDER — SUCCINYLCHOLINE CHLORIDE 20 MG/ML IJ SOLN
INTRAMUSCULAR | Status: DC | PRN
  Administered 2018-04-27: 200 mg via INTRAVENOUS

## 2018-04-27 MED ORDER — OXYCODONE HCL 5 MG PO TABS: 5.0000 mg | ORAL_TABLET | Freq: Once | ORAL | Status: DC | PRN

## 2018-04-27 MED ORDER — ROCURONIUM BROMIDE 100 MG/10ML IV SOLN
INTRAVENOUS | Status: DC | PRN
  Administered 2018-04-27: 40 mg via INTRAVENOUS

## 2018-04-27 MED ORDER — LIDOCAINE-EPINEPHRINE 1 %-1:100000 IJ SOLN
INTRAMUSCULAR | Status: DC | PRN
  Administered 2018-04-27: 4 mL

## 2018-04-27 MED ORDER — PROPOFOL 10 MG/ML IV BOLUS
INTRAVENOUS | Status: DC | PRN
  Administered 2018-04-27: 300 mg via INTRAVENOUS

## 2018-04-27 MED ORDER — ACETAMINOPHEN 160 MG/5ML PO SOLN: 325.0000 mg | ORAL | Status: DC | PRN

## 2018-04-27 MED ORDER — FENTANYL CITRATE (PF) 100 MCG/2ML IJ SOLN
50.0000 ug | INTRAMUSCULAR | Status: DC | PRN
  Administered 2018-04-27: 100 ug via INTRAVENOUS

## 2018-04-27 MED ORDER — BACITRACIN-NEOMYCIN-POLYMYXIN 400-5-5000 EX OINT
TOPICAL_OINTMENT | CUTANEOUS | Status: AC
  Filled 2018-04-27: qty 1

## 2018-04-27 MED ORDER — SUGAMMADEX SODIUM 500 MG/5ML IV SOLN
INTRAVENOUS | Status: DC | PRN
  Administered 2018-04-27: 400 mg via INTRAVENOUS

## 2018-04-27 MED ORDER — MIDAZOLAM HCL 2 MG/2ML IJ SOLN
INTRAMUSCULAR | Status: AC
  Filled 2018-04-27: qty 2

## 2018-04-27 MED ORDER — ONDANSETRON HCL 4 MG/2ML IJ SOLN
4.0000 mg | Freq: Once | INTRAMUSCULAR | Status: AC
  Administered 2018-04-27: 4 mg via INTRAVENOUS

## 2018-04-27 SURGICAL SUPPLY — 43 items
APL SRG 3 HI ABS STRL LF PLS (MISCELLANEOUS)
APPLICATOR DR MATTHEWS STRL (MISCELLANEOUS) IMPLANT
CANISTER SUCT 1200ML W/VALVE (MISCELLANEOUS) ×1 IMPLANT
CLEANER CAUTERY TIP 5X5 PAD (MISCELLANEOUS) IMPLANT
CORD BIPOLAR FORCEPS 12FT (ELECTRODE) IMPLANT
COVER WAND RF STERILE (DRAPES) IMPLANT
DECANTER SPIKE VIAL GLASS SM (MISCELLANEOUS) ×1 IMPLANT
DEPRESSOR TONGUE BLADE STERILE (MISCELLANEOUS) IMPLANT
ELECT NDL BLADE 2-5/6 (NEEDLE) ×1 IMPLANT
ELECT NEEDLE BLADE 2-5/6 (NEEDLE) IMPLANT
ELECT REM PT RETURN 9FT ADLT (ELECTROSURGICAL) ×2
ELECTRODE REM PT RTRN 9FT ADLT (ELECTROSURGICAL) ×1 IMPLANT
GLOVE ECLIPSE 8.0 STRL XLNG CF (GLOVE) ×2 IMPLANT
GOWN STRL REUS W/ TWL LRG LVL3 (GOWN DISPOSABLE) ×1 IMPLANT
GOWN STRL REUS W/ TWL XL LVL3 (GOWN DISPOSABLE) ×1 IMPLANT
GOWN STRL REUS W/TWL LRG LVL3 (GOWN DISPOSABLE) ×2
GOWN STRL REUS W/TWL XL LVL3 (GOWN DISPOSABLE) ×2
NDL BLUNT 17GA (NEEDLE) IMPLANT
NDL HYPO 25X1 1.5 SAFETY (NEEDLE) ×1 IMPLANT
NEEDLE BLUNT 17GA (NEEDLE) IMPLANT
NEEDLE HYPO 25X1 1.5 SAFETY (NEEDLE) ×2 IMPLANT
NS IRRIG 1000ML POUR BTL (IV SOLUTION) ×2 IMPLANT
PACK BASIN DAY SURGERY FS (CUSTOM PROCEDURE TRAY) ×2 IMPLANT
PACK ENT DAY SURGERY (CUSTOM PROCEDURE TRAY) ×2 IMPLANT
PAD CLEANER CAUTERY TIP 5X5 (MISCELLANEOUS)
PATTIES SURGICAL .5 X3 (DISPOSABLE) IMPLANT
PENCIL BUTTON HOLSTER BLD 10FT (ELECTRODE) ×2 IMPLANT
SHEILD EYE MED CORNL SHD 22X21 (OPHTHALMIC RELATED)
SHIELD EYE MED CORNL SHD 22X21 (OPHTHALMIC RELATED) IMPLANT
SLEEVE SCD COMPRESS KNEE MED (MISCELLANEOUS) ×1 IMPLANT
STAPLER VISISTAT 35W (STAPLE) ×2 IMPLANT
SUT CHROMIC 3 0 PS 2 (SUTURE) IMPLANT
SUT CHROMIC 4 0 P 3 18 (SUTURE) IMPLANT
SUT CHROMIC 4 0 PS 2 18 (SUTURE) IMPLANT
SUT ETHILON 4 0 CL P 3 (SUTURE) IMPLANT
SUT ETHILON 4 0 P 3 18 (SUTURE) IMPLANT
SUT ETHILON 5 0 P 3 18 (SUTURE)
SUT ETHILON 6 0 P 1 (SUTURE) IMPLANT
SUT NYLON ETHILON 5-0 P-3 1X18 (SUTURE) IMPLANT
SUT PLAIN 5 0 P 3 18 (SUTURE) IMPLANT
SYR BULB 3OZ (MISCELLANEOUS) ×1 IMPLANT
TOWEL GREEN STERILE FF (TOWEL DISPOSABLE) ×4 IMPLANT
TRAY DSU PREP LF (CUSTOM PROCEDURE TRAY) ×1 IMPLANT

## 2018-04-27 NOTE — Op Note (Signed)
DATE OF PROCEDURE:  04/27/2018    PRE-OPERATIVE DIAGNOSIS:  DEPRESSED LEFT ZYGOMATIC ARCH FRACTURE    POST-OPERATIVE DIAGNOSIS:  Same    PROCEDURE(S):  Open reduction of left zygomatic arch fracture   SURGEON:  Jodi Marble, MD    ASSISTANT(S):  Gavin Pound, MD    ANESTHESIA:  General endotracheal anesthesia      ESTIMATED BLOOD LOSS:  scant   SPECIMENS:  none    COMPLICATIONS:  None    OPERATIVE FINDINGS: The left zygomatic arch was depressed.  Reduction through transoral approach demonstrated a good restoration of the zygomatic arch.     OPERATIVE DETAILS: The patient was brought to the operating room and placed in the supine position.  General anesthesia was induced.  A timeout was performed.  The left gingivobuccal sulcus was injected with lidocaine 1% with 100,000 epinephrine.  Next a small vestibular incision was made.  A ureteral sound was then used to bluntly dissect onto the medial side of the zygomatic arch fracture.  This was then used to reduce fractures that the zygomatic arch was in its premorbid position.  Satisfactory cosmetic appearance was achieved.  All instrumentation was removed.  The wound bed was then irrigated copiously with normal saline.  The patient was returned to the care of the anesthesia staff, awakened, and transported to PACU in good condition.

## 2018-04-27 NOTE — Anesthesia Postprocedure Evaluation (Signed)
Anesthesia Post Note  Patient: MIKELE SIFUENTES  Procedure(s) Performed: OPEN REDUCTION ZYGOMATIC ARCH FRACTURE, TRANS ORAL (Left )     Patient location during evaluation: PACU Anesthesia Type: General Level of consciousness: awake and alert Pain management: pain level controlled Vital Signs Assessment: post-procedure vital signs reviewed and stable Respiratory status: spontaneous breathing, nonlabored ventilation, respiratory function stable and patient connected to nasal cannula oxygen Cardiovascular status: blood pressure returned to baseline and stable Postop Assessment: no apparent nausea or vomiting Anesthetic complications: no    Last Vitals:  Vitals:   04/27/18 1045 04/27/18 1100  BP: 117/79 114/79  Pulse: 79   Resp: 19   Temp:    SpO2: 96%     Last Pain:  Vitals:   04/27/18 1100  TempSrc:   PainSc: 0-No pain                 Sharnell Knight

## 2018-04-27 NOTE — Discharge Instructions (Addendum)
·   Ice packs to face for 15-minute intervals.  Tylenol alternate with ibuprofen as needed for pain control  Soft diet for 2 weeks  Avoid heavy lifting and strenuous activity for 2 weeks  Irrigate mouth with warm salt water 3 times daily after meals.   Post Anesthesia Home Care Instructions  Activity: Get plenty of rest for the remainder of the day. A responsible individual must stay with you for 24 hours following the procedure.  For the next 24 hours, DO NOT: -Drive a car -Paediatric nurse -Drink alcoholic beverages -Take any medication unless instructed by your physician -Make any legal decisions or sign important papers.  Meals: Start with liquid foods such as gelatin or soup. Progress to regular foods as tolerated. Avoid greasy, spicy, heavy foods. If nausea and/or vomiting occur, drink only clear liquids until the nausea and/or vomiting subsides. Call your physician if vomiting continues.  Special Instructions/Symptoms: Your throat may feel dry or sore from the anesthesia or the breathing tube placed in your throat during surgery. If this causes discomfort, gargle with warm salt water. The discomfort should disappear within 24 hours.  If you had a scopolamine patch placed behind your ear for the management of post- operative nausea and/or vomiting:  1. The medication in the patch is effective for 72 hours, after which it should be removed.  Wrap patch in a tissue and discard in the trash. Wash hands thoroughly with soap and water. 2. You may remove the patch earlier than 72 hours if you experience unpleasant side effects which may include dry mouth, dizziness or visual disturbances. 3. Avoid touching the patch. Wash your hands with soap and water after contact with the patch.

## 2018-04-27 NOTE — Transfer of Care (Signed)
Immediate Anesthesia Transfer of Care Note  Patient: Sharon Vazquez  Procedure(s) Performed: OPEN REDUCTION ZYGOMATIC ARCH FRACTURE, TRANS ORAL (Left )  Patient Location: PACU  Anesthesia Type:General  Level of Consciousness: awake, alert  and oriented  Airway & Oxygen Therapy: Patient Spontanous Breathing and Patient connected to face mask oxygen  Post-op Assessment: Report given to RN and Post -op Vital signs reviewed and stable  Post vital signs: Reviewed and stable  Last Vitals:  Vitals Value Taken Time  BP 112/56 04/27/2018 10:22 AM  Temp 37.1 C 04/27/2018 10:22 AM  Pulse 82 04/27/2018 10:24 AM  Resp 22 04/27/2018 10:24 AM  SpO2 97 % 04/27/2018 10:24 AM  Vitals shown include unvalidated device data.  Last Pain:  Vitals:   04/27/18 0820  TempSrc: Oral  PainSc: 4       Patients Stated Pain Goal: 4 (22/44/97 5300)  Complications: No apparent anesthesia complications

## 2018-04-27 NOTE — Anesthesia Preprocedure Evaluation (Signed)
Anesthesia Evaluation  Patient identified by MRN, date of birth, ID band Patient awake    Reviewed: Allergy & Precautions, NPO status , Patient's Chart, lab work & pertinent test results, reviewed documented beta blocker date and time   Airway Mallampati: II  TM Distance: >3 FB Neck ROM: Full    Dental no notable dental hx. (+) Missing, Loose   Pulmonary shortness of breath, asthma , former smoker,    Pulmonary exam normal breath sounds clear to auscultation       Cardiovascular Exercise Tolerance: Good hypertension, Pt. on medications negative cardio ROS Normal cardiovascular examI Rhythm:Regular Rate:Normal     Neuro/Psych  Headaches, PSYCHIATRIC DISORDERS Anxiety Depression Bipolar Disorder S/p recent trauma  Seen in ED 2 days ago  Denies neck pain or LOC  Neuromuscular disease    GI/Hepatic Neg liver ROS, GERD  Medicated and Controlled,  Endo/Other  negative endocrine ROS  Renal/GU negative Renal ROS  negative genitourinary   Musculoskeletal  (+) Arthritis , Osteoarthritis,    Abdominal   Peds negative pediatric ROS (+)  Hematology negative hematology ROS (+)   Anesthesia Other Findings   Reproductive/Obstetrics negative OB ROS                             Anesthesia Physical  Anesthesia Plan  ASA: III  Anesthesia Plan: General   Post-op Pain Management:    Induction: Intravenous  PONV Risk Score and Plan: 3 and Ondansetron, Dexamethasone, Treatment may vary due to age or medical condition and Midazolam  Airway Management Planned: LMA and Oral ETT  Additional Equipment:   Intra-op Plan:   Post-operative Plan: Extubation in OR  Informed Consent: I have reviewed the patients History and Physical, chart, labs and discussed the procedure including the risks, benefits and alternatives for the proposed anesthesia with the patient or authorized representative who has  indicated his/her understanding and acceptance.   Dental advisory given  Plan Discussed with: CRNA, Anesthesiologist and Surgeon  Anesthesia Plan Comments: (LMA vs ETT)       Anesthesia Quick Evaluation
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
## 2018-04-30 ENCOUNTER — Encounter (HOSPITAL_BASED_OUTPATIENT_CLINIC_OR_DEPARTMENT_OTHER): Payer: Self-pay | Admitting: Otolaryngology

## 2018-05-01 ENCOUNTER — Ambulatory Visit (INDEPENDENT_AMBULATORY_CARE_PROVIDER_SITE_OTHER): Payer: Self-pay | Admitting: General Surgery

## 2018-05-01 ENCOUNTER — Encounter: Payer: Self-pay | Admitting: General Surgery

## 2018-05-01 VITALS — BP 123/77 | HR 77 | Temp 98.9°F | Resp 16 | Wt 213.0 lb

## 2018-05-01 DIAGNOSIS — Z09 Encounter for follow-up examination after completed treatment for conditions other than malignant neoplasm: Secondary | ICD-10-CM

## 2018-05-01 MED ORDER — OXYCODONE-ACETAMINOPHEN 7.5-325 MG PO TABS: 1.0000 | ORAL_TABLET | Freq: Four times a day (QID) | ORAL | 0 refills | Status: DC | PRN

## 2018-05-01 NOTE — Progress Notes (Signed)
Subjective:     Sharon Vazquez  Status post extensive hemorrhoidectomy.  Still having moderate rectal pain.  Limited blood per rectum.  Is trying fiber supplements. Objective:    BP 123/77 (BP Location: Left Arm, Patient Position: Sitting, Cuff Size: Large)   Pulse 77   Temp 98.9 F (37.2 C)   Resp 16   Wt 213 lb (96.6 kg)   LMP 04/09/2018 (Exact Date)   BMI 34.38 kg/m   General:  alert, cooperative and no distress  Rectal examination shows hemorrhoidectomy healing well.  No active bleeding noted. Final pathology consistent with diagnosis.     Assessment:    Doing well postoperatively. Incisional pain within normal limits for hemorrhoidectomy    Plan:   Have reordered Percocet for pain.  Continue stool softeners and warm tub baths as needed.  Follow-up here in 2 weeks.

## 2018-05-09 ENCOUNTER — Ambulatory Visit (INDEPENDENT_AMBULATORY_CARE_PROVIDER_SITE_OTHER): Payer: Medicaid Other

## 2018-05-09 DIAGNOSIS — J4550 Severe persistent asthma, uncomplicated: Secondary | ICD-10-CM

## 2018-05-11 DIAGNOSIS — S0240FA Zygomatic fracture, left side, initial encounter for closed fracture: Secondary | ICD-10-CM | POA: Insufficient documentation

## 2018-05-14 ENCOUNTER — Telehealth (INDEPENDENT_AMBULATORY_CARE_PROVIDER_SITE_OTHER): Payer: Self-pay | Admitting: Internal Medicine

## 2018-05-14 NOTE — Telephone Encounter (Signed)
Patient came by to pick up some Trulance - we didn't have any - patient wanted to know if she could be prescribed this medication - ph# [phone]

## 2018-05-15 ENCOUNTER — Encounter: Payer: Self-pay | Admitting: General Surgery

## 2018-05-15 ENCOUNTER — Ambulatory Visit (INDEPENDENT_AMBULATORY_CARE_PROVIDER_SITE_OTHER): Payer: Self-pay | Admitting: General Surgery

## 2018-05-15 VITALS — BP 159/99 | HR 74 | Temp 96.8°F | Resp 18 | Wt 203.2 lb

## 2018-05-15 DIAGNOSIS — Z09 Encounter for follow-up examination after completed treatment for conditions other than malignant neoplasm: Secondary | ICD-10-CM

## 2018-05-15 MED ORDER — IBUPROFEN 600 MG PO TABS: 600.0000 mg | ORAL_TABLET | Freq: Four times a day (QID) | ORAL | 1 refills | Status: DC | PRN

## 2018-05-15 NOTE — Addendum Note (Signed)
Addended by: Aviva Signs A on: 05/15/2018 01:32 PM   Modules accepted: Orders

## 2018-05-15 NOTE — Progress Notes (Signed)
Subjective:     Sharon Vazquez  Here for follow-up.  She states she only has intermittent episodes of blood per rectum.  She still gets constipated and only moves her bowels once a week. Objective:    BP (!) 159/99 (BP Location: Left Arm, Patient Position: Sitting, Cuff Size: Normal)   Pulse 74   Temp (!) 96.8 F (36 C) (Temporal)   Resp 18   Wt 203 lb 3.2 oz (92.2 kg)   BMI 32.80 kg/m   General:  alert, cooperative and no distress  Rectal incisions healing well.  No active bleeding noted.     Assessment:    Doing well postoperatively.    Plan:   Avoid constipation.  May use medicated wipes, but she should pat herself dry.  Follow-up as needed.

## 2018-05-20 ENCOUNTER — Other Ambulatory Visit (INDEPENDENT_AMBULATORY_CARE_PROVIDER_SITE_OTHER): Payer: Self-pay | Admitting: Internal Medicine

## 2018-05-20 MED ORDER — PLECANATIDE 3 MG PO TABS: 3.0000 mg | ORAL_TABLET | Freq: Every day | ORAL | 5 refills | Status: DC | PRN

## 2018-05-20 NOTE — Telephone Encounter (Signed)
Please call patient and let her know the medication sent to [facility].  Sharon Vazquez, please send these messages to [name]

## 2018-05-22 ENCOUNTER — Telehealth: Payer: Self-pay | Admitting: *Deleted

## 2018-05-22 NOTE — Telephone Encounter (Signed)
No period yet, HPT negative, has breast pain and pelvic pain, go to ER if desired, but I would wait to see if periods starts,wear good bra and tylenol if desire.

## 2018-06-08 ENCOUNTER — Ambulatory Visit (INDEPENDENT_AMBULATORY_CARE_PROVIDER_SITE_OTHER): Payer: Medicaid Other

## 2018-06-08 DIAGNOSIS — J4550 Severe persistent asthma, uncomplicated: Secondary | ICD-10-CM

## 2018-06-13 ENCOUNTER — Other Ambulatory Visit: Payer: Self-pay | Admitting: Allergy & Immunology

## 2018-06-19 DIAGNOSIS — N644 Mastodynia: Secondary | ICD-10-CM | POA: Insufficient documentation

## 2018-07-02 ENCOUNTER — Other Ambulatory Visit: Payer: Self-pay | Admitting: Family Medicine

## 2018-07-02 DIAGNOSIS — N6452 Nipple discharge: Secondary | ICD-10-CM

## 2018-07-04 ENCOUNTER — Ambulatory Visit: Payer: Medicaid Other | Admitting: Podiatry

## 2018-07-04 ENCOUNTER — Encounter: Payer: Self-pay | Admitting: Podiatry

## 2018-07-04 VITALS — BP 108/59 | HR 83

## 2018-07-04 DIAGNOSIS — L600 Ingrowing nail: Secondary | ICD-10-CM

## 2018-07-04 MED ORDER — NEOMYCIN-POLYMYXIN-HC 3.5-10000-1 OT SOLN: OTIC | 0 refills | Status: DC

## 2018-07-04 NOTE — Patient Instructions (Signed)

## 2018-07-05 NOTE — Progress Notes (Signed)
Subjective:   Patient ID: Sharon Vazquez, female   DOB: 38 y.o.   MRN: [id number]   HPI Patient presents with very painful ingrown toenail right over left big toe.  States is both borders and its increasingly difficult to wear shoes and she is tried to trim and soak it without relief of symptoms.  Patient states is been present for a long time and is worsened recently.  Patient does not smoke and likes to be active   Review of Systems  All other systems reviewed and are negative.       Objective:  Physical Exam Vitals signs and nursing note reviewed.  Constitutional:      Appearance: She is well-developed.  Pulmonary:     Effort: Pulmonary effort is normal.  Musculoskeletal: Normal range of motion.  Skin:    General: Skin is warm.  Neurological:     Mental Status: She is alert.     Neurovascular status intact muscle strength is adequate range of motion within normal limits with patient found to have incurvated hallux nail right over left with both medial lateral borders of all and no drainage or redness noted but pain with palpation to the borders.  The left is mildly tender but not to the same degree and patient was noted to have good digital perfusion and is well oriented x3     Assessment:  Acute ingrown toenail deformity hallux right foot medial lateral borders with pain     Plan:  H&P condition reviewed and recommended correction.  Explained procedure risk and patient wants surgery and today I went ahead and had her sign consent form understanding risk.  I infiltrated the right hallux 60 mg like Marcaine mixture sterile prep applied and using sterile instrumentation I remove the medial lateral border exposed matrix and applied phenol 3 applications 30 seconds followed by alcohol lavage to each border and applied sterile dressing and instructed on soaks and to leave the dressing on 24 hours but to take it off earlier if throbbing were to occur.  Wrote prescription for  antibiotic drops to use after soaks and encouraged to call with any questions concerns she may have

## 2018-07-06 ENCOUNTER — Ambulatory Visit: Payer: Medicaid Other | Admitting: Podiatry

## 2018-07-07 ENCOUNTER — Other Ambulatory Visit: Payer: Self-pay

## 2018-07-07 ENCOUNTER — Encounter (HOSPITAL_COMMUNITY): Payer: Self-pay | Admitting: Emergency Medicine

## 2018-07-07 ENCOUNTER — Emergency Department (HOSPITAL_COMMUNITY): Payer: Medicaid Other

## 2018-07-07 ENCOUNTER — Emergency Department (HOSPITAL_COMMUNITY)
Admission: EM | Admit: 2018-07-07 | Discharge: 2018-07-07 | Disposition: A | Discharge status: Home / Self Care | Payer: Medicaid Other | Attending: Emergency Medicine | Admitting: Emergency Medicine

## 2018-07-07 DIAGNOSIS — E876 Hypokalemia: Secondary | ICD-10-CM | POA: Diagnosis not present

## 2018-07-07 DIAGNOSIS — Z91010 Allergy to peanuts: Secondary | ICD-10-CM | POA: Diagnosis not present

## 2018-07-07 DIAGNOSIS — R072 Precordial pain: Secondary | ICD-10-CM | POA: Insufficient documentation

## 2018-07-07 DIAGNOSIS — Z91040 Latex allergy status: Secondary | ICD-10-CM | POA: Insufficient documentation

## 2018-07-07 DIAGNOSIS — I10 Essential (primary) hypertension: Secondary | ICD-10-CM | POA: Diagnosis not present

## 2018-07-07 DIAGNOSIS — R55 Syncope and collapse: Secondary | ICD-10-CM | POA: Diagnosis not present

## 2018-07-07 DIAGNOSIS — J45909 Unspecified asthma, uncomplicated: Secondary | ICD-10-CM | POA: Diagnosis not present

## 2018-07-07 DIAGNOSIS — Z87891 Personal history of nicotine dependence: Secondary | ICD-10-CM | POA: Insufficient documentation

## 2018-07-07 LAB — BASIC METABOLIC PANEL
Anion gap: 10 (ref 5–15)
BUN: 7 mg/dL (ref 6–20)
CALCIUM: 9.3 mg/dL (ref 8.9–10.3)
CO2: 22 mmol/L (ref 22–32)
Chloride: 108 mmol/L (ref 98–111)
Creatinine, Ser: 0.94 mg/dL (ref 0.44–1.00)
GFR calc Af Amer: >60 mL/min (ref >60)
GFR calc non Af Amer: >60 mL/min (ref >60)
Glucose, Bld: 97 mg/dL (ref 70–99)
Potassium: 2.8 mmol/L — ABNORMAL LOW (ref 3.5–5.1)
Sodium: 140 mmol/L (ref 135–145)

## 2018-07-07 LAB — CBC
HCT: 40.1 % (ref 36.0–46.0)
Hemoglobin: 13.8 g/dL (ref 12.0–15.0)
MCH: 31.2 pg (ref 26.0–34.0)
MCHC: 34.4 g/dL (ref 30.0–36.0)
MCV: 90.7 fL (ref 80.0–100.0)
Platelets: 406 10*3/uL — ABNORMAL HIGH (ref 150–400)
RBC: 4.42 MIL/uL (ref 3.87–5.11)
RDW: 13.9 % (ref 11.5–15.5)
WBC: 13.1 10*3/uL — ABNORMAL HIGH (ref 4.0–10.5)
nRBC: 0 % (ref 0.0–0.2)

## 2018-07-07 LAB — HEPATIC FUNCTION PANEL
ALT: 14 U/L (ref 0–44)
AST: 19 U/L (ref 15–41)
Albumin: 4.4 g/dL (ref 3.5–5.0)
Alkaline Phosphatase: 48 U/L (ref 38–126)
BILIRUBIN DIRECT: 0.1 mg/dL (ref 0.0–0.2)
Indirect Bilirubin: 0.2 mg/dL — ABNORMAL LOW (ref 0.3–0.9)
Total Bilirubin: 0.3 mg/dL (ref 0.3–1.2)
Total Protein: 7.2 g/dL (ref 6.5–8.1)

## 2018-07-07 LAB — TROPONIN I
Troponin I: <0.03 ng/mL (ref <0.03)
Troponin I: <0.03 ng/mL (ref <0.03)

## 2018-07-07 LAB — D-DIMER, QUANTITATIVE: D-Dimer, Quant: 0.48 ug/mL-FEU (ref 0.00–0.50)

## 2018-07-07 LAB — I-STAT BETA HCG BLOOD, ED (MC, WL, AP ONLY): I-stat hCG, quantitative: <5 m[IU]/mL (ref <5)

## 2018-07-07 LAB — CBG MONITORING, ED: Glucose-Capillary: 85 mg/dL (ref 70–99)

## 2018-07-07 LAB — LIPASE, BLOOD: Lipase: 24 U/L (ref 11–51)

## 2018-07-07 MED ORDER — POTASSIUM CHLORIDE 10 MEQ/100ML IV SOLN
10.0000 meq | INTRAVENOUS | Status: AC
  Administered 2018-07-07 (×3): 10 meq via INTRAVENOUS
  Filled 2018-07-07 (×3): qty 100

## 2018-07-07 MED ORDER — KETOROLAC TROMETHAMINE 30 MG/ML IJ SOLN
30.0000 mg | Freq: Once | INTRAMUSCULAR | Status: AC
  Administered 2018-07-07: 30 mg via INTRAVENOUS
  Filled 2018-07-07: qty 1

## 2018-07-07 MED ORDER — POTASSIUM CHLORIDE CRYS ER 20 MEQ PO TBCR: 40.0000 meq | EXTENDED_RELEASE_TABLET | Freq: Every day | ORAL | 0 refills | Status: DC

## 2018-07-07 MED ORDER — SODIUM CHLORIDE 0.9 % IV BOLUS
1000.0000 mL | Freq: Once | INTRAVENOUS | Status: AC
  Administered 2018-07-07: 1000 mL via INTRAVENOUS

## 2018-07-07 MED ORDER — ALPRAZOLAM 0.5 MG PO TABS
1.0000 mg | ORAL_TABLET | Freq: Once | ORAL | Status: AC
  Administered 2018-07-07: 1 mg via ORAL
  Filled 2018-07-07: qty 2

## 2018-07-07 MED ORDER — POTASSIUM CHLORIDE CRYS ER 20 MEQ PO TBCR
40.0000 meq | EXTENDED_RELEASE_TABLET | Freq: Once | ORAL | Status: AC
  Administered 2018-07-07: 40 meq via ORAL
  Filled 2018-07-07: qty 2

## 2018-07-07 NOTE — Discharge Instructions (Signed)
You have been seen today in the Emergency Department (ED)  for syncope (passing out).  Your workup including labs and EKG show reassuring results.  Your symptoms may be due to dehydration, so it is important that you drink plenty of non-alcoholic fluids.  You need to take the potassium prescribed and call your PCP to schedule a follow up appointment for repeat blood work. Call the Cardiologist as well to schedule an appointment.   Please call your regular doctor as soon as possible to schedule the next available clinic appointment to follow up with him/her regarding your visit to the ED and your symptoms.  Return to the Emergency Department (ED)  if you have any further syncopal episodes (pass out again) or develop ANY chest pain, pressure, tightness, trouble breathing, sudden sweating, or other symptoms that concern you.
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
## 2018-07-07 NOTE — ED Notes (Signed)
CRITICAL VALUE ALERT  Critical Value:  Potassium 2.8  Date & Time Notied: 07/07/2018 @ 49   Provider Notified: Dr Laverta Baltimore  Orders Received/Actions taken: orders to be given

## 2018-07-07 NOTE — ED Triage Notes (Signed)
Pt brought in for a syncopal episode that occurred about 5 min ago. Family states that right before the syncopal episode, pt was c/o CP and SOB. Pt reports mid chest tightness. Pt pale and drowsy.

## 2018-07-09 DIAGNOSIS — R55 Syncope and collapse: Secondary | ICD-10-CM | POA: Insufficient documentation

## 2018-07-09 DIAGNOSIS — R0789 Other chest pain: Secondary | ICD-10-CM | POA: Insufficient documentation

## 2018-07-09 DIAGNOSIS — E876 Hypokalemia: Secondary | ICD-10-CM | POA: Insufficient documentation

## 2018-07-10 ENCOUNTER — Encounter (HOSPITAL_COMMUNITY): Payer: Self-pay | Admitting: Emergency Medicine

## 2018-07-10 ENCOUNTER — Emergency Department (HOSPITAL_COMMUNITY)
Admission: EM | Admit: 2018-07-10 | Discharge: 2018-07-10 | Disposition: A | Discharge status: Home / Self Care | Payer: Medicaid Other | Attending: Emergency Medicine | Admitting: Emergency Medicine

## 2018-07-10 ENCOUNTER — Other Ambulatory Visit: Payer: Self-pay

## 2018-07-10 DIAGNOSIS — Z87891 Personal history of nicotine dependence: Secondary | ICD-10-CM | POA: Insufficient documentation

## 2018-07-10 DIAGNOSIS — J45909 Unspecified asthma, uncomplicated: Secondary | ICD-10-CM | POA: Diagnosis not present

## 2018-07-10 DIAGNOSIS — I10 Essential (primary) hypertension: Secondary | ICD-10-CM | POA: Insufficient documentation

## 2018-07-10 DIAGNOSIS — F909 Attention-deficit hyperactivity disorder, unspecified type: Secondary | ICD-10-CM | POA: Diagnosis not present

## 2018-07-10 DIAGNOSIS — Z91040 Latex allergy status: Secondary | ICD-10-CM | POA: Diagnosis not present

## 2018-07-10 DIAGNOSIS — Z79899 Other long term (current) drug therapy: Secondary | ICD-10-CM | POA: Diagnosis not present

## 2018-07-10 DIAGNOSIS — Z91010 Allergy to peanuts: Secondary | ICD-10-CM | POA: Diagnosis not present

## 2018-07-10 LAB — BASIC METABOLIC PANEL
Anion gap: 9 (ref 5–15)
BUN: 6 mg/dL (ref 6–20)
CO2: 25 mmol/L (ref 22–32)
Calcium: 9.3 mg/dL (ref 8.9–10.3)
Chloride: 104 mmol/L (ref 98–111)
Creatinine, Ser: 0.78 mg/dL (ref 0.44–1.00)
GFR calc Af Amer: >60 mL/min (ref >60)
GFR calc non Af Amer: >60 mL/min (ref >60)
Glucose, Bld: 87 mg/dL (ref 70–99)
Potassium: 3.6 mmol/L (ref 3.5–5.1)
Sodium: 138 mmol/L (ref 135–145)

## 2018-07-10 LAB — I-STAT BETA HCG BLOOD, ED (MC, WL, AP ONLY): I-stat hCG, quantitative: <5 m[IU]/mL (ref <5)

## 2018-07-10 MED ORDER — KETOROLAC TROMETHAMINE 30 MG/ML IJ SOLN
15.0000 mg | Freq: Once | INTRAMUSCULAR | Status: AC
  Administered 2018-07-10: 15 mg via INTRAMUSCULAR
  Filled 2018-07-10: qty 1

## 2018-07-10 MED ORDER — LORAZEPAM 1 MG PO TABS
1.0000 mg | ORAL_TABLET | Freq: Once | ORAL | Status: AC
  Administered 2018-07-10: 1 mg via ORAL
  Filled 2018-07-10: qty 1

## 2018-07-10 NOTE — ED Notes (Signed)
Up to BR, gait steady 

## 2018-07-10 NOTE — ED Triage Notes (Signed)
BP was 142/101 at 0745 this morning.  Lightheaded dizzy and cant stop shaking
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
## 2018-07-26 ENCOUNTER — Other Ambulatory Visit: Payer: Self-pay

## 2018-07-27 ENCOUNTER — Other Ambulatory Visit: Payer: Self-pay

## 2018-08-02 ENCOUNTER — Encounter: Payer: Self-pay | Admitting: Podiatry

## 2018-08-02 ENCOUNTER — Ambulatory Visit: Payer: Medicaid Other | Admitting: Podiatry

## 2018-08-02 DIAGNOSIS — L600 Ingrowing nail: Secondary | ICD-10-CM

## 2018-08-02 DIAGNOSIS — M2041 Other hammer toe(s) (acquired), right foot: Secondary | ICD-10-CM

## 2018-08-02 NOTE — Patient Instructions (Signed)

## 2018-08-02 NOTE — Progress Notes (Signed)
Subjective:   Patient ID: Sharon Vazquez, female   DOB: 38 y.o.   MRN: [id number]   HPI Patient states she is getting painful ingrown toenail of the left big toe and also is concerned about hammertoe deformity second right with keratotic lesion that can be painful   ROS      Objective:  Physical Exam  Patient is found to have incurvated nailbeds left hallux that are painful with history of having had the right fixed and keratotic lesion digit to right that is moderately painful     Assessment:  Ingrown toenail x2 left along with hammertoe deformity second right     Plan:  H&P both conditions discussed reviewed and patient had numerous questions and today I recommend removal of the nail corners left explaining procedure risk and patient read consent form signed and today infiltrated 60 mg like Marcaine mixture sterile prep applied to the toe and using sterile instruments removed medial lateral border exposed matrix applied phenol 3 applications 30 seconds followed by alcohol lavage and sterile dressing and gave instructions on soaks and to leave the dressing on 24 hours but to take it off earlier if symptoms should occur.  For the right I discussed hammertoe and treatment options will get a hold off and just use wider shoes currently signed visit

## 2018-08-03 ENCOUNTER — Ambulatory Visit (INDEPENDENT_AMBULATORY_CARE_PROVIDER_SITE_OTHER): Payer: Medicaid Other

## 2018-08-03 DIAGNOSIS — J4550 Severe persistent asthma, uncomplicated: Secondary | ICD-10-CM

## 2018-08-13 ENCOUNTER — Other Ambulatory Visit (INDEPENDENT_AMBULATORY_CARE_PROVIDER_SITE_OTHER): Payer: Self-pay | Admitting: Internal Medicine

## 2018-08-14 ENCOUNTER — Telehealth: Payer: Self-pay

## 2018-08-14 DIAGNOSIS — J309 Allergic rhinitis, unspecified: Principal | ICD-10-CM

## 2018-08-14 DIAGNOSIS — H1010 Acute atopic conjunctivitis, unspecified eye: Secondary | ICD-10-CM

## 2018-08-14 MED ORDER — FEXOFENADINE HCL 180 MG PO TABS: 180.0000 mg | ORAL_TABLET | Freq: Every day | ORAL | 0 refills | Status: DC

## 2018-08-14 NOTE — Telephone Encounter (Signed)
Patient is calling to see if she can get a refill on her Allegra.  [place]

## 2018-08-14 NOTE — Telephone Encounter (Signed)
Spoke with patient and informed that I have sent a courtesy refill over to the pharmacy.

## 2018-08-19 ENCOUNTER — Ambulatory Visit
Admission: RE | Admit: 2018-08-19 | Discharge: 2018-08-19 | Disposition: A | Discharge status: Home / Self Care | Payer: Medicaid Other | Source: Ambulatory Visit | Attending: Family Medicine | Admitting: Family Medicine

## 2018-08-19 ENCOUNTER — Other Ambulatory Visit: Payer: Self-pay

## 2018-08-19 DIAGNOSIS — N6452 Nipple discharge: Secondary | ICD-10-CM

## 2018-08-19 MED ORDER — GADOBUTROL 1 MMOL/ML IV SOLN
9.0000 mL | Freq: Once | INTRAVENOUS | Status: AC | PRN
  Administered 2018-08-19: 9 mL via INTRAVENOUS

## 2018-09-03 ENCOUNTER — Other Ambulatory Visit: Payer: Self-pay

## 2018-09-03 DIAGNOSIS — J4540 Moderate persistent asthma, uncomplicated: Secondary | ICD-10-CM

## 2018-09-03 MED ORDER — ALBUTEROL SULFATE HFA 108 (90 BASE) MCG/ACT IN AERS: 1.0000 | INHALATION_SPRAY | Freq: Four times a day (QID) | RESPIRATORY_TRACT | 0 refills | Status: DC | PRN

## 2018-09-03 NOTE — Telephone Encounter (Signed)
rx ProAir sent to pharmacy

## 2018-09-03 NOTE — Telephone Encounter (Signed)
Patient is calling to see if she can get a refill on her ProAir to [facility]. We have also sent Tammy a message to see if we can do a Evisit for her visit on Wednesday since the visit is to renew her fasenra.   Thanks

## 2018-09-05 ENCOUNTER — Ambulatory Visit (INDEPENDENT_AMBULATORY_CARE_PROVIDER_SITE_OTHER): Payer: Medicaid Other | Admitting: Allergy

## 2018-09-05 ENCOUNTER — Encounter: Payer: Self-pay | Admitting: Allergy

## 2018-09-05 ENCOUNTER — Other Ambulatory Visit: Payer: Self-pay

## 2018-09-05 DIAGNOSIS — J3089 Other allergic rhinitis: Secondary | ICD-10-CM

## 2018-09-05 DIAGNOSIS — J4540 Moderate persistent asthma, uncomplicated: Secondary | ICD-10-CM

## 2018-09-05 DIAGNOSIS — T6391XD Toxic effect of contact with unspecified venomous animal, accidental (unintentional), subsequent encounter: Secondary | ICD-10-CM | POA: Diagnosis not present

## 2018-09-05 DIAGNOSIS — H1013 Acute atopic conjunctivitis, bilateral: Secondary | ICD-10-CM

## 2018-09-05 MED ORDER — OLOPATADINE HCL 0.7 % OP SOLN: 1.0000 [drp] | OPHTHALMIC | 5 refills | Status: DC

## 2018-09-05 MED ORDER — ALBUTEROL SULFATE (2.5 MG/3ML) 0.083% IN NEBU: 2.5000 mg | INHALATION_SOLUTION | RESPIRATORY_TRACT | 1 refills | Status: DC | PRN

## 2018-09-05 MED ORDER — BUDESONIDE-FORMOTEROL FUMARATE 160-4.5 MCG/ACT IN AERO: 2.0000 | INHALATION_SPRAY | Freq: Two times a day (BID) | RESPIRATORY_TRACT | 5 refills | Status: DC

## 2018-09-05 MED ORDER — AZELASTINE HCL 0.1 % NA SOLN: 2.0000 | Freq: Two times a day (BID) | NASAL | 5 refills | Status: DC

## 2018-09-05 MED ORDER — ALBUTEROL SULFATE HFA 108 (90 BASE) MCG/ACT IN AERS: 1.0000 | INHALATION_SPRAY | Freq: Four times a day (QID) | RESPIRATORY_TRACT | 1 refills | Status: DC | PRN

## 2018-09-05 MED ORDER — CARBINOXAMINE MALEATE 6 MG PO TABS: 6.0000 mg | ORAL_TABLET | Freq: Two times a day (BID) | ORAL | 5 refills | Status: DC

## 2018-09-05 MED ORDER — TRIAMCINOLONE ACETONIDE 0.025 % EX OINT: 1.0000 "application " | TOPICAL_OINTMENT | Freq: Two times a day (BID) | CUTANEOUS | 5 refills | Status: DC

## 2018-09-05 MED ORDER — MOMETASONE FUROATE 0.1 % EX OINT: 1.0000 "application " | TOPICAL_OINTMENT | Freq: Every day | CUTANEOUS | 5 refills | Status: DC | PRN

## 2018-09-05 MED ORDER — MONTELUKAST SODIUM 10 MG PO TABS: 10.0000 mg | ORAL_TABLET | Freq: Every day | ORAL | 5 refills | Status: DC

## 2018-09-05 NOTE — Progress Notes (Signed)
RE: GEANINE VANDEKAMP MRN: [id number] DOB: 03-18-1981 Date of Telemedicine Visit: 09/05/2018  Referring provider: Dianne Dun, MD Primary care provider: Dianne Dun, MD  Chief Complaint: Asthma (coughing, wheezing, coughing waking her up at night) and Allergic Rhinitis  (itchy eyes)   Telemedicine Follow Up Visit via Telephone: I connected with Adriel Desrosier for a follow up on 09/05/18 by telephone and verified that I am speaking with the correct person using two identifiers.   I discussed the limitations, risks, security and privacy concerns of performing an evaluation and management service by telephone and the availability of in person appointments. I also discussed with the patient that there may be a patient responsible charge related to this service. The patient expressed understanding and agreed to proceed.  Patient is at home.  Provider is at the office.  Visit start time: 14:03 Visit end time: 14:32 Insurance consent/check in by: Dorris Fetch  Medical consent and medical assistant/nurse: Marguerita Merles S  History of Present Illness: She is a 38 y.o. female, who is being followed for allergic rhinitis with conjunctivitis, asthma and hymenoptera allergy.  Her previous allergy office visit was on 01/12/18 with Dr. Nelva Bush.   Since her last visit she was changed from [place] to [place] and she does feel this change has improved her asthma control.  She is receiving Fasenra every 8 weeks with no issues receiving injections themselves in office.  However since her asthma was improved she states she stopped taking her symbicort and spiriva about a month ago.  Since doing so over the past several weeks she reports having more SOB and cough worse in the morning and night.  She also is no longer taking singulair either.  She states she has been needing to use her albuterol inhaler and neb about twice a week.   She also feels like Delma Freeze is not working any more.  She states the xyzal was not  effective and she has tried zyrtec and claritin the past without much relief either.  She also reports she is having itchy eyes.   She is avoiding stinging insects and has access to an epinephrine device.      Assessment and Plan: Ival is a 38 y.o. female with:  Allergic rhinitis  Continue appropriate allergen avoidance measures.  Will change to Ryvent 6mg  twice a day as she has not had improvement with standard long-acting antihistamines  Resume montelukast 10mg  daily  Continue Astelin nasal spray, 2 sprays per nostril 2 times daily for nasal drainage   Nasal saline spray (i.e., Simply Saline) or nasal saline lavage (i.e., NeilMed) is recommended as needed and prior to medicated nasal sprays.   Allergic conjunctivitis  Treatment plan as outlined above for allergic rhinitis.  Continue use of Pazeo 1 drop each eye once a day for itchy/watery/red eyes  Can also use eye lubricant drops (i.e., Natural Tears) as needed.  Moderate persistent asthma  Continue Fasenra injections every 8 weeks  Continue Symbicort 160-4.5 g, 2 inhalations twice daily   have access to albuterol inhaler 2 puffs every 4-6 hours as needed for cough/wheeze/shortness of breath/chest tightness.  May use 15-20 minutes prior to activity.   Monitor frequency of use.    To maximize pulmonary deposition, use Symbicort and your albuterol inhaler with spacer that has been provided previousy  Asthma control goals:   Full participation in all desired activities (may need albuterol before activity)  Albuterol use two time or less a week on average (not counting use with activity)  Cough interfering with sleep two time or less a month  Oral steroids no more than once a year  No hospitalizations  Hymenoptera allergy  Continue avoidance measures to prevent getting stung and have access to epinephrine device (Epipen).  Return in about 3-4 months or sooner if needed  Diagnostics: None.  Medication  List:  Current Outpatient Medications  Medication Sig Dispense Refill  . albuterol (PROVENTIL HFA;VENTOLIN HFA) 108 (90 Base) MCG/ACT inhaler Inhale 1-2 puffs into the lungs every 6 (six) hours as needed for wheezing. 1 Inhaler 1  . albuterol (PROVENTIL) (2.5 MG/3ML) 0.083% nebulizer solution Take 3 mLs (2.5 mg total) by nebulization every 4 (four) hours as needed for wheezing or shortness of breath. 75 mL 1  . ALPRAZolam (XANAX XR) 2 MG 24 hr tablet Take 2 mg by mouth 2 (two) times daily.     . Benralizumab (FASENRA) 30 MG/ML SOSY Inject 30 mg into the skin every 30 (thirty) days.    . diclofenac (VOLTAREN) 75 MG EC tablet Take 75 mg by mouth 2 (two) times daily. Take 1 tablet by mouth two times daily after meals for inflammation, pain, and swelling.     Marland Kitchen EPINEPHrine (EPIPEN 2-PAK) 0.3 mg/0.3 mL IJ SOAJ injection Inject 0.3 mg into the muscle once.    Marland Kitchen esomeprazole (NEXIUM) 40 MG capsule Take 1 capsule (40 mg total) by mouth daily at 12 noon. 30 capsule 4  . lamoTRIgine (LAMICTAL) 100 MG tablet Take 150 mg by mouth daily.     . megestrol (MEGACE) 40 MG tablet TAKE 3 TABLETS DAILY FOR 5 DAYS, 2 TABLETS DAILY FOR 5 DAYS, THEN1 TABLET DAILY UNTIL BLEEDING STOPS.    . metoprolol succinate (TOPROL-XL) 100 MG 24 hr tablet Take 100 mg by mouth daily.     . mometasone (ELOCON) 0.1 % ointment Apply 1 application topically daily as needed (eczema). 45 g 5  . montelukast (SINGULAIR) 10 MG tablet Take 1 tablet (10 mg total) by mouth at bedtime. 30 tablet 5  . neomycin-polymyxin-hydrocortisone (CORTISPORIN) OTIC solution Apply 1-2 drops to toe after soaking twice a day 10 mL 0  . nystatin (MYCOSTATIN/NYSTOP) powder APPLY TO AFFECTED AREA 2 TO 3 TIMES A DAY AS NEEDED. (Patient taking differently: Apply 1 g topically daily as needed (skin irritation). ) 60 g PRN  . Plecanatide (TRULANCE) 3 MG TABS Take 3 mg by mouth daily as needed. 30 tablet 5  . prenatal vitamin w/FE, FA (PRENATAL 1 + 1) 27-1 MG TABS tablet  Take 1 tablet by mouth daily at 12 noon. 30 each 12  . triamcinolone (KENALOG) 0.025 % ointment Apply 1 application topically 2 (two) times daily. For eczema 30 g 5  . Urea (ALUVEA) 39 % CREA Apply 1 application topically daily as needed (to heels).     Marland Kitchen azelastine (ASTELIN) 0.1 % nasal spray Place 2 sprays into both nostrils 2 (two) times daily. Use in each nostril as directed 30 mL 5  . budesonide-formoterol (SYMBICORT) 160-4.5 MCG/ACT inhaler Inhale 2 puffs into the lungs 2 (two) times daily. 1 Inhaler 5  . Carbinoxamine Maleate (RYVENT) 6 MG TABS Take 6 mg by mouth 2 (two) times daily. 60 tablet 5  . ibuprofen (ADVIL,MOTRIN) 600 MG tablet Take 1 tablet (600 mg total) by mouth every 6 (six) hours as needed. (Patient not taking: Reported on 09/05/2018) 30 tablet 1  . Olopatadine HCl (PAZEO) 0.7 % SOLN Place 1 drop into both eyes 1 day or 1 dose. 1 Bottle 5  .  Tiotropium Bromide Monohydrate (SPIRIVA RESPIMAT) 1.25 MCG/ACT AERS Inhale 2 puffs into the lungs daily. (Patient not taking: Reported on 09/05/2018) 1 Inhaler 5   Current Facility-Administered Medications  Medication Dose Route Frequency Provider Last Rate Last Dose  . Benralizumab SOSY 30 mg  30 mg Subcutaneous Q28 days Valentina Shaggy, MD   30 mg at 08/03/18 0900   Allergies: Allergies  Allergen Reactions  . Bee Venom Anaphylaxis  . Dilaudid [Hydromorphone Hcl] Anaphylaxis and Hives    Has tolerated morphine since this reaction with Benadryl  . Peanut Oil Anaphylaxis  . Senna Anaphylaxis and Hives  . Adhesive [Tape] Hives and Other (See Comments)    Pulls skin off (use paper tape)  . Latex Hives  . Bee Pollen Swelling   I reviewed her past medical history, social history, family history, and environmental history and no significant changes have been reported from previous visit on 01/12/18.  Review of Systems  Constitutional: Negative for fever.  HENT: Positive for rhinorrhea. Negative for congestion, sinus pain and sore  throat.   Eyes: Positive for itching.  Respiratory: Positive for cough and shortness of breath. Negative for chest tightness.   Cardiovascular: Negative.   Gastrointestinal: Negative for abdominal pain, constipation, diarrhea, nausea and vomiting.  Musculoskeletal: Negative for myalgias.  Skin: Negative for rash.  Neurological: Negative for dizziness.   Objective: Physical Exam Not obtained as encounter was done via telephone.   Previous notes and tests were reviewed.  I discussed the assessment and treatment plan with the patient. The patient was provided an opportunity to ask questions and all were answered. The patient agreed with the plan and demonstrated an understanding of the instructions.   The patient was advised to call back or seek an in-person evaluation if the symptoms worsen or if the condition fails to improve as anticipated.  I provided 19 minutes of non-face-to-face time during this encounter.  It was my pleasure to participate in Lolo Knight's care today. Please feel free to contact me with any questions or concerns.   Sincerely,  Croix Presley Charmian Muff, MD [facility]

## 2018-09-05 NOTE — Patient Instructions (Addendum)
Allergic rhinitis  Continue appropriate allergen avoidance measures.  Will change to Ryvent 6mg  twice a day as she has not had improvement with standard long-acting antihistamines  Resume montelukast 10mg  daily  Continue Astelin nasal spray, 2 sprays per nostril 2 times daily for nasal drainage   Nasal saline spray (i.e., Simply Saline) or nasal saline lavage (i.e., NeilMed) is recommended as needed and prior to medicated nasal sprays.   Allergic conjunctivitis  Treatment plan as outlined above for allergic rhinitis.  Continue use of Pazeo 1 drop each eye once a day for itchy/watery/red eyes  Can also use eye lubricant drops (i.e., Natural Tears) as needed.  Moderate persistent asthma  Continue Fasenra injections every 8 weeks  Continue Symbicort 160-4.5 g, 2 inhalations twice daily   have access to albuterol inhaler 2 puffs every 4-6 hours as needed for cough/wheeze/shortness of breath/chest tightness.  May use 15-20 minutes prior to activity.   Monitor frequency of use.    To maximize pulmonary deposition, use Symbicort and your albuterol inhaler with spacer that has been provided previousy  Asthma control goals:   Full participation in all desired activities (may need albuterol before activity)  Albuterol use two time or less a week on average (not counting use with activity)  Cough interfering with sleep two time or less a month  Oral steroids no more than once a year  No hospitalizations  Hymenoptera allergy  Continue avoidance measures to prevent getting stung and have access to epinephrine device (Epipen).   Return in about 3-4 months or sooner if needed

## 2018-09-05 NOTE — Progress Notes (Signed)
Start time:  [place] Time:  1432 Where are you located:  home Do you give [place] permission to bill your insurance:  yes Are you signed up for my chart:  yes  Pt states that she has been coughing, wheezing, having SOB and coughing at night.  Pt states that she has been smoking black and milds at least 2-3 per day.  Would like some help to quit again. Pt states that her allergies seem to be getting worse and the allegra is not helping. Very itchy eyes. Went to [employer] [facility] in [place] because it was closer.

## 2018-09-06 ENCOUNTER — Encounter: Payer: Self-pay | Admitting: Allergy

## 2018-09-28 ENCOUNTER — Other Ambulatory Visit: Payer: Self-pay

## 2018-09-28 ENCOUNTER — Ambulatory Visit (INDEPENDENT_AMBULATORY_CARE_PROVIDER_SITE_OTHER): Payer: Medicaid Other

## 2018-09-28 DIAGNOSIS — J4550 Severe persistent asthma, uncomplicated: Secondary | ICD-10-CM | POA: Diagnosis not present

## 2018-10-09 ENCOUNTER — Other Ambulatory Visit: Payer: Self-pay | Admitting: Allergy

## 2018-10-11 ENCOUNTER — Telehealth: Payer: Self-pay | Admitting: Adult Health

## 2018-10-11 NOTE — Telephone Encounter (Signed)
Spoke with pt. Pt had period x 9 days last month. This month, pt is 6 days late. Pt has had 2 negative pregnancy tests. Periods have been normal until now. Pt is wanting to get pregnant. Pt is having nausea today, dull back ache, sharp pains in left side and headache. Please advise. Televisit or webex?

## 2018-10-11 NOTE — Telephone Encounter (Signed)
Pt to schedule a televisit to discuss. Call transferred to front desk for appt. Sharon Vazquez

## 2018-10-11 NOTE — Telephone Encounter (Signed)
Pt calling and states that she had her menstrual cycle for 9 days last month and for this month she is 6 days late. She has had 2 negative pregnancy test. Requesting advice of what she should do.

## 2018-10-16 ENCOUNTER — Encounter: Payer: Self-pay | Admitting: *Deleted

## 2018-10-17 ENCOUNTER — Ambulatory Visit: Payer: Medicaid Other | Admitting: Adult Health

## 2018-11-07 ENCOUNTER — Other Ambulatory Visit: Payer: Self-pay

## 2018-11-07 ENCOUNTER — Ambulatory Visit (INDEPENDENT_AMBULATORY_CARE_PROVIDER_SITE_OTHER): Payer: Medicaid Other | Admitting: Allergy

## 2018-11-07 ENCOUNTER — Encounter: Payer: Self-pay | Admitting: Allergy

## 2018-11-07 VITALS — BP 122/80 | HR 84 | Temp 97.7°F | Ht 66.0 in | Wt 177.0 lb

## 2018-11-07 DIAGNOSIS — J4541 Moderate persistent asthma with (acute) exacerbation: Secondary | ICD-10-CM

## 2018-11-07 DIAGNOSIS — T6391XD Toxic effect of contact with unspecified venomous animal, accidental (unintentional), subsequent encounter: Secondary | ICD-10-CM | POA: Diagnosis not present

## 2018-11-07 DIAGNOSIS — J3089 Other allergic rhinitis: Secondary | ICD-10-CM | POA: Diagnosis not present

## 2018-11-07 DIAGNOSIS — Z720 Tobacco use: Secondary | ICD-10-CM

## 2018-11-07 DIAGNOSIS — H1013 Acute atopic conjunctivitis, bilateral: Secondary | ICD-10-CM

## 2018-11-07 MED ORDER — IPRATROPIUM BROMIDE 0.06 % NA SOLN: 2.0000 | Freq: Three times a day (TID) | NASAL | 5 refills | Status: DC

## 2018-11-07 MED ORDER — AZELASTINE HCL 0.05 % OP SOLN: 1.0000 [drp] | Freq: Two times a day (BID) | OPHTHALMIC | 5 refills | Status: DC | PRN

## 2018-11-07 MED ORDER — SPACER/AERO-HOLDING CHAMBERS DEVI: 1.0000 | Freq: Once | 0 refills | Status: AC

## 2018-11-07 MED ORDER — TIOTROPIUM BROMIDE MONOHYDRATE 1.25 MCG/ACT IN AERS: 2.0000 | INHALATION_SPRAY | Freq: Every day | RESPIRATORY_TRACT | 5 refills | Status: DC
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
## 2018-11-07 NOTE — Patient Instructions (Addendum)
Allergic rhinitis  Continue appropriate allergen avoidance measures.  Continue Ryvent 6mg  twice a day   Continue montelukast 10mg  daily  Stop Astelin as not effective  Start nasal Atrovent 2 sprays each nostril 3-4 times a day as needed for congestion or drainage  Nasal saline spray (i.e., Simply Saline) or nasal saline lavage (i.e., NeilMed) is recommended as needed and prior to medicated nasal sprays.  Allergic conjunctivitis  Treatment plan as outlined above for allergic rhinitis.  Change Pazeo to Azelastine 1 drop each eye once a twice a day for itchy/watery/red eyes  Can also use eye lubricant drops (i.e., Natural Tears) as needed.  Moderate persistent asthma  Continue Fasenra injections every 8 weeks  Continue Symbicort 160-4.5 g, 2 inhalations twice daily   Use Spiriva  2 inhalations once a day (take midday)  have access to albuterol inhaler 2 puffs every 4-6 hours as needed for cough/wheeze/shortness of breath/chest tightness.  May use 15-20 minutes prior to activity.   Monitor frequency of use.    To maximize pulmonary deposition, use Symbicort and your albuterol inhaler with spacer that has been provided previously  Asthma control goals:   Full participation in all desired activities (may need albuterol before activity)  Albuterol use two time or less a week on average (not counting use with activity)  Cough interfering with sleep two time or less a month  Oral steroids no more than once a year  No hospitalizations  Hymenoptera allergy  Continue avoidance measures to prevent getting stung and have access to epinephrine device (Epipen).  Tobacco use  Do you best to try to cut back on how much you smoke  Discuss with your PCP regarding smoking cessation option  Let us know as soon as you know if you become pregnant  Return in about 3-4 months or sooner if needed

## 2018-11-14 ENCOUNTER — Other Ambulatory Visit: Payer: Self-pay

## 2018-11-14 ENCOUNTER — Ambulatory Visit (HOSPITAL_COMMUNITY): Payer: Medicaid Other | Attending: Orthopedic Surgery

## 2018-11-14 ENCOUNTER — Encounter (HOSPITAL_COMMUNITY): Payer: Self-pay

## 2018-11-14 DIAGNOSIS — R262 Difficulty in walking, not elsewhere classified: Secondary | ICD-10-CM | POA: Diagnosis present

## 2018-11-14 DIAGNOSIS — M545 Low back pain: Secondary | ICD-10-CM | POA: Diagnosis not present

## 2018-11-14 DIAGNOSIS — M62830 Muscle spasm of back: Secondary | ICD-10-CM | POA: Diagnosis present

## 2018-11-14 DIAGNOSIS — R29898 Other symptoms and signs involving the musculoskeletal system: Secondary | ICD-10-CM | POA: Insufficient documentation

## 2018-11-14 DIAGNOSIS — G8929 Other chronic pain: Secondary | ICD-10-CM | POA: Insufficient documentation
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
## 2018-11-15 ENCOUNTER — Telehealth (HOSPITAL_COMMUNITY): Payer: Self-pay | Admitting: Family Medicine

## 2018-11-15 NOTE — Telephone Encounter (Signed)
11/15/18  I left patient a message asking if we could reschedule the 7/2 appt to 6/30 with Brooke at 8:15 since her Medicaid will run out on 7/2.

## 2018-11-20 ENCOUNTER — Telehealth (HOSPITAL_COMMUNITY): Payer: Self-pay | Admitting: Family Medicine

## 2018-11-20 NOTE — Telephone Encounter (Signed)
11/20/18  Therapist asked that patient be contacted to cx this appt since Medicaid approval runs out on 7/1 and so we scheduled for 6/30 for that reason.  pt was ok with this

## 2018-11-21 ENCOUNTER — Ambulatory Visit (HOSPITAL_COMMUNITY): Payer: Medicaid Other | Admitting: Physical Therapy

## 2018-11-21 ENCOUNTER — Other Ambulatory Visit: Payer: Self-pay

## 2018-11-21 DIAGNOSIS — G8929 Other chronic pain: Secondary | ICD-10-CM

## 2018-11-21 DIAGNOSIS — M545 Low back pain: Secondary | ICD-10-CM | POA: Diagnosis not present

## 2018-11-21 DIAGNOSIS — R262 Difficulty in walking, not elsewhere classified: Secondary | ICD-10-CM

## 2018-11-21 DIAGNOSIS — M62830 Muscle spasm of back: Secondary | ICD-10-CM
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
## 2018-11-23 ENCOUNTER — Ambulatory Visit (INDEPENDENT_AMBULATORY_CARE_PROVIDER_SITE_OTHER): Payer: Medicaid Other

## 2018-11-23 ENCOUNTER — Other Ambulatory Visit: Payer: Self-pay

## 2018-11-23 DIAGNOSIS — J4550 Severe persistent asthma, uncomplicated: Secondary | ICD-10-CM

## 2018-11-23 MED ORDER — BENRALIZUMAB 30 MG/ML ~~LOC~~ SOSY
30.0000 mg | PREFILLED_SYRINGE | SUBCUTANEOUS | Status: AC
  Administered 2018-11-23 – 2022-12-12 (×26): 30 mg via SUBCUTANEOUS

## 2018-11-26 ENCOUNTER — Ambulatory Visit: Payer: Medicaid Other | Admitting: Podiatry

## 2018-11-26 ENCOUNTER — Encounter: Payer: Self-pay | Admitting: Podiatry

## 2018-11-26 ENCOUNTER — Other Ambulatory Visit: Payer: Self-pay

## 2018-11-26 VITALS — Temp 98.0°F

## 2018-11-26 DIAGNOSIS — M2041 Other hammer toe(s) (acquired), right foot: Secondary | ICD-10-CM | POA: Diagnosis not present

## 2018-11-26 DIAGNOSIS — L600 Ingrowing nail: Secondary | ICD-10-CM | POA: Diagnosis not present

## 2018-11-26 NOTE — Patient Instructions (Signed)

## 2018-11-27 NOTE — Progress Notes (Signed)
Subjective:   Patient ID: Sharon Vazquez, female   DOB: 38 y.o.   MRN: [id number]   HPI Patient is concerned because both nails feel loose and patient did have ingrown toenails removed and states she no longer has the pain she used to   ROS      Objective:  Physical Exam  Neurovascular status intact with some chronic damage to the central portion of the nails bilateral that is related to her overall nail structure with the corners actually at this time healing well F2     Assessment:  Doing well from ingrown toenail removal with some damage of some of the nail with mild discomfort associated with it     Plan:  H&P education rendered and at this point I smoothed the dorsal surfaces bilateral to try to thin out any nail bed.  I did discuss possible full nail removal and at this patient time patient does not want which I agree completely.  Reappoint as symptoms indicate

## 2018-11-29 ENCOUNTER — Encounter (HOSPITAL_COMMUNITY): Payer: Self-pay

## 2018-11-29 ENCOUNTER — Ambulatory Visit (HOSPITAL_COMMUNITY): Payer: Medicaid Other

## 2018-11-29 ENCOUNTER — Other Ambulatory Visit: Payer: Self-pay

## 2018-11-29 DIAGNOSIS — G8929 Other chronic pain: Secondary | ICD-10-CM

## 2018-11-29 DIAGNOSIS — R262 Difficulty in walking, not elsewhere classified: Secondary | ICD-10-CM

## 2018-11-29 DIAGNOSIS — M545 Low back pain, unspecified: Secondary | ICD-10-CM

## 2018-11-29 DIAGNOSIS — R29898 Other symptoms and signs involving the musculoskeletal system: Secondary | ICD-10-CM

## 2018-11-29 DIAGNOSIS — M62830 Muscle spasm of back: Secondary | ICD-10-CM
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
## 2018-12-04 ENCOUNTER — Ambulatory Visit (HOSPITAL_COMMUNITY): Payer: Medicaid Other

## 2018-12-04 ENCOUNTER — Other Ambulatory Visit: Payer: Self-pay

## 2018-12-04 ENCOUNTER — Encounter (HOSPITAL_COMMUNITY): Payer: Self-pay

## 2018-12-04 DIAGNOSIS — M62830 Muscle spasm of back: Secondary | ICD-10-CM

## 2018-12-04 DIAGNOSIS — R262 Difficulty in walking, not elsewhere classified: Secondary | ICD-10-CM

## 2018-12-04 DIAGNOSIS — G8929 Other chronic pain: Secondary | ICD-10-CM

## 2018-12-04 DIAGNOSIS — M545 Low back pain: Secondary | ICD-10-CM | POA: Diagnosis not present

## 2018-12-04 DIAGNOSIS — R29898 Other symptoms and signs involving the musculoskeletal system: Secondary | ICD-10-CM
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
## 2018-12-06 ENCOUNTER — Encounter (HOSPITAL_COMMUNITY): Payer: Medicaid Other

## 2018-12-11 ENCOUNTER — Telehealth: Payer: Self-pay

## 2018-12-11 NOTE — Progress Notes (Signed)
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 order for diagnostic lumbar facet medial branch nerve block and recommend doing at least 1.  Hopefully patient can get his records from Dr. Jacelyn Grip her previous pain interventional pain management doctor.  Patient endorsed understanding.   Procedure Orders     LUMBAR FACET(MEDIAL BRANCH NERVE BLOCK) [facility]  Interventional management options: [name] was informed that there is no guarantee that she would be a candidate for interventional therapies. The decision will be based on the results of diagnostic studies, as well as Sharon Vazquez's risk  profile.  Procedure(s) under consideration:  Lumbar facet medial branch nerve blocks, lumbar RFA   Provider-requested follow-up: Return for Procedure B/L L3,4,5 S1 FCT w/sed.  Future Appointments  Date Time Provider [place]  12/14/2018  1:15 PM Susy Frizzle, PTA [facility] None  12/18/2018 11:30 AM Teena Irani, PTA [facility] None  12/20/2018  8:30 AM Clarene Critchley, PT [facility] None  12/31/2018 10:15 AM Francene Boyers, Lesle Chris, PT [facility] None  01/02/2019  8:30 AM Teena Irani, PTA [facility] None  01/15/2019  9:30 AM Rogene Houston, MD [facility] None  01/18/2019  9:00 AM [facility] NURSE [facility] None    Primary Care Physician: Dianne Dun, MD Location: [facility] Note by: Gillis Santa, MD Date: 12/12/2018; Time: 8:53 AM  Note: This dictation was prepared with Dragon dictation. Any transcriptional errors that may result from this process are unintentional.

## 2018-12-11 NOTE — Telephone Encounter (Signed)
Attempted to call [facility] and go over new patient information.  Patient states she has already filled out information sheet and does not see why we need to call for the same information.  Patient states she is not at home and would call us back tomorrow.

## 2018-12-12 ENCOUNTER — Other Ambulatory Visit: Payer: Self-pay

## 2018-12-12 ENCOUNTER — Encounter: Payer: Self-pay | Admitting: Student in an Organized Health Care Education/Training Program

## 2018-12-12 ENCOUNTER — Encounter (HOSPITAL_COMMUNITY): Payer: Self-pay

## 2018-12-12 ENCOUNTER — Ambulatory Visit (HOSPITAL_COMMUNITY): Payer: Medicaid Other | Attending: Orthopedic Surgery

## 2018-12-12 ENCOUNTER — Ambulatory Visit
Payer: Medicaid Other | Attending: Student in an Organized Health Care Education/Training Program | Admitting: Student in an Organized Health Care Education/Training Program

## 2018-12-12 VITALS — BP 136/91 | HR 86 | Temp 98.1°F | Ht 66.0 in | Wt 170.0 lb

## 2018-12-12 DIAGNOSIS — R262 Difficulty in walking, not elsewhere classified: Secondary | ICD-10-CM

## 2018-12-12 DIAGNOSIS — R29898 Other symptoms and signs involving the musculoskeletal system: Secondary | ICD-10-CM | POA: Diagnosis present

## 2018-12-12 DIAGNOSIS — T85192S Other mechanical complication of implanted electronic neurostimulator (electrode) of spinal cord, sequela: Secondary | ICD-10-CM | POA: Diagnosis not present

## 2018-12-12 DIAGNOSIS — M62830 Muscle spasm of back: Secondary | ICD-10-CM | POA: Insufficient documentation

## 2018-12-12 DIAGNOSIS — F1290 Cannabis use, unspecified, uncomplicated: Secondary | ICD-10-CM | POA: Insufficient documentation

## 2018-12-12 DIAGNOSIS — M961 Postlaminectomy syndrome, not elsewhere classified: Secondary | ICD-10-CM | POA: Diagnosis present

## 2018-12-12 DIAGNOSIS — G8929 Other chronic pain: Secondary | ICD-10-CM | POA: Diagnosis present

## 2018-12-12 DIAGNOSIS — M47816 Spondylosis without myelopathy or radiculopathy, lumbar region: Secondary | ICD-10-CM | POA: Insufficient documentation

## 2018-12-12 DIAGNOSIS — M545 Low back pain: Secondary | ICD-10-CM | POA: Insufficient documentation

## 2018-12-12 DIAGNOSIS — Z981 Arthrodesis status: Secondary | ICD-10-CM | POA: Diagnosis not present

## 2018-12-12 DIAGNOSIS — M5416 Radiculopathy, lumbar region: Secondary | ICD-10-CM | POA: Diagnosis present

## 2018-12-12 NOTE — Patient Instructions (Signed)
____________________________________________________________________________________________  Preparing for Procedure with Sedation  Procedure appointments are limited to planned procedures: . No Prescription Refills. . No disability issues will be discussed. . No medication changes will be discussed.  Instructions: . Oral Intake: Do not eat or drink anything for at least 8 hours prior to your procedure. . Transportation: Public transportation is not allowed. Bring an adult driver. The driver must be physically present in our waiting room before any procedure can be started. . Physical Assistance: Bring an adult physically capable of assisting you, in the event you need help. This adult should keep you company at home for at least 6 hours after the procedure. . Blood Pressure Medicine: Take your blood pressure medicine with a sip of water the morning of the procedure. . Blood thinners: Notify our staff if you are taking any blood thinners. Depending on which one you take, there will be specific instructions on how and when to stop it. . Diabetics on insulin: Notify the staff so that you can be scheduled 1st case in the morning. If your diabetes requires high dose insulin, take only  of your normal insulin dose the morning of the procedure and notify the staff that you have done so. . Preventing infections: Shower with an antibacterial soap the morning of your procedure. . Build-up your immune system: Take 1000 mg of Vitamin C with every meal (3 times a day) the day prior to your procedure. . Antibiotics: Inform the staff if you have a condition or reason that requires you to take antibiotics before dental procedures. . Pregnancy: If you are pregnant, call and cancel the procedure. . Sickness: If you have a cold, fever, or any active infections, call and cancel the procedure. . Arrival: You must be in the facility at least 30 minutes prior to your scheduled procedure. . Children: Do not bring  children with you. . Dress appropriately: Bring dark clothing that you would not mind if they get stained. . Valuables: Do not bring any jewelry or valuables.  Reasons to call and reschedule or cancel your procedure: (Following these recommendations will minimize the risk of a serious complication.) . Surgeries: Avoid having procedures within 2 weeks of any surgery. (Avoid for 2 weeks before or after any surgery). . Flu Shots: Avoid having procedures within 2 weeks of a flu shots or . (Avoid for 2 weeks before or after immunizations). . Barium: Avoid having a procedure within 7-10 days after having had a radiological study involving the use of radiological contrast. (Myelograms, Barium swallow or enema study). . Heart attacks: Avoid any elective procedures or surgeries for the initial 6 months after a "Myocardial Infarction" (Heart Attack). . Blood thinners: It is imperative that you stop these medications before procedures. Let us know if you if you take any blood thinner.  . Infection: Avoid procedures during or within two weeks of an infection (including chest colds or gastrointestinal problems). Symptoms associated with infections include: Localized redness, fever, chills, night sweats or profuse sweating, burning sensation when voiding, cough, congestion, stuffiness, runny nose, sore throat, diarrhea, nausea, vomiting, cold or Flu symptoms, recent or current infections. It is specially important if the infection is over the area that we intend to treat. . Heart and lung problems: Symptoms that may suggest an active cardiopulmonary problem include: cough, chest pain, breathing difficulties or shortness of breath, dizziness, ankle swelling, uncontrolled high or unusually low blood pressure, and/or palpitations. If you are experiencing any of these symptoms, cancel your procedure and contact   your primary care physician for an evaluation.  Remember:  Regular Business hours are:  Monday to Thursday  8:00 AM to 4:00 PM  Provider's Schedule: Milinda Pointer, MD:  Procedure days: Tuesday and Thursday 7:30 AM to 4:00 PM  Gillis Santa, MD:  Procedure days: Monday and Wednesday 7:30 AM to 4:00 PM ____________________________________________________________________________________________   Facet Joint Block The facet joints connect the bones of the spine (vertebrae). They make it possible for you to bend, twist, and make other movements with your spine. They also keep you from bending too far, twisting too far, and making other extreme movements. A facet joint block is a procedure in which a numbing medicine (anesthetic) is injected into a facet joint. In many cases, an anti-inflammatory medicine (steroid) is also injected. A facet joint block may be done:  To diagnose neck or back pain. If the pain gets better after a facet joint block, it means the pain is probably coming from the facet joint. If the pain does not get better, it means the pain is probably not coming from the facet joint.  To relieve neck or back pain that is caused by an inflamed facet joint. A facet joint block is only done to relieve pain if the pain does not improve with other methods, such as medicine, exercise programs, and physical therapy. Tell a health care provider about:  Any allergies you have.  All medicines you are taking, including vitamins, herbs, eye drops, creams, and over-the-counter medicines.  Any problems you or family members have had with anesthetic medicines.  Any blood disorders you have.  Any surgeries you have had.  Any medical conditions you have or have had.  Whether you are pregnant or may be pregnant. What are the risks? Generally, this is a safe procedure. However, problems may occur, including:  Bleeding.  Injury to a nerve near the injection site.  Pain at the injection site.  Weakness or numbness in areas controlled by nerves near the injection site.  Infection.   Temporary fluid retention.  Allergic reactions to medicines or dyes.  Injury to other structures or organs near the injection site. What happens before the procedure? Medicines Ask your health care provider about:  Changing or stopping your regular medicines. This is especially important if you are taking diabetes medicines or blood thinners.  Taking medicines such as aspirin and ibuprofen. These medicines can thin your blood. Do not take these medicines unless your health care provider tells you to take them.  Taking over-the-counter medicines, vitamins, herbs, and supplements. Eating and drinking Follow instructions from your health care provider about eating and drinking, which may include:  8 hours before the procedure - stop eating heavy meals or foods, such as meat, fried foods, or fatty foods.  6 hours before the procedure - stop eating light meals or foods, such as toast or cereal.  6 hours before the procedure - stop drinking milk or drinks that contain milk.  2 hours before the procedure - stop drinking clear liquids. Staying hydrated Follow instructions from your health care provider about hydration, which may include:  Up to 2 hours before the procedure - you may continue to drink clear liquids, such as water, clear fruit juice, black coffee, and plain tea. General instructions  Do not use any products that contain nicotine or tobacco for at least 4-6 weeks before the procedure. These products include cigarettes, e-cigarettes, and chewing tobacco. If you need help quitting, ask your health care provider.  Plan  to have someone take you home from the hospital or clinic.  Ask your health care provider: ? How your surgery site will be marked. ? What steps will be taken to help prevent infection. These may include:  Removing hair at the surgery site.  Washing skin with a germ-killing soap.  Receiving antibiotic medicine. What happens during the procedure?   You  will put on a hospital gown.  You will lie on your stomach on an X-ray table. You may be asked to lie in a different position if an injection will be made in your neck.  Machines will be used to monitor your oxygen levels, heart rate, and blood pressure.  Your skin will be cleaned.  If an injection will be made in your neck, an IV will be inserted into one of your veins. Fluids and medicine will flow directly into your body through the IV.  A numbing medicine (local anesthetic) will be applied to your skin. Your skin may sting or burn for a moment.  A video X-ray machine (fluoroscopy) will be used to find the joint. In some cases, a CT scan may be used.  A contrast dye may be injected into the facet joint area to help find the joint.  When the joint is located, an anesthetic will be injected into the joint through the needle.  Your health care provider will ask you whether you feel pain relief. ? If you feel relief, a steroid may be injected to provide pain relief for a longer period of time. ? If you do not feel relief or feel only partial relief, additional injections of an anesthetic may be made in other facet joints.  The needle will be removed.  Your skin will be cleaned.  A bandage (dressing) will be applied over each injection site. The procedure may vary among health care providers and hospitals. What happens after the procedure?  Your blood pressure, heart rate, breathing rate, and blood oxygen level will be monitored until you leave the hospital or clinic.  You will lie down and rest for a period of time. Summary  A facet joint block is a procedure in which a numbing medicine (anesthetic) is injected into a facet joint. An anti-inflammatory medicine (stereoid) may also be injected.  Follow instructions from your health care provider about medicines and eating and drinking before the procedure.  Do not use any products that contain nicotine or tobacco for at least 4-6  weeks before the procedure.  You will lie on your stomach for the procedure, but you may be asked to lie in a different position if an injection will be made in your neck.  When the joint is located, an anesthetic will be injected into the joint through the needle. This information is not intended to replace advice given to you by your health care provider. Make sure you discuss any questions you have with your health care provider. Document Released: 10/12/2006 Document Revised: 09/13/2018 Document Reviewed: 04/27/2018 [employer] Patient Education  2020 [employer].
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
## 2018-12-13 ENCOUNTER — Telehealth: Payer: Self-pay | Admitting: Student in an Organized Health Care Education/Training Program

## 2018-12-13 ENCOUNTER — Encounter: Payer: Self-pay | Admitting: Student in an Organized Health Care Education/Training Program

## 2018-12-13 NOTE — Telephone Encounter (Signed)
Patient call to see if we have gotten notes from previous physician. She states Dr. Holley Raring wants to see notes before scheduling procedure appt. And she is in a lot of pain wants to come for procedure asap. We have received notes and I placed them on your desk this am. Please let us know what you want to schedule patient for next. Thank you

## 2018-12-13 NOTE — Telephone Encounter (Signed)
Patient scheduled 12-19-18 for procedure. notified

## 2018-12-14 ENCOUNTER — Ambulatory Visit (HOSPITAL_COMMUNITY): Payer: Medicaid Other

## 2018-12-14 ENCOUNTER — Ambulatory Visit: Payer: Medicaid Other | Admitting: Podiatry

## 2018-12-17 ENCOUNTER — Telehealth: Payer: Self-pay | Admitting: *Deleted

## 2018-12-18 ENCOUNTER — Ambulatory Visit (HOSPITAL_COMMUNITY): Payer: Medicaid Other | Admitting: Physical Therapy

## 2018-12-18 ENCOUNTER — Other Ambulatory Visit: Payer: Self-pay

## 2018-12-18 DIAGNOSIS — M545 Low back pain, unspecified: Secondary | ICD-10-CM

## 2018-12-18 DIAGNOSIS — R262 Difficulty in walking, not elsewhere classified: Secondary | ICD-10-CM

## 2018-12-18 DIAGNOSIS — G8929 Other chronic pain: Secondary | ICD-10-CM

## 2018-12-18 DIAGNOSIS — M62830 Muscle spasm of back: Secondary | ICD-10-CM
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
## 2018-12-19 ENCOUNTER — Encounter: Payer: Self-pay | Admitting: Student in an Organized Health Care Education/Training Program

## 2018-12-19 ENCOUNTER — Ambulatory Visit (HOSPITAL_BASED_OUTPATIENT_CLINIC_OR_DEPARTMENT_OTHER): Payer: Medicaid Other | Admitting: Student in an Organized Health Care Education/Training Program

## 2018-12-19 ENCOUNTER — Telehealth (HOSPITAL_COMMUNITY): Payer: Self-pay | Admitting: Family Medicine

## 2018-12-19 ENCOUNTER — Other Ambulatory Visit: Payer: Self-pay

## 2018-12-19 ENCOUNTER — Ambulatory Visit
Admission: RE | Admit: 2018-12-19 | Discharge: 2018-12-19 | Disposition: A | Discharge status: Home / Self Care | Payer: Medicaid Other | Source: Ambulatory Visit | Attending: Student in an Organized Health Care Education/Training Program | Admitting: Student in an Organized Health Care Education/Training Program

## 2018-12-19 DIAGNOSIS — M47816 Spondylosis without myelopathy or radiculopathy, lumbar region: Secondary | ICD-10-CM | POA: Diagnosis present

## 2018-12-19 MED ORDER — ROPIVACAINE HCL 2 MG/ML IJ SOLN
1.0000 mL | Freq: Once | INTRAMUSCULAR | Status: AC
  Administered 2018-12-19: 1 mL via EPIDURAL
  Filled 2018-12-19: qty 10

## 2018-12-19 MED ORDER — MIDAZOLAM HCL 5 MG/5ML IJ SOLN: 1.0000 mg | INTRAMUSCULAR | Status: DC | PRN

## 2018-12-19 MED ORDER — DEXAMETHASONE SODIUM PHOSPHATE 10 MG/ML IJ SOLN
10.0000 mg | Freq: Once | INTRAMUSCULAR | Status: AC
  Administered 2018-12-19: 10 mg
  Filled 2018-12-19: qty 1

## 2018-12-19 MED ORDER — FENTANYL CITRATE (PF) 100 MCG/2ML IJ SOLN
25.0000 ug | INTRAMUSCULAR | Status: DC | PRN
  Administered 2018-12-19: 50 ug via INTRAVENOUS
  Filled 2018-12-19: qty 2

## 2018-12-19 MED ORDER — LIDOCAINE HCL 2 % IJ SOLN
20.0000 mL | Freq: Once | INTRAMUSCULAR | Status: AC
  Administered 2018-12-19: 13:00:00 400 mg
  Filled 2018-12-19: qty 20

## 2018-12-19 MED ORDER — ROPIVACAINE HCL 2 MG/ML IJ SOLN
2.0000 mL | Freq: Once | INTRAMUSCULAR | Status: AC
  Administered 2018-12-19: 2 mL via EPIDURAL
  Filled 2018-12-19: qty 10

## 2018-12-19 MED ORDER — MIDAZOLAM HCL 5 MG/5ML IJ SOLN
INTRAMUSCULAR | Status: AC
  Filled 2018-12-19: qty 5

## 2018-12-19 NOTE — Telephone Encounter (Signed)
12/19/18  pt called to cx said that she had a procedure done yesterday and the dr advised her to not do therapy unless it was later in the day... I looked but nothing was available and offered her 7/17 but she couldn't come the

## 2018-12-19 NOTE — Patient Instructions (Signed)

## 2018-12-19 NOTE — Progress Notes (Signed)
Patient's Name: Sharon Vazquez  MRN: [id number]  Referring Provider: Gillis Santa, MD  DOB: Dec 02, 1980  PCP: Dianne Dun, MD  DOS: 12/19/2018  Note by: Gillis Santa, MD  Service setting: Ambulatory outpatient  Specialty: Interventional Pain Management  Patient type: Established  Location: [facility] (AMB) Pain Management Facility  Visit type: Interventional Procedure   Primary Reason for Visit: Interventional Pain Management Treatment. CC: Back Pain (low)  Procedure:          Anesthesia, Analgesia, Anxiolysis:  Type: Lumbar Facet, Medial Branch Block(s) #1  Primary Purpose: Diagnostic Region: Posterolateral Lumbosacral Spine Level: L3, L4, L5, & S1 Medial Branch Level(s). Injecting these levels blocks the L3-4, L4-5, and L5-S1 lumbar facet joints. Laterality: Bilateral  Type: Moderate (Conscious) Sedation combined with Local Anesthesia Indication(s): Analgesia and Anxiety Route: Intravenous (IV) IV Access: Secured Sedation: Meaningful verbal contact was maintained at all times during the procedure  Local Anesthetic: Lidocaine 1-2%  Position: Prone   Indications: 1. Lumbar facet arthropathy    Pain Score: Pre-procedure: 6 /10 Post-procedure: 0-No pain/10  Pre-op Assessment:  Sharon Vazquez is a 38 y.o. (year old), female patient, seen today for interventional treatment. She  has a past surgical history that includes Wisdom tooth extraction; bunion removal; Cholecystectomy (10 yrs ago); Colonoscopy (N/A, 08/22/2012); Tonsillectomy; Cholecystectomy (10+yrs ago); Tonsillectomy; Bunionectomy (Left); wisdom teeth extracted; epidural injections; Colonoscopy; Lumbar laminectomy/decompression microdiscectomy (Left, 07/03/2013); Lumbar laminectomy/decompression microdiscectomy (Left, 11/29/2013); Back surgery; Spinal cord stimulator trial (N/A, 04/23/2014); Spinal cord stimulator insertion (N/A, 06/13/2014); Spinal cord stimulator removal (N/A, 12/17/2014); Colonoscopy with propofol (N/A, 09/25/2015);  polypectomy (09/25/2015); Lumbar fusion (04/06/2016); Hemorrhoid surgery (N/A, 04/23/2018); and ORIF zygomatic fracture (Left, 04/27/2018). Sharon Vazquez has a current medication list which includes the following prescription(s): albuterol, albuterol, alprazolam, alum & mag hydroxide-simeth, azelastine, benralizumab, budesonide-formoterol, clomiphene, b12 folate, cyclobenzaprine, epinephrine, esomeprazole, ibuprofen, lamotrigine, metoprolol succinate, mometasone, montelukast, prenatal vitamin w/fe, fa, ryvent, tiotropium bromide monohydrate, aluvea, diclofenac, ipratropium, megestrol, metoprolol tartrate, neomycin-polymyxin-hydrocortisone, olopatadine hcl, plecanatide, and triamcinolone, and the following Facility-Administered Medications: benralizumab, fentanyl, and midazolam. Her primarily concern today is the Back Pain (low)  Initial Vital Signs:  Pulse/HCG Rate: 73ECG Heart Rate: 70 Temp: 98.2 F (36.8 C) Resp: 18 BP: (!) 126/93 SpO2: 100 %  BMI: Estimated body mass index is 27.44 kg/m as calculated from the following:   Height as of this encounter: 5\' 6"  (1.676 m).   Weight as of this encounter: 170 lb (77.1 kg).  Risk Assessment: Allergies: Reviewed. She is allergic to bee venom; dilaudid [hydromorphone hcl]; peanut oil; senna; adhesive [tape]; latex; and bee pollen.  Allergy Precautions: None required Coagulopathies: Reviewed. None identified.  Blood-thinner therapy: None at this time Active Infection(s): Reviewed. None identified. Sharon Vazquez is afebrile  Site Confirmation: Sharon Vazquez was asked to confirm the procedure and laterality before marking the site Procedure checklist: Completed Consent: Before the procedure and under the influence of no sedative(s), amnesic(s), or anxiolytics, the patient was informed of the treatment options, risks and possible complications. To fulfill our ethical and legal obligations, as recommended by the American Medical Association's Code of Ethics, I  have informed the patient of my clinical impression; the nature and purpose of the treatment or procedure; the risks, benefits, and possible complications of the intervention; the alternatives, including doing nothing; the risk(s) and benefit(s) of the alternative treatment(s) or procedure(s); and the risk(s) and benefit(s) of doing nothing. The patient was provided information about the general risks and possible complications associated with the procedure. These may include, but are  not limited to: failure to achieve desired goals, infection, bleeding, organ or nerve damage, allergic reactions, paralysis, and death. In addition, the patient was informed of those risks and complications associated to Spine-related procedures, such as failure to decrease pain; infection (i.e.: Meningitis, epidural or intraspinal abscess); bleeding (i.e.: epidural hematoma, subarachnoid hemorrhage, or any other type of intraspinal or peri-dural bleeding); organ or nerve damage (i.e.: Any type of peripheral nerve, nerve root, or spinal cord injury) with subsequent damage to sensory, motor, and/or autonomic systems, resulting in permanent pain, numbness, and/or weakness of one or several areas of the body; allergic reactions; (i.e.: anaphylactic reaction); and/or death. Furthermore, the patient was informed of those risks and complications associated with the medications. These include, but are not limited to: allergic reactions (i.e.: anaphylactic or anaphylactoid reaction(s)); adrenal axis suppression; blood sugar elevation that in diabetics may result in ketoacidosis or comma; water retention that in patients with history of congestive heart failure may result in shortness of breath, pulmonary edema, and decompensation with resultant heart failure; weight gain; swelling or edema; medication-induced neural toxicity; particulate matter embolism and blood vessel occlusion with resultant organ, and/or nervous system infarction;  and/or aseptic necrosis of one or more joints. Finally, the patient was informed that Medicine is not an exact science; therefore, there is also the possibility of unforeseen or unpredictable risks and/or possible complications that may result in a catastrophic outcome. The patient indicated having understood very clearly. We have given the patient no guarantees and we have made no promises. Enough time was given to the patient to ask questions, all of which were answered to the patient's satisfaction. Sharon Vazquez has indicated that she wanted to continue with the procedure. Attestation: I, the ordering provider, attest that I have discussed with the patient the benefits, risks, side-effects, alternatives, likelihood of achieving goals, and potential problems during recovery for the procedure that I have provided informed consent. Date   Time: 12/19/2018 12:28 PM  Pre-Procedure Preparation:  Monitoring: As per clinic protocol. Respiration, ETCO2, SpO2, BP, heart rate and rhythm monitor placed and checked for adequate function Safety Precautions: Patient was assessed for positional comfort and pressure points before starting the procedure. Time-out: I initiated and conducted the "Time-out" before starting the procedure, as per protocol. The patient was asked to participate by confirming the accuracy of the "Time Out" information. Verification of the correct person, site, and procedure were performed and confirmed by me, the nursing staff, and the patient. "Time-out" conducted as per Joint Commission's Universal Protocol (UP.01.01.01). Time: 1321  Description of Procedure:          Laterality: Bilateral. The procedure was performed in identical fashion on both sides. Levels:   L3, L4, L5, & S1 Medial Branch Level(s) Area Prepped: Posterior Lumbosacral Region Prepping solution: DuraPrep (Iodine Povacrylex [0.7% available iodine] and Isopropyl Alcohol, 74% w/w) Safety Precautions: Aspiration looking for  blood return was conducted prior to all injections. At no point did we inject any substances, as a needle was being advanced. Before injecting, the patient was told to immediately notify me if she was experiencing any new onset of "ringing in the ears, or metallic taste in the mouth". No attempts were made at seeking any paresthesias. Safe injection practices and needle disposal techniques used. Medications properly checked for expiration dates. SDV (single dose vial) medications used. After the completion of the procedure, all disposable equipment used was discarded in the proper designated medical waste containers. Local Anesthesia: Protocol guidelines were followed. The patient was  positioned over the fluoroscopy table. The area was prepped in the usual manner. The time-out was completed. The target area was identified using fluoroscopy. A 12-in long, straight, sterile hemostat was used with fluoroscopic guidance to locate the targets for each level blocked. Once located, the skin was marked with an approved surgical skin marker. Once all sites were marked, the skin (epidermis, dermis, and hypodermis), as well as deeper tissues (fat, connective tissue and muscle) were infiltrated with a small amount of a short-acting local anesthetic, loaded on a 10cc syringe with a 25G, 1.5-in  Needle. An appropriate amount of time was allowed for local anesthetics to take effect before proceeding to the next step. Local Anesthetic: Lidocaine 2.0% The unused portion of the local anesthetic was discarded in the proper designated containers. Technical explanation of process:   L3 Medial Branch Nerve Block (MBB): The target area for the L3 medial branch is at the junction of the postero-lateral aspect of the superior articular process and the superior, posterior, and medial edge of the transverse process of L4. Under fluoroscopic guidance, a Quincke needle was inserted until contact was made with os over the superior  postero-lateral aspect of the pedicular shadow (target area). After negative aspiration for blood, 19mL of the nerve block solution was injected without difficulty or complication. The needle was removed intact. L4 Medial Branch Nerve Block (MBB): The target area for the L4 medial branch is at the junction of the postero-lateral aspect of the superior articular process and the superior, posterior, and medial edge of the transverse process of L5. Under fluoroscopic guidance, a Quincke needle was inserted until contact was made with os over the superior postero-lateral aspect of the pedicular shadow (target area). After negative aspiration for blood, 32mL of the nerve block solution was injected without difficulty or complication. The needle was removed intact. L5 Medial Branch Nerve Block (MBB): The target area for the L5 medial branch is at the junction of the postero-lateral aspect of the superior articular process and the superior, posterior, and medial edge of the sacral ala. Under fluoroscopic guidance, a Quincke needle was inserted until contact was made with os over the superior postero-lateral aspect of the pedicular shadow (target area). After negative aspiration for blood, 1 mL of the nerve block solution was injected without difficulty or complication. The needle was removed intact. S1 Medial Branch Nerve Block (MBB): The target area for the S1 medial branch is at the posterior and inferior 6 o'clock position of the L5-S1 facet joint. Under fluoroscopic guidance, the Quincke needle inserted for the L5 MBB was redirected until contact was made with os over the inferior and postero aspect of the sacrum, at the 6 o' clock position under the L5-S1 facet joint (Target area). After negative aspiration for blood, 1 mL of the nerve block solution was injected without difficulty or complication. The needle was removed intact.  Nerve block solution: 10 cc solution made of 8 cc of 0.2% ropivacaine, 2 cc of  Decadron 10 mg/cc.  1 cc injected at each level above bilaterally.  The unused portion of the solution was discarded in the proper designated containers. Procedural Needles: 22-gauge, 3.5-inch, Quincke needles used for all levels.  Once the entire procedure was completed, the treated area was cleaned, making sure to leave some of the prepping solution back to take advantage of its long term bactericidal properties.   Illustration of the posterior view of the lumbar spine and the posterior neural structures. Laminae of L2 through S1  are labeled. DPRL5, dorsal primary ramus of L5; DPRS1, dorsal primary ramus of S1; DPR3, dorsal primary ramus of L3; FJ, facet (zygapophyseal) joint L3-L4; I, inferior articular process of L4; LB1, lateral branch of dorsal primary ramus of L1; IAB, inferior articular branches from L3 medial branch (supplies L4-L5 facet joint); IBP, intermediate branch plexus; MB3, medial branch of dorsal primary ramus of L3; NR3, third lumbar nerve root; S, superior articular process of L5; SAB, superior articular branches from L4 (supplies L4-5 facet joint also); TP3, transverse process of L3.  Vitals:   12/19/18 1335 12/19/18 1344 12/19/18 1354 12/19/18 1404  BP: (!) 143/96 (!) 139/110 (!) 148/103 (!) 140/106  Pulse: 64     Resp: 16 20 19 20   Temp:  98.4 F (36.9 C)    TempSrc:      SpO2: 100% 100% 100% 100%  Weight:      Height:         Start Time: 1321 hrs. End Time: 1337 hrs.  Imaging Guidance (Spinal):          Type of Imaging Technique: Fluoroscopy Guidance (Spinal) Indication(s): Assistance in needle guidance and placement for procedures requiring needle placement in or near specific anatomical locations not easily accessible without such assistance. Exposure Time: Please see nurses notes. Contrast: None used. Fluoroscopic Guidance: I was personally present during the use of fluoroscopy. "Tunnel Vision Technique" used to obtain the best possible view of the target  area. Parallax error corrected before commencing the procedure. "Direction-depth-direction" technique used to introduce the needle under continuous pulsed fluoroscopy. Once target was reached, antero-posterior, oblique, and lateral fluoroscopic projection used confirm needle placement in all planes. Images permanently stored in EMR. Interpretation: No contrast injected. I personally interpreted the imaging intraoperatively. Adequate needle placement confirmed in multiple planes. Permanent images saved into the patient's record.  Antibiotic Prophylaxis:   Anti-infectives (From admission, onward)   None     Indication(s): None identified  Post-operative Assessment:  Post-procedure Vital Signs:  Pulse/HCG Rate: 6472 Temp: 98.4 F (36.9 C) Resp: 20 BP: (!) 140/106 SpO2: 100 %  EBL: None  Complications: No immediate post-treatment complications observed by team, or reported by patient.  Note: The patient tolerated the entire procedure well. A repeat set of vitals were taken after the procedure and the patient was kept under observation following institutional policy, for this type of procedure. Post-procedural neurological assessment was performed, showing return to baseline, prior to discharge. The patient was provided with post-procedure discharge instructions, including a section on how to identify potential problems. Should any problems arise concerning this procedure, the patient was given instructions to immediately contact us, at any time, without hesitation. In any case, we plan to contact the patient by telephone for a follow-up status report regarding this interventional procedure.  Comments:  No additional relevant information. 5 out of 5 strength bilateral lower extremity: Plantar flexion, dorsiflexion, knee flexion, knee extension.  Plan of Care  Orders:  Orders Placed This Encounter  Procedures   DG PAIN CL[facility] C-ARM 1-60 MIN NO REPORT    Intraoperative interpretation by  procedural physician at [place].    Standing Status:   Standing    Number of Occurrences:   1    Order Specific Question:   Reason for exam:    Answer:   Assistance in needle guidance and placement for procedures requiring needle placement in or near specific anatomical locations not easily accessible without such assistance.   Medications ordered for procedure: Meds ordered  this encounter  Medications   lidocaine (XYLOCAINE) 2 % (with pres) injection 400 mg   fentaNYL (SUBLIMAZE) injection 25-50 mcg    Make sure Narcan is available in the pyxis when using this medication. In the event of respiratory depression (RR< 8/min): Titrate NARCAN (naloxone) in increments of 0.1 to 0.2 mg IV at 2-3 minute intervals, until desired degree of reversal.   ropivacaine (PF) 2 mg/mL (0.2%) (NAROPIN) injection 1 mL   dexamethasone (DECADRON) injection 10 mg   ropivacaine (PF) 2 mg/mL (0.2%) (NAROPIN) injection 2 mL   dexamethasone (DECADRON) injection 10 mg   midazolam (VERSED) 5 MG/5ML injection 1-2 mg    Make sure Flumazenil is available in the pyxis when using this medication. If oversedation occurs, administer 0.2 mg IV over 15 sec. If after 45 sec no response, administer 0.2 mg again over 1 min; may repeat at 1 min intervals; not to exceed 4 doses (1 mg)   Medications administered: We administered lidocaine, fentaNYL, ropivacaine (PF) 2 mg/mL (0.2%), dexamethasone, ropivacaine (PF) 2 mg/mL (0.2%), and dexamethasone.  See the medical record for exact dosing, route, and time of administration.  Follow-up plan:   Return in about 4 weeks (around 01/16/2019) for Post Procedure Evaluation, virtual.      Status post lumbar facets at L3, L4, L5, S1 b/l on 12/19/2018   Recent Visits Date Type Provider Dept  12/12/18 Office Visit Gillis Santa, MD [facility]  Showing recent visits within past 90 days and meeting all other requirements   Today's Visits Date Type Provider Dept   12/19/18 Procedure visit Gillis Santa, MD [facility]  Showing today's visits and meeting all other requirements   Future Appointments Date Type Provider Dept  01/17/19 Appointment Gillis Santa, MD [facility]  Showing future appointments within next 90 days and meeting all other requirements   Disposition: Discharge home  Discharge Date & Time: 12/19/2018; 1405 hrs.   Primary Care Physician: Dianne Dun, MD Location: [facility] Note by: Gillis Santa, MD Date: 12/19/2018; Time: 3:04 PM  Disclaimer:  Medicine is not an exact science. The only guarantee in medicine is that nothing is guaranteed. It is important to note that the decision to proceed with this intervention was based on the information collected from the patient. The Data and conclusions were drawn from the patient's questionnaire, the interview, and the physical examination. Because the information was provided in large part by the patient, it cannot be guaranteed that it has not been purposely or unconsciously manipulated. Every effort has been made to obtain as much relevant data as possible for this evaluation. It is important to note that the conclusions that lead to this procedure are derived in large part from the available data. Always take into account that the treatment will also be dependent on availability of resources and existing treatment guidelines, considered by other Pain Management Practitioners as being common knowledge and practice, at the time of the intervention. For Medico-Legal purposes, it is also important to point out that variation in procedural techniques and pharmacological choices are the acceptable norm. The indications, contraindications, technique, and results of the above procedure should only be interpreted and judged by a Board-Certified Interventional Pain Specialist with extensive familiarity and expertise in the same exact procedure and  technique.

## 2018-12-19 NOTE — Progress Notes (Signed)
Safety precautions to be maintained throughout the outpatient stay will include: orient to surroundings, keep bed in low position, maintain call bell within reach at all times, provide assistance with transfer out of bed and ambulation.  

## 2018-12-20 ENCOUNTER — Telehealth: Payer: Self-pay | Admitting: *Deleted

## 2018-12-20 ENCOUNTER — Ambulatory Visit (HOSPITAL_COMMUNITY): Payer: Medicaid Other | Admitting: Physical Therapy

## 2018-12-20 NOTE — Telephone Encounter (Signed)
Called patient and she states she had some increased pain yesterday afternoon and this morning. Denies any complications. Encouraged her to use some ice and to take her Flexeril and anti inflammatory as directed. Call us if needed.

## 2018-12-24 ENCOUNTER — Ambulatory Visit (HOSPITAL_COMMUNITY): Payer: Medicaid Other

## 2018-12-24 ENCOUNTER — Encounter (HOSPITAL_COMMUNITY): Payer: Self-pay

## 2018-12-24 ENCOUNTER — Other Ambulatory Visit: Payer: Self-pay

## 2018-12-24 DIAGNOSIS — G8929 Other chronic pain: Secondary | ICD-10-CM

## 2018-12-24 DIAGNOSIS — M545 Low back pain: Secondary | ICD-10-CM | POA: Diagnosis not present

## 2018-12-24 DIAGNOSIS — M62830 Muscle spasm of back: Secondary | ICD-10-CM

## 2018-12-24 DIAGNOSIS — R262 Difficulty in walking, not elsewhere classified: Secondary | ICD-10-CM
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
## 2018-12-25 ENCOUNTER — Encounter (HOSPITAL_COMMUNITY): Payer: Medicaid Other | Admitting: Physical Therapy

## 2018-12-28 ENCOUNTER — Encounter

## 2018-12-31 ENCOUNTER — Encounter (HOSPITAL_COMMUNITY): Payer: Self-pay

## 2018-12-31 ENCOUNTER — Ambulatory Visit (HOSPITAL_COMMUNITY): Payer: Medicaid Other

## 2018-12-31 ENCOUNTER — Other Ambulatory Visit: Payer: Self-pay

## 2018-12-31 ENCOUNTER — Telehealth: Payer: Self-pay | Admitting: Student in an Organized Health Care Education/Training Program

## 2018-12-31 DIAGNOSIS — G8929 Other chronic pain: Secondary | ICD-10-CM

## 2018-12-31 DIAGNOSIS — M62830 Muscle spasm of back: Secondary | ICD-10-CM

## 2018-12-31 DIAGNOSIS — M545 Low back pain, unspecified: Secondary | ICD-10-CM

## 2018-12-31 DIAGNOSIS — R262 Difficulty in walking, not elsewhere classified: Secondary | ICD-10-CM

## 2018-12-31 MED ORDER — CYCLOBENZAPRINE HCL 10 MG PO TABS: 10.0000 mg | ORAL_TABLET | Freq: Two times a day (BID) | ORAL | 1 refills | Status: DC | PRN

## 2018-12-31 NOTE — Telephone Encounter (Signed)
Had bilateral lumbar facet on 12-19-18. Continues to have "muscle spasms" in the lower back. Also having numbness in right side of the lower back. Taking Flexeril 5 mg 1-2 tabs at bedtime, with no relief.

## 2018-12-31 NOTE — Addendum Note (Signed)
Addended by: Gillis Santa on: 12/31/2018 09:22 AM   Modules accepted: Orders

## 2018-12-31 NOTE — Telephone Encounter (Signed)
She agrees to increase Flexeril to 10 mg bid prn. Please send that in to pharmacy.
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
## 2018-12-31 NOTE — Telephone Encounter (Signed)
Pt had a procedure on 7/15 and states she is still having stabbing pain and muscle spasms and she is also going numb on the right side and wanted to know what she should do.

## 2018-12-31 NOTE — Telephone Encounter (Signed)
Muscle spasms can be common after bilateral facets. They will improve. I recommend she increase her Flexeril to 10 mg BID prn. I can call in a new Rx. If this is not helpful she can let us know and I can call in alternative.

## 2019-01-02 ENCOUNTER — Ambulatory Visit (HOSPITAL_COMMUNITY): Payer: Medicaid Other | Admitting: Physical Therapy

## 2019-01-15 ENCOUNTER — Other Ambulatory Visit (INDEPENDENT_AMBULATORY_CARE_PROVIDER_SITE_OTHER): Payer: Self-pay | Admitting: *Deleted

## 2019-01-15 ENCOUNTER — Other Ambulatory Visit: Payer: Self-pay

## 2019-01-15 ENCOUNTER — Encounter (INDEPENDENT_AMBULATORY_CARE_PROVIDER_SITE_OTHER): Payer: Self-pay | Admitting: Internal Medicine

## 2019-01-15 ENCOUNTER — Ambulatory Visit (INDEPENDENT_AMBULATORY_CARE_PROVIDER_SITE_OTHER): Payer: Medicaid Other | Admitting: Internal Medicine

## 2019-01-15 VITALS — BP 152/88 | HR 71 | Temp 98.9°F | Resp 18 | Ht 67.0 in | Wt 174.0 lb

## 2019-01-15 DIAGNOSIS — K219 Gastro-esophageal reflux disease without esophagitis: Secondary | ICD-10-CM

## 2019-01-15 DIAGNOSIS — K5909 Other constipation: Secondary | ICD-10-CM

## 2019-01-15 DIAGNOSIS — Z1159 Encounter for screening for other viral diseases: Secondary | ICD-10-CM

## 2019-01-15 MED ORDER — ESOMEPRAZOLE MAGNESIUM 40 MG PO CPDR: 40.0000 mg | DELAYED_RELEASE_CAPSULE | Freq: Every day | ORAL | 5 refills | Status: DC

## 2019-01-15 MED ORDER — CALCIUM POLYCARBOPHIL 625 MG PO TABS: 1250.0000 mg | ORAL_TABLET | Freq: Every day | ORAL | 5 refills | Status: DC

## 2019-01-15 NOTE — Patient Instructions (Signed)
Physician will call with results of blood test when completed. 

## 2019-01-15 NOTE — Progress Notes (Signed)
Presenting complaint;  Follow-up for chronic constipation and GERD.  Database and subjective:  Patient is 38 year old Afro-American female who has chronic GERD constipation as well as history of sessile serrated polyp who is here for yearly visit.  She was last seen in August 2019. Patient says she feels a lot better.  She has managed to to lose another 53 pounds in the last 1 year.  She states about 6 years ago she weighed 275 pounds.  She she has changed her eating habits.  She does not drink [employer] anymore.  She also has quit eating bread.  She also stated from snacks and fast foods.  She tries to walk every day. She feels PPI is working.  She is taking FiberCon 2 tablets daily and has to use Trulance once in a while.  She feels she may have taken 3 doses in the last 1 month.  She is requesting prescription for FiberCon as 1 month supply because of $25.  Current Medications: Outpatient Encounter Medications as of 01/15/2019  Medication Sig  . albuterol (PROVENTIL HFA;VENTOLIN HFA) 108 (90 Base) MCG/ACT inhaler Inhale 1-2 puffs into the lungs every 6 (six) hours as needed for wheezing.  Marland Kitchen albuterol (PROVENTIL) (2.5 MG/3ML) 0.083% nebulizer solution Take 3 mLs (2.5 mg total) by nebulization every 4 (four) hours as needed for wheezing or shortness of breath.  . ALPRAZolam (XANAX XR) 2 MG 24 hr tablet Take 2 mg by mouth 2 (two) times daily.   Marland Kitchen alum & mag hydroxide-simeth (MAALOX PLUS) 400-400-40 MG/5ML suspension Take by mouth every 6 (six) hours as needed for indigestion.  Marland Kitchen azelastine (OPTIVAR) 0.05 % ophthalmic solution Place 1 drop into both eyes 2 (two) times daily as needed (itchy, watery, red eyes).  . Benralizumab (FASENRA) 30 MG/ML SOSY Inject 30 mg into the skin every 30 (thirty) days.  . budesonide-formoterol (SYMBICORT) 160-4.5 MCG/ACT inhaler Inhale 2 puffs into the lungs 2 (two) times daily.  . clomiPHENE (CLOMID) 50 MG tablet TAKE 1 TABLET BY MOUTH ONCE DAILY ON CYCLE DAYS 3 7   . Cobalamin Combinations (B12 FOLATE) 800-800 MCG CAPS Take 1 capsule by mouth daily.  . cyclobenzaprine (FLEXERIL) 10 MG tablet Take 1 tablet (10 mg total) by mouth 2 (two) times daily as needed for muscle spasms.  . diclofenac (VOLTAREN) 75 MG EC tablet Take 75 mg by mouth 2 (two) times daily. Take 1 tablet by mouth two times daily after meals for inflammation, pain, and swelling.   Marland Kitchen EPINEPHrine (EPIPEN 2-PAK) 0.3 mg/0.3 mL IJ SOAJ injection Inject 0.3 mg into the muscle once.  Marland Kitchen esomeprazole (NEXIUM) 40 MG capsule Take 1 capsule (40 mg total) by mouth daily at 12 noon.  Marland Kitchen ibuprofen (ADVIL,MOTRIN) 600 MG tablet Take 1 tablet (600 mg total) by mouth every 6 (six) hours as needed. (Patient taking differently: Take 800 mg by mouth every 6 (six) hours as needed. )  . ipratropium (ATROVENT) 0.06 % nasal spray Place 2 sprays into both nostrils 3 (three) times daily.  Marland Kitchen lamoTRIgine (LAMICTAL) 100 MG tablet Take 150 mg by mouth daily.   . metoprolol succinate (TOPROL-XL) 100 MG 24 hr tablet Take 100 mg by mouth daily.   . montelukast (SINGULAIR) 10 MG tablet Take 1 tablet (10 mg total) by mouth at bedtime.  Marland Kitchen Plecanatide (TRULANCE) 3 MG TABS Take 3 mg by mouth daily as needed.  . prenatal vitamin w/FE, FA (PRENATAL 1 + 1) 27-1 MG TABS tablet Take 1 tablet by mouth daily  at 12 noon.  Marland Kitchen RYVENT 6 MG TABS TAKE 1 TABLET BY MOUTH TWICE DAILY.  Marland Kitchen Tiotropium Bromide Monohydrate (SPIRIVA RESPIMAT) 1.25 MCG/ACT AERS Inhale 2 puffs into the lungs daily.  . Urea (ALUVEA) 39 % CREA Apply 1 application topically daily as needed (to heels).   . megestrol (MEGACE) 40 MG tablet TAKE 3 TABLETS DAILY FOR 5 DAYS, 2 TABLETS DAILY FOR 5 DAYS, THEN1 TABLET DAILY UNTIL BLEEDING STOPS.  Marland Kitchen Olopatadine HCl (PAZEO) 0.7 % SOLN Place 1 drop into both eyes 1 day or 1 dose. (Patient not taking: Reported on 12/12/2018)  . [DISCONTINUED] metoprolol tartrate (LOPRESSOR) 50 MG tablet Take 100 mg by mouth daily.   . [DISCONTINUED]  mometasone (ELOCON) 0.1 % ointment Apply 1 application topically daily as needed (eczema). (Patient not taking: Reported on 01/15/2019)  . [DISCONTINUED] neomycin-polymyxin-hydrocortisone (CORTISPORIN) OTIC solution Apply 1-2 drops to toe after soaking twice a day (Patient not taking: Reported on 12/12/2018)  . [DISCONTINUED] triamcinolone (KENALOG) 0.025 % ointment Apply 1 application topically 2 (two) times daily. For eczema (Patient not taking: Reported on 12/12/2018)   Facility-Administered Encounter Medications as of 01/15/2019  Medication  . Benralizumab SOSY 30 mg     Objective: Blood pressure (!) 152/88, pulse 71, temperature 98.9 F (37.2 C), temperature source Oral, resp. rate 18, height 5\' 7"  (1.702 m), weight 174 lb (78.9 kg). Patient is alert and in no acute distress. Conjunctiva is pink. Sclera is nonicteric Oropharyngeal mucosa is normal. No neck masses or thyromegaly noted. Cardiac exam with regular rhythm normal S1 and S2. No murmur or gallop noted. Lungs are clear to auscultation. Abdomen is symmetrical.  She had umbilicus ring.  Abdomen is soft and nontender with organomegaly or masses. No LE edema or clubbing noted. She has multiple tattoos all over her body.   Assessment:  #1.  Chronic constipation.  Over the last few years she has tried number of medications and had best results with plecanatide.  With dietary changes she is not having to use this medication daily. Will give her prescription for FiberCon although not sure it would be covered by her insurance.  #2.  Chronic GERD.  She is doing well with therapy.  Will consider dropping PPI dose on her next visit.  #3.  History of sessile serrated polyp.  Her first colonoscopy was in March 2014 with removal of 10 mm broad-based cecal polyp.  Last colonoscopy in April 2017 revealed a 4 mm tubular adenoma. Next colonoscopy due in April 2022.  #4.  Weight loss appears to be voluntary.  She had gained over 100 pounds over  6 years because of multiple reasons.  Hopefully she can maintain her weight and not bounce back again.  #5.  Patient has multiple tattoos.  She has never been screened for hepatitis C.   Plan:  Prescription given for FiberCon 2 tablets to be taken every day. She will continue high-fiber diet as before. Continue esomeprazole 40 mg daily and plecanatide tight on as-needed basis. HCV antibody. Patient will return for office visit in 6 months.

## 2019-01-16 ENCOUNTER — Encounter: Payer: Self-pay | Admitting: Student in an Organized Health Care Education/Training Program

## 2019-01-16 LAB — HEPATITIS C ANTIBODY
Hepatitis C Ab: NONREACTIVE
SIGNAL TO CUT-OFF: 0.02 (ref <1.00)

## 2019-01-17 ENCOUNTER — Other Ambulatory Visit: Payer: Self-pay

## 2019-01-17 ENCOUNTER — Ambulatory Visit
Payer: Medicaid Other | Attending: Student in an Organized Health Care Education/Training Program | Admitting: Student in an Organized Health Care Education/Training Program

## 2019-01-17 ENCOUNTER — Encounter: Payer: Self-pay | Admitting: Student in an Organized Health Care Education/Training Program

## 2019-01-17 DIAGNOSIS — M47816 Spondylosis without myelopathy or radiculopathy, lumbar region: Secondary | ICD-10-CM
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
## 2019-01-18 ENCOUNTER — Ambulatory Visit (INDEPENDENT_AMBULATORY_CARE_PROVIDER_SITE_OTHER): Payer: Medicaid Other | Admitting: *Deleted

## 2019-01-18 ENCOUNTER — Other Ambulatory Visit: Payer: Self-pay

## 2019-01-18 ENCOUNTER — Ambulatory Visit: Payer: Self-pay

## 2019-01-18 DIAGNOSIS — J4550 Severe persistent asthma, uncomplicated: Secondary | ICD-10-CM

## 2019-01-19 ENCOUNTER — Other Ambulatory Visit: Payer: Self-pay | Admitting: Adult Health

## 2019-01-23 ENCOUNTER — Other Ambulatory Visit: Payer: Self-pay

## 2019-01-23 ENCOUNTER — Encounter: Payer: Self-pay | Admitting: Student in an Organized Health Care Education/Training Program

## 2019-01-23 ENCOUNTER — Ambulatory Visit
Admission: RE | Admit: 2019-01-23 | Discharge: 2019-01-23 | Disposition: A | Discharge status: Home / Self Care | Payer: Medicaid Other | Source: Ambulatory Visit | Attending: Student in an Organized Health Care Education/Training Program | Admitting: Student in an Organized Health Care Education/Training Program

## 2019-01-23 ENCOUNTER — Ambulatory Visit (HOSPITAL_BASED_OUTPATIENT_CLINIC_OR_DEPARTMENT_OTHER): Payer: Medicaid Other | Admitting: Student in an Organized Health Care Education/Training Program

## 2019-01-23 VITALS — BP 124/98 | HR 66 | Temp 98.2°F | Resp 22 | Ht 66.0 in | Wt 172.0 lb

## 2019-01-23 DIAGNOSIS — M47816 Spondylosis without myelopathy or radiculopathy, lumbar region: Secondary | ICD-10-CM

## 2019-01-23 MED ORDER — ROPIVACAINE HCL 2 MG/ML IJ SOLN
INTRAMUSCULAR | Status: AC
  Filled 2019-01-23: qty 20

## 2019-01-23 MED ORDER — DEXAMETHASONE SODIUM PHOSPHATE 10 MG/ML IJ SOLN
INTRAMUSCULAR | Status: AC
  Filled 2019-01-23: qty 2

## 2019-01-23 MED ORDER — LIDOCAINE HCL 2 % IJ SOLN
INTRAMUSCULAR | Status: AC
  Filled 2019-01-23: qty 20

## 2019-01-23 MED ORDER — ORPHENADRINE CITRATE 30 MG/ML IJ SOLN
60.0000 mg | Freq: Once | INTRAMUSCULAR | Status: AC
  Administered 2019-01-23: 13:00:00 60 mg via INTRAMUSCULAR

## 2019-01-23 MED ORDER — DEXAMETHASONE SODIUM PHOSPHATE 10 MG/ML IJ SOLN
10.0000 mg | Freq: Once | INTRAMUSCULAR | Status: AC
  Administered 2019-01-23: 12:00:00 10 mg

## 2019-01-23 MED ORDER — KETOROLAC TROMETHAMINE 60 MG/2ML IM SOLN
60.0000 mg | Freq: Once | INTRAMUSCULAR | Status: AC
  Administered 2019-01-23: 60 mg via INTRAMUSCULAR

## 2019-01-23 MED ORDER — MIDAZOLAM HCL 5 MG/5ML IJ SOLN
1.0000 mg | INTRAMUSCULAR | Status: DC | PRN
  Administered 2019-01-23: 12:00:00 1 mg via INTRAVENOUS

## 2019-01-23 MED ORDER — ROPIVACAINE HCL 2 MG/ML IJ SOLN
2.0000 mL | Freq: Once | INTRAMUSCULAR | Status: AC
  Administered 2019-01-23: 12:00:00 10 mL via EPIDURAL

## 2019-01-23 MED ORDER — FENTANYL CITRATE (PF) 100 MCG/2ML IJ SOLN
25.0000 ug | INTRAMUSCULAR | Status: DC | PRN
  Administered 2019-01-23: 13:00:00 75 ug via INTRAVENOUS

## 2019-01-23 MED ORDER — ORPHENADRINE CITRATE 30 MG/ML IJ SOLN
INTRAMUSCULAR | Status: AC
  Filled 2019-01-23: qty 2

## 2019-01-23 MED ORDER — KETOROLAC TROMETHAMINE 60 MG/2ML IM SOLN
INTRAMUSCULAR | Status: AC
  Filled 2019-01-23: qty 2

## 2019-01-23 MED ORDER — MIDAZOLAM HCL 5 MG/5ML IJ SOLN
INTRAMUSCULAR | Status: AC
  Filled 2019-01-23: qty 5

## 2019-01-23 MED ORDER — ROPIVACAINE HCL 2 MG/ML IJ SOLN
1.0000 mL | Freq: Once | INTRAMUSCULAR | Status: AC
  Administered 2019-01-23: 12:00:00 10 mL via EPIDURAL

## 2019-01-23 MED ORDER — FENTANYL CITRATE (PF) 100 MCG/2ML IJ SOLN
INTRAMUSCULAR | Status: AC
  Filled 2019-01-23: qty 2

## 2019-01-23 NOTE — Progress Notes (Signed)
Patient's Name: Sharon Vazquez  MRN: [id number]  Referring Provider: Dianne Dun, MD  DOB: 08/13/1980  PCP: Dianne Dun, MD  DOS: 01/23/2019  Note by: Gillis Santa, MD  Service setting: Ambulatory outpatient  Specialty: Interventional Pain Management  Patient type: Established  Location: [facility] (AMB) Pain Management Facility  Visit type: Interventional Procedure   Primary Reason for Visit: Interventional Pain Management Treatment. CC: Back Pain  Procedure:          Anesthesia, Analgesia, Anxiolysis:  Type: Lumbar Facet, Medial Branch Block(s) #2  Primary Purpose: Diagnostic Region: Posterolateral Lumbosacral Spine Level: L3, L4, L5, & S1 Medial Branch Level(s). Injecting these levels blocks the L3-4, L4-5, and L5-S1 lumbar facet joints. Laterality: Bilateral  Type: Moderate (Conscious) Sedation combined with Local Anesthesia Indication(s): Analgesia and Anxiety Route: Intravenous (IV) IV Access: Secured Sedation: Meaningful verbal contact was maintained at all times during the procedure  Local Anesthetic: Lidocaine 1-2%  Position: Prone   Indications: 1. Lumbar facet arthropathy   2. Lumbar spondylosis    Pain Score: Pre-procedure: 5 /10 Post-procedure: 10-Worst pain ever/10  Pre-op Assessment:  Sharon Vazquez is a 38 y.o. (year old), female patient, seen today for interventional treatment. She  has a past surgical history that includes Wisdom tooth extraction; bunion removal; Cholecystectomy (10 yrs ago); Colonoscopy (N/A, 08/22/2012); Tonsillectomy; Cholecystectomy (10+yrs ago); Tonsillectomy; Bunionectomy (Left); wisdom teeth extracted; epidural injections; Colonoscopy; Lumbar laminectomy/decompression microdiscectomy (Left, 07/03/2013); Lumbar laminectomy/decompression microdiscectomy (Left, 11/29/2013); Back surgery; Spinal cord stimulator trial (N/A, 04/23/2014); Spinal cord stimulator insertion (N/A, 06/13/2014); Spinal cord stimulator removal (N/A, 12/17/2014); Colonoscopy with  propofol (N/A, 09/25/2015); polypectomy (09/25/2015); Lumbar fusion (04/06/2016); Hemorrhoid surgery (N/A, 04/23/2018); and ORIF zygomatic fracture (Left, 04/27/2018). Sharon Vazquez has a current medication list which includes the following prescription(s): albuterol, albuterol, alprazolam, alum & mag hydroxide-simeth, azelastine, benralizumab, budesonide-formoterol, clomiphene, b12 folate, cyclobenzaprine, diclofenac, epinephrine, esomeprazole, ibuprofen, ipratropium, lamotrigine, metoprolol succinate, montelukast, plecanatide, polycarbophil, prenatal, ryvent, tiotropium bromide monohydrate, aluvea, and olopatadine hcl, and the following Facility-Administered Medications: benralizumab, fentanyl, and midazolam. Her primarily concern today is the Back Pain  Initial Vital Signs:  Pulse/HCG Rate: 60ECG Heart Rate: 63 Temp: 98.2 F (36.8 C) Resp: 16 BP: (!) 115/93 SpO2: 100 %  BMI: Estimated body mass index is 27.76 kg/m as calculated from the following:   Height as of this encounter: 5\' 6"  (1.676 m).   Weight as of this encounter: 172 lb (78 kg).  Risk Assessment: Allergies: Reviewed. She is allergic to bee venom; dilaudid [hydromorphone hcl]; peanut oil; senna; adhesive [tape]; latex; and bee pollen.  Allergy Precautions: None required Coagulopathies: Reviewed. None identified.  Blood-thinner therapy: None at this time Active Infection(s): Reviewed. None identified. Sharon Vazquez is afebrile  Site Confirmation: Sharon Vazquez was asked to confirm the procedure and laterality before marking the site Procedure checklist: Completed Consent: Before the procedure and under the influence of no sedative(s), amnesic(s), or anxiolytics, the patient was informed of the treatment options, risks and possible complications. To fulfill our ethical and legal obligations, as recommended by the American Medical Association's Code of Ethics, I have informed the patient of my clinical impression; the nature and purpose of  the treatment or procedure; the risks, benefits, and possible complications of the intervention; the alternatives, including doing nothing; the risk(s) and benefit(s) of the alternative treatment(s) or procedure(s); and the risk(s) and benefit(s) of doing nothing. The patient was provided information about the general risks and possible complications associated with the procedure. These may include, but are not limited to: failure  to achieve desired goals, infection, bleeding, organ or nerve damage, allergic reactions, paralysis, and death. In addition, the patient was informed of those risks and complications associated to Spine-related procedures, such as failure to decrease pain; infection (i.e.: Meningitis, epidural or intraspinal abscess); bleeding (i.e.: epidural hematoma, subarachnoid hemorrhage, or any other type of intraspinal or peri-dural bleeding); organ or nerve damage (i.e.: Any type of peripheral nerve, nerve root, or spinal cord injury) with subsequent damage to sensory, motor, and/or autonomic systems, resulting in permanent pain, numbness, and/or weakness of one or several areas of the body; allergic reactions; (i.e.: anaphylactic reaction); and/or death. Furthermore, the patient was informed of those risks and complications associated with the medications. These include, but are not limited to: allergic reactions (i.e.: anaphylactic or anaphylactoid reaction(s)); adrenal axis suppression; blood sugar elevation that in diabetics may result in ketoacidosis or comma; water retention that in patients with history of congestive heart failure may result in shortness of breath, pulmonary edema, and decompensation with resultant heart failure; weight gain; swelling or edema; medication-induced neural toxicity; particulate matter embolism and blood vessel occlusion with resultant organ, and/or nervous system infarction; and/or aseptic necrosis of one or more joints. Finally, the patient was informed  that Medicine is not an exact science; therefore, there is also the possibility of unforeseen or unpredictable risks and/or possible complications that may result in a catastrophic outcome. The patient indicated having understood very clearly. We have given the patient no guarantees and we have made no promises. Enough time was given to the patient to ask questions, all of which were answered to the patient's satisfaction. Sharon Vazquez has indicated that she wanted to continue with the procedure. Attestation: I, the ordering provider, attest that I have discussed with the patient the benefits, risks, side-effects, alternatives, likelihood of achieving goals, and potential problems during recovery for the procedure that I have provided informed consent. Date   Time: 01/23/2019 10:53 AM  Pre-Procedure Preparation:  Monitoring: As per clinic protocol. Respiration, ETCO2, SpO2, BP, heart rate and rhythm monitor placed and checked for adequate function Safety Precautions: Patient was assessed for positional comfort and pressure points before starting the procedure. Time-out: I initiated and conducted the "Time-out" before starting the procedure, as per protocol. The patient was asked to participate by confirming the accuracy of the "Time Out" information. Verification of the correct person, site, and procedure were performed and confirmed by me, the nursing staff, and the patient. "Time-out" conducted as per Joint Commission's Universal Protocol (UP.01.01.01). Time: 1221  Description of Procedure:          Laterality: Bilateral. The procedure was performed in identical fashion on both sides. Levels:   L3, L4, L5, & S1 Medial Branch Level(s) Area Prepped: Posterior Lumbosacral Region Prepping solution: DuraPrep (Iodine Povacrylex [0.7% available iodine] and Isopropyl Alcohol, 74% w/w) Safety Precautions: Aspiration looking for blood return was conducted prior to all injections. At no point did we inject any  substances, as a needle was being advanced. Before injecting, the patient was told to immediately notify me if she was experiencing any new onset of "ringing in the ears, or metallic taste in the mouth". No attempts were made at seeking any paresthesias. Safe injection practices and needle disposal techniques used. Medications properly checked for expiration dates. SDV (single dose vial) medications used. After the completion of the procedure, all disposable equipment used was discarded in the proper designated medical waste containers. Local Anesthesia: Protocol guidelines were followed. The patient was positioned over the fluoroscopy  table. The area was prepped in the usual manner. The time-out was completed. The target area was identified using fluoroscopy. A 12-in long, straight, sterile hemostat was used with fluoroscopic guidance to locate the targets for each level blocked. Once located, the skin was marked with an approved surgical skin marker. Once all sites were marked, the skin (epidermis, dermis, and hypodermis), as well as deeper tissues (fat, connective tissue and muscle) were infiltrated with a small amount of a short-acting local anesthetic, loaded on a 10cc syringe with a 25G, 1.5-in  Needle. An appropriate amount of time was allowed for local anesthetics to take effect before proceeding to the next step. Local Anesthetic: Lidocaine 2.0% The unused portion of the local anesthetic was discarded in the proper designated containers. Technical explanation of process:   L3 Medial Branch Nerve Block (MBB): The target area for the L3 medial branch is at the junction of the postero-lateral aspect of the superior articular process and the superior, posterior, and medial edge of the transverse process of L4. Under fluoroscopic guidance, a Quincke needle was inserted until contact was made with os over the superior postero-lateral aspect of the pedicular shadow (target area). After negative aspiration for  blood, 74mL of the nerve block solution was injected without difficulty or complication. The needle was removed intact. L4 Medial Branch Nerve Block (MBB): The target area for the L4 medial branch is at the junction of the postero-lateral aspect of the superior articular process and the superior, posterior, and medial edge of the transverse process of L5. Under fluoroscopic guidance, a Quincke needle was inserted until contact was made with os over the superior postero-lateral aspect of the pedicular shadow (target area). After negative aspiration for blood, 56mL of the nerve block solution was injected without difficulty or complication. The needle was removed intact. L5 Medial Branch Nerve Block (MBB): The target area for the L5 medial branch is at the junction of the postero-lateral aspect of the superior articular process and the superior, posterior, and medial edge of the sacral ala. Under fluoroscopic guidance, a Quincke needle was inserted until contact was made with os over the superior postero-lateral aspect of the pedicular shadow (target area). After negative aspiration for blood, 1 mL of the nerve block solution was injected without difficulty or complication. The needle was removed intact. S1 Medial Branch Nerve Block (MBB): The target area for the S1 medial branch is at the posterior and inferior 6 o'clock position of the L5-S1 facet joint. Under fluoroscopic guidance, the Quincke needle inserted for the L5 MBB was redirected until contact was made with os over the inferior and postero aspect of the sacrum, at the 6 o' clock position under the L5-S1 facet joint (Target area). After negative aspiration for blood, 1 mL of the nerve block solution was injected without difficulty or complication. The needle was removed intact.  Nerve block solution: 10 cc solution made of 8 cc of 0.2% ropivacaine, 2 cc of Decadron 10 mg/cc.  1 cc injected at each level above bilaterally.  The unused portion of the  solution was discarded in the proper designated containers. Procedural Needles: 22-gauge, 3.5-inch, Quincke needles used for all levels.  Once the entire procedure was completed, the treated area was cleaned, making sure to leave some of the prepping solution back to take advantage of its long term bactericidal properties.   Illustration of the posterior view of the lumbar spine and the posterior neural structures. Laminae of L2 through S1 are labeled. DPRL5, dorsal  primary ramus of L5; DPRS1, dorsal primary ramus of S1; DPR3, dorsal primary ramus of L3; FJ, facet (zygapophyseal) joint L3-L4; I, inferior articular process of L4; LB1, lateral branch of dorsal primary ramus of L1; IAB, inferior articular branches from L3 medial branch (supplies L4-L5 facet joint); IBP, intermediate branch plexus; MB3, medial branch of dorsal primary ramus of L3; NR3, third lumbar nerve root; S, superior articular process of L5; SAB, superior articular branches from L4 (supplies L4-5 facet joint also); TP3, transverse process of L3.  Vitals:   01/23/19 1243 01/23/19 1249 01/23/19 1258 01/23/19 1300  BP: (!) 156/103 (!) 141/107 (!) 140/109 (!) 124/98  Pulse:      Resp: 14 18 20  (!) 22  Temp:      SpO2: 100% 100% 100% 100%  Weight:      Height:         Start Time: 1221 hrs. End Time: 1243 hrs.  Imaging Guidance (Spinal):          Type of Imaging Technique: Fluoroscopy Guidance (Spinal) Indication(s): Assistance in needle guidance and placement for procedures requiring needle placement in or near specific anatomical locations not easily accessible without such assistance. Exposure Time: Please see nurses notes. Contrast: None used. Fluoroscopic Guidance: I was personally present during the use of fluoroscopy. "Tunnel Vision Technique" used to obtain the best possible view of the target area. Parallax error corrected before commencing the procedure. "Direction-depth-direction" technique used to introduce the  needle under continuous pulsed fluoroscopy. Once target was reached, antero-posterior, oblique, and lateral fluoroscopic projection used confirm needle placement in all planes. Images permanently stored in EMR. Interpretation: No contrast injected. I personally interpreted the imaging intraoperatively. Adequate needle placement confirmed in multiple planes. Permanent images saved into the patient's record.  Antibiotic Prophylaxis:   Anti-infectives (From admission, onward)   None     Indication(s): None identified  Post-operative Assessment:  Post-procedure Vital Signs:  Pulse/HCG Rate: 6680 Temp: 98.2 F (36.8 C) Resp: (!) 22 BP: (!) 124/98(patient refusing to stay any longer.  Allowed me to take one) SpO2: 100 %  EBL: None  Complications: No immediate post-treatment complications observed by team, or reported by patient.  Note: The patient tolerated the entire procedure well. A repeat set of vitals were taken after the procedure and the patient was kept under observation following institutional policy, for this type of procedure. Post-procedural neurological assessment was performed, showing return to baseline, prior to discharge. The patient was provided with post-procedure discharge instructions, including a section on how to identify potential problems. Should any problems arise concerning this procedure, the patient was given instructions to immediately contact us, at any time, without hesitation. In any case, we plan to contact the patient by telephone for a follow-up status report regarding this interventional procedure.  Comments:  No additional relevant information.   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  She has significantly increased pain during the procedures which she did not have at her previous lumbar facet block.  Patient was very tearful and had extreme pain even with local  anesthetic suggesting central sensitization and hyperalgesia.  At this point, I do have reservations about offering her lumbar facet radiofrequency ablation especially given how much pain she was experiencing during the procedure.  We will have a further discussion with the patient about this at her postprocedural follow-up.  Orders Placed This Encounter  Procedures   [facility]  C-ARM 1-60 MIN NO REPORT    Intraoperative interpretation by procedural physician at [place].    Standing Status:   Standing    Number of Occurrences:   1    Order Specific Question:   Reason for exam:    Answer:   Assistance in needle guidance and placement for procedures requiring needle placement in or near specific anatomical locations not easily accessible without such assistance.   Medications ordered for procedure: Meds ordered this encounter  Medications   midazolam (VERSED) 5 MG/5ML injection 1-2 mg    Make sure Flumazenil is available in the pyxis when using this medication. If oversedation occurs, administer 0.2 mg IV over 15 sec. If after 45 sec no response, administer 0.2 mg again over 1 min; may repeat at 1 min intervals; not to exceed 4 doses (1 mg)   fentaNYL (SUBLIMAZE) injection 25-50 mcg    Make sure Narcan is available in the pyxis when using this medication. In the event of respiratory depression (RR< 8/min): Titrate NARCAN (naloxone) in increments of 0.1 to 0.2 mg IV at 2-3 minute intervals, until desired degree of reversal.   ropivacaine (PF) 2 mg/mL (0.2%) (NAROPIN) injection 1 mL   dexamethasone (DECADRON) injection 10 mg   ropivacaine (PF) 2 mg/mL (0.2%) (NAROPIN) injection 2 mL   dexamethasone (DECADRON) injection 10 mg   orphenadrine (NORFLEX) injection 60 mg   ketorolac (TORADOL) injection 60 mg   Medications administered: We administered midazolam, fentaNYL, ropivacaine (PF) 2 mg/mL (0.2%), dexamethasone, ropivacaine (PF) 2 mg/mL (0.2%), dexamethasone,  orphenadrine, and ketorolac.  See the medical record for exact dosing, route, and time of administration.  Follow-up plan:   Return in about 6 weeks (around 03/06/2019) for Post Procedure Evaluation, virtual.      Status post lumbar facets at L3, L4, L5, S1 b/l on 12/19/2018 & 01/23/2019   Recent Visits Date Type Provider Dept  01/17/19 Office Visit Gillis Santa, MD [facility]  12/19/18 Procedure visit Gillis Santa, MD [facility]  12/12/18 Office Visit Gillis Santa, MD [facility]  Showing recent visits within past 90 days and meeting all other requirements   Today's Visits Date Type Provider Dept  01/23/19 Procedure visit Gillis Santa, MD [facility]  Showing today's visits and meeting all other requirements   Future Appointments Date Type Provider Dept  02/28/19 Appointment Gillis Santa, MD [facility]  Showing future appointments within next 90 days and meeting all other requirements   Disposition: Discharge home  Discharge Date & Time: 01/23/2019;   hrs.   Primary Care Physician: Dianne Dun, MD Location: [facility] Note by: Gillis Santa, MD Date: 01/23/2019; Time: 4:33 PM  Disclaimer:  Medicine is not an exact science. The only guarantee in medicine is that nothing is guaranteed. It is important to note that the decision to proceed with this intervention was based on the information collected from the patient. The Data and conclusions were drawn from the patient's questionnaire, the interview, and the physical examination. Because the information was provided in large part by the patient, it cannot be guaranteed that it has not been purposely or unconsciously manipulated. Every effort has been made to obtain as much relevant data as possible for this evaluation. It is important to note that the conclusions that lead to this procedure are derived in large part from the available data. Always take  into account that the treatment will also be dependent on availability of resources  and existing treatment guidelines, considered by other Pain Management Practitioners as being common knowledge and practice, at the time of the intervention. For Medico-Legal purposes, it is also important to point out that variation in procedural techniques and pharmacological choices are the acceptable norm. The indications, contraindications, technique, and results of the above procedure should only be interpreted and judged by a Board-Certified Interventional Pain Specialist with extensive familiarity and expertise in the same exact procedure and technique.

## 2019-01-23 NOTE — Patient Instructions (Signed)

## 2019-01-23 NOTE — Progress Notes (Addendum)
Safety precautions to be maintained throughout the outpatient stay will include: orient to surroundings, keep bed in low position, maintain call bell within reach at all times, provide assistance with transfer out of bed and ambulation. Patient crying, refusing to take any suggestions for comfort.  Will not lie back in bed, refusing to wear CO2 monitor.  Calling her mom on phone and wanting to leave right now.  Attempted to reassure patient, offered her drink and snack, but she refused everything.  States she is hurting worse and does not want any help.  Will not hold still for Blood pressure to take.  Continues to cry. Unconsolable.  Feels like someone is stabbing her in the back.  States it is the same on both sides.  Refusing ice packs for comfort.    Wanted to go to the bathroom.  Refusing to have assistance, jerking away from staff, beligerant, says she is not staying to discuss with Dr Holley Raring.  VJanit Bern sitting down with patient talking to her whild we await for Dr Holley Raring.

## 2019-02-11 ENCOUNTER — Ambulatory Visit (INDEPENDENT_AMBULATORY_CARE_PROVIDER_SITE_OTHER)
Admission: RE | Admit: 2019-02-11 | Discharge: 2019-02-11 | Disposition: A | Discharge status: Home / Self Care | Payer: Medicaid Other | Source: Ambulatory Visit

## 2019-02-11 DIAGNOSIS — M545 Low back pain, unspecified: Secondary | ICD-10-CM

## 2019-02-11 DIAGNOSIS — M542 Cervicalgia: Secondary | ICD-10-CM

## 2019-02-11 MED ORDER — PREDNISONE 20 MG PO TABS: 20.0000 mg | ORAL_TABLET | Freq: Two times a day (BID) | ORAL | 0 refills | Status: AC

## 2019-02-11 NOTE — Discharge Instructions (Signed)
Rest, ice and heat as needed Ensure adequate stretching as tolerated. Prescribed prednisone as directed and to completion Continue with flexeril as needed at bedtime for muscle spasm.  Do not drive or operate heavy machinery while taking this medication Expect some increased pain in the next 1-3 days.  It may take 3-4 weeks for complete resolution of symptoms Will f/u with her doctor or here if not seeing significant improvement within one week. Follow up in person or go to ER if you have any new or worsening symptoms such as numbness/tingling of the inner thighs, loss of bladder or bowel control, headache/blurry vision, nausea/vomiting, confusion/altered mental status, dizziness, weakness, passing out, imbalance, etc..Marland Kitchen
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
## 2019-02-12 ENCOUNTER — Telehealth: Payer: Self-pay | Admitting: Student in an Organized Health Care Education/Training Program

## 2019-02-12 DIAGNOSIS — F17200 Nicotine dependence, unspecified, uncomplicated: Secondary | ICD-10-CM | POA: Insufficient documentation

## 2019-02-12 DIAGNOSIS — Z720 Tobacco use: Secondary | ICD-10-CM | POA: Insufficient documentation

## 2019-02-12 NOTE — Telephone Encounter (Signed)
Was in [place] 02-09-19/ had Virtual Visit with Urgent Care yesterday 02-11-19. They put her on prenisone 20 mg 1 2x day / 5 days.   She saw PCP today and received Flu shot.

## 2019-02-15 ENCOUNTER — Other Ambulatory Visit: Payer: Self-pay | Admitting: Allergy

## 2019-02-15 DIAGNOSIS — J4540 Moderate persistent asthma, uncomplicated: Secondary | ICD-10-CM

## 2019-02-27 ENCOUNTER — Telehealth: Payer: Self-pay | Admitting: *Deleted

## 2019-02-27 NOTE — Telephone Encounter (Signed)
Attempted to call for pre appointment allergy/med review. Message left.

## 2019-02-28 ENCOUNTER — Ambulatory Visit
Payer: Medicaid Other | Attending: Student in an Organized Health Care Education/Training Program | Admitting: Student in an Organized Health Care Education/Training Program

## 2019-02-28 ENCOUNTER — Encounter: Payer: Self-pay | Admitting: Student in an Organized Health Care Education/Training Program

## 2019-02-28 ENCOUNTER — Other Ambulatory Visit: Payer: Self-pay

## 2019-02-28 DIAGNOSIS — T85192A Other mechanical complication of implanted electronic neurostimulator (electrode) of spinal cord, initial encounter: Secondary | ICD-10-CM | POA: Insufficient documentation

## 2019-02-28 DIAGNOSIS — M961 Postlaminectomy syndrome, not elsewhere classified: Secondary | ICD-10-CM

## 2019-02-28 DIAGNOSIS — M47816 Spondylosis without myelopathy or radiculopathy, lumbar region: Secondary | ICD-10-CM | POA: Diagnosis not present

## 2019-02-28 DIAGNOSIS — Z981 Arthrodesis status: Secondary | ICD-10-CM | POA: Diagnosis not present

## 2019-02-28 DIAGNOSIS — T85192S Other mechanical complication of implanted electronic neurostimulator (electrode) of spinal cord, sequela: Secondary | ICD-10-CM

## 2019-02-28 DIAGNOSIS — M5416 Radiculopathy, lumbar region: Secondary | ICD-10-CM

## 2019-02-28 DIAGNOSIS — G8929 Other chronic pain: Secondary | ICD-10-CM

## 2019-02-28 MED ORDER — CYCLOBENZAPRINE HCL 10 MG PO TABS: 10.0000 mg | ORAL_TABLET | Freq: Two times a day (BID) | ORAL | 1 refills | Status: DC | PRN
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
## 2019-03-05 ENCOUNTER — Other Ambulatory Visit: Payer: Self-pay

## 2019-03-05 DIAGNOSIS — Z20822 Contact with and (suspected) exposure to covid-19: Secondary | ICD-10-CM

## 2019-03-06 LAB — NOVEL CORONAVIRUS, NAA: SARS-CoV-2, NAA: NOT DETECTED

## 2019-03-15 ENCOUNTER — Ambulatory Visit (INDEPENDENT_AMBULATORY_CARE_PROVIDER_SITE_OTHER): Payer: Medicaid Other

## 2019-03-15 DIAGNOSIS — J4550 Severe persistent asthma, uncomplicated: Secondary | ICD-10-CM | POA: Diagnosis not present

## 2019-03-25 DIAGNOSIS — N6452 Nipple discharge: Secondary | ICD-10-CM | POA: Insufficient documentation

## 2019-03-26 ENCOUNTER — Other Ambulatory Visit: Payer: Self-pay | Admitting: Family Medicine

## 2019-03-26 DIAGNOSIS — R928 Other abnormal and inconclusive findings on diagnostic imaging of breast: Secondary | ICD-10-CM

## 2019-03-26 DIAGNOSIS — N6452 Nipple discharge: Secondary | ICD-10-CM

## 2019-03-26 DIAGNOSIS — N644 Mastodynia: Secondary | ICD-10-CM

## 2019-04-22 ENCOUNTER — Other Ambulatory Visit: Payer: Self-pay | Admitting: Allergy

## 2019-04-30 ENCOUNTER — Other Ambulatory Visit: Payer: Self-pay

## 2019-04-30 ENCOUNTER — Ambulatory Visit
Admission: RE | Admit: 2019-04-30 | Discharge: 2019-04-30 | Disposition: A | Discharge status: Home / Self Care | Payer: Medicaid Other | Source: Ambulatory Visit | Attending: Family Medicine | Admitting: Family Medicine

## 2019-04-30 DIAGNOSIS — R928 Other abnormal and inconclusive findings on diagnostic imaging of breast: Secondary | ICD-10-CM

## 2019-04-30 DIAGNOSIS — N644 Mastodynia: Secondary | ICD-10-CM

## 2019-04-30 DIAGNOSIS — N6452 Nipple discharge: Secondary | ICD-10-CM

## 2019-04-30 MED ORDER — GADOBUTROL 1 MMOL/ML IV SOLN
8.0000 mL | Freq: Once | INTRAVENOUS | Status: AC | PRN
  Administered 2019-04-30: 8 mL via INTRAVENOUS

## 2019-05-10 ENCOUNTER — Ambulatory Visit: Payer: Self-pay

## 2019-05-10 ENCOUNTER — Ambulatory Visit (INDEPENDENT_AMBULATORY_CARE_PROVIDER_SITE_OTHER): Payer: Medicaid Other

## 2019-05-10 ENCOUNTER — Other Ambulatory Visit: Payer: Self-pay

## 2019-05-10 ENCOUNTER — Telehealth: Payer: Self-pay

## 2019-05-10 DIAGNOSIS — J4550 Severe persistent asthma, uncomplicated: Secondary | ICD-10-CM | POA: Diagnosis not present

## 2019-05-10 MED ORDER — PREDNISONE 10 MG PO TABS: ORAL_TABLET | ORAL | 0 refills | Status: DC

## 2019-05-10 NOTE — Telephone Encounter (Signed)
Called and informed patient of Dr. Jeralyn Ruths recommendation. Prednisone pack has been sent in to pharmacy.

## 2019-05-10 NOTE — Telephone Encounter (Signed)
Patient came in to get her Fasenra injection. After receiving her injection she comes back in to inform me that she has been having wheezing and coughing. She stated that she has started coughing up brown mucus. Patient is wondering if something could be called in to help. Please advise.

## 2019-05-10 NOTE — Telephone Encounter (Signed)
Patient stated that she has been wheezing for 2-3 days, however she has had a cough that comes and goes for about a week but started coughing brown mucus the last 2 days. Patient denied any fever, body aches/pain. Her temp at time of injection was 97.6. She is taking all of her asthma medications but she is not taking any over the counter. Patient stated that her symptoms feel the same as when she had bronchitis.

## 2019-05-10 NOTE — Telephone Encounter (Signed)
Ok please advise her to take Mucinex DM 1 tablet twice a day with plenty of water to help thin mucus and help with cough. Also send in a prednisone day pack (20mg  twice a day x 2 days, 10mg  twice a day x 2 days, 10mg  once a day x 1 day and stop.

## 2019-05-10 NOTE — Telephone Encounter (Signed)
How long has she been wheezing with productive cough?  Fevers, body ache/pain?  Is she taking anything over the counter like Mucinex DM to help with cough and thin mucus?   Hopefully she is taking all her asthma medications as outlined.

## 2019-05-21 ENCOUNTER — Other Ambulatory Visit: Payer: Self-pay | Admitting: Women's Health

## 2019-05-21 ENCOUNTER — Other Ambulatory Visit: Payer: Self-pay | Admitting: Allergy

## 2019-05-23 ENCOUNTER — Telehealth: Payer: Self-pay

## 2019-05-23 NOTE — Telephone Encounter (Signed)
She has a prn order for facet block in place.

## 2019-05-23 NOTE — Telephone Encounter (Signed)
She would like to get a procedure, She has pain in lower back. Last seen in September.

## 2019-06-10 ENCOUNTER — Ambulatory Visit (HOSPITAL_BASED_OUTPATIENT_CLINIC_OR_DEPARTMENT_OTHER): Payer: Medicaid Other | Admitting: Student in an Organized Health Care Education/Training Program

## 2019-06-10 ENCOUNTER — Encounter: Payer: Self-pay | Admitting: Student in an Organized Health Care Education/Training Program

## 2019-06-10 ENCOUNTER — Ambulatory Visit
Admission: RE | Admit: 2019-06-10 | Discharge: 2019-06-10 | Disposition: A | Discharge status: Home / Self Care | Payer: Medicaid Other | Source: Ambulatory Visit | Attending: Student in an Organized Health Care Education/Training Program | Admitting: Student in an Organized Health Care Education/Training Program

## 2019-06-10 ENCOUNTER — Other Ambulatory Visit: Payer: Self-pay

## 2019-06-10 DIAGNOSIS — M47816 Spondylosis without myelopathy or radiculopathy, lumbar region: Secondary | ICD-10-CM

## 2019-06-10 MED ORDER — DEXAMETHASONE SODIUM PHOSPHATE 10 MG/ML IJ SOLN
10.0000 mg | Freq: Once | INTRAMUSCULAR | Status: AC
  Administered 2019-06-10: 10 mg

## 2019-06-10 MED ORDER — DEXAMETHASONE SODIUM PHOSPHATE 10 MG/ML IJ SOLN
10.0000 mg | Freq: Once | INTRAMUSCULAR | Status: AC
  Administered 2019-06-10: 09:00:00 10 mg

## 2019-06-10 MED ORDER — FENTANYL CITRATE (PF) 100 MCG/2ML IJ SOLN
25.0000 ug | INTRAMUSCULAR | Status: DC | PRN
  Administered 2019-06-10: 50 ug via INTRAVENOUS

## 2019-06-10 MED ORDER — DEXAMETHASONE SODIUM PHOSPHATE 10 MG/ML IJ SOLN
INTRAMUSCULAR | Status: AC
  Filled 2019-06-10: qty 1

## 2019-06-10 MED ORDER — ROPIVACAINE HCL 2 MG/ML IJ SOLN
9.0000 mL | Freq: Once | INTRAMUSCULAR | Status: AC
  Administered 2019-06-10: 09:00:00 9 mL via PERINEURAL

## 2019-06-10 MED ORDER — LIDOCAINE HCL 2 % IJ SOLN
20.0000 mL | Freq: Once | INTRAMUSCULAR | Status: AC
  Administered 2019-06-10: 400 mg

## 2019-06-10 MED ORDER — ROPIVACAINE HCL 2 MG/ML IJ SOLN
INTRAMUSCULAR | Status: AC
  Filled 2019-06-10: qty 20

## 2019-06-10 MED ORDER — FENTANYL CITRATE (PF) 100 MCG/2ML IJ SOLN
INTRAMUSCULAR | Status: AC
  Filled 2019-06-10: qty 2

## 2019-06-10 MED ORDER — MIDAZOLAM HCL 5 MG/5ML IJ SOLN
1.0000 mg | INTRAMUSCULAR | Status: DC | PRN
  Administered 2019-06-10: 09:00:00 2 mg via INTRAVENOUS
  Filled 2019-06-10: qty 5

## 2019-06-10 MED ORDER — ROPIVACAINE HCL 2 MG/ML IJ SOLN
9.0000 mL | Freq: Once | INTRAMUSCULAR | Status: AC
  Administered 2019-06-10: 9 mL via PERINEURAL

## 2019-06-10 MED ORDER — IOHEXOL 180 MG/ML  SOLN: 10.0000 mL | Freq: Once | INTRAMUSCULAR | Status: DC

## 2019-06-10 MED ORDER — LIDOCAINE HCL 2 % IJ SOLN
INTRAMUSCULAR | Status: AC
  Filled 2019-06-10: qty 20

## 2019-06-10 NOTE — Progress Notes (Signed)
Patient's Name: Sharon Vazquez  MRN: [id number]  Referring Provider: Gillis Santa, MD  DOB: 1980/10/03  PCP: Dianne Dun, MD  DOS: 06/10/2019  Note by: Gillis Santa, MD  Service setting: Ambulatory outpatient  Specialty: Interventional Pain Management  Patient type: Established  Location: [facility] (AMB) Pain Management Facility  Visit type: Interventional Procedure   Primary Reason for Visit: Interventional Pain Management Treatment. CC: Back Pain (lower bilateral )  Procedure:          Anesthesia, Analgesia, Anxiolysis:  Type: Lumbar Facet, Medial Branch Block(s) #1 (2021) s/p 2 in 2020  Primary Purpose: Therapeutic Region: Posterolateral Lumbosacral Spine Level: L3, L4, L5, & S1 Medial Branch Level(s). Injecting these levels blocks the L3-4, L4-5, and L5-S1 lumbar facet joints. Laterality: Bilateral  Type: Moderate (Conscious) Sedation combined with Local Anesthesia Indication(s): Analgesia and Anxiety Route: Intravenous (IV) IV Access: Secured Sedation: Meaningful verbal contact was maintained at all times during the procedure  Local Anesthetic: Lidocaine 1-2%  Position: Prone   Indications: 1. Lumbar spondylosis    Pain Score: Pre-procedure: 3/10 Post-procedure: 0-No pain/10  Pre-op Assessment:  [name] is a 39 y.o. (year old), female patient, seen today for interventional treatment. She  has a past surgical history that includes Wisdom tooth extraction; bunion removal; Cholecystectomy (10 yrs ago); Colonoscopy (N/A, 08/22/2012); Tonsillectomy; Cholecystectomy (10+yrs ago); Tonsillectomy; Bunionectomy (Left); wisdom teeth extracted; epidural injections; Colonoscopy; Lumbar laminectomy/decompression microdiscectomy (Left, 07/03/2013); Lumbar laminectomy/decompression microdiscectomy (Left, 11/29/2013); Back surgery; Spinal cord stimulator trial (N/A, 04/23/2014); Spinal cord stimulator insertion (N/A, 06/13/2014); Spinal cord stimulator removal (N/A, 12/17/2014); Colonoscopy with  propofol (N/A, 09/25/2015); polypectomy (09/25/2015); Lumbar fusion (04/06/2016); Hemorrhoid surgery (N/A, 04/23/2018); and ORIF zygomatic fracture (Left, 04/27/2018). [name] has a current medication list which includes the following prescription(s): albuterol, albuterol, alprazolam, azelastine, budesonide-formoterol, b12 folate, cyclobenzaprine, diclofenac, epinephrine, esomeprazole, ibuprofen, ipratropium, lamotrigine, metoprolol succinate, montelukast, plecanatide, polycarbophil, prenatal, ryvent, tiotropium bromide monohydrate, aluvea, alum & mag hydroxide-simeth, benralizumab, clomiphene, olopatadine hcl, and prednisone, and the following Facility-Administered Medications: benralizumab, fentanyl, iohexol, and midazolam. Her primarily concern today is the Back Pain (lower bilateral )  Initial Vital Signs:  Pulse/HCG Rate: 78ECG Heart Rate: 68 Temp: 97.9 F (36.6 C) Resp: 16 BP: (!) 123/92 SpO2: 100 %  BMI: Estimated body mass index is 25.82 kg/m as calculated from the following:   Height as of this encounter: 5\' 6"  (1.676 m).   Weight as of this encounter: 160 lb (72.6 kg).  Risk Assessment: Allergies: Reviewed. She is allergic to bee venom; dilaudid [hydromorphone hcl]; peanut oil; senna; adhesive [tape]; latex; and bee pollen.  Allergy Precautions: None required Coagulopathies: Reviewed. None identified.  Blood-thinner therapy: None at this time Active Infection(s): Reviewed. None identified. [name] is afebrile  Site Confirmation: [name] was asked to confirm the procedure and laterality before marking the site Procedure checklist: Completed Consent: Before the procedure and under the influence of no sedative(s), amnesic(s), or anxiolytics, the patient was informed of the treatment options, risks and possible complications. To fulfill our ethical and legal obligations, as recommended by the American Medical Association's Code of Ethics, I have informed the patient of my  clinical impression; the nature and purpose of the treatment or procedure; the risks, benefits, and possible complications of the intervention; the alternatives, including doing nothing; the risk(s) and benefit(s) of the alternative treatment(s) or procedure(s); and the risk(s) and benefit(s) of doing nothing. The patient was provided information about the general risks and possible complications associated with the procedure. These may include, but are  not limited to: failure to achieve desired goals, infection, bleeding, organ or nerve damage, allergic reactions, paralysis, and death. In addition, the patient was informed of those risks and complications associated to Spine-related procedures, such as failure to decrease pain; infection (i.e.: Meningitis, epidural or intraspinal abscess); bleeding (i.e.: epidural hematoma, subarachnoid hemorrhage, or any other type of intraspinal or peri-dural bleeding); organ or nerve damage (i.e.: Any type of peripheral nerve, nerve root, or spinal cord injury) with subsequent damage to sensory, motor, and/or autonomic systems, resulting in permanent pain, numbness, and/or weakness of one or several areas of the body; allergic reactions; (i.e.: anaphylactic reaction); and/or death. Furthermore, the patient was informed of those risks and complications associated with the medications. These include, but are not limited to: allergic reactions (i.e.: anaphylactic or anaphylactoid reaction(s)); adrenal axis suppression; blood sugar elevation that in diabetics may result in ketoacidosis or comma; water retention that in patients with history of congestive heart failure may result in shortness of breath, pulmonary edema, and decompensation with resultant heart failure; weight gain; swelling or edema; medication-induced neural toxicity; particulate matter embolism and blood vessel occlusion with resultant organ, and/or nervous system infarction; and/or aseptic necrosis of one or  more joints. Finally, the patient was informed that Medicine is not an exact science; therefore, there is also the possibility of unforeseen or unpredictable risks and/or possible complications that may result in a catastrophic outcome. The patient indicated having understood very clearly. We have given the patient no guarantees and we have made no promises. Enough time was given to the patient to ask questions, all of which were answered to the patient's satisfaction. [name] has indicated that she wanted to continue with the procedure. Attestation: I, the ordering provider, attest that I have discussed with the patient the benefits, risks, side-effects, alternatives, likelihood of achieving goals, and potential problems during recovery for the procedure that I have provided informed consent. Date  Time: 06/10/2019  8:51 AM  Pre-Procedure Preparation:  Monitoring: As per clinic protocol. Respiration, ETCO2, SpO2, BP, heart rate and rhythm monitor placed and checked for adequate function Safety Precautions: Patient was assessed for positional comfort and pressure points before starting the procedure. Time-out: I initiated and conducted the "Time-out" before starting the procedure, as per protocol. The patient was asked to participate by confirming the accuracy of the "Time Out" information. Verification of the correct person, site, and procedure were performed and confirmed by me, the nursing staff, and the patient. "Time-out" conducted as per Joint Commission's Universal Protocol (UP.01.01.01). Time: 0923  Description of Procedure:          Laterality: Bilateral. The procedure was performed in identical fashion on both sides. Levels:   L3, L4, L5, & S1 Medial Branch Level(s) Area Prepped: Posterior Lumbosacral Region Prepping solution: DuraPrep (Iodine Povacrylex [0.7% available iodine] and Isopropyl Alcohol, 74% w/w) Safety Precautions: Aspiration looking for blood return was conducted prior to  all injections. At no point did we inject any substances, as a needle was being advanced. Before injecting, the patient was told to immediately notify me if she was experiencing any new onset of "ringing in the ears, or metallic taste in the mouth". No attempts were made at seeking any paresthesias. Safe injection practices and needle disposal techniques used. Medications properly checked for expiration dates. SDV (single dose vial) medications used. After the completion of the procedure, all disposable equipment used was discarded in the proper designated medical waste containers. Local Anesthesia: Protocol guidelines were followed. The patient was  positioned over the fluoroscopy table. The area was prepped in the usual manner. The time-out was completed. The target area was identified using fluoroscopy. A 12-in long, straight, sterile hemostat was used with fluoroscopic guidance to locate the targets for each level blocked. Once located, the skin was marked with an approved surgical skin marker. Once all sites were marked, the skin (epidermis, dermis, and hypodermis), as well as deeper tissues (fat, connective tissue and muscle) were infiltrated with a small amount of a short-acting local anesthetic, loaded on a 10cc syringe with a 25G, 1.5-in  Needle. An appropriate amount of time was allowed for local anesthetics to take effect before proceeding to the next step. Local Anesthetic: Lidocaine 2.0% The unused portion of the local anesthetic was discarded in the proper designated containers. Technical explanation of process:   L3 Medial Branch Nerve Block (MBB): The target area for the L3 medial branch is at the junction of the postero-lateral aspect of the superior articular process and the superior, posterior, and medial edge of the transverse process of L4. Under fluoroscopic guidance, a Quincke needle was inserted until contact was made with os over the superior postero-lateral aspect of the pedicular  shadow (target area). After negative aspiration for blood, 40mL of the nerve block solution was injected without difficulty or complication. The needle was removed intact. L4 Medial Branch Nerve Block (MBB): The target area for the L4 medial branch is at the junction of the postero-lateral aspect of the superior articular process and the superior, posterior, and medial edge of the transverse process of L5. Under fluoroscopic guidance, a Quincke needle was inserted until contact was made with os over the superior postero-lateral aspect of the pedicular shadow (target area). After negative aspiration for blood, 52mL of the nerve block solution was injected without difficulty or complication. The needle was removed intact. L5 Medial Branch Nerve Block (MBB): The target area for the L5 medial branch is at the junction of the postero-lateral aspect of the superior articular process and the superior, posterior, and medial edge of the sacral ala. Under fluoroscopic guidance, a Quincke needle was inserted until contact was made with os over the superior postero-lateral aspect of the pedicular shadow (target area). After negative aspiration for blood, 1 mL of the nerve block solution was injected without difficulty or complication. The needle was removed intact. S1 Medial Branch Nerve Block (MBB): The target area for the S1 medial branch is at the posterior and inferior 6 o'clock position of the L5-S1 facet joint. Under fluoroscopic guidance, the Quincke needle inserted for the L5 MBB was redirected until contact was made with os over the inferior and postero aspect of the sacrum, at the 6 o' clock position under the L5-S1 facet joint (Target area). After negative aspiration for blood, 1 mL of the nerve block solution was injected without difficulty or complication. The needle was removed intact.  Nerve block solution: 10 cc solution made of 8 cc of 0.2% ropivacaine, 2 cc of Decadron 10 mg/cc.  1 cc injected at each level  above bilaterally.  The unused portion of the solution was discarded in the proper designated containers. Procedural Needles: 22-gauge, 3.5-inch, Quincke needles used for all levels.  Once the entire procedure was completed, the treated area was cleaned, making sure to leave some of the prepping solution back to take advantage of its long term bactericidal properties.   Illustration of the posterior view of the lumbar spine and the posterior neural structures. Laminae of L2 through S1  are labeled. DPRL5, dorsal primary ramus of L5; DPRS1, dorsal primary ramus of S1; DPR3, dorsal primary ramus of L3; FJ, facet (zygapophyseal) joint L3-L4; I, inferior articular process of L4; LB1, lateral branch of dorsal primary ramus of L1; IAB, inferior articular branches from L3 medial branch (supplies L4-L5 facet joint); IBP, intermediate branch plexus; MB3, medial branch of dorsal primary ramus of L3; NR3, third lumbar nerve root; S, superior articular process of L5; SAB, superior articular branches from L4 (supplies L4-5 facet joint also); TP3, transverse process of L3.  Vitals:   06/10/19 0945 06/10/19 0957 06/10/19 1004 06/10/19 1008  BP: (!) 159/108 (!) 155/109  (!) 150/106  Pulse:      Resp: 17 16  14   Temp: (!) 96.9 F (36.1 C)  (!) 97.5 F (36.4 C)   TempSrc:      SpO2: 100% 100%  100%  Weight:      Height:         Start Time: 0923 hrs. End Time: 0935 hrs.  Imaging Guidance (Spinal):          Type of Imaging Technique: Fluoroscopy Guidance (Spinal) Indication(s): Assistance in needle guidance and placement for procedures requiring needle placement in or near specific anatomical locations not easily accessible without such assistance. Exposure Time: Please see nurses notes. Contrast: None used. Fluoroscopic Guidance: I was personally present during the use of fluoroscopy. "Tunnel Vision Technique" used to obtain the best possible view of the target area. Parallax error corrected before  commencing the procedure. "Direction-depth-direction" technique used to introduce the needle under continuous pulsed fluoroscopy. Once target was reached, antero-posterior, oblique, and lateral fluoroscopic projection used confirm needle placement in all planes. Images permanently stored in EMR. Interpretation: No contrast injected. I personally interpreted the imaging intraoperatively. Adequate needle placement confirmed in multiple planes. Permanent images saved into the patient's record.  Antibiotic Prophylaxis:   Anti-infectives (From admission, onward)   None     Indication(s): None identified  Post-operative Assessment:  Post-procedure Vital Signs:  Pulse/HCG Rate: 6661 Temp: (!) 97.5 F (36.4 C) Resp: 14 BP: (!) 150/106(pt has hx of HTN.  Took her bp med this am.  Will recheck) SpO2: 100 %  EBL: None  Complications: No immediate post-treatment complications observed by team, or reported by patient.  Note: The patient tolerated the entire procedure well. A repeat set of vitals were taken after the procedure and the patient was kept under observation following institutional policy, for this type of procedure. Post-procedural neurological assessment was performed, showing return to baseline, prior to discharge. The patient was provided with post-procedure discharge instructions, including a section on how to identify potential problems. Should any problems arise concerning this procedure, the patient was given instructions to immediately contact us, at any time, without hesitation. In any case, we plan to contact the patient by telephone for a follow-up status report regarding this interventional procedure.  Comments:  No additional relevant information.   Plan of Care  Orders:  Orders Placed This Encounter  Procedures  . Fluoro (C-Arm) (<60 min) (No Report)    Intraoperative interpretation by procedural physician at [facility].    Standing Status:   Standing     Number of Occurrences:   1    Order Specific Question:   Reason for exam:    Answer:   Assistance in needle guidance and placement for procedures requiring needle placement in or near specific anatomical locations not easily accessible without such assistance.   Medications ordered for  procedure: Meds ordered this encounter  Medications  . iohexol (OMNIPAQUE) 180 MG/ML injection 10 mL    Must be Myelogram-compatible. If not available, you may substitute with a water-soluble, non-ionic, hypoallergenic, myelogram-compatible radiological contrast medium.  Marland Kitchen lidocaine (XYLOCAINE) 2 % (with pres) injection 400 mg  . midazolam (VERSED) 5 MG/5ML injection 1-2 mg    Make sure Flumazenil is available in the pyxis when using this medication. If oversedation occurs, administer 0.2 mg IV over 15 sec. If after 45 sec no response, administer 0.2 mg again over 1 min; may repeat at 1 min intervals; not to exceed 4 doses (1 mg)  . fentaNYL (SUBLIMAZE) injection 25-50 mcg    Make sure Narcan is available in the pyxis when using this medication. In the event of respiratory depression (RR< 8/min): Titrate NARCAN (naloxone) in increments of 0.1 to 0.2 mg IV at 2-3 minute intervals, until desired degree of reversal.  . dexamethasone (DECADRON) injection 10 mg  . dexamethasone (DECADRON) injection 10 mg  . ropivacaine (PF) 2 mg/mL (0.2%) (NAROPIN) injection 9 mL  . ropivacaine (PF) 2 mg/mL (0.2%) (NAROPIN) injection 9 mL   Medications administered: We administered lidocaine, midazolam, fentaNYL, dexamethasone, dexamethasone, ropivacaine (PF) 2 mg/mL (0.2%), and ropivacaine (PF) 2 mg/mL (0.2%).  See the medical record for exact dosing, route, and time of administration.  Follow-up plan:   Return in about 8 weeks (around 08/05/2019) for Post Procedure Evaluation, virtual.      Status post lumbar facets at L3, L4, L5, S1 b/l on 12/19/2018 & 01/23/2019, #3 on 06/10/19   Recent Visits No visits were found meeting these  conditions.  Showing recent visits within past 90 days and meeting all other requirements   Today's Visits Date Type Provider Dept  06/10/19 Procedure visit Gillis Santa, MD [facility]  Showing today's visits and meeting all other requirements   Future Appointments Date Type Provider Dept  08/05/19 Appointment Gillis Santa, MD [facility]  Showing future appointments within next 90 days and meeting all other requirements   Disposition: Discharge home  Discharge Date & Time: 06/10/2019; 1011 hrs.   Primary Care Physician: Dianne Dun, MD Location: [facility] Note by: Gillis Santa, MD Date: 06/10/2019; Time: 10:31 AM  Disclaimer:  Medicine is not an exact science. The only guarantee in medicine is that nothing is guaranteed. It is important to note that the decision to proceed with this intervention was based on the information collected from the patient. The Data and conclusions were drawn from the patient's questionnaire, the interview, and the physical examination. Because the information was provided in large part by the patient, it cannot be guaranteed that it has not been purposely or unconsciously manipulated. Every effort has been made to obtain as much relevant data as possible for this evaluation. It is important to note that the conclusions that lead to this procedure are derived in large part from the available data. Always take into account that the treatment will also be dependent on availability of resources and existing treatment guidelines, considered by other Pain Management Practitioners as being common knowledge and practice, at the time of the intervention. For Medico-Legal purposes, it is also important to point out that variation in procedural techniques and pharmacological choices are the acceptable norm. The indications, contraindications, technique, and results of the above procedure should only be interpreted and  judged by a Board-Certified Interventional Pain Specialist with extensive familiarity and expertise in the same exact procedure and technique.

## 2019-06-10 NOTE — Patient Instructions (Signed)

## 2019-06-11 ENCOUNTER — Telehealth: Payer: Self-pay

## 2019-06-11 ENCOUNTER — Telehealth: Payer: Self-pay | Admitting: *Deleted

## 2019-06-11 NOTE — Telephone Encounter (Signed)
Post procedure phone call.  LM 

## 2019-06-11 NOTE — Telephone Encounter (Signed)
Having increased pain in the lower back today. Also BP is elevated. Advised patient that it is normal to have increased pain for few days after facet block. Advised her to apply heat periodically today. Advised her to monitor BP, see PCP or urgent care if continues to be elevated.

## 2019-06-12 ENCOUNTER — Ambulatory Visit: Payer: Medicaid Other | Admitting: Student in an Organized Health Care Education/Training Program

## 2019-06-22 ENCOUNTER — Ambulatory Visit
Admission: EM | Admit: 2019-06-22 | Discharge: 2019-06-22 | Disposition: A | Discharge status: Home / Self Care | Payer: Medicaid Other

## 2019-06-22 ENCOUNTER — Emergency Department (HOSPITAL_COMMUNITY): Payer: Medicaid Other

## 2019-06-22 ENCOUNTER — Other Ambulatory Visit: Payer: Self-pay

## 2019-06-22 ENCOUNTER — Encounter (HOSPITAL_COMMUNITY): Payer: Self-pay | Admitting: Emergency Medicine

## 2019-06-22 ENCOUNTER — Emergency Department (HOSPITAL_COMMUNITY)
Admission: EM | Admit: 2019-06-22 | Discharge: 2019-06-22 | Disposition: A | Discharge status: Home / Self Care | Payer: Medicaid Other | Attending: Emergency Medicine | Admitting: Emergency Medicine

## 2019-06-22 DIAGNOSIS — S0083XA Contusion of other part of head, initial encounter: Secondary | ICD-10-CM

## 2019-06-22 DIAGNOSIS — S098XXA Other specified injuries of head, initial encounter: Secondary | ICD-10-CM | POA: Insufficient documentation

## 2019-06-22 DIAGNOSIS — Z79899 Other long term (current) drug therapy: Secondary | ICD-10-CM | POA: Insufficient documentation

## 2019-06-22 DIAGNOSIS — S41151A Open bite of right upper arm, initial encounter: Secondary | ICD-10-CM

## 2019-06-22 DIAGNOSIS — S022XXA Fracture of nasal bones, initial encounter for closed fracture: Secondary | ICD-10-CM | POA: Diagnosis not present

## 2019-06-22 DIAGNOSIS — Y999 Unspecified external cause status: Secondary | ICD-10-CM | POA: Insufficient documentation

## 2019-06-22 DIAGNOSIS — Z87891 Personal history of nicotine dependence: Secondary | ICD-10-CM | POA: Insufficient documentation

## 2019-06-22 DIAGNOSIS — I10 Essential (primary) hypertension: Secondary | ICD-10-CM | POA: Diagnosis not present

## 2019-06-22 DIAGNOSIS — S0993XA Unspecified injury of face, initial encounter: Secondary | ICD-10-CM | POA: Diagnosis present

## 2019-06-22 DIAGNOSIS — S41101A Unspecified open wound of right upper arm, initial encounter: Secondary | ICD-10-CM | POA: Diagnosis not present

## 2019-06-22 DIAGNOSIS — Y929 Unspecified place or not applicable: Secondary | ICD-10-CM | POA: Insufficient documentation

## 2019-06-22 DIAGNOSIS — Y939 Activity, unspecified: Secondary | ICD-10-CM | POA: Insufficient documentation

## 2019-06-22 DIAGNOSIS — W503XXA Accidental bite by another person, initial encounter: Secondary | ICD-10-CM

## 2019-06-22 DIAGNOSIS — S0990XA Unspecified injury of head, initial encounter: Secondary | ICD-10-CM

## 2019-06-22 DIAGNOSIS — J45909 Unspecified asthma, uncomplicated: Secondary | ICD-10-CM | POA: Diagnosis not present

## 2019-06-22 DIAGNOSIS — S60211A Contusion of right wrist, initial encounter: Secondary | ICD-10-CM | POA: Diagnosis not present

## 2019-06-22 MED ORDER — IBUPROFEN 400 MG PO TABS
600.0000 mg | ORAL_TABLET | Freq: Once | ORAL | Status: AC
  Administered 2019-06-22: 13:00:00 600 mg via ORAL
  Filled 2019-06-22: qty 2

## 2019-06-22 MED ORDER — ACETAMINOPHEN 500 MG PO TABS
1000.0000 mg | ORAL_TABLET | Freq: Once | ORAL | Status: AC
  Administered 2019-06-22: 1000 mg via ORAL
  Filled 2019-06-22: qty 2

## 2019-06-22 MED ORDER — AMOXICILLIN-POT CLAVULANATE 875-125 MG PO TABS: 1.0000 | ORAL_TABLET | Freq: Two times a day (BID) | ORAL | 0 refills | Status: DC
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
## 2019-06-22 NOTE — Discharge Instructions (Addendum)
Use ice, Tylenol and ibuprofen as needed for pain. Follow-up closely with your primary doctor as needed.  Take your home medications as prescribed. Take antibiotics as prescribed and return for spreading redness to the arm, fevers, vomiting or new concerns. Use ice on your nose, try to avoid sneezing/ blowing your nose, you have a small break in your nose that will heal on its own over the next few weeks.
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
## 2019-06-27 ENCOUNTER — Other Ambulatory Visit: Payer: Self-pay | Admitting: Student in an Organized Health Care Education/Training Program

## 2019-06-27 ENCOUNTER — Other Ambulatory Visit: Payer: Self-pay | Admitting: Women's Health

## 2019-07-02 ENCOUNTER — Telehealth: Payer: Self-pay | Admitting: *Deleted

## 2019-07-05 ENCOUNTER — Ambulatory Visit (INDEPENDENT_AMBULATORY_CARE_PROVIDER_SITE_OTHER): Payer: Medicaid Other

## 2019-07-05 ENCOUNTER — Encounter: Payer: Self-pay | Admitting: Allergy & Immunology

## 2019-07-05 ENCOUNTER — Other Ambulatory Visit: Payer: Self-pay | Admitting: Allergy

## 2019-07-05 ENCOUNTER — Ambulatory Visit: Payer: Self-pay

## 2019-07-05 DIAGNOSIS — J4550 Severe persistent asthma, uncomplicated: Secondary | ICD-10-CM

## 2019-07-10 ENCOUNTER — Telehealth: Payer: Self-pay | Admitting: *Deleted

## 2019-07-10 ENCOUNTER — Telehealth: Payer: Self-pay | Admitting: Student in an Organized Health Care Education/Training Program

## 2019-07-10 NOTE — Telephone Encounter (Signed)
Attempted to reach patient again.  Voicemail left.

## 2019-07-10 NOTE — Telephone Encounter (Signed)
Pt called stating she was returning a nurse call to go over her medications before her appt tomorrow.

## 2019-07-11 ENCOUNTER — Encounter: Payer: Self-pay | Admitting: Student in an Organized Health Care Education/Training Program

## 2019-07-11 ENCOUNTER — Other Ambulatory Visit: Payer: Self-pay

## 2019-07-11 ENCOUNTER — Ambulatory Visit
Payer: Medicaid Other | Attending: Student in an Organized Health Care Education/Training Program | Admitting: Student in an Organized Health Care Education/Training Program

## 2019-07-11 DIAGNOSIS — M5416 Radiculopathy, lumbar region: Secondary | ICD-10-CM

## 2019-07-11 DIAGNOSIS — M47816 Spondylosis without myelopathy or radiculopathy, lumbar region: Secondary | ICD-10-CM

## 2019-07-11 DIAGNOSIS — M961 Postlaminectomy syndrome, not elsewhere classified: Secondary | ICD-10-CM

## 2019-07-11 DIAGNOSIS — G8929 Other chronic pain: Secondary | ICD-10-CM

## 2019-07-11 DIAGNOSIS — T85192S Other mechanical complication of implanted electronic neurostimulator (electrode) of spinal cord, sequela: Secondary | ICD-10-CM

## 2019-07-11 DIAGNOSIS — Z981 Arthrodesis status: Secondary | ICD-10-CM | POA: Diagnosis not present

## 2019-07-11 DIAGNOSIS — M5136 Other intervertebral disc degeneration, lumbar region: Secondary | ICD-10-CM

## 2019-07-11 MED ORDER — GABAPENTIN 300 MG PO CAPS: 300.0000 mg | ORAL_CAPSULE | Freq: Three times a day (TID) | ORAL | 2 refills | Status: DC

## 2019-07-11 MED ORDER — CYCLOBENZAPRINE HCL 10 MG PO TABS: 10.0000 mg | ORAL_TABLET | Freq: Two times a day (BID) | ORAL | 2 refills | Status: DC | PRN
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
## 2019-07-16 ENCOUNTER — Telehealth (INDEPENDENT_AMBULATORY_CARE_PROVIDER_SITE_OTHER): Payer: Self-pay | Admitting: Internal Medicine

## 2019-07-16 NOTE — Telephone Encounter (Signed)
Patient called would like a note to take to work stating with her IBS she will be making several trips to the restroom - would like to pike up by the end of this week - ph# [phone]

## 2019-07-18 ENCOUNTER — Encounter (INDEPENDENT_AMBULATORY_CARE_PROVIDER_SITE_OTHER): Payer: Self-pay

## 2019-07-18 ENCOUNTER — Encounter (INDEPENDENT_AMBULATORY_CARE_PROVIDER_SITE_OTHER): Payer: Self-pay | Admitting: *Deleted

## 2019-07-18 NOTE — Telephone Encounter (Signed)
This has been completed and we are awaiting Dr.Rehman's approval. Once we get this patient will be called and made aware that her letter is ready.

## 2019-07-19 ENCOUNTER — Encounter (INDEPENDENT_AMBULATORY_CARE_PROVIDER_SITE_OTHER): Payer: Self-pay

## 2019-07-23 ENCOUNTER — Encounter (INDEPENDENT_AMBULATORY_CARE_PROVIDER_SITE_OTHER): Payer: Self-pay | Admitting: Internal Medicine

## 2019-07-23 ENCOUNTER — Ambulatory Visit (INDEPENDENT_AMBULATORY_CARE_PROVIDER_SITE_OTHER): Payer: Medicaid Other | Admitting: Internal Medicine

## 2019-07-23 ENCOUNTER — Other Ambulatory Visit: Payer: Self-pay

## 2019-07-23 VITALS — BP 115/77 | HR 76 | Temp 97.3°F | Ht 67.0 in | Wt 167.8 lb

## 2019-07-23 DIAGNOSIS — K219 Gastro-esophageal reflux disease without esophagitis: Secondary | ICD-10-CM | POA: Diagnosis not present

## 2019-07-23 DIAGNOSIS — K5909 Other constipation: Secondary | ICD-10-CM

## 2019-07-23 MED ORDER — ESOMEPRAZOLE MAGNESIUM 40 MG PO CPDR: 40.0000 mg | DELAYED_RELEASE_CAPSULE | Freq: Every day | ORAL | 3 refills | Status: DC

## 2019-07-23 MED ORDER — TRULANCE 3 MG PO TABS: 3.0000 mg | ORAL_TABLET | Freq: Every day | ORAL | 5 refills | Status: DC | PRN

## 2019-07-23 NOTE — Patient Instructions (Signed)
Can try esomeprazole every other day.  If every other day schedule does not control heartburn can go back to taking it every day.

## 2019-07-23 NOTE — Progress Notes (Signed)
Presenting complaint;  Follow for chronic GERD and constipation.  Database and subjective:  Patient is 39 year old F American female who is here for scheduled visit.  She was last seen on 01/15/2019. She states she is doing well as far as her GI system is concerned.  She is having non-GI issues. She says she rarely has heartburn.  She denies dysphagia.  She had an episode of nausea last week without vomiting.  She is not having 2-3 bowel movements per day.  She feels Trulance has worked better than other medication that she has taken.  She says her hemorrhoids have resolved.  She is not having any rectal bleeding or abdominal pain. She has lost 13 pounds since her last visit.  However she has lost 75 pounds since her visit of July 2018.  In the preceding few years she had gained 100 pounds.  She states her goal is to get down to 160 pounds.  She is watching her diet.  She feels weight loss is occurred because she is very active.  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  She walks close to 4 miles on days when she is working. She continues to experience low back pain.  She has had injections to her back.  She says pain radiates into her left leg.  She has an appointment to follow-up with her back specialist. She states she is now engaged. She needs new prescription for Trulance/plecanatide.  Current Medications: Outpatient Encounter Medications as of 07/23/2019  Medication Sig  . albuterol (PROVENTIL) (2.5 MG/3ML) 0.083% nebulizer solution Take 3 mLs (2.5 mg total) by nebulization every 4 (four) hours as needed for wheezing or shortness of breath.  Marland Kitchen albuterol (VENTOLIN HFA) 108 (90 Base) MCG/ACT inhaler INHALE 1-2 PUFFS EVERY 6 HOURS AS NEEDED FOR WHEEZING.  . ALPRAZolam (XANAX XR) 2 MG 24 hr tablet Take 2 mg by mouth 2 (two) times daily.   Marland Kitchen azelastine (OPTIVAR) 0.05 % ophthalmic solution Place 1 drop into both eyes 2 (two) times daily as needed (itchy, watery, red eyes).   . Benralizumab (FASENRA) 30 MG/ML SOSY Inject 30 mg into the skin every 30 (thirty) days.  . budesonide-formoterol (SYMBICORT) 160-4.5 MCG/ACT inhaler Inhale 2 puffs into the lungs 2 (two) times daily.  . Cobalamin Combinations (B12 FOLATE) 800-800 MCG CAPS Take 1 capsule by mouth daily.  . cyclobenzaprine (FLEXERIL) 10 MG tablet Take 1 tablet (10 mg total) by mouth 2 (two) times daily as needed for muscle spasms.  . diclofenac (VOLTAREN) 75 MG EC tablet Take 75 mg by mouth 2 (two) times daily. Take 1 tablet by mouth two times daily after meals for inflammation, pain, and swelling.   Marland Kitchen EPINEPHrine (EPIPEN 2-PAK) 0.3 mg/0.3 mL IJ SOAJ injection Inject 0.3 mg into the muscle once.  Marland Kitchen esomeprazole (NEXIUM) 40 MG capsule Take 1 capsule (40 mg total) by mouth daily before breakfast.  . gabapentin (NEURONTIN) 300 MG capsule Take 1 capsule (300 mg total) by mouth 3 (three) times daily.  Marland Kitchen ibuprofen (ADVIL,MOTRIN) 600 MG tablet Take 1 tablet (600 mg total) by mouth every 6 (six) hours as needed. (Patient taking differently: Take 800 mg by mouth every 6 (six) hours as needed. )  . ipratropium (ATROVENT) 0.06 % nasal spray Place 2 sprays into both nostrils 3 (three) times daily.  Marland Kitchen lamoTRIgine (LAMICTAL) 150 MG tablet Take 150 mg by mouth daily.   . metoprolol succinate (TOPROL-XL) 100 MG 24 hr tablet Take 100 mg by  mouth daily.   . montelukast (SINGULAIR) 10 MG tablet TAKE (1) TABLET BY MOUTH AT BEDTIME.  Marland Kitchen Plecanatide (TRULANCE) 3 MG TABS Take 3 mg by mouth daily as needed.  . polycarbophil (FIBERCON) 625 MG tablet Take 2 tablets (1,250 mg total) by mouth daily.  . Prenatal Vit-Fe Fumarate-FA (M-NATAL PLUS) 27-1 MG TABS TAKE 1 TABLET ONCE DAILY AT 12 NOON  . RYVENT 6 MG TABS TAKE 1 TABLET BY MOUTH TWICE DAILY.  Marland Kitchen Tiotropium Bromide Monohydrate (SPIRIVA RESPIMAT) 1.25 MCG/ACT AERS Inhale 2 puffs into the lungs daily.  . clomiPHENE (CLOMID) 50 MG tablet TAKE 1 TABLET BY MOUTH ONCE DAILY ON CYCLE DAYS 3 7   . Urea (ALUVEA) 39 % CREA Apply 1 application topically daily as needed (to heels).   . [DISCONTINUED] amoxicillin-clavulanate (AUGMENTIN) 875-125 MG tablet Take 1 tablet by mouth 2 (two) times daily. One po bid x 7 days (Patient not taking: Reported on 07/23/2019)   Facility-Administered Encounter Medications as of 07/23/2019  Medication  . Benralizumab SOSY 30 mg    Objective: Blood pressure 115/77, pulse 76, temperature (!) 97.3 F (36.3 C), temperature source Temporal, height 5\' 7"  (1.702 m), weight 167 lb 12.8 oz (76.1 kg). Patient is alert and in no acute distress. She is wearing a facial mask. Conjunctiva is pink. Sclera is nonicteric Oropharyngeal mucosa is normal. No neck masses or thyromegaly noted. Cardiac exam with regular rhythm normal S1 and S2. No murmur or gallop noted. Lungs are clear to auscultation. Abdomen is full but soft and nontender with organomegaly or masses. No LE edema or clubbing noted.  Lab data from 01/16/2019  HCV antibody nonreactive  Assessment  #1.  Chronic GERD.  She is doing well with therapy.  She is not having any heartburn.  I believe this is due to dietary changes and significant weight loss over the last 2-1/2 years.  She can try PPI every other day and see if it works.  In the long run it would be desirable if she can be maintained on low-dose.  #2.  Chronic constipation.  She is doing much better with Trulance/plecanatide tied.  She can try using it on as-needed basis rather than every day.  However will not change the prescription at this time.  #3.  History of colonic polyps.  She had her first colonoscopy in March 2014 for hematochezia.  She had 10 mm cecal sessile serrated polyp with dysplasia.  Follow-up colonoscopy in April 2017 revealed small tubular adenoma.  Next colonoscopy would be in April 2022.   Plan:  New prescription for Trulance/Plaquenil type sent to patient's pharmacy with multiple refills. She will try esomeprazole 40  mg every other day.  If this schedule does not work she can go back to taking it every day. Patient was also given a note for work with her bowel issues as she may have an urgency and may have to use bathroom on a short notice. Office visit in 1 year.

## 2019-07-24 ENCOUNTER — Other Ambulatory Visit: Payer: Self-pay | Admitting: Allergy

## 2019-07-26 ENCOUNTER — Ambulatory Visit: Payer: Self-pay | Admitting: Allergy

## 2019-07-26 ENCOUNTER — Ambulatory Visit: Payer: Medicaid Other | Admitting: Family Medicine

## 2019-08-01 ENCOUNTER — Encounter: Payer: Self-pay | Admitting: Student in an Organized Health Care Education/Training Program

## 2019-08-05 ENCOUNTER — Encounter: Payer: Self-pay | Admitting: Student in an Organized Health Care Education/Training Program

## 2019-08-05 ENCOUNTER — Ambulatory Visit
Payer: Medicaid Other | Attending: Student in an Organized Health Care Education/Training Program | Admitting: Student in an Organized Health Care Education/Training Program

## 2019-08-05 ENCOUNTER — Other Ambulatory Visit: Payer: Self-pay

## 2019-08-05 DIAGNOSIS — T85192S Other mechanical complication of implanted electronic neurostimulator (electrode) of spinal cord, sequela: Secondary | ICD-10-CM

## 2019-08-05 DIAGNOSIS — Z981 Arthrodesis status: Secondary | ICD-10-CM | POA: Diagnosis not present

## 2019-08-05 DIAGNOSIS — M5416 Radiculopathy, lumbar region: Secondary | ICD-10-CM

## 2019-08-05 DIAGNOSIS — M47816 Spondylosis without myelopathy or radiculopathy, lumbar region: Secondary | ICD-10-CM

## 2019-08-05 DIAGNOSIS — M961 Postlaminectomy syndrome, not elsewhere classified: Secondary | ICD-10-CM

## 2019-08-05 DIAGNOSIS — G8929 Other chronic pain: Secondary | ICD-10-CM

## 2019-08-05 MED ORDER — GABAPENTIN 300 MG PO CAPS: 600.0000 mg | ORAL_CAPSULE | Freq: Three times a day (TID) | ORAL | 2 refills | Status: DC

## 2019-08-05 MED ORDER — DICLOFENAC SODIUM 75 MG PO TBEC: 75.0000 mg | DELAYED_RELEASE_TABLET | Freq: Two times a day (BID) | ORAL | 2 refills | Status: DC | PRN

## 2019-08-05 MED ORDER — CYCLOBENZAPRINE HCL 10 MG PO TABS: 10.0000 mg | ORAL_TABLET | Freq: Two times a day (BID) | ORAL | 2 refills | Status: DC | PRN
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
## 2019-08-07 ENCOUNTER — Telehealth: Payer: Self-pay | Admitting: *Deleted

## 2019-08-07 MED ORDER — DIAZEPAM 5 MG PO TABS: 5.0000 mg | ORAL_TABLET | Freq: Once | ORAL | 0 refills | Status: DC | PRN

## 2019-08-07 NOTE — Telephone Encounter (Signed)
Called and instructed patient that medication was sent in the pharm. For her MRI. Patient with understanding

## 2019-08-07 NOTE — Telephone Encounter (Signed)
Would you send in medication for this patient. I will be glad to call and inform her. Thanks

## 2019-08-15 ENCOUNTER — Telehealth: Payer: Self-pay | Admitting: Student in an Organized Health Care Education/Training Program
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
## 2019-08-15 NOTE — Telephone Encounter (Signed)
Please call the MRI department and make sure it is cancelled.

## 2019-08-16 ENCOUNTER — Ambulatory Visit: Admission: RE | Admit: 2019-08-16 | Payer: Medicaid Other | Source: Ambulatory Visit

## 2019-08-16 ENCOUNTER — Ambulatory Visit: Payer: Medicaid Other

## 2019-08-20 ENCOUNTER — Other Ambulatory Visit: Payer: Self-pay | Admitting: Allergy

## 2019-08-23 ENCOUNTER — Other Ambulatory Visit: Payer: Self-pay

## 2019-08-23 ENCOUNTER — Telehealth: Payer: Self-pay | Admitting: Allergy

## 2019-08-23 ENCOUNTER — Encounter: Payer: Self-pay | Admitting: Allergy

## 2019-08-23 ENCOUNTER — Telehealth: Payer: Self-pay | Admitting: *Deleted

## 2019-08-23 ENCOUNTER — Ambulatory Visit (INDEPENDENT_AMBULATORY_CARE_PROVIDER_SITE_OTHER): Payer: Medicaid Other | Admitting: Allergy

## 2019-08-23 VITALS — BP 146/86 | HR 80 | Temp 97.7°F | Resp 18

## 2019-08-23 DIAGNOSIS — T6391XD Toxic effect of contact with unspecified venomous animal, accidental (unintentional), subsequent encounter: Secondary | ICD-10-CM | POA: Diagnosis not present

## 2019-08-23 DIAGNOSIS — L989 Disorder of the skin and subcutaneous tissue, unspecified: Secondary | ICD-10-CM

## 2019-08-23 DIAGNOSIS — H1013 Acute atopic conjunctivitis, bilateral: Secondary | ICD-10-CM

## 2019-08-23 DIAGNOSIS — J4540 Moderate persistent asthma, uncomplicated: Secondary | ICD-10-CM

## 2019-08-23 DIAGNOSIS — J3089 Other allergic rhinitis: Secondary | ICD-10-CM | POA: Diagnosis not present

## 2019-08-23 MED ORDER — ALBUTEROL SULFATE HFA 108 (90 BASE) MCG/ACT IN AERS: INHALATION_SPRAY | RESPIRATORY_TRACT | 1 refills | Status: DC

## 2019-08-23 MED ORDER — ALBUTEROL SULFATE (2.5 MG/3ML) 0.083% IN NEBU: 2.5000 mg | INHALATION_SOLUTION | RESPIRATORY_TRACT | 1 refills | Status: DC | PRN

## 2019-08-23 MED ORDER — EPINEPHRINE 0.3 MG/0.3ML IJ SOAJ: 0.3000 mg | Freq: Once | INTRAMUSCULAR | 1 refills | Status: AC

## 2019-08-23 MED ORDER — SPIRIVA RESPIMAT 1.25 MCG/ACT IN AERS: 2.0000 | INHALATION_SPRAY | Freq: Every day | RESPIRATORY_TRACT | 5 refills | Status: DC

## 2019-08-23 MED ORDER — TRIAMCINOLONE ACETONIDE 0.1 % EX OINT: 1.0000 "application " | TOPICAL_OINTMENT | Freq: Two times a day (BID) | CUTANEOUS | 5 refills | Status: DC | PRN

## 2019-08-23 NOTE — Telephone Encounter (Signed)
Triamcinolone has been sent in to the patient's pharmacy. Called patient and advised. Patient verbalized understanding.

## 2019-08-23 NOTE — Progress Notes (Signed)
Follow-up Note  RE: Sharon Vazquez MRN: [id number] DOB: [birth year] Date of Office Visit: 08/23/2019   History of present illness: Sharon Vazquez is a 39 y.o. female presenting today for follow-up of asthma allergic rhinitis with conjunctivitis, Hymenoptera allergy.  She was last seen in the office on November 07, 2018 by myself.  She has been doing well since her last visit without any major health changes. 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  She states she had a big flare in January where she got overheated at work and had to go outside to catch her breath and use her albuterol inhaler.  She otherwise has not needed any ED or urgent care visits or any systemic steroids.  She does continue on using her Symbicort 2 puffs twice a day as well as her Spiriva once a day.  She also is on Fasenra injections every 8 weeks at this time.  She does feel that the Berna Bue has helped to better control her asthma. With her allergies she states that since November she has been having a constant runny nose.  She does need a refill of her nasal Atrovent but states she has been using it about once a week.  She does not feel that the RyVent is providing her any benefit at this time.  She does continue to take her Singulair daily. She has not had any stinging insect stings since her last visit nor has she needed to use her epinephrine device. She does report having this tender black bump lesion on her right pad of her thumb that she states is getting bigger now.  He is not sure what it is.  She denies any trauma to the area.  She states with her job she has to work with her hands and using files to equipment.  She states she does wear a glove but that area where the lesion is seems to get irritated with her job work. She did quit smoking since her last visit! She also got  engaged!  Review of systems: Review of Systems  Constitutional: Negative.   HENT:       See HPI  Eyes: Negative.   Respiratory: Positive for cough, shortness of breath and wheezing.   Cardiovascular: Negative.   Gastrointestinal: Negative.   Musculoskeletal: Negative.   Skin: Negative.        See HPI  Neurological: Negative.     All other systems negative unless noted above in HPI  Past medical/social/surgical/family history have been reviewed and are unchanged unless specifically indicated below.  No changes  Medication List: Current Outpatient Medications  Medication Sig Dispense Refill  . albuterol (PROVENTIL) (2.5 MG/3ML) 0.083% nebulizer solution Take 3 mLs (2.5 mg total) by nebulization every 4 (four) hours as needed for wheezing or shortness of breath. 75 mL 1  . albuterol (VENTOLIN HFA) 108 (90 Base) MCG/ACT inhaler INHALE 1-2 PUFFS EVERY 6 HOURS AS NEEDED FOR WHEEZING. 8.5 g 0  . ALPRAZolam (XANAX XR) 2 MG 24 hr tablet Take 2 mg by mouth 2 (two) times daily.     Marland Kitchen azelastine (OPTIVAR) 0.05 % ophthalmic solution Place 1 drop into both eyes 2 (two) times daily as needed (itchy, watery, red eyes). 6 mL 5  . Benralizumab (FASENRA) 30 MG/ML SOSY Inject 30 mg into the skin every 30 (thirty) days.    Marland Kitchen  budesonide-formoterol (SYMBICORT) 160-4.5 MCG/ACT inhaler Inhale 2 puffs into the lungs 2 (two) times daily. 1 Inhaler 5  . Cobalamin Combinations (B12 FOLATE) 800-800 MCG CAPS Take 1 capsule by mouth daily.    . cyclobenzaprine (FLEXERIL) 10 MG tablet Take 1 tablet (10 mg total) by mouth 2 (two) times daily as needed for muscle spasms. 60 tablet 2  . diazepam (VALIUM) 5 MG tablet Take 1 tablet (5 mg total) by mouth once as needed for up to 1 dose for anxiety (prior to MRI). 1 tablet 0  . diclofenac (VOLTAREN) 75 MG EC tablet Take 1 tablet (75 mg total) by mouth 2 (two) times daily as needed for moderate pain. Take 1 tablet by mouth two times daily after meals for inflammation,  pain, and swelling. 60 tablet 2  . EPINEPHrine (EPIPEN 2-PAK) 0.3 mg/0.3 mL IJ SOAJ injection Inject 0.3 mg into the muscle once.    Marland Kitchen esomeprazole (NEXIUM) 40 MG capsule Take 1 capsule (40 mg total) by mouth daily before breakfast. 90 capsule 3  . gabapentin (NEURONTIN) 300 MG capsule Take 2 capsules (600 mg total) by mouth 3 (three) times daily. 180 capsule 2  . ibuprofen (ADVIL,MOTRIN) 600 MG tablet Take 1 tablet (600 mg total) by mouth every 6 (six) hours as needed. (Patient taking differently: Take 800 mg by mouth every 6 (six) hours as needed. ) 30 tablet 1  . ipratropium (ATROVENT) 0.06 % nasal spray Place 2 sprays into both nostrils 3 (three) times daily. 15 mL 5  . lamoTRIgine (LAMICTAL) 150 MG tablet Take 150 mg by mouth daily.     . metoprolol succinate (TOPROL-XL) 100 MG 24 hr tablet Take 100 mg by mouth daily.     . montelukast (SINGULAIR) 10 MG tablet TAKE (1) TABLET BY MOUTH AT BEDTIME. 30 tablet 0  . Plecanatide (TRULANCE) 3 MG TABS Take 3 mg by mouth daily as needed. 30 tablet 5  . polycarbophil (FIBERCON) 625 MG tablet Take 2 tablets (1,250 mg total) by mouth daily. 60 tablet 5  . Prenatal Vit-Fe Fumarate-FA (M-NATAL PLUS) 27-1 MG TABS TAKE 1 TABLET ONCE DAILY AT 12 NOON 30 tablet 12  . RYVENT 6 MG TABS TAKE 1 TABLET BY MOUTH TWICE DAILY. 60 tablet 0  . Tiotropium Bromide Monohydrate (SPIRIVA RESPIMAT) 1.25 MCG/ACT AERS Inhale 2 puffs into the lungs daily. 1 Inhaler 5  . clomiPHENE (CLOMID) 50 MG tablet TAKE 1 TABLET BY MOUTH ONCE DAILY ON CYCLE DAYS 3 7     Current Facility-Administered Medications  Medication Dose Route Frequency Provider Last Rate Last Admin  . Benralizumab SOSY 30 mg  30 mg Subcutaneous Q8 Weeks Kennith Gain, MD   30 mg at 07/05/19 1408     Known medication allergies: Allergies  Allergen Reactions  . Bee Venom Anaphylaxis  . Dilaudid [Hydromorphone Hcl] Anaphylaxis and Hives    Has tolerated morphine since this reaction with Benadryl  .  Peanut Oil Anaphylaxis  . Senna Anaphylaxis and Hives  . Adhesive [Tape] Hives and Other (See Comments)    Pulls skin off (use paper tape)  . Latex Hives     Physical examination: Blood pressure (!) 146/86, pulse 80, temperature 97.7 F (36.5 C), temperature source Temporal, resp. rate 18, SpO2 96 %.  General: Alert, interactive, in no acute distress. HEENT: PERRLA, TMs pearly gray, turbinates minimally edematous with clear discharge, post-pharynx non erythematous. Neck: Supple without lymphadenopathy. Lungs: Clear to auscultation without wheezing, rhonchi or rales. {no increased work of breathing.  CV: Normal S1, S2 without murmurs. Abdomen: Nondistended, nontender. Skin: Right thumb pad above the distal joint with a round hard immobile black appearing papule about 4 mm x 4 mm. Extremities:  No clubbing, cyanosis or edema. Neuro:   Grossly intact.  Diagnositics/Labs:  Spirometry: FEV1: 3.21L 113%, FVC: 3.94L 114%, ratio consistent with nonobstructive pattner  Assessment and plan:   Allergic rhinitis  Continue appropriate allergen avoidance measures.  Try Ryclora 42m/5ml take 16ml twice a day.  This can replace Ryvent if more effective.  Let us know if Ryclora works better for you than Ryvent.   Provided with Ryclora samples today.   Continue montelukast 10mg  daily  Use nasal Atrovent 2 sprays each nostril daily for control of nasal drainage.  Can use nasal Atrovent 3-4 times a day if needed   Nasal saline spray (i.e., Simply Saline) or nasal saline lavage (i.e., NeilMed) is recommended as needed and prior to medicated nasal sprays.  Allergic conjunctivitis  Treatment plan as outlined above for allergic rhinitis.  Use Azelastine 1 drop each eye once a twice a day for itchy/watery/red eyes  Can also use eye lubricant drops (i.e., Natural Tears) as needed.  Moderate persistent asthma  Lung function looks great today!  Continue Fasenra injections every 8  weeks  Continue Symbicort 160-4.5 g, 2 inhalations twice daily   Use Spiriva  2 inhalations once a day (take midday)  have access to albuterol inhaler 2 puffs every 4-6 hours as needed for cough/wheeze/shortness of breath/chest tightness.  May use 15-20 minutes prior to activity.   Monitor frequency of use.    To maximize pulmonary deposition, use Symbicort and your albuterol inhaler with spacer that has been provided previously  Asthma control goals:   Full participation in all desired activities (may need albuterol before activity)  Albuterol use two time or less a week on average (not counting use with activity)  Cough interfering with sleep two time or less a month  Oral steroids no more than once a year  No hospitalizations  Hymenoptera allergy  Continue avoidance measures to prevent getting stung and have access to epinephrine device (Epipen).  Thumb lesion  Will place dermatology referral for evaluation  Try your best not to irritate the area  Congratulations on quitting smoking!!  Return in about 6 months or sooner if needed  I appreciate the opportunity to take part in Zayah's care. Please do not hesitate to contact me with questions.  Sincerely,   Prudy Feeler, MD Allergy/Immunology [facility]

## 2019-08-23 NOTE — Addendum Note (Signed)
Addended by: Chip Boer R on: 08/23/2019 03:38 PM   Modules accepted: Orders

## 2019-08-23 NOTE — Telephone Encounter (Signed)
Per Dr. Nelva Bush she would like for the patient to be referred to Dermatology for a thumb lesion that is hard, immobile, and enlarging tender mass.

## 2019-08-23 NOTE — Telephone Encounter (Signed)
Patient called and said that Dr. Nelva Bush was going to call in something for her eczema. [place]. [phone]

## 2019-08-23 NOTE — Patient Instructions (Addendum)
Allergic rhinitis  Continue appropriate allergen avoidance measures.  Try Ryclora 85m/5ml take 42ml twice a day.  This can replace Ryvent if more effective.  Let us know if Ryclora works better for you than Ryvent.   Provided with Ryclora samples today.   Continue montelukast 10mg  daily  Use nasal Atrovent 2 sprays each nostril daily for control of nasal drainage.  Can use nasal Atrovent 3-4 times a day if needed   Nasal saline spray (i.e., Simply Saline) or nasal saline lavage (i.e., NeilMed) is recommended as needed and prior to medicated nasal sprays.  Allergic conjunctivitis  Treatment plan as outlined above for allergic rhinitis.  Use Azelastine 1 drop each eye once a twice a day for itchy/watery/red eyes  Can also use eye lubricant drops (i.e., Natural Tears) as needed.  Moderate persistent asthma  Lung function looks great today!  Continue Fasenra injections every 8 weeks  Continue Symbicort 160-4.5 g, 2 inhalations twice daily   Use Spiriva  2 inhalations once a day (take midday)  have access to albuterol inhaler 2 puffs every 4-6 hours as needed for cough/wheeze/shortness of breath/chest tightness.  May use 15-20 minutes prior to activity.   Monitor frequency of use.    To maximize pulmonary deposition, use Symbicort and your albuterol inhaler with spacer that has been provided previously  Asthma control goals:   Full participation in all desired activities (may need albuterol before activity)  Albuterol use two time or less a week on average (not counting use with activity)  Cough interfering with sleep two time or less a month  Oral steroids no more than once a year  No hospitalizations  Hymenoptera allergy  Continue avoidance measures to prevent getting stung and have access to epinephrine device (Epipen).  Thumb lesion  Will place dermatology referral for evaluation  Try your best not to irritate the area  Congratulations on quitting  smoking!!  Will mail letter for work to your home.    Return in about 6 months or sooner if needed

## 2019-08-26 NOTE — Telephone Encounter (Signed)
I have placed a referral to [facility] due to the patient having Medicaid.  They will contact the patient to schedule.  I left her a voicemail with this information  [place] Location  Phone [phone] (press1) Fax [phone]  [facility] Location  Phone [phone] (press 2) or Toll Free [phone]

## 2019-08-27 ENCOUNTER — Ambulatory Visit
Admission: RE | Admit: 2019-08-27 | Discharge: 2019-08-27 | Disposition: A | Discharge status: Home / Self Care | Payer: Medicaid Other | Source: Ambulatory Visit | Attending: Student in an Organized Health Care Education/Training Program | Admitting: Student in an Organized Health Care Education/Training Program

## 2019-08-27 ENCOUNTER — Other Ambulatory Visit: Payer: Self-pay

## 2019-08-27 DIAGNOSIS — M5416 Radiculopathy, lumbar region: Secondary | ICD-10-CM | POA: Diagnosis not present

## 2019-08-27 LAB — POCT I-STAT CREATININE: Creatinine, Ser: 1 mg/dL (ref 0.44–1.00)

## 2019-08-27 MED ORDER — GADOBUTROL 1 MMOL/ML IV SOLN
7.0000 mL | Freq: Once | INTRAVENOUS | Status: AC | PRN
  Administered 2019-08-27: 7 mL via INTRAVENOUS

## 2019-08-27 NOTE — Telephone Encounter (Signed)
Agreed. I am not sure why there isn't any Dermatologist who take Medicaid around. I even called to [facility].   Thanks

## 2019-08-27 NOTE — Telephone Encounter (Signed)
Patient called stating Dr Nevada Crane does not see MCD patients now. I have called around to multiple offices to see who could take the patients insurance. I called [facility] and they do take Adult MCD but will have to have a referral from the PCP instead of a specialist.  I called and informed the patient of this information. She is going to call her PCP and have them to send her over to [facility]. They may have someone in mind that they refer patients to that I may have missed.   Thanks

## 2019-08-27 NOTE — Telephone Encounter (Signed)
Ok how frustrating for all involved.  Thanks for your work on this.

## 2019-08-29 ENCOUNTER — Encounter: Payer: Self-pay | Admitting: Student in an Organized Health Care Education/Training Program

## 2019-08-29 ENCOUNTER — Ambulatory Visit
Payer: Medicaid Other | Attending: Student in an Organized Health Care Education/Training Program | Admitting: Student in an Organized Health Care Education/Training Program

## 2019-08-29 ENCOUNTER — Other Ambulatory Visit: Payer: Self-pay

## 2019-08-29 DIAGNOSIS — M961 Postlaminectomy syndrome, not elsewhere classified: Secondary | ICD-10-CM

## 2019-08-29 DIAGNOSIS — M5416 Radiculopathy, lumbar region: Secondary | ICD-10-CM

## 2019-08-29 DIAGNOSIS — T85192S Other mechanical complication of implanted electronic neurostimulator (electrode) of spinal cord, sequela: Secondary | ICD-10-CM

## 2019-08-29 DIAGNOSIS — M47816 Spondylosis without myelopathy or radiculopathy, lumbar region: Secondary | ICD-10-CM

## 2019-08-29 DIAGNOSIS — Z981 Arthrodesis status: Secondary | ICD-10-CM | POA: Diagnosis not present

## 2019-08-29 DIAGNOSIS — G8929 Other chronic pain: Secondary | ICD-10-CM
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
## 2019-08-30 ENCOUNTER — Ambulatory Visit: Payer: Self-pay

## 2019-09-09 ENCOUNTER — Ambulatory Visit (INDEPENDENT_AMBULATORY_CARE_PROVIDER_SITE_OTHER): Payer: Medicaid Other

## 2019-09-09 ENCOUNTER — Other Ambulatory Visit: Payer: Self-pay

## 2019-09-09 DIAGNOSIS — J4540 Moderate persistent asthma, uncomplicated: Secondary | ICD-10-CM

## 2019-09-09 DIAGNOSIS — J4550 Severe persistent asthma, uncomplicated: Secondary | ICD-10-CM

## 2019-09-12 ENCOUNTER — Other Ambulatory Visit: Payer: Self-pay | Admitting: Allergy

## 2019-09-20 ENCOUNTER — Telehealth: Payer: Self-pay | Admitting: Allergy

## 2019-09-20 MED ORDER — LORATADINE 10 MG PO TABS: 10.0000 mg | ORAL_TABLET | Freq: Two times a day (BID) | ORAL | 5 refills | Status: DC

## 2019-09-20 NOTE — Telephone Encounter (Signed)
Prescription has been sent to pharmacy, patient is aware.

## 2019-09-20 NOTE — Telephone Encounter (Signed)
Unfortunately there is not really anything stronger than what she is already tried.  I think she is just not very responsive to antihistamines.  If she feels that Claritin works the best she can always take Claritin twice a day.  Is she wearing face covering her face mask while at work?  If not I would recommend she do that to help decrease the exposure.  Is she using the nasal Atrovent?  She can use nasal Atrovent 3-4 times a day to help decrease the nasal drainage.

## 2019-09-20 NOTE — Telephone Encounter (Signed)
Called and spoke with patient and she verbalized understanding. She is using the Atrovent but without much benefit. Advised to use 3-4 times daily as needed, patient verbalized understanding. Patient was wondering could we send in a prescription for the Claritin 2 times daily to try and help get coverage with her insurance since she has Medicaid? Please advise.

## 2019-09-20 NOTE — Telephone Encounter (Signed)
Patient called and states that the medication she was prescribed at her last visit is not working Photographer or Ryvent). Patient states her workplace is very dusty making her nose run often. Patient states she is running out of options, but she has found that Claritin works the best but it does not last long. Patient would like to know if there is a stronger Claritin.  Uses [facility] in [place].  Please advise.

## 2019-09-20 NOTE — Telephone Encounter (Signed)
Yes we can definitely send in the Claritin for twice a day use.  Please send.

## 2019-09-20 NOTE — Telephone Encounter (Signed)
Also Per Mel Almond:  Patient was seen by Dr. Nelva Bush on 08/17/19. She said she was given some liquid samples for her allergies. She said nothing is working. She has taken Allegra and Claritin, but nothing is helping very much. She wants to know if there is anything else that can be sent in?

## 2019-09-20 NOTE — Telephone Encounter (Signed)
Patient was seen by Dr. Nelva Bush on 08/17/19. She said she was given some liquid samples for her allergies. She said nothing is working. She has taken Allegra and Claritin, but nothing is helping very much. She wants to know if there is anything else that can be sent in?

## 2019-09-25 ENCOUNTER — Telehealth: Payer: Self-pay

## 2019-09-25 NOTE — Telephone Encounter (Signed)
Pt returning nurse call, has appt mon 26th.

## 2019-09-26 ENCOUNTER — Encounter: Payer: Self-pay | Admitting: Student in an Organized Health Care Education/Training Program

## 2019-09-30 ENCOUNTER — Telehealth: Payer: Medicaid Other | Admitting: Student in an Organized Health Care Education/Training Program

## 2019-10-01 ENCOUNTER — Other Ambulatory Visit: Payer: Self-pay

## 2019-10-01 ENCOUNTER — Ambulatory Visit
Payer: Medicaid Other | Attending: Student in an Organized Health Care Education/Training Program | Admitting: Student in an Organized Health Care Education/Training Program

## 2019-10-01 ENCOUNTER — Encounter: Payer: Self-pay | Admitting: Student in an Organized Health Care Education/Training Program

## 2019-10-01 DIAGNOSIS — M961 Postlaminectomy syndrome, not elsewhere classified: Secondary | ICD-10-CM | POA: Diagnosis not present

## 2019-10-01 DIAGNOSIS — M5416 Radiculopathy, lumbar region: Secondary | ICD-10-CM | POA: Diagnosis not present

## 2019-10-01 DIAGNOSIS — Z981 Arthrodesis status: Secondary | ICD-10-CM | POA: Diagnosis not present

## 2019-10-01 DIAGNOSIS — M47816 Spondylosis without myelopathy or radiculopathy, lumbar region: Secondary | ICD-10-CM

## 2019-10-01 DIAGNOSIS — T85192S Other mechanical complication of implanted electronic neurostimulator (electrode) of spinal cord, sequela: Secondary | ICD-10-CM

## 2019-10-01 DIAGNOSIS — G8929 Other chronic pain: Secondary | ICD-10-CM
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
## 2019-10-07 ENCOUNTER — Other Ambulatory Visit: Payer: Self-pay | Admitting: Orthopedic Surgery

## 2019-10-07 DIAGNOSIS — M545 Low back pain, unspecified: Secondary | ICD-10-CM

## 2019-10-07 DIAGNOSIS — M5416 Radiculopathy, lumbar region: Secondary | ICD-10-CM

## 2019-10-21 ENCOUNTER — Other Ambulatory Visit: Payer: Self-pay

## 2019-10-21 ENCOUNTER — Ambulatory Visit
Admission: RE | Admit: 2019-10-21 | Discharge: 2019-10-21 | Disposition: A | Discharge status: Home / Self Care | Payer: Medicaid Other | Source: Ambulatory Visit | Attending: Orthopedic Surgery | Admitting: Orthopedic Surgery

## 2019-10-21 DIAGNOSIS — M5416 Radiculopathy, lumbar region: Secondary | ICD-10-CM

## 2019-10-21 DIAGNOSIS — M545 Low back pain, unspecified: Secondary | ICD-10-CM

## 2019-11-06 ENCOUNTER — Other Ambulatory Visit: Payer: Self-pay | Admitting: Student in an Organized Health Care Education/Training Program

## 2019-11-06 ENCOUNTER — Ambulatory Visit: Payer: Self-pay

## 2019-11-07 ENCOUNTER — Telehealth: Payer: Self-pay | Admitting: *Deleted

## 2019-11-07 DIAGNOSIS — M5416 Radiculopathy, lumbar region: Secondary | ICD-10-CM

## 2019-11-07 MED ORDER — CYCLOBENZAPRINE HCL 10 MG PO TABS: 10.0000 mg | ORAL_TABLET | Freq: Two times a day (BID) | ORAL | 2 refills | Status: DC | PRN

## 2019-11-07 MED ORDER — DICLOFENAC SODIUM 75 MG PO TBEC: 75.0000 mg | DELAYED_RELEASE_TABLET | Freq: Two times a day (BID) | ORAL | 2 refills | Status: DC | PRN

## 2019-11-07 NOTE — Telephone Encounter (Signed)
Patient called and states she needs refill on her diclofenac and flexeril.  She was last seen on 10/01/19 at which she states Dr Holley Raring ask if she needed refills and she thought she would be okay but now is completely out of diclofenac and getting close on Flexeril.  I told her that I would send a message to see if he would escribe both of these.

## 2019-11-07 NOTE — Telephone Encounter (Signed)
Pharmacy only got the ibuprofen script and not the other two. Can someone call the rest out to them? She called this morning and again this afternoon.

## 2019-11-11 ENCOUNTER — Ambulatory Visit (INDEPENDENT_AMBULATORY_CARE_PROVIDER_SITE_OTHER): Payer: Medicaid Other

## 2019-11-11 ENCOUNTER — Other Ambulatory Visit: Payer: Self-pay

## 2019-11-11 ENCOUNTER — Other Ambulatory Visit: Payer: Self-pay | Admitting: Allergy

## 2019-11-11 ENCOUNTER — Encounter: Payer: Self-pay | Admitting: Diagnostic Neuroimaging

## 2019-11-11 ENCOUNTER — Ambulatory Visit: Payer: Medicaid Other | Admitting: Diagnostic Neuroimaging

## 2019-11-11 VITALS — BP 115/79 | HR 68 | Ht 66.0 in | Wt 170.0 lb

## 2019-11-11 DIAGNOSIS — J4540 Moderate persistent asthma, uncomplicated: Secondary | ICD-10-CM

## 2019-11-11 DIAGNOSIS — R200 Anesthesia of skin: Secondary | ICD-10-CM | POA: Diagnosis not present

## 2019-11-11 DIAGNOSIS — Z981 Arthrodesis status: Secondary | ICD-10-CM

## 2019-11-11 DIAGNOSIS — M5416 Radiculopathy, lumbar region: Secondary | ICD-10-CM | POA: Diagnosis not present

## 2019-11-11 DIAGNOSIS — J4550 Severe persistent asthma, uncomplicated: Secondary | ICD-10-CM | POA: Diagnosis not present
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
## 2019-11-12 ENCOUNTER — Telehealth: Payer: Self-pay

## 2019-11-12 LAB — HEMOGLOBIN A1C
Est. average glucose Bld gHb Est-mCnc: 100 mg/dL
Hgb A1c MFr Bld: 5.1 % (ref 4.8–5.6)

## 2019-11-12 LAB — TSH: TSH: 0.732 u[IU]/mL (ref 0.450–4.500)

## 2019-11-12 LAB — VITAMIN B12: Vitamin B-12: 785 pg/mL (ref 232–1245)

## 2019-11-12 MED ORDER — CLOBETASOL PROPIONATE 0.05 % EX OINT: 1.0000 "application " | TOPICAL_OINTMENT | Freq: Two times a day (BID) | CUTANEOUS | 0 refills | Status: DC

## 2019-11-12 NOTE — Telephone Encounter (Signed)
Patient came in for her Arcadia injection 11/11/2019 and stated her the medication that was given to her for her Eczema isn't working and she wanted to know if something else can be prescribed to her.

## 2019-11-12 NOTE — Telephone Encounter (Signed)
ERROR

## 2019-11-12 NOTE — Telephone Encounter (Signed)
Called patient and she states she's been using the Triamcinolone 1%. She states she was told she could take some prednisone but the pharmacist told her that due to the other medications she's taking it will affect her kidneys. Patient states she was told she could get a prednisone shot.

## 2019-11-12 NOTE — Telephone Encounter (Signed)
What is she using currently?  I couldn't find anything in the medication list.

## 2019-11-12 NOTE — Telephone Encounter (Signed)
If her skin is flare to the point of someone recommending a steroid injection then she needs to be seen.   Otherwise we can increase the strength of her topical steroid to clobetasol ointment 1 application 1-2 times a day as needed for eczema flare (red, inflamed, itchy, dry, flaky/scaly areas) on body (do not use on face, armpits or genitals)  Do not use clobetasol longer than 2 weeks at time

## 2019-11-12 NOTE — Telephone Encounter (Signed)
Per Daisy Lazar, Patient is currently using Triamcinolone. Patient called back requesting a steroid injection. Her pharmacy recommended this for her.    Please Advise Dr Nelva Bush  Thanks

## 2019-11-12 NOTE — Addendum Note (Signed)
Addended by: Herbie Drape on: 11/12/2019 05:13 PM   Modules accepted: Orders

## 2019-11-12 NOTE — Telephone Encounter (Signed)
Called and informed patient of Dr. Jeralyn Ruths recommendation. Patient verbalized understand and will call the office tomorrow and schedule an appointment once she see's what days she is available.

## 2019-12-06 ENCOUNTER — Other Ambulatory Visit: Payer: Self-pay | Admitting: Allergy

## 2019-12-11 ENCOUNTER — Telehealth: Payer: Self-pay

## 2019-12-11 ENCOUNTER — Other Ambulatory Visit: Payer: Self-pay

## 2019-12-11 MED ORDER — OLOPATADINE HCL 0.2 % OP SOLN
OPHTHALMIC | 5 refills | Status: DC
Start: 2019-12-11 — End: 2020-02-20

## 2019-12-11 NOTE — Telephone Encounter (Signed)
Pataday is fine.  1 drop each eye daily as needed for itchy/watery eyes

## 2019-12-11 NOTE — Telephone Encounter (Signed)
Insurance will not cover Optivar. Preferred are Pataday or Cromolyn. Please advise on alternative and thank you.

## 2019-12-11 NOTE — Telephone Encounter (Signed)
Prescription for pataday was sent in to patients pharmacy. Unable to reach patient. Left message on voicemail to inform patient.

## 2019-12-24 ENCOUNTER — Encounter: Payer: Self-pay | Admitting: Student in an Organized Health Care Education/Training Program

## 2019-12-24 ENCOUNTER — Encounter: Payer: Self-pay | Admitting: Emergency Medicine

## 2019-12-24 ENCOUNTER — Ambulatory Visit
Admission: EM | Admit: 2019-12-24 | Discharge: 2019-12-24 | Disposition: A | Discharge status: Home / Self Care | Payer: Medicaid Other | Attending: Emergency Medicine | Admitting: Emergency Medicine

## 2019-12-24 DIAGNOSIS — G8929 Other chronic pain: Secondary | ICD-10-CM | POA: Diagnosis not present

## 2019-12-24 DIAGNOSIS — M25511 Pain in right shoulder: Secondary | ICD-10-CM

## 2019-12-24 MED ORDER — TIZANIDINE HCL 2 MG PO CAPS
2.0000 mg | ORAL_CAPSULE | Freq: Three times a day (TID) | ORAL | 0 refills | Status: DC
Start: 2019-12-24 — End: 2020-07-21

## 2019-12-24 MED ORDER — DEXAMETHASONE SODIUM PHOSPHATE 10 MG/ML IJ SOLN
10.0000 mg | Freq: Once | INTRAMUSCULAR | Status: AC
  Administered 2019-12-24: 10 mg via INTRAMUSCULAR

## 2019-12-24 MED ORDER — PREDNISONE 10 MG (21) PO TBPK
ORAL_TABLET | Freq: Every day | ORAL | 0 refills | Status: DC
Start: 2019-12-24 — End: 2019-12-26

## 2019-12-24 NOTE — Discharge Instructions (Signed)
Steroid shot given in office Continue conservative management of rest, ice, and gentle stretches Prednisone prescribed.  Take as directed and to completion Take zanaflex at nighttime for symptomatic relief. Avoid driving or operating heavy machinery while using medication. Follow up with PCP or orthopedist if symptoms persist Return or go to the ER if you have any new or worsening symptoms (fever, chills, chest pain, redness, swelling, deformity, etc...)

## 2019-12-24 NOTE — ED Triage Notes (Signed)
Last month right shoulder pain, worse last 3 days, no injury to note.
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
## 2019-12-25 ENCOUNTER — Encounter: Payer: Self-pay | Admitting: Student in an Organized Health Care Education/Training Program

## 2019-12-25 ENCOUNTER — Other Ambulatory Visit: Payer: Self-pay

## 2019-12-25 ENCOUNTER — Ambulatory Visit
Payer: Medicaid Other | Attending: Student in an Organized Health Care Education/Training Program | Admitting: Student in an Organized Health Care Education/Training Program

## 2019-12-25 DIAGNOSIS — G894 Chronic pain syndrome: Secondary | ICD-10-CM

## 2019-12-25 DIAGNOSIS — M47816 Spondylosis without myelopathy or radiculopathy, lumbar region: Secondary | ICD-10-CM

## 2019-12-25 DIAGNOSIS — Z981 Arthrodesis status: Secondary | ICD-10-CM

## 2019-12-25 DIAGNOSIS — M5416 Radiculopathy, lumbar region: Secondary | ICD-10-CM | POA: Diagnosis not present

## 2019-12-25 DIAGNOSIS — M961 Postlaminectomy syndrome, not elsewhere classified: Secondary | ICD-10-CM | POA: Diagnosis not present

## 2019-12-25 DIAGNOSIS — G8929 Other chronic pain: Secondary | ICD-10-CM

## 2019-12-25 DIAGNOSIS — T85192S Other mechanical complication of implanted electronic neurostimulator (electrode) of spinal cord, sequela: Secondary | ICD-10-CM
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
## 2019-12-26 ENCOUNTER — Emergency Department (HOSPITAL_COMMUNITY)
Admission: EM | Admit: 2019-12-26 | Discharge: 2019-12-26 | Disposition: A | Discharge status: Home / Self Care | Payer: Medicaid Other | Attending: Emergency Medicine | Admitting: Emergency Medicine

## 2019-12-26 ENCOUNTER — Other Ambulatory Visit: Payer: Self-pay

## 2019-12-26 ENCOUNTER — Encounter (HOSPITAL_COMMUNITY): Payer: Self-pay | Admitting: Emergency Medicine

## 2019-12-26 DIAGNOSIS — T63441A Toxic effect of venom of bees, accidental (unintentional), initial encounter: Secondary | ICD-10-CM | POA: Insufficient documentation

## 2019-12-26 DIAGNOSIS — Z86010 Personal history of colonic polyps: Secondary | ICD-10-CM | POA: Diagnosis not present

## 2019-12-26 DIAGNOSIS — Z79899 Other long term (current) drug therapy: Secondary | ICD-10-CM | POA: Diagnosis not present

## 2019-12-26 DIAGNOSIS — J4540 Moderate persistent asthma, uncomplicated: Secondary | ICD-10-CM | POA: Insufficient documentation

## 2019-12-26 DIAGNOSIS — Z87891 Personal history of nicotine dependence: Secondary | ICD-10-CM | POA: Diagnosis not present

## 2019-12-26 DIAGNOSIS — Z91040 Latex allergy status: Secondary | ICD-10-CM | POA: Diagnosis not present

## 2019-12-26 DIAGNOSIS — Z7951 Long term (current) use of inhaled steroids: Secondary | ICD-10-CM | POA: Diagnosis not present

## 2019-12-26 DIAGNOSIS — R0600 Dyspnea, unspecified: Secondary | ICD-10-CM | POA: Insufficient documentation

## 2019-12-26 DIAGNOSIS — T7840XA Allergy, unspecified, initial encounter: Secondary | ICD-10-CM

## 2019-12-26 MED ORDER — PREDNISONE 10 MG PO TABS
20.0000 mg | ORAL_TABLET | Freq: Two times a day (BID) | ORAL | 0 refills | Status: DC
Start: 2019-12-26 — End: 2020-02-11

## 2019-12-26 MED ORDER — EPINEPHRINE 0.3 MG/0.3ML IJ SOAJ
0.3000 mg | INTRAMUSCULAR | 0 refills | Status: DC | PRN
Start: 2019-12-26 — End: 2020-01-07

## 2019-12-26 MED ORDER — EPINEPHRINE 0.3 MG/0.3ML IJ SOAJ
0.3000 mg | INTRAMUSCULAR | 0 refills | Status: DC | PRN
Start: 2019-12-26 — End: 2019-12-26

## 2019-12-26 MED ORDER — DIPHENHYDRAMINE HCL 50 MG/ML IJ SOLN
25.0000 mg | Freq: Once | INTRAMUSCULAR | Status: AC
  Administered 2019-12-26: 25 mg via INTRAVENOUS
  Filled 2019-12-26: qty 1

## 2019-12-26 MED ORDER — LORAZEPAM 2 MG/ML IJ SOLN
1.0000 mg | Freq: Once | INTRAMUSCULAR | Status: AC
  Administered 2019-12-26: 1 mg via INTRAVENOUS
  Filled 2019-12-26: qty 1

## 2019-12-26 MED ORDER — METHYLPREDNISOLONE SODIUM SUCC 125 MG IJ SOLR
125.0000 mg | Freq: Once | INTRAMUSCULAR | Status: AC
  Administered 2019-12-26: 125 mg via INTRAVENOUS
  Filled 2019-12-26: qty 2
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
## 2019-12-26 NOTE — Discharge Instructions (Addendum)
Begin taking prednisone 20 mg twice daily for the next 3 days.  Take Benadryl 25 mg every 6 hours for the next 3 days.  Return to the emergency department if you develop difficulty breathing, throat swelling, or other concerning symptoms.

## 2019-12-26 NOTE — ED Triage Notes (Signed)
Pt reports an allergy to bee venom, was stung on her right hand x 15 mins ago, pt reports she used her epi-pen x 42mins ago
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
## 2019-12-26 NOTE — ED Notes (Signed)
Pt resting with equal rise and chest fall  

## 2020-01-01 ENCOUNTER — Telehealth: Payer: Self-pay | Admitting: *Deleted

## 2020-01-01 NOTE — Telephone Encounter (Signed)
Dr Holley Raring had left for the day when I went to ask.

## 2020-01-02 NOTE — Telephone Encounter (Signed)
Called patient to notify paper was ready. Patient will get it when she comes in for appt. Placed in purple box in cabinet.

## 2020-01-02 NOTE — Telephone Encounter (Signed)
Please advise about this. Thank you.

## 2020-01-07 ENCOUNTER — Telehealth: Payer: Self-pay | Admitting: Allergy

## 2020-01-07 MED ORDER — EPINEPHRINE 0.3 MG/0.3ML IJ SOAJ: 0.3000 mg | INTRAMUSCULAR | 0 refills | Status: DC | PRN

## 2020-01-07 NOTE — Telephone Encounter (Signed)
Patient called and made appointment for 02/20/2020 with dr Nelva Bush, and needs to have epi-pens called into [facility]. [phone].

## 2020-01-07 NOTE — Telephone Encounter (Signed)
Epi was already called into pharmacy. Patient stated she had been stung by multiple wasp and went to the ER on 12/26/2019. Will send in a new script.

## 2020-01-08 ENCOUNTER — Ambulatory Visit (INDEPENDENT_AMBULATORY_CARE_PROVIDER_SITE_OTHER): Payer: Medicaid Other

## 2020-01-08 DIAGNOSIS — J4550 Severe persistent asthma, uncomplicated: Secondary | ICD-10-CM | POA: Diagnosis not present

## 2020-01-15 ENCOUNTER — Ambulatory Visit (HOSPITAL_BASED_OUTPATIENT_CLINIC_OR_DEPARTMENT_OTHER): Payer: Medicaid Other | Admitting: Student in an Organized Health Care Education/Training Program

## 2020-01-15 ENCOUNTER — Other Ambulatory Visit: Payer: Self-pay

## 2020-01-15 ENCOUNTER — Ambulatory Visit
Admission: RE | Admit: 2020-01-15 | Discharge: 2020-01-15 | Disposition: A | Discharge status: Home / Self Care | Payer: Medicaid Other | Source: Ambulatory Visit | Attending: Student in an Organized Health Care Education/Training Program | Admitting: Student in an Organized Health Care Education/Training Program

## 2020-01-15 ENCOUNTER — Ambulatory Visit
Admission: RE | Admit: 2020-01-15 | Discharge: 2020-01-15 | Disposition: A | Discharge status: Home / Self Care | Payer: Medicaid Other | Attending: Student in an Organized Health Care Education/Training Program | Admitting: Student in an Organized Health Care Education/Training Program

## 2020-01-15 ENCOUNTER — Encounter: Payer: Self-pay | Admitting: Student in an Organized Health Care Education/Training Program

## 2020-01-15 VITALS — BP 146/103 | HR 82 | Temp 97.4°F | Ht 66.0 in | Wt 160.0 lb

## 2020-01-15 DIAGNOSIS — G894 Chronic pain syndrome: Secondary | ICD-10-CM

## 2020-01-15 DIAGNOSIS — M19011 Primary osteoarthritis, right shoulder: Secondary | ICD-10-CM

## 2020-01-15 DIAGNOSIS — M25511 Pain in right shoulder: Secondary | ICD-10-CM

## 2020-01-15 DIAGNOSIS — M67911 Unspecified disorder of synovium and tendon, right shoulder: Secondary | ICD-10-CM

## 2020-01-15 DIAGNOSIS — G8929 Other chronic pain: Secondary | ICD-10-CM | POA: Insufficient documentation

## 2020-01-15 NOTE — Patient Instructions (Signed)
____________________________________________________________________________________________  Preparing for Procedure with Sedation  Procedure appointments are limited to planned procedures: . No Prescription Refills. . No disability issues will be discussed. . No medication changes will be discussed.  Instructions: . Oral Intake: Do not eat or drink anything for at least 8 hours prior to your procedure. (Exception: Blood Pressure Medication. See below.) . Transportation: Unless otherwise stated by your physician, you may drive yourself after the procedure. . Blood Pressure Medicine: Do not forget to take your blood pressure medicine with a sip of water the morning of the procedure. If your Diastolic (lower reading)is above 100 mmHg, elective cases will be cancelled/rescheduled. . Blood thinners: These will need to be stopped for procedures. Notify our staff if you are taking any blood thinners. Depending on which one you take, there will be specific instructions on how and when to stop it. . Diabetics on insulin: Notify the staff so that you can be scheduled 1st case in the morning. If your diabetes requires high dose insulin, take only  of your normal insulin dose the morning of the procedure and notify the staff that you have done so. . Preventing infections: Shower with an antibacterial soap the morning of your procedure. . Build-up your immune system: Take 1000 mg of Vitamin C with every meal (3 times a day) the day prior to your procedure. . Antibiotics: Inform the staff if you have a condition or reason that requires you to take antibiotics before dental procedures. . Pregnancy: If you are pregnant, call and cancel the procedure. . Sickness: If you have a cold, fever, or any active infections, call and cancel the procedure. . Arrival: You must be in the facility at least 30 minutes prior to your scheduled procedure. . Children: Do not bring children with you. . Dress appropriately:  Bring dark clothing that you would not mind if they get stained. . Valuables: Do not bring any jewelry or valuables.  Reasons to call and reschedule or cancel your procedure: (Following these recommendations will minimize the risk of a serious complication.) . Surgeries: Avoid having procedures within 2 weeks of any surgery. (Avoid for 2 weeks before or after any surgery). . Flu Shots: Avoid having procedures within 2 weeks of a flu shots or . (Avoid for 2 weeks before or after immunizations). . Barium: Avoid having a procedure within 7-10 days after having had a radiological study involving the use of radiological contrast. (Myelograms, Barium swallow or enema study). . Heart attacks: Avoid any elective procedures or surgeries for the initial 6 months after a "Myocardial Infarction" (Heart Attack). . Blood thinners: It is imperative that you stop these medications before procedures. Let us know if you if you take any blood thinner.  . Infection: Avoid procedures during or within two weeks of an infection (including chest colds or gastrointestinal problems). Symptoms associated with infections include: Localized redness, fever, chills, night sweats or profuse sweating, burning sensation when voiding, cough, congestion, stuffiness, runny nose, sore throat, diarrhea, nausea, vomiting, cold or Flu symptoms, recent or current infections. It is specially important if the infection is over the area that we intend to treat. . Heart and lung problems: Symptoms that may suggest an active cardiopulmonary problem include: cough, chest pain, breathing difficulties or shortness of breath, dizziness, ankle swelling, uncontrolled high or unusually low blood pressure, and/or palpitations. If you are experiencing any of these symptoms, cancel your procedure and contact your primary care physician for an evaluation.  Remember:  Regular Business hours are:    Monday to Thursday 8:00 AM to 4:00 PM  Provider's  Schedule: Francisco Naveira, MD:  Procedure days: Tuesday and Thursday 7:30 AM to 4:00 PM  Bilal Lateef, MD:  Procedure days: Monday and Wednesday 7:30 AM to 4:00 PM ____________________________________________________________________________________________    

## 2020-01-15 NOTE — Progress Notes (Signed)
Safety precautions to be maintained throughout the outpatient stay will include: orient to surroundings, keep bed in low position, maintain call bell within reach at all times, provide assistance with transfer out of bed and ambulation.  
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
## 2020-01-16 ENCOUNTER — Telehealth: Payer: Self-pay | Admitting: *Deleted

## 2020-01-16 ENCOUNTER — Telehealth: Payer: Self-pay | Admitting: Student in an Organized Health Care Education/Training Program

## 2020-01-16 ENCOUNTER — Ambulatory Visit: Payer: Medicaid Other | Admitting: Student in an Organized Health Care Education/Training Program

## 2020-01-16 ENCOUNTER — Encounter: Payer: Self-pay | Admitting: Student in an Organized Health Care Education/Training Program

## 2020-01-16 NOTE — Telephone Encounter (Signed)
I called the patient to let her know her medicaid is out of network with [place]. She cant get the injection because she cant afford to pay for it. She wants to know when the UDS comes back can she get medicines since its her only option right now.

## 2020-01-16 NOTE — Telephone Encounter (Signed)
Patient has called to discuss imaging from this week.  I did read impression to patient and basically told her it was negative.  She states she still can't raise her arm up.  I told her that I would ask Dr Holley Raring to review imaging to make sure there is nothing he would do differently.  Otherwise I told patient that having the injection was in the plan.  I will check to see if the PA has come through for her.

## 2020-01-16 NOTE — Telephone Encounter (Signed)
Please advise on this.  

## 2020-01-16 NOTE — Telephone Encounter (Signed)
Patient notified

## 2020-01-16 NOTE — Telephone Encounter (Signed)
This patient had xrays on Wed. She would like to know results please

## 2020-01-16 NOTE — Telephone Encounter (Signed)
Called patient to convey information that Dr Holley Raring sent back.  She is working with insurance and is scheduled for September 9th.

## 2020-01-17 ENCOUNTER — Telehealth: Payer: Self-pay | Admitting: Student in an Organized Health Care Education/Training Program

## 2020-01-17 LAB — COMPLIANCE DRUG ANALYSIS, UR

## 2020-01-17 NOTE — Telephone Encounter (Signed)
Patient called asking if there was anything in her UDS. She can see results on MyChart but does not know how to read them

## 2020-01-20 ENCOUNTER — Telehealth: Payer: Self-pay | Admitting: *Deleted

## 2020-01-20 NOTE — Telephone Encounter (Signed)
Attempted to call patient in response to her MyChart message to discuss x-ray results.

## 2020-01-23 ENCOUNTER — Telehealth: Payer: Self-pay | Admitting: Student in an Organized Health Care Education/Training Program

## 2020-01-23 NOTE — Telephone Encounter (Signed)
Patient notified

## 2020-01-23 NOTE — Telephone Encounter (Signed)
Is there anything you can suggest? Need appt?

## 2020-01-23 NOTE — Telephone Encounter (Signed)
Pt called stating that she is having a lot of pain in her shoulder and is waiting for September 1st for her insurance to switch over so she can have a procedure done. She wants to know if there is anything that Dr Holley Raring can give her or recommend for her to do in the mean time for pain. She states that she has had to call out of work due to pain.

## 2020-01-31 ENCOUNTER — Other Ambulatory Visit: Payer: Self-pay | Admitting: Student in an Organized Health Care Education/Training Program

## 2020-01-31 DIAGNOSIS — M5416 Radiculopathy, lumbar region: Secondary | ICD-10-CM

## 2020-02-05 DIAGNOSIS — G43909 Migraine, unspecified, not intractable, without status migrainosus: Secondary | ICD-10-CM | POA: Insufficient documentation

## 2020-02-07 ENCOUNTER — Other Ambulatory Visit: Payer: Self-pay | Admitting: Allergy & Immunology

## 2020-02-11 ENCOUNTER — Ambulatory Visit
Admission: EM | Admit: 2020-02-11 | Discharge: 2020-02-11 | Disposition: A | Discharge status: Home / Self Care | Payer: Medicaid Other | Attending: Emergency Medicine | Admitting: Emergency Medicine

## 2020-02-11 ENCOUNTER — Other Ambulatory Visit: Payer: Self-pay

## 2020-02-11 ENCOUNTER — Encounter: Payer: Self-pay | Admitting: Emergency Medicine

## 2020-02-11 DIAGNOSIS — J069 Acute upper respiratory infection, unspecified: Secondary | ICD-10-CM | POA: Diagnosis not present

## 2020-02-11 DIAGNOSIS — Z1152 Encounter for screening for COVID-19: Secondary | ICD-10-CM | POA: Diagnosis not present

## 2020-02-11 MED ORDER — FLUTICASONE PROPIONATE 50 MCG/ACT NA SUSP: 1.0000 | Freq: Every day | NASAL | 0 refills | Status: DC

## 2020-02-11 MED ORDER — CETIRIZINE HCL 10 MG PO TABS: 10.0000 mg | ORAL_TABLET | Freq: Every day | ORAL | 0 refills | Status: DC

## 2020-02-11 MED ORDER — BENZONATATE 100 MG PO CAPS
100.0000 mg | ORAL_CAPSULE | Freq: Three times a day (TID) | ORAL | 0 refills | Status: DC
Start: 2020-02-11 — End: 2020-07-21

## 2020-02-11 MED ORDER — PREDNISONE 10 MG PO TABS: 20.0000 mg | ORAL_TABLET | Freq: Every day | ORAL | 0 refills | Status: DC

## 2020-02-11 NOTE — Discharge Instructions (Signed)
COVID testing ordered.  It will take between 2-7 days for test results.  Someone will contact you regarding abnormal results.    In the meantime: You should remain isolated in your home for 10 days from symptom onset AND greater than 24 hours after symptoms resolution (absence of fever without the use of fever-reducing medication and improvement in respiratory symptoms), whichever is longer Get plenty of rest and push fluids Tessalon Perles prescribed for cough Zyrtec for nasal congestion, runny nose, and/or sore throat Flonase for nasal congestion and runny nose Low-dose prednisone was prescribed Use medications daily for symptom relief Use OTC medications like ibuprofen or tylenol as needed fever or pain Call or go to the ED if you have any new or worsening symptoms such as fever, worsening cough, shortness of breath, chest tightness, chest pain, turning blue, changes in mental status, etc...  
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
## 2020-02-11 NOTE — ED Triage Notes (Signed)
Cough, body aches and sinus drainage since Sunday

## 2020-02-13 ENCOUNTER — Ambulatory Visit: Payer: Medicaid Other | Admitting: Student in an Organized Health Care Education/Training Program

## 2020-02-14 ENCOUNTER — Telehealth (HOSPITAL_COMMUNITY): Payer: Self-pay | Admitting: Nurse Practitioner

## 2020-02-14 ENCOUNTER — Encounter: Payer: Self-pay | Admitting: Nurse Practitioner

## 2020-02-14 DIAGNOSIS — U071 COVID-19: Secondary | ICD-10-CM

## 2020-02-14 LAB — NOVEL CORONAVIRUS, NAA

## 2020-02-14 NOTE — Telephone Encounter (Signed)
Called to Discuss with patient about Covid symptoms and the use of regeneron, a monoclonal antibody infusion for those with mild to moderate Covid symptoms and at a high risk of hospitalization.     Pt is qualified for this infusion at the  infusion center due to co-morbid conditions and/or a member of an at-risk group.     Unable to reach pt. Left message to return call. Sent mychart message.   Shevy Yaney, DNP, AGNP-C [phone] (Infusion Center Hotline)  

## 2020-02-15 ENCOUNTER — Telehealth: Payer: Self-pay | Admitting: *Deleted

## 2020-02-15 ENCOUNTER — Ambulatory Visit: Payer: Self-pay

## 2020-02-15 NOTE — Telephone Encounter (Addendum)
Patient was seen on 02/11/20 for COVID symptoms. Result received from [facility] that we are unable to determine result.   Patient is here today to be retested.   COVID swab recollected under telephone encounter. Patient updated on turn around time for result.

## 2020-02-17 LAB — NOVEL CORONAVIRUS, NAA: SARS-CoV-2, NAA: NOT DETECTED

## 2020-02-17 LAB — SARS-COV-2, NAA 2 DAY TAT

## 2020-02-19 DIAGNOSIS — K644 Residual hemorrhoidal skin tags: Secondary | ICD-10-CM

## 2020-02-20 ENCOUNTER — Other Ambulatory Visit: Payer: Self-pay

## 2020-02-20 ENCOUNTER — Encounter: Payer: Self-pay | Admitting: Allergy

## 2020-02-20 ENCOUNTER — Ambulatory Visit (INDEPENDENT_AMBULATORY_CARE_PROVIDER_SITE_OTHER): Payer: Medicaid Other | Admitting: Allergy

## 2020-02-20 VITALS — BP 110/70 | HR 89 | Temp 98.0°F | Resp 16 | Wt 169.4 lb

## 2020-02-20 DIAGNOSIS — T6391XD Toxic effect of contact with unspecified venomous animal, accidental (unintentional), subsequent encounter: Secondary | ICD-10-CM

## 2020-02-20 DIAGNOSIS — J4540 Moderate persistent asthma, uncomplicated: Secondary | ICD-10-CM | POA: Diagnosis not present

## 2020-02-20 DIAGNOSIS — J3089 Other allergic rhinitis: Secondary | ICD-10-CM | POA: Diagnosis not present

## 2020-02-20 DIAGNOSIS — H1013 Acute atopic conjunctivitis, bilateral: Secondary | ICD-10-CM

## 2020-02-20 MED ORDER — MONTELUKAST SODIUM 10 MG PO TABS
10.0000 mg | ORAL_TABLET | Freq: Every day | ORAL | 5 refills | Status: DC
Start: 2020-02-20 — End: 2020-09-29

## 2020-02-20 MED ORDER — LORATADINE 10 MG PO TABS
10.0000 mg | ORAL_TABLET | Freq: Two times a day (BID) | ORAL | 5 refills | Status: DC
Start: 2020-02-20 — End: 2020-05-06

## 2020-02-20 MED ORDER — ALBUTEROL SULFATE HFA 108 (90 BASE) MCG/ACT IN AERS: INHALATION_SPRAY | RESPIRATORY_TRACT | 1 refills | Status: DC

## 2020-02-20 MED ORDER — AZELASTINE HCL 0.05 % OP SOLN
1.0000 [drp] | Freq: Two times a day (BID) | OPHTHALMIC | 5 refills | Status: DC
Start: 2020-02-20 — End: 2021-01-29

## 2020-02-20 MED ORDER — BUDESONIDE-FORMOTEROL FUMARATE 160-4.5 MCG/ACT IN AERO: 2.0000 | INHALATION_SPRAY | Freq: Two times a day (BID) | RESPIRATORY_TRACT | 5 refills | Status: DC

## 2020-02-20 MED ORDER — IPRATROPIUM BROMIDE 0.06 % NA SOLN
2.0000 | Freq: Three times a day (TID) | NASAL | 5 refills | Status: DC
Start: 2020-02-20 — End: 2020-07-21

## 2020-02-20 MED ORDER — EPINEPHRINE 0.3 MG/0.3ML IJ SOAJ: 0.3000 mg | INTRAMUSCULAR | 0 refills | Status: DC | PRN

## 2020-02-20 MED ORDER — SPIRIVA RESPIMAT 1.25 MCG/ACT IN AERS
2.0000 | INHALATION_SPRAY | Freq: Every day | RESPIRATORY_TRACT | 5 refills | Status: DC
Start: 2020-02-20 — End: 2021-03-26

## 2020-02-20 NOTE — Patient Instructions (Addendum)
Allergic rhinitis  Continue appropriate allergen avoidance measures.  Claritin 10mg  1 tab twice a day  Continue montelukast 10mg  daily  Use nasal Atrovent 2 sprays each nostril daily for control of nasal drainage and congestion.  Can use nasal Atrovent 3-4 times a day if needed   Nasal saline spray (i.e., Simply Saline) or nasal saline lavage (i.e., NeilMed) is recommended as needed and prior to medicated nasal sprays.  Allergic conjunctivitis  Treatment plan as outlined above for allergic rhinitis.  Use Azelastine 1 drop each eye once a twice a day for itchy/watery/red eyes  Can also use eye lubricant drops (i.e., Natural Tears) as needed.  Moderate persistent asthma  Continue Fasenra injections every 8 weeks  Continue Symbicort 160-4.5 g, 2 inhalations twice daily   Use Spiriva  2 inhalations once a day (take midday)  have access to albuterol inhaler 2 puffs every 4-6 hours as needed for cough/wheeze/shortness of breath/chest tightness.  May use 15-20 minutes prior to activity.   Monitor frequency of use.    To maximize pulmonary deposition, use Symbicort and your albuterol inhaler with spacer that has been provided previously  Asthma control goals:   Full participation in all desired activities (may need albuterol before activity)  Albuterol use two time or less a week on average (not counting use with activity)  Cough interfering with sleep two time or less a month  Oral steroids no more than once a year  No hospitalizations  Hymenoptera allergy  Continue avoidance measures to prevent getting stung and have access to epinephrine device (Epipen).  Recommend restarting venom shots to help reduce your risk of severe reaction if stung again   Return in about 4-6 months or sooner if needed

## 2020-02-20 NOTE — Progress Notes (Signed)
Follow-up Note  RE: Sharon Vazquez MRN: [id number] DOB: [birth year] Date of Office Visit: 02/20/2020   History of present illness: Sharon Vazquez is a 39 y.o. female presenting today for follow-up of asthma, allergic conjunctivitis, nocturnal allergy.  She was last seen in the office on 08/23/2019 by myself.  She was stung twice on her hand by wasp and developed difficulty breathing about 10-15 minutes after sting.  She also reports itching.  She used her epipen and went to the ED. this occurred within the last month.  She was sick several weeks ago with a "bad cough" and trouble breathing. She also reports having muscle aches too.  She states she had been smoking black and milds.  She has stopped smoking these.  She states she was using both symbicort and spiriva twice a day with increase symptoms.  She was tested for covid and was negative.  She saw her PCP and did get prednisone, tessalon perls in addition to her usual asthma medication.  She is feeling better at this time.  She states she has been having more nasal congestion and drainage since being sick with URI.  She states she has been using flonase provided by her PCP.    She states the azelastine eye drop was helping more so than olopatadine based eyedrops were.  However with the recent insurance change she states it has not been covered.  She is currently taking claritin and does feel like it is helping and taking twice a day when she remembers.      She states she needs to have another back surgery either in October or November.    Review of systems: Review of Systems  Constitutional: Negative.   HENT:       See HPI  Eyes: Negative.   Respiratory:       See HPI  Cardiovascular: Negative.   Gastrointestinal: Negative.   Musculoskeletal: Positive for back pain.  Skin: Negative.   Neurological: Negative.     All other systems negative unless noted above in HPI  Past medical/social/surgical/family history have  been reviewed and are unchanged unless specifically indicated below.  No changes  Medication List: Current Outpatient Medications  Medication Sig Dispense Refill  . albuterol (PROVENTIL) (2.5 MG/3ML) 0.083% nebulizer solution Take 3 mLs (2.5 mg total) by nebulization every 4 (four) hours as needed for wheezing or shortness of breath. 75 mL 1  . albuterol (VENTOLIN HFA) 108 (90 Base) MCG/ACT inhaler INHALE 1-2 PUFFS EVERY 6 HOURS AS NEEDED FOR WHEEZING. 8.5 g 1  . benzonatate (TESSALON) 100 MG capsule Take 1 capsule (100 mg total) by mouth every 8 (eight) hours. 30 capsule 0  . budesonide-formoterol (SYMBICORT) 160-4.5 MCG/ACT inhaler Inhale 2 puffs into the lungs 2 (two) times daily. 1 each 5  . EPINEPHrine 0.3 mg/0.3 mL IJ SOAJ injection Inject 0.3 mg into the muscle as needed for anaphylaxis. 2 each 0  . FASENRA 30 MG/ML SOSY INJECT THE CONTENTS OF 1 SYRINGE (30 MG) UNDER THE SKIN EVERY 8 WEEKS 1 mL 6  . fluticasone (FLONASE) 50 MCG/ACT nasal spray Place 1 spray into both nostrils daily for 14 days. 16 g 0  . loratadine (CLARITIN) 10 MG tablet Take 1 tablet (10 mg total) by mouth in the morning and at bedtime. 60 tablet 5  . montelukast (SINGULAIR) 10 MG tablet Take 1 tablet (10 mg total) by mouth at bedtime. 30 tablet 5  . Tiotropium Bromide Monohydrate (SPIRIVA RESPIMAT) 1.25 MCG/ACT AERS Inhale 2  puffs into the lungs daily. 4 g 5  . ALPRAZolam (XANAX XR) 2 MG 24 hr tablet Take 2 mg by mouth 2 (two) times daily.     Marland Kitchen alprazolam (XANAX) 2 MG tablet Take 2 mg by mouth 2 (two) times daily. (Patient not taking: Reported on 12/24/2019)    . azelastine (OPTIVAR) 0.05 % ophthalmic solution Place 1 drop into both eyes 2 (two) times daily. 6 mL 5  . clobetasol ointment (TEMOVATE) 8.50 % Apply 1 application topically 2 (two) times daily. 60 g 0  . Cobalamin Combinations (B12 FOLATE) 800-800 MCG CAPS Take 1 capsule by mouth daily.    . diclofenac (VOLTAREN) 75 MG EC tablet Take 1 tablet (75 mg total)  by mouth 2 (two) times daily as needed for moderate pain. Take 1 tablet by mouth two times daily after meals for inflammation, pain, and swelling. 60 tablet 2  . esomeprazole (NEXIUM) 40 MG capsule Take 1 capsule (40 mg total) by mouth daily before breakfast. 90 capsule 3  . gabapentin (NEURONTIN) 300 MG capsule Take 2 capsules (600 mg total) by mouth 3 (three) times daily. 180 capsule 2  . IBU 800 MG tablet Take 800 mg by mouth every 6 (six) hours as needed.    Marland Kitchen ipratropium (ATROVENT) 0.06 % nasal spray Place 2 sprays into both nostrils 3 (three) times daily. 15 mL 5  . lamoTRIgine (LAMICTAL) 150 MG tablet Take 150 mg by mouth daily.     . metoprolol succinate (TOPROL-XL) 100 MG 24 hr tablet Take 100 mg by mouth daily.     Marland Kitchen Plecanatide (TRULANCE) 3 MG TABS Take 3 mg by mouth daily as needed. 30 tablet 5  . polycarbophil (FIBERCON) 625 MG tablet Take 2 tablets (1,250 mg total) by mouth daily. (Patient not taking: Reported on 01/15/2020) 60 tablet 5  . predniSONE (DELTASONE) 10 MG tablet Take 2 tablets (20 mg total) by mouth daily. 15 tablet 0  . Prenatal Vit-Fe Fumarate-FA (M-NATAL PLUS) 27-1 MG TABS TAKE 1 TABLET ONCE DAILY AT 12 NOON 30 tablet 12  . tizanidine (ZANAFLEX) 2 MG capsule Take 1 capsule (2 mg total) by mouth 3 (three) times daily. (Patient not taking: Reported on 01/15/2020) 20 capsule 0  . traMADol (ULTRAM) 50 MG tablet Take 50-100 mg by mouth every 6 (six) hours as needed.    . triamcinolone ointment (KENALOG) 0.1 % Apply 1 application topically 2 (two) times daily as needed. (Patient not taking: Reported on 01/15/2020) 80 g 5   Current Facility-Administered Medications  Medication Dose Route Frequency Provider Last Rate Last Admin  . Benralizumab SOSY 30 mg  30 mg Subcutaneous Q8 Weeks Kennith Gain, MD   30 mg at 01/08/20 2774     Known medication allergies: Allergies  Allergen Reactions  . Bee Venom Anaphylaxis  . Dilaudid [Hydromorphone Hcl] Anaphylaxis and  Hives    Has tolerated morphine since this reaction with Benadryl  . Peanut Oil Anaphylaxis  . Senna Anaphylaxis and Hives  . Adhesive [Tape] Hives and Other (See Comments)    Pulls skin off (use paper tape)  . Latex Hives     Physical examination: Blood pressure 110/70, pulse 89, temperature 98 F (36.7 C), temperature source Temporal, resp. rate 16, weight 169 lb 6.4 oz (76.8 kg), last menstrual period 01/24/2020, SpO2 99 %.  General: Alert, interactive, in no acute distress. HEENT: PERRLA, TMs pearly gray, turbinates minimally edematous without discharge, post-pharynx non erythematous. Neck: Supple without lymphadenopathy. Lungs: Clear to  auscultation without wheezing, rhonchi or rales. {no increased work of breathing. CV: Normal S1, S2 without murmurs. Abdomen: Nondistended, nontender. Skin: Warm and dry, without lesions or rashes. Extremities:  No clubbing, cyanosis or edema. Neuro:   Grossly intact.  Diagnositics/Labs: None today   Assessment and plan:   Allergic rhinitis  Continue appropriate allergen avoidance measures.  Claritin 10mg  1 tab twice a day  Continue montelukast 10mg  daily  Use nasal Atrovent 2 sprays each nostril daily for control of nasal drainage and congestion.  Can use nasal Atrovent 3-4 times a day if needed   Nasal saline spray (i.e., Simply Saline) or nasal saline lavage (i.e., NeilMed) is recommended as needed and prior to medicated nasal sprays.  Allergic conjunctivitis  Treatment plan as outlined above for allergic rhinitis.  Use Azelastine 1 drop each eye once a twice a day for itchy/watery/red eyes  Can also use eye lubricant drops (i.e., Natural Tears) as needed.  Moderate persistent asthma  Continue Fasenra injections every 8 weeks  Continue Symbicort 160-4.5 g, 2 inhalations twice daily   Use Spiriva  2 inhalations once a day (take midday)  have access to albuterol inhaler 2 puffs every 4-6 hours as needed for  cough/wheeze/shortness of breath/chest tightness.  May use 15-20 minutes prior to activity.   Monitor frequency of use.    To maximize pulmonary deposition, use Symbicort and your albuterol inhaler with spacer that has been provided previously  Asthma control goals:   Full participation in all desired activities (may need albuterol before activity)  Albuterol use two time or less a week on average (not counting use with activity)  Cough interfering with sleep two time or less a month  Oral steroids no more than once a year  No hospitalizations  Hymenoptera allergy  Continue avoidance measures to prevent getting stung and have access to epinephrine device (Epipen).  Recommend restarting venom shots to help reduce your risk of severe reaction if stung again.  Advised this can be done at our [place] office which is more convenient for her   Return in about 4-6 months or sooner if needed  I appreciate the opportunity to take part in Sharon Vazquez's care. Please do not hesitate to contact me with questions.  Sincerely,   Prudy Feeler, MD Allergy/Immunology [facility]

## 2020-02-21 ENCOUNTER — Telehealth: Payer: Self-pay

## 2020-02-21 NOTE — Telephone Encounter (Signed)
Prior auth for azelastine eye drops approved.  PA Case: [id number], Status: Approved, Coverage Starts on: 02/21/2020 12:00:00 AM, Coverage Ends on: 02/20/2021 12:00:00 AM.

## 2020-02-24 ENCOUNTER — Institutional Professional Consult (permissible substitution): Payer: Medicaid Other | Admitting: Diagnostic Neuroimaging

## 2020-03-04 ENCOUNTER — Other Ambulatory Visit: Payer: Self-pay

## 2020-03-04 ENCOUNTER — Ambulatory Visit (INDEPENDENT_AMBULATORY_CARE_PROVIDER_SITE_OTHER): Payer: Medicaid Other

## 2020-03-04 DIAGNOSIS — J4550 Severe persistent asthma, uncomplicated: Secondary | ICD-10-CM

## 2020-03-05 ENCOUNTER — Other Ambulatory Visit: Payer: Self-pay | Admitting: Orthopaedic Surgery

## 2020-03-05 ENCOUNTER — Other Ambulatory Visit (HOSPITAL_COMMUNITY): Payer: Self-pay | Admitting: Orthopaedic Surgery

## 2020-03-05 DIAGNOSIS — M25511 Pain in right shoulder: Secondary | ICD-10-CM

## 2020-03-09 ENCOUNTER — Other Ambulatory Visit: Payer: Self-pay

## 2020-03-09 ENCOUNTER — Ambulatory Visit (HOSPITAL_COMMUNITY)
Admission: RE | Admit: 2020-03-09 | Discharge: 2020-03-09 | Disposition: A | Discharge status: Home / Self Care | Payer: Medicaid Other | Source: Ambulatory Visit | Attending: Orthopaedic Surgery | Admitting: Orthopaedic Surgery

## 2020-03-09 DIAGNOSIS — M25511 Pain in right shoulder: Secondary | ICD-10-CM

## 2020-03-10 ENCOUNTER — Encounter: Payer: Self-pay | Admitting: Emergency Medicine

## 2020-03-10 ENCOUNTER — Ambulatory Visit
Admission: EM | Admit: 2020-03-10 | Discharge: 2020-03-10 | Disposition: A | Discharge status: Home / Self Care | Payer: Medicaid Other | Attending: Emergency Medicine | Admitting: Emergency Medicine

## 2020-03-10 DIAGNOSIS — R52 Pain, unspecified: Secondary | ICD-10-CM | POA: Diagnosis not present

## 2020-03-10 MED ORDER — KETOROLAC TROMETHAMINE 60 MG/2ML IM SOLN
60.0000 mg | Freq: Once | INTRAMUSCULAR | Status: AC
  Administered 2020-03-10: 60 mg via INTRAMUSCULAR

## 2020-03-10 MED ORDER — CYCLOBENZAPRINE HCL 5 MG PO TABS: 5.0000 mg | ORAL_TABLET | Freq: Three times a day (TID) | ORAL | 0 refills | Status: DC | PRN

## 2020-03-10 MED ORDER — NAPROXEN 500 MG PO TABS: 500.0000 mg | ORAL_TABLET | Freq: Two times a day (BID) | ORAL | 0 refills | Status: DC

## 2020-03-10 MED ORDER — PREDNISONE 10 MG PO TABS: 20.0000 mg | ORAL_TABLET | Freq: Every day | ORAL | 0 refills | Status: DC
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
## 2020-03-10 NOTE — Discharge Instructions (Addendum)
Rest, ice and heat as needed Ensure adequate range of motion as tolerated. Injuries all appear to be muscular in nature at this time Prescribed naproxen as needed for inflammation and pain relief.  DO NOT TAKE WITH OTHER antiinflammatories, as this may cause GI upset and/or bleed Prescribed low-dose prednisone Prescribed flexeril as needed at bedtime for muscle spasm.  Do not drive or operate heavy machinery while taking this medication Expect some increased pain in the next 1-3 days.  It may take 3-4 weeks for complete resolution of symptoms Will f/u with her doctor or here if not seeing significant improvement within one week. Return here or go to ER if you have any new or worsening symptoms such as numbness/tingling of the inner thighs, loss of bladder or bowel control, headache/blurry vision, nausea/vomiting, confusion/altered mental status, dizziness, weakness, passing out, imbalance, etc..Marland Kitchen
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
## 2020-03-16 ENCOUNTER — Ambulatory Visit (HOSPITAL_COMMUNITY): Payer: Medicaid Other

## 2020-04-29 ENCOUNTER — Other Ambulatory Visit: Payer: Self-pay

## 2020-04-29 ENCOUNTER — Ambulatory Visit (INDEPENDENT_AMBULATORY_CARE_PROVIDER_SITE_OTHER): Payer: Medicaid Other

## 2020-04-29 DIAGNOSIS — J4550 Severe persistent asthma, uncomplicated: Secondary | ICD-10-CM | POA: Diagnosis not present

## 2020-05-04 ENCOUNTER — Other Ambulatory Visit: Payer: Self-pay | Admitting: Allergy

## 2020-05-06 ENCOUNTER — Other Ambulatory Visit: Payer: Self-pay | Admitting: Allergy

## 2020-05-21 HISTORY — PX: OTHER SURGICAL HISTORY: SHX169

## 2020-06-16 DIAGNOSIS — U071 COVID-19: Secondary | ICD-10-CM | POA: Insufficient documentation

## 2020-06-24 ENCOUNTER — Ambulatory Visit: Payer: Self-pay

## 2020-06-29 ENCOUNTER — Telehealth: Payer: Self-pay

## 2020-06-29 NOTE — Telephone Encounter (Signed)
Patient called and stated that she missed her [place] appointment due to the office closing due to weather. Patient informed me that she is starting a new job and will not be able to make to the [place] office on Wednesday or Friday. She stated her only days off are ,Monday and Tuesday. Patient asked about coming to [facility] office. I did inform patient that her medication is in [place] however I can get it and have it in [place] on Monday 07/06/2020. Patient verbalized understanding.

## 2020-07-02 NOTE — Telephone Encounter (Signed)
Patient's Sharon Vazquez was brought from [place] to [facility]. Will give to Anderson Malta to bring to [facility] tomorrow 07/03/2020.

## 2020-07-03 NOTE — Telephone Encounter (Signed)
Sharon Vazquez Is in the [place] office for patients appointment today.

## 2020-07-06 ENCOUNTER — Other Ambulatory Visit: Payer: Self-pay

## 2020-07-06 ENCOUNTER — Ambulatory Visit (INDEPENDENT_AMBULATORY_CARE_PROVIDER_SITE_OTHER): Payer: Medicaid Other

## 2020-07-06 DIAGNOSIS — J4550 Severe persistent asthma, uncomplicated: Secondary | ICD-10-CM | POA: Diagnosis not present

## 2020-07-07 ENCOUNTER — Other Ambulatory Visit: Payer: Self-pay | Admitting: Adult Health

## 2020-07-07 ENCOUNTER — Other Ambulatory Visit: Payer: Self-pay | Admitting: Student in an Organized Health Care Education/Training Program

## 2020-07-21 ENCOUNTER — Telehealth (INDEPENDENT_AMBULATORY_CARE_PROVIDER_SITE_OTHER): Payer: Self-pay

## 2020-07-21 ENCOUNTER — Telehealth (INDEPENDENT_AMBULATORY_CARE_PROVIDER_SITE_OTHER): Payer: Medicaid Other | Admitting: Internal Medicine

## 2020-07-21 ENCOUNTER — Encounter (INDEPENDENT_AMBULATORY_CARE_PROVIDER_SITE_OTHER): Payer: Self-pay | Admitting: Internal Medicine

## 2020-07-21 ENCOUNTER — Other Ambulatory Visit: Payer: Self-pay

## 2020-07-21 ENCOUNTER — Other Ambulatory Visit (INDEPENDENT_AMBULATORY_CARE_PROVIDER_SITE_OTHER): Payer: Self-pay

## 2020-07-21 VITALS — Ht 67.0 in | Wt 190.0 lb

## 2020-07-21 DIAGNOSIS — K219 Gastro-esophageal reflux disease without esophagitis: Secondary | ICD-10-CM

## 2020-07-21 DIAGNOSIS — Z1211 Encounter for screening for malignant neoplasm of colon: Secondary | ICD-10-CM

## 2020-07-21 DIAGNOSIS — Z860100 Personal history of colon polyps, unspecified: Secondary | ICD-10-CM

## 2020-07-21 DIAGNOSIS — K5909 Other constipation: Secondary | ICD-10-CM

## 2020-07-21 DIAGNOSIS — Z860101 Personal history of adenomatous and serrated colon polyps: Secondary | ICD-10-CM

## 2020-07-21 MED ORDER — ESOMEPRAZOLE MAGNESIUM 40 MG PO CPDR
40.0000 mg | DELAYED_RELEASE_CAPSULE | Freq: Every day | ORAL | 3 refills | Status: DC
Start: 2020-07-21 — End: 2021-07-20

## 2020-07-21 MED ORDER — NA SULFATE-K SULFATE-MG SULF 17.5-3.13-1.6 GM/177ML PO SOLN: 354.0000 mL | Freq: Once | ORAL | 0 refills | Status: AC

## 2020-07-21 MED ORDER — TRULANCE 3 MG PO TABS
3.0000 mg | ORAL_TABLET | Freq: Every day | ORAL | 3 refills | Status: DC | PRN
Start: 2020-07-21 — End: 2021-11-10

## 2020-07-21 NOTE — Progress Notes (Signed)
Virtual Visit via Telephone Note  I connected with Sharon Vazquez on 07/21/20 at  8:45 AM EST by telephone and verified that I am speaking with the correct person using two identifiers.  Location: Patient: home Provider: office   I discussed the limitations, risks, security and privacy concerns of performing an evaluation and management service by telephone and the availability of in person appointments. I also discussed with the patient that there may be a patient responsible charge related to this service. The patient expressed understanding and agreed to proceed.   History of Present Illness:  Patient is 40 year old African-American female who has chronic GERD maintained on PPI and dietary measures as well as chronic constipation and history of colonic adenomas. Patient states esomeprazole is working well.  She needs a refill.  She rarely has heartburn.  She denies dysphagia or throat symptoms.  She says her bowels move 4-5 times per week.  She is not taking plecanatide daily but every now and then she takes 2 doses.  She is not experiencing any side effects.  She denies melena or rectal bleeding.  Patient says she is not taking NSAIDs daily.  When she takes diclofenac she does not take ibuprofen.  She says she had right shoulder arthroscopy for rotator cuff tear on 05/21/2020 and still having some pain.  She is using inhaler no more than twice a month.  She says she is not using Spiriva at the present time. Patient says she has very good appetite.  She has gained 23 pounds since her last visit 1 year ago.  She says she has been eating too many fried foods lately.  She says she will start watching her diet and calorie intake.  Current Outpatient Medications:  .  albuterol (PROVENTIL) (2.5 MG/3ML) 0.083% nebulizer solution, Take 3 mLs (2.5 mg total) by nebulization every 4 (four) hours as needed for wheezing or shortness of breath., Disp: 75 mL, Rfl: 1 .  albuterol (VENTOLIN HFA) 108 (90  Base) MCG/ACT inhaler, INHALE 1-2 PUFFS EVERY 6 HOURS AS NEEDED FOR WHEEZING., Disp: 8.5 g, Rfl: 1 .  ALPRAZolam (XANAX XR) 2 MG 24 hr tablet, Take 2 mg by mouth 2 (two) times daily. , Disp: , Rfl:  .  azelastine (OPTIVAR) 0.05 % ophthalmic solution, Place 1 drop into both eyes 2 (two) times daily., Disp: 6 mL, Rfl: 5 .  budesonide-formoterol (SYMBICORT) 160-4.5 MCG/ACT inhaler, Inhale 2 puffs into the lungs 2 (two) times daily., Disp: 1 each, Rfl: 5 .  Cobalamin Combinations (B12 FOLATE) 800-800 MCG CAPS, Take 1 capsule by mouth daily., Disp: , Rfl:  .  cyclobenzaprine (FLEXERIL) 5 MG tablet, Take 1 tablet (10 mg total) by mouth 2(two) times daily as needed., Disp: 30 tablet, Rfl: 0 .  diclofenac (VOLTAREN) 75 MG EC tablet, Take 1 tablet (75 mg total) by mouth 2 (two) times daily as needed for moderate pain. Take 1 tablet by mouth two times daily after meals for inflammation, pain, and swelling., Disp: 60 tablet, Rfl: 2 .  EPINEPHrine 0.3 mg/0.3 mL IJ SOAJ injection, Inject 0.3 mg into the muscle as needed for anaphylaxis., Disp: 2 each, Rfl: 0 .  esomeprazole (NEXIUM) 40 MG capsule, Take 1 capsule (40 mg total) by mouth daily before breakfast., Disp: 90 capsule, Rfl: 3 .  FASENRA 30 MG/ML SOSY, INJECT THE CONTENTS OF 1 SYRINGE (30 MG) UNDER THE SKIN EVERY 8 WEEKS, Disp: 1 mL, Rfl: 6 .  IBU 800 MG tablet, Take 800 mg by mouth every  6 (six) hours as needed., Disp: , Rfl:  .  lamoTRIgine (LAMICTAL) 150 MG tablet, Take 150 mg by mouth daily. , Disp: , Rfl:  .  loratadine (CLARITIN) 10 MG tablet, Take 10 mg by mouth 2 (two) times daily., Disp: , Rfl:  .  metoprolol succinate (TOPROL-XL) 100 MG 24 hr tablet, Take 100 mg by mouth daily. , Disp: , Rfl:  .  montelukast (SINGULAIR) 10 MG tablet, Take 1 tablet (10 mg total) by mouth at bedtime., Disp: 30 tablet, Rfl: 5 .  Plecanatide (TRULANCE) 3 MG TABS, Take 3 mg by mouth daily as needed., Disp: 30 tablet, Rfl: 5 .  Prenatal Vit-Fe Fumarate-FA (M-NATAL  PLUS) 27-1 MG TABS, TAKE 1 TABLET ONCE DAILY AT 12 NOON, Disp: 30 tablet, Rfl: 0 .  Tiotropium Bromide Monohydrate (SPIRIVA RESPIMAT) 1.25 MCG/ACT AERS, Inhale 2 puffs into the lungs daily., Disp: 4 g, Rfl: 5 .  topiramate (TOPAMAX) 25 MG tablet, Take 25 mg by mouth at bedtime., Disp: , Rfl:  .  triamcinolone ointment (KENALOG) 0.1 %, Apply 1 application topically 2 (two) times daily as needed., Disp: 80 g, Rfl: 5 .  zolpidem (AMBIEN) 10 MG tablet, Take 10 mg by mouth at bedtime., Disp: , Rfl:  .  fluticasone (FLONASE) 50 MCG/ACT nasal spray, Place 1 spray into both nostrils daily for 14 days., Disp: 16 g, Rfl: 0 .  gabapentin (NEURONTIN) 300 MG capsule, Take 2 capsules (600 mg total) by mouth 3 (three) times daily., Disp: 180 capsule, Rfl: 2 .  ipratropium (ATROVENT) 0.06 % nasal spray, Place 2 sprays into both nostrils 3 (three) times daily. (Patient not taking: Reported on 07/21/2020), Disp: 15 mL, Rfl: 5 .  polycarbophil (FIBERCON) 625 MG tablet, Take 2 tablets (1,250 mg total) by mouth daily. (Patient not taking: No sig reported), Disp: 60 tablet, Rfl: 5  Current Facility-Administered Medications:  .  Benralizumab SOSY 30 mg, 30 mg, Subcutaneous, Q8 Weeks, Kennith Gain, MD, 30 mg at 07/06/20 5009  Medication list reviewed with patient and updated.  Observations/Objective:  Patient reported her weight to be 190 pounds.  Patient weighed 167 pounds on 05/21/2020.  Assessment and Plan:  #1.  Chronic GERD.    She is doing well with therapy.  Medication will be refilled.  #2.  Chronic constipation.  She appears to be doing well with plecanatide.  Prescription would be refilled  #3.  History of colonic polyps.  She had her first colonoscopy in March 2014 for hematochezia.  She had 10 mm cecal sessile serrated polyp with dysplasia.  Follow-up colonoscopy in April 2017 revealed small tubular adenoma.    She is due for colonoscopy in April this year.  #4.  Weight gain.  Patient  is aware that she has gained 23 pounds since her last visit 1 year ago.  She weighed 242 pounds on December 22, 2018 2018.  Patient is is trying to go back to healthy diet and eliminate fatty and fried foods and also watch calorie intake.  #5.  NSAID use.  Patient informed that she should not take both NSAIDs together.  She should take 1 or the other and keep use to minimum in order to reduce the risk of GI or kidney injury.  Plan Prescription for esomeprazole and plecanatide sent to patient's pharmacy for 90 days with 3 refills. Patient advised to resume FiberCon as before. Schedule surveillance colonoscopy in April of this year under monitored anesthesia care per  Follow Up Instructions:  Office visit  in 1 year.  I discussed the assessment and treatment plan with the patient. The patient was provided an opportunity to ask questions and all were answered. The patient agreed with the plan and demonstrated an understanding of the instructions.   The patient was advised to call back or seek an in-person evaluation if the symptoms worsen or if the condition fails to improve as anticipated.  I provided 16 minutes of non-face-to-face time during this encounter.   Hildred Laser, MD

## 2020-07-21 NOTE — Telephone Encounter (Signed)
Sharon Vazquez, CMA  

## 2020-07-23 ENCOUNTER — Encounter (INDEPENDENT_AMBULATORY_CARE_PROVIDER_SITE_OTHER): Payer: Self-pay

## 2020-07-28 ENCOUNTER — Other Ambulatory Visit: Payer: Self-pay | Admitting: Adult Health
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
## 2020-08-19 ENCOUNTER — Ambulatory Visit
Admission: EM | Admit: 2020-08-19 | Discharge: 2020-08-19 | Disposition: A | Discharge status: Home / Self Care | Payer: Medicaid Other | Attending: Family Medicine | Admitting: Family Medicine

## 2020-08-19 ENCOUNTER — Other Ambulatory Visit: Payer: Self-pay

## 2020-08-19 DIAGNOSIS — G43019 Migraine without aura, intractable, without status migrainosus: Secondary | ICD-10-CM

## 2020-08-19 DIAGNOSIS — M542 Cervicalgia: Secondary | ICD-10-CM

## 2020-08-19 DIAGNOSIS — R110 Nausea: Secondary | ICD-10-CM

## 2020-08-19 HISTORY — DX: Migraine, unspecified, not intractable, without status migrainosus: G43.909

## 2020-08-19 MED ORDER — KETOROLAC TROMETHAMINE 60 MG/2ML IM SOLN
60.0000 mg | Freq: Once | INTRAMUSCULAR | Status: AC
  Administered 2020-08-19: 60 mg via INTRAMUSCULAR

## 2020-08-19 MED ORDER — DEXAMETHASONE SODIUM PHOSPHATE 10 MG/ML IJ SOLN
10.0000 mg | Freq: Once | INTRAMUSCULAR | Status: AC
  Administered 2020-08-19: 10 mg via INTRAMUSCULAR

## 2020-08-19 MED ORDER — ONDANSETRON 4 MG PO TBDP: 4.0000 mg | ORAL_TABLET | Freq: Three times a day (TID) | ORAL | 0 refills | Status: DC | PRN

## 2020-08-19 NOTE — ED Triage Notes (Signed)
Patient presents to Urgent Care with complaints of a headache that started yesterday. She reports she has a hx of mirgraines has treating discomfort with Topamax.
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
## 2020-08-31 ENCOUNTER — Ambulatory Visit: Payer: Self-pay

## 2020-09-03 ENCOUNTER — Other Ambulatory Visit: Payer: Self-pay

## 2020-09-03 ENCOUNTER — Telehealth: Payer: Self-pay

## 2020-09-03 ENCOUNTER — Other Ambulatory Visit: Payer: Self-pay | Admitting: Allergy

## 2020-09-03 ENCOUNTER — Ambulatory Visit (INDEPENDENT_AMBULATORY_CARE_PROVIDER_SITE_OTHER): Payer: Medicaid Other | Admitting: *Deleted

## 2020-09-03 DIAGNOSIS — J4550 Severe persistent asthma, uncomplicated: Secondary | ICD-10-CM

## 2020-09-03 NOTE — Telephone Encounter (Signed)
A courtesy refill of Loratadine was sent in to patient's preferred pharmacists.  Patient was informed of courtesy refill and to make sure she keep appointment 11/03/2020 @ 9:30.  Patient verbally stated she understood.

## 2020-09-10 ENCOUNTER — Encounter (INDEPENDENT_AMBULATORY_CARE_PROVIDER_SITE_OTHER): Payer: Self-pay

## 2020-09-14 ENCOUNTER — Encounter (INDEPENDENT_AMBULATORY_CARE_PROVIDER_SITE_OTHER): Payer: Self-pay

## 2020-09-21 ENCOUNTER — Other Ambulatory Visit (HOSPITAL_COMMUNITY): Payer: Medicaid Other

## 2020-09-21 ENCOUNTER — Encounter (HOSPITAL_COMMUNITY): Admission: RE | Admit: 2020-09-21 | Payer: Medicaid Other | Source: Ambulatory Visit

## 2020-09-29 ENCOUNTER — Other Ambulatory Visit: Payer: Self-pay

## 2020-09-29 ENCOUNTER — Encounter: Payer: Self-pay | Admitting: Family Medicine

## 2020-09-29 ENCOUNTER — Ambulatory Visit (INDEPENDENT_AMBULATORY_CARE_PROVIDER_SITE_OTHER): Payer: Medicaid Other | Admitting: Family Medicine

## 2020-09-29 VITALS — BP 116/70 | HR 91 | Temp 98.2°F | Resp 16

## 2020-09-29 DIAGNOSIS — H1013 Acute atopic conjunctivitis, bilateral: Secondary | ICD-10-CM | POA: Diagnosis not present

## 2020-09-29 DIAGNOSIS — T6391XD Toxic effect of contact with unspecified venomous animal, accidental (unintentional), subsequent encounter: Secondary | ICD-10-CM

## 2020-09-29 DIAGNOSIS — K219 Gastro-esophageal reflux disease without esophagitis: Secondary | ICD-10-CM

## 2020-09-29 DIAGNOSIS — J4540 Moderate persistent asthma, uncomplicated: Secondary | ICD-10-CM

## 2020-09-29 DIAGNOSIS — J3089 Other allergic rhinitis: Secondary | ICD-10-CM | POA: Diagnosis not present

## 2020-09-29 DIAGNOSIS — J0100 Acute maxillary sinusitis, unspecified: Secondary | ICD-10-CM | POA: Insufficient documentation

## 2020-09-29 MED ORDER — MONTELUKAST SODIUM 10 MG PO TABS: 10.0000 mg | ORAL_TABLET | Freq: Every day | ORAL | 5 refills | Status: DC

## 2020-09-29 MED ORDER — BUDESONIDE-FORMOTEROL FUMARATE 160-4.5 MCG/ACT IN AERO: INHALATION_SPRAY | RESPIRATORY_TRACT | 5 refills | Status: DC

## 2020-09-29 MED ORDER — EPINEPHRINE 0.3 MG/0.3ML IJ SOAJ: 0.3000 mg | INTRAMUSCULAR | 0 refills | Status: DC | PRN

## 2020-09-29 MED ORDER — LEVOCETIRIZINE DIHYDROCHLORIDE 5 MG PO TABS: 5.0000 mg | ORAL_TABLET | Freq: Every evening | ORAL | 5 refills | Status: DC

## 2020-09-29 NOTE — Patient Instructions (Addendum)
Asthma Continue montelukast 10 mg once a day to prevent cough and wheeze Continue albuterol 2 puffs every 4 hours as needed for cough or wheeze OR Instead use albuterol 0.083% solution via nebulizer one unit vial every 4 hours as needed for cough or wheeze Continue receiving Fasenra injections once every 8 weeks and have access to an epinephrine injector set  Sinusitis Prednisone 10 mg tablets. Take 2 tablets once a day for 4 days, then take 1 tablet on the 5th day, then stop.  Continue saline nasal rinses several times a day Stop antihistamines for the next week. Continue Mucinex [id number] mg twice a day and increase hydration as tolerated to thin out mucus Call the clinic for worsening symptoms or if you develop a fever  Allergic rhinitis Start Xyzal 5 mg once a day as needed for a runny nose.Remember to rotate to a different antihistamine about every 3 months. Some examples of over the counter antihistamines include Zyrtec (cetirizine), Xyzal (levocetirizine), Allegra (fexofenadine), and Claritin (loratidine).  Continue montelukast Continue Atrovent 2 sprays in each nostril twice a day as needed for runny nose Continue Flonase 1-2 sprays in each nostril once a day as needed for a stuffy nose  Allergic conjunctivitis Continue azelastine eyedrops 1 drop in each eye twice a day as needed for red or itchy eyes You may use natural tears as needed  Reflux Continue dietary and lifestyle modifications as listed below Continue esomeprazole 40 mg once a day as previously prescribed by your gastroenterologist  Stinging insect allergy Continue to avoid stinging insects.  In case of an allergic reaction, take Benadryl 50 mg every 4 hours, and if life-threatening symptoms occur, inject with EpiPen 0.3 mg.  Call the clinic if this treatment plan is not working well for you  Follow up in 4 weeks or sooner if needed.

## 2020-09-29 NOTE — Progress Notes (Addendum)
Sharon Vazquez [id number] Dept: [id number]  FOLLOW UP NOTE  Patient ID: Sharon Vazquez, female    DOB: Oct 12, 1980  Age: 40 y.o. MRN: [id number] Date of Office Visit: 09/29/2020  Assessment  Chief Complaint: Allergic Rhinitis   HPI Sharon Vazquez is a 40 year old female who presents to the clinic for follow-up visit.  She was last seen in this clinic on 02/20/2020 by Dr. 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  At today's visit, she reports that on Friday, 5 days ago, she began to experience cough, nasal congestion, clear rhinorrhea, occasional sneeze, and occasional postnasal drainage.  She reports that at that time, she began to take Augmentin 875 mg that she had leftover from a sinus fraction in August, Tessalon [place], and Mucinex.  She reports that she continued her regular medication including Claritin 10 mg once a day, montelukast 10 mg once a day, occasional saline nasal rinses and occasional azelastine nasal spray and Flonase nasal spray with no relief of symptoms.  Asthma is reported as well controlled with no shortness of breath or wheeze with activity or rest.  She does report cough producing clear thick mucus that began on Friday.  She uses albuterol about once every 2 months and continues Fasenra injections once every 8 weeks.  She reports that she has not used Symbicort 160 or Spiriva 1.25 mcg since her last visit to this clinic.  Allergic conjunctivitis is reported as moderately well controlled with symptoms including redness and itch for which she had previously used azelastine ophthalmic drops, however, is currently using Visine multisymptom drops with relief of symptoms.  She has not had any contact with stinging insects nor has she used her epinephrine autoinjector for stinging insect allergy since her last visit to this clinic.  She does report use of the beta-blocker.  She does not always  keep a set of epinephrine autoinjector devices accessible at all times.  She briefly considered a course of venom immunotherapy directed toward honeybee, mixed vespid, and wasp in 2018, however, chose not to move forward with this.  Her current medications are listed in the chart.  Drug Allergies:  Allergies  Allergen Reactions   Bee Venom Anaphylaxis   Dilaudid [Hydromorphone Hcl] Anaphylaxis and Hives    Has tolerated morphine since this reaction with Benadryl   Peanut Oil Anaphylaxis   Senna Anaphylaxis and Hives   Adhesive [Tape] Hives and Other (See Comments)    Pulls skin off (use paper tape)   Latex Hives    Physical Exam: BP 116/70   Pulse 91   Temp 98.2 F (36.8 C) (Temporal)   Resp 16   SpO2 97%    Physical Exam Vitals reviewed.  Constitutional:      Appearance: Normal appearance.  HENT:     Head: Normocephalic and atraumatic.     Right Ear: Tympanic membrane normal.     Left Ear: Tympanic membrane normal.     Nose:     Comments: Bilateral nares edematous and pale with clear nasal drainage noted.  Pharynx normal.  Ears normal.  Eyes normal.    Mouth/Throat:     Pharynx: Oropharynx is clear.  Eyes:     Conjunctiva/sclera: Conjunctivae normal.  Cardiovascular:     Rate and Rhythm: Normal rate and regular rhythm.     Heart sounds: Normal heart sounds. No murmur heard.   Pulmonary:     Effort: Pulmonary effort is normal.  Breath sounds: Normal breath sounds.     Comments: Lungs clear to auscultation Musculoskeletal:        General: Normal range of motion.     Cervical back: Normal range of motion and neck supple.  Skin:    General: Skin is warm and dry.  Neurological:     Mental Status: She is alert and oriented to person, place, and time.  Psychiatric:        Mood and Affect: Mood normal.        Behavior: Behavior normal.        Thought Content: Thought content normal.        Judgment: Judgment normal.     Diagnostics: FVC 3.38, FEV1 2.51.   Predicted FVC 3.34, predicted FEV1 2.76.  Spirometry indicates normal ventilatory function.  Assessment and Plan: 1. Moderate persistent asthma without complication   2. Non-seasonal allergic rhinitis due to other allergic trigger   3. Allergic conjunctivitis of both eyes   4. Toxic effect of venom, accidental or unintentional, subsequent encounter   5. Acute non-recurrent maxillary sinusitis   6. Gastroesophageal reflux disease, unspecified whether esophagitis present     Meds ordered this encounter  Medications   budesonide-formoterol (SYMBICORT) 160-4.5 MCG/ACT inhaler    Sig: For asthma flare, begin Symbicort 160-2 puffs twice a day for 2 weeks or until cough and wheeze free    Dispense:  1 each    Refill:  5   montelukast (SINGULAIR) 10 MG tablet    Sig: Take 1 tablet (10 mg total) by mouth at bedtime.    Dispense:  30 tablet    Refill:  5   EPINEPHrine 0.3 mg/0.3 mL IJ SOAJ injection    Sig: Inject 0.3 mg into the muscle as needed for anaphylaxis.    Dispense:  2 each    Refill:  0    Please dispense Mylan or Teva brand generic only.   levocetirizine (XYZAL) 5 MG tablet    Sig: Take 1 tablet (5 mg total) by mouth every evening.    Dispense:  30 tablet    Refill:  5    Patient Instructions  Asthma Continue montelukast 10 mg once a day to prevent cough and wheeze Continue albuterol 2 puffs every 4 hours as needed for cough or wheeze OR Instead use albuterol 0.083% solution via nebulizer one unit vial every 4 hours as needed for cough or wheeze Continue receiving Fasenra injections once every 8 weeks and have access to an epinephrine injector set  Sinusitis Prednisone 10 mg tablets. Take 2 tablets once a day for 4 days, then take 1 tablet on the 5th day, then stop.  Continue saline nasal rinses several times a day Stop antihistamines for the next week. Continue Mucinex [phone] mg twice a day and increase hydration as tolerated to thin out mucus Call the clinic for  worsening symptoms or if you develop a fever  Allergic rhinitis Start Xyzal 5 mg once a day as needed for a runny nose.Remember to rotate to a different antihistamine about every 3 months. Some examples of over the counter antihistamines include Zyrtec (cetirizine), Xyzal (levocetirizine), Allegra (fexofenadine), and Claritin (loratidine).  Continue montelukast Continue Atrovent 2 sprays in each nostril twice a day as needed for runny nose Continue Flonase 1-2 sprays in each nostril once a day as needed for a stuffy nose  Allergic conjunctivitis Continue azelastine eyedrops 1 drop in each eye twice a day as needed for red or itchy eyes You  may use natural tears as needed  Reflux Continue dietary and lifestyle modifications as listed below Continue esomeprazole 40 mg once a day as previously prescribed by your gastroenterologist  Stinging insect allergy Continue to avoid stinging insects.  In case of an allergic reaction, take Benadryl 50 mg every 4 hours, and if life-threatening symptoms occur, inject with EpiPen 0.3 mg.  Call the clinic if this treatment plan is not working well for you  Follow up in 4 weeks or sooner if needed.   Return in about 4 weeks (around 10/27/2020), or if symptoms worsen or fail to improve.    Thank you for the opportunity to care for this patient.  Please do not hesitate to contact me with questions.  Gareth Morgan, FNP [facility] [place] [facility]  I have provided oversight concerning Gareth Morgan' evaluation and treatment of this patient's health issues addressed during today's encounter. I agree with the assessment and therapeutic plan as outlined in the note.   Signed,   Jiles Prows, MD,  Allergy and Immunology,  [place].

## 2020-10-29 ENCOUNTER — Encounter (INDEPENDENT_AMBULATORY_CARE_PROVIDER_SITE_OTHER): Payer: Self-pay

## 2020-11-03 ENCOUNTER — Telehealth: Payer: Self-pay | Admitting: *Deleted

## 2020-11-03 ENCOUNTER — Other Ambulatory Visit: Payer: Self-pay

## 2020-11-03 ENCOUNTER — Ambulatory Visit: Payer: Self-pay

## 2020-11-03 ENCOUNTER — Ambulatory Visit (INDEPENDENT_AMBULATORY_CARE_PROVIDER_SITE_OTHER): Payer: Medicaid Other | Admitting: *Deleted

## 2020-11-03 DIAGNOSIS — J4540 Moderate persistent asthma, uncomplicated: Secondary | ICD-10-CM

## 2020-11-03 DIAGNOSIS — J4550 Severe persistent asthma, uncomplicated: Secondary | ICD-10-CM

## 2020-11-03 NOTE — Telephone Encounter (Signed)
She can definitely maximize the medications listed from her last visit including increasing nasal saline rinses followed by flonase. Is she having a stuffy nose? Any reflux which is worse at night? Still taking esomeprazole? Thank you

## 2020-11-03 NOTE — Telephone Encounter (Signed)
Patient came in for her Berna Bue injection today and stated that she is feeling better since her office visit last month but she does still have some cough and "wheezing in her throat" she is still taking the Mucinex 1200 mg 2 times daily. She is wondering if there is anything else you may recommend? She stated that she was feeling good when I asked her before giving her injection and then stated this issue after receiving her injection. Please route to clinic pool. Thank You.

## 2020-11-03 NOTE — Telephone Encounter (Signed)
Per Webb Silversmith: She can definitely maximize the medications listed from her last visit including increasing nasal saline rinses followed by flonase. Is she having a stuffy nose? Any reflux which is worse at night? Still taking esomeprazole? Thank you

## 2020-11-04 NOTE — Telephone Encounter (Signed)
Called, left a message for patient to call the office regarding this matter.

## 2020-11-06 NOTE — Telephone Encounter (Signed)
Spoke with patient, she stated that she doesn't have much of a runny nose. She said she isn't having any reflux since she takes her esomeprazole daily.

## 2020-11-06 NOTE — Telephone Encounter (Signed)
Left a message for patient to call the office, will need to inform her of Anne's recommendation.

## 2020-11-06 NOTE — Telephone Encounter (Signed)
Can you please have her restart her Symbicort 160-2 puffs twice a day with a spacer? This will take care of asthma. Please have her continue saline nasal rinses and Flonase. This will take care of drainage. If she is still having the wheezing in her throat after this, please have her call her gastroenterologist for advice regarding reflux. Thank you so much. She can also come into the clinic for evaluation anytime if she is not feeling any better.

## 2020-11-11 NOTE — Telephone Encounter (Signed)
Spoke with patient, she stated she doesn't feel like it is her reflux. She informed me that she takes her medication and hasn't had any problems. Patient feels it is her asthma that is causing her cough and would like a second opinion. She would like to ask for Dr. Jeralyn Ruths recommendation. Dr. Nelva Bush please advise.

## 2020-11-11 NOTE — Telephone Encounter (Signed)
We can have her try Breztri 2 puffs twice a day to step up from the Symbicort to see if this will help her symptoms.  If she would like to pick up samples from the office we can provide these for her to see if this will be more helpful than Symbicort.

## 2020-11-16 ENCOUNTER — Other Ambulatory Visit (HOSPITAL_COMMUNITY): Payer: Medicaid Other

## 2020-11-18 ENCOUNTER — Ambulatory Visit (HOSPITAL_COMMUNITY): Admit: 2020-11-18 | Payer: Medicaid Other | Admitting: Internal Medicine

## 2020-11-18 ENCOUNTER — Encounter (HOSPITAL_COMMUNITY): Payer: Self-pay

## 2020-11-18 SURGERY — COLONOSCOPY WITH PROPOFOL
Anesthesia: Monitor Anesthesia Care

## 2020-11-24 ENCOUNTER — Other Ambulatory Visit: Payer: Medicaid Other | Admitting: Adult Health

## 2020-12-11 ENCOUNTER — Emergency Department (HOSPITAL_COMMUNITY)
Admission: EM | Admit: 2020-12-11 | Discharge: 2020-12-11 | Disposition: A | Discharge status: Home / Self Care | Payer: Medicaid Other | Attending: Emergency Medicine | Admitting: Emergency Medicine

## 2020-12-11 ENCOUNTER — Other Ambulatory Visit: Payer: Self-pay

## 2020-12-11 ENCOUNTER — Encounter (HOSPITAL_COMMUNITY): Payer: Self-pay | Admitting: Emergency Medicine

## 2020-12-11 ENCOUNTER — Emergency Department (HOSPITAL_COMMUNITY): Payer: Medicaid Other

## 2020-12-11 DIAGNOSIS — R072 Precordial pain: Secondary | ICD-10-CM | POA: Diagnosis not present

## 2020-12-11 DIAGNOSIS — Z91010 Allergy to peanuts: Secondary | ICD-10-CM | POA: Insufficient documentation

## 2020-12-11 DIAGNOSIS — R0789 Other chest pain: Secondary | ICD-10-CM

## 2020-12-11 DIAGNOSIS — Z20822 Contact with and (suspected) exposure to covid-19: Secondary | ICD-10-CM | POA: Diagnosis not present

## 2020-12-11 DIAGNOSIS — Z7951 Long term (current) use of inhaled steroids: Secondary | ICD-10-CM | POA: Diagnosis not present

## 2020-12-11 DIAGNOSIS — Z91040 Latex allergy status: Secondary | ICD-10-CM | POA: Insufficient documentation

## 2020-12-11 DIAGNOSIS — I10 Essential (primary) hypertension: Secondary | ICD-10-CM | POA: Diagnosis not present

## 2020-12-11 DIAGNOSIS — F89 Unspecified disorder of psychological development: Secondary | ICD-10-CM | POA: Diagnosis not present

## 2020-12-11 DIAGNOSIS — J4540 Moderate persistent asthma, uncomplicated: Secondary | ICD-10-CM | POA: Insufficient documentation

## 2020-12-11 DIAGNOSIS — Z79899 Other long term (current) drug therapy: Secondary | ICD-10-CM | POA: Diagnosis not present

## 2020-12-11 DIAGNOSIS — Z87891 Personal history of nicotine dependence: Secondary | ICD-10-CM | POA: Diagnosis not present

## 2020-12-11 LAB — BASIC METABOLIC PANEL
Anion gap: 7 (ref 5–15)
BUN: 11 mg/dL (ref 6–20)
CO2: 23 mmol/L (ref 22–32)
Calcium: 9 mg/dL (ref 8.9–10.3)
Chloride: 105 mmol/L (ref 98–111)
Creatinine, Ser: 1.01 mg/dL — ABNORMAL HIGH (ref 0.44–1.00)
GFR, Estimated: >60 mL/min (ref >60)
Glucose, Bld: 87 mg/dL (ref 70–99)
Potassium: 4.1 mmol/L (ref 3.5–5.1)
Sodium: 135 mmol/L (ref 135–145)

## 2020-12-11 LAB — CBC WITH DIFFERENTIAL/PLATELET
Abs Immature Granulocytes: 0.03 10*3/uL (ref 0.00–0.07)
Basophils Absolute: 0 10*3/uL (ref 0.0–0.1)
Basophils Relative: 0 %
Eosinophils Absolute: 0 10*3/uL (ref 0.0–0.5)
Eosinophils Relative: 0 %
HCT: 40.1 % (ref 36.0–46.0)
Hemoglobin: 14.6 g/dL (ref 12.0–15.0)
Immature Granulocytes: 0 %
Lymphocytes Relative: 31 %
Lymphs Abs: 2.7 10*3/uL (ref 0.7–4.0)
MCH: 32.8 pg (ref 26.0–34.0)
MCHC: 36.4 g/dL — ABNORMAL HIGH (ref 30.0–36.0)
MCV: 90.1 fL (ref 80.0–100.0)
Monocytes Absolute: 0.6 10*3/uL (ref 0.1–1.0)
Monocytes Relative: 7 %
Neutro Abs: 5.3 10*3/uL (ref 1.7–7.7)
Neutrophils Relative %: 62 %
Platelets: 308 10*3/uL (ref 150–400)
RBC: 4.45 MIL/uL (ref 3.87–5.11)
RDW: 13.8 % (ref 11.5–15.5)
WBC: 8.7 10*3/uL (ref 4.0–10.5)
nRBC: 0 % (ref 0.0–0.2)

## 2020-12-11 LAB — TROPONIN I (HIGH SENSITIVITY)
Troponin I (High Sensitivity): <2 ng/L (ref <18)
Troponin I (High Sensitivity): <2 ng/L (ref <18)

## 2020-12-11 LAB — RESP PANEL BY RT-PCR (FLU A&B, COVID) ARPGX2
Influenza A by PCR: NEGATIVE
Influenza B by PCR: NEGATIVE
SARS Coronavirus 2 by RT PCR: NEGATIVE

## 2020-12-11 LAB — HCG, SERUM, QUALITATIVE: Preg, Serum: NEGATIVE

## 2020-12-11 MED ORDER — CYCLOBENZAPRINE HCL 10 MG PO TABS: 10.0000 mg | ORAL_TABLET | Freq: Two times a day (BID) | ORAL | 0 refills | Status: AC | PRN

## 2020-12-11 MED ORDER — CYCLOBENZAPRINE HCL 10 MG PO TABS
10.0000 mg | ORAL_TABLET | Freq: Once | ORAL | Status: AC
  Administered 2020-12-11: 10 mg via ORAL
  Filled 2020-12-11: qty 1
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
## 2020-12-11 NOTE — ED Notes (Signed)
Pt c/o generalized weakness since last week, reports being exposed to Covid at work

## 2020-12-11 NOTE — ED Triage Notes (Signed)
Pt c/o central chest pain that started x 2 hours ago.

## 2020-12-11 NOTE — Discharge Instructions (Addendum)
Lab work and imaging all reassuring, suspect you are having muscular strain of your chest.  Given you  a prescription for Flexeril please take as prescribed. this  Medication can make you drowsy do not consume alcohol or operate heavy machinery when taking this medication.  I also recommend continue with your Nexium as you may also have some acid reflux.  You can find this over-the-counter at your pharmacy.  Your COVID test as well as your urine pregnancy was both negative here today.  Please follow-up your PCP as needed.  Come back to the emergency department if you develop chest pain, shortness of breath, severe abdominal pain, uncontrolled nausea, vomiting, diarrhea.

## 2020-12-11 NOTE — ED Notes (Signed)
Lab at bedside

## 2020-12-28 ENCOUNTER — Ambulatory Visit: Payer: Self-pay

## 2021-01-01 ENCOUNTER — Ambulatory Visit: Payer: Medicaid Other

## 2021-01-01 ENCOUNTER — Other Ambulatory Visit: Payer: Self-pay

## 2021-01-01 DIAGNOSIS — J4540 Moderate persistent asthma, uncomplicated: Secondary | ICD-10-CM

## 2021-01-01 DIAGNOSIS — J4550 Severe persistent asthma, uncomplicated: Secondary | ICD-10-CM

## 2021-01-04 ENCOUNTER — Other Ambulatory Visit: Payer: Medicaid Other | Admitting: Adult Health

## 2021-01-04 ENCOUNTER — Telehealth: Payer: Self-pay

## 2021-01-04 NOTE — Telephone Encounter (Signed)
Patient would like to start getting her Fasenra injection at our [place] office due to being closer to her job. Patient scheduled next appointment at [facility] on 02/26/2021.

## 2021-01-26 ENCOUNTER — Other Ambulatory Visit: Payer: Self-pay | Admitting: Orthopedic Surgery

## 2021-01-26 DIAGNOSIS — M545 Low back pain, unspecified: Secondary | ICD-10-CM

## 2021-01-29 ENCOUNTER — Other Ambulatory Visit (HOSPITAL_COMMUNITY)
Admission: RE | Admit: 2021-01-29 | Discharge: 2021-01-29 | Disposition: A | Discharge status: Home / Self Care | Payer: Medicaid Other | Source: Ambulatory Visit | Attending: Adult Health | Admitting: Adult Health

## 2021-01-29 ENCOUNTER — Encounter: Payer: Self-pay | Admitting: Adult Health

## 2021-01-29 ENCOUNTER — Ambulatory Visit (INDEPENDENT_AMBULATORY_CARE_PROVIDER_SITE_OTHER): Payer: Medicaid Other | Admitting: Adult Health

## 2021-01-29 ENCOUNTER — Other Ambulatory Visit: Payer: Self-pay

## 2021-01-29 VITALS — BP 123/81 | HR 88 | Ht 66.0 in | Wt 199.0 lb

## 2021-01-29 DIAGNOSIS — Z319 Encounter for procreative management, unspecified: Secondary | ICD-10-CM | POA: Diagnosis not present

## 2021-01-29 DIAGNOSIS — Z01419 Encounter for gynecological examination (general) (routine) without abnormal findings: Secondary | ICD-10-CM | POA: Insufficient documentation

## 2021-01-29 DIAGNOSIS — Z1211 Encounter for screening for malignant neoplasm of colon: Secondary | ICD-10-CM

## 2021-01-29 DIAGNOSIS — B369 Superficial mycosis, unspecified: Secondary | ICD-10-CM | POA: Diagnosis not present

## 2021-01-29 LAB — HEMOCCULT GUIAC POC 1CARD (OFFICE): Fecal Occult Blood, POC: NEGATIVE

## 2021-01-29 MED ORDER — NYSTATIN 100000 UNIT/GM EX POWD: 1.0000 "application " | Freq: Three times a day (TID) | CUTANEOUS | 12 refills | Status: AC

## 2021-01-29 MED ORDER — PNV PRENATAL PLUS MULTIVITAMIN 27-1 MG PO TABS: ORAL_TABLET | ORAL | 12 refills | Status: DC

## 2021-01-29 NOTE — Patient Instructions (Signed)
[facility] [phone]   Thank you for choosing our office today! We appreciate the opportunity to meet your healthcare needs. You may receive a short survey by e-mail or through [place]. If you are happy with your care we would appreciate if you could take just a few minutes to complete the survey questions. We read all of your comments and take your feedback very seriously. Thank you again for choosing our office.   [facility] [employer]

## 2021-01-29 NOTE — Progress Notes (Signed)
Patient ID: Sharon Vazquez, female   DOB: [birth year], 40 y.o.   MRN: [id number] History of Present Illness: Sharon Vazquez is a 40 year old black female, married, G0P0, in for a well woman gyn exam and pap. She is having regular periods and says she would like to get pregnant.   PCP is Dr Posey Pronto.  Current Medications, Allergies, Past Medical History, Past Surgical History, Family History and Social History were reviewed in [employer] record.     Review of Systems: Patient denies any headaches, hearing loss, fatigue, blurred vision, shortness of breath, chest pain, abdominal pain, problems with bowel movements, urination, or intercourse. No joint pain or mood swings.  Periods regular     Physical Exam:BP 123/81 (BP Location: Left Arm, Patient Position: Sitting, Cuff Size: Normal)   Pulse 88   Ht '5\' 6"'$  (1.676 m)   Wt 199 lb (90.3 kg)   LMP 01/11/2021   BMI 32.12 kg/m   General:  Well developed, well nourished, no acute distress Skin:  Warm and dry Neck:  Midline trachea, normal thyroid, good ROM, no lymphadenopathy Lungs; Clear to auscultation bilaterally Breast:  No dominant palpable mass, retraction, or nipple discharge,has mild skin fungus under breasts, she sweats at work Cardiovascular: Regular rate and rhythm Abdomen:  Soft, non tender, no hepatosplenomegaly Pelvic:  External genitalia is normal in appearance, no lesions.  The vagina is normal in appearance. Urethra has no lesions or masses. The cervix is smooth, pap with GC.CHL and HR HPV genotyping performed.  Uterus is felt to be normal size, shape, and contour.  No adnexal masses or tenderness noted.Bladder is non tender, no masses felt. Rectal: Good sphincter tone, no polyps, or hemorrhoids felt.  Hemoccult negative. Extremities/musculoskeletal:  No swelling or varicosities noted, no clubbing or cyanosis Psych:  No mood changes, alert and cooperative,seems happy AA is 2 Fall risk is low Depression screen  [facility] 2/9 01/29/2021 01/23/2019 12/19/2018  Decreased Interest 1 0 0  Down, Depressed, Hopeless 1 0 0  PHQ - 2 Score 2 0 0  Altered sleeping 1 - -  Tired, decreased energy 1 - -  Change in appetite 2 - -  Feeling bad or failure about yourself  0 - -  Trouble concentrating 2 - -  Moving slowly or fidgety/restless 0 - -  Suicidal thoughts 0 - -  PHQ-9 Score 8 - -  Difficult doing work/chores - - -  Some recent data might be hidden    GAD 7 : Generalized Anxiety Score 01/29/2021  Nervous, Anxious, on Edge 1  Control/stop worrying 2  Worry too much - different things 2  Trouble relaxing 1  Restless 0  Easily annoyed or irritable 3  Afraid - awful might happen 0  Total GAD 7 Score 9      Upstream - 01/29/21 1143       Pregnancy Intention Screening   Does the patient want to become pregnant in the next year? Yes    Does the patient's partner want to become pregnant in the next year? Yes    Would the patient like to discuss contraceptive options today? No      Contraception Wrap Up   Current Method Pregnant/Seeking Pregnancy    End Method Pregnant/Seeking Pregnancy    Contraception Counseling Provided No            Examination chaperoned by Tish RN  Impression and Plan: 1. Encounter for gynecological examination with Papanicolaou smear of cervix Pap sent Pap in  3 years if normal Physical  with PCP next year Labs with PCP Mammogram in October   2. Encounter for screening fecal occult blood testing   3. Superficial fungus infection of skin Will rx nystartin powders Meds ordered this encounter  Medications   nystatin (MYCOSTATIN/NYSTOP) powder    Sig: Apply 1 application topically 3 (three) times daily.    Dispense:  60 g    Refill:  12    Order Specific Question:   Supervising Provider    Answer:   Tania Ade H [[id number]]   Prenatal Vit-Fe Fumarate-FA (PNV PRENATAL PLUS MULTIVITAMIN) 27-1 MG TABS    Sig: Take 1 daily    Dispense:  30 tablet    Refill:  12     Order Specific Question:   Supervising Provider    Answer:   Elonda Husky, LUTHER H [[id number]]     4. Patient desires pregnancy Will rx PNV Given number to [facility]

## 2021-02-02 LAB — CYTOLOGY - PAP
Chlamydia: NEGATIVE
Comment: NEGATIVE
Comment: NEGATIVE
Comment: NORMAL
Diagnosis: NEGATIVE
Diagnosis: REACTIVE
High risk HPV: NEGATIVE
Neisseria Gonorrhea: NEGATIVE

## 2021-02-12 ENCOUNTER — Other Ambulatory Visit: Payer: Self-pay | Admitting: Orthopedic Surgery

## 2021-02-12 DIAGNOSIS — M545 Low back pain, unspecified: Secondary | ICD-10-CM

## 2021-02-13 ENCOUNTER — Ambulatory Visit
Admission: RE | Admit: 2021-02-13 | Discharge: 2021-02-13 | Disposition: A | Discharge status: Home / Self Care | Payer: Medicaid Other | Source: Ambulatory Visit | Attending: Orthopedic Surgery | Admitting: Orthopedic Surgery

## 2021-02-13 DIAGNOSIS — M545 Low back pain, unspecified: Secondary | ICD-10-CM

## 2021-02-25 ENCOUNTER — Ambulatory Visit
Admission: RE | Admit: 2021-02-25 | Discharge: 2021-02-25 | Disposition: A | Discharge status: Home / Self Care | Payer: BC Managed Care – PPO | Source: Ambulatory Visit | Attending: Orthopedic Surgery | Admitting: Orthopedic Surgery

## 2021-02-25 ENCOUNTER — Other Ambulatory Visit: Payer: Self-pay

## 2021-02-25 DIAGNOSIS — M545 Low back pain, unspecified: Secondary | ICD-10-CM

## 2021-02-26 ENCOUNTER — Ambulatory Visit: Payer: Medicaid Other

## 2021-02-26 DIAGNOSIS — J4550 Severe persistent asthma, uncomplicated: Secondary | ICD-10-CM | POA: Diagnosis not present

## 2021-03-03 DIAGNOSIS — H538 Other visual disturbances: Secondary | ICD-10-CM | POA: Insufficient documentation

## 2021-03-04 ENCOUNTER — Encounter (INDEPENDENT_AMBULATORY_CARE_PROVIDER_SITE_OTHER): Payer: Self-pay

## 2021-03-04 ENCOUNTER — Other Ambulatory Visit (INDEPENDENT_AMBULATORY_CARE_PROVIDER_SITE_OTHER): Payer: Self-pay

## 2021-03-04 ENCOUNTER — Telehealth (INDEPENDENT_AMBULATORY_CARE_PROVIDER_SITE_OTHER): Payer: Self-pay

## 2021-03-04 DIAGNOSIS — K5909 Other constipation: Secondary | ICD-10-CM

## 2021-03-04 DIAGNOSIS — R1011 Right upper quadrant pain: Secondary | ICD-10-CM

## 2021-03-04 DIAGNOSIS — Z1211 Encounter for screening for malignant neoplasm of colon: Secondary | ICD-10-CM

## 2021-03-04 MED ORDER — PEG 3350-KCL-NA BICARB-NACL 420 G PO SOLR: 4000.0000 mL | ORAL | 0 refills | Status: DC

## 2021-03-04 NOTE — Telephone Encounter (Signed)
LeighAnn Markiah Janeway, CMA  

## 2021-03-10 ENCOUNTER — Encounter (INDEPENDENT_AMBULATORY_CARE_PROVIDER_SITE_OTHER): Payer: Self-pay

## 2021-03-16 ENCOUNTER — Telehealth (INDEPENDENT_AMBULATORY_CARE_PROVIDER_SITE_OTHER): Payer: Self-pay | Admitting: *Deleted

## 2021-03-16 NOTE — Telephone Encounter (Signed)
Last ov 07/23/19. Has colonoscopy coming up on 03/31/21. Pt states she is on trulance 3mg . Not able to take daily because it is too much and states she takes one about every other day to every 2 days. Somedays she takes 2 if needed. States last 2 weeks med is not helping. Last BM 2 days ago. Having some gas. Took two trulance this morning with coffee and no results.  Her abdomen is hard and having some discomfort. She has tried fibercon in the past and no relief and now taking metamucil capsules 3 per day.

## 2021-03-17 ENCOUNTER — Telehealth (INDEPENDENT_AMBULATORY_CARE_PROVIDER_SITE_OTHER): Payer: Self-pay

## 2021-03-17 ENCOUNTER — Encounter (INDEPENDENT_AMBULATORY_CARE_PROVIDER_SITE_OTHER): Payer: Self-pay

## 2021-03-17 NOTE — Telephone Encounter (Signed)
Opened in Error.

## 2021-03-17 NOTE — Telephone Encounter (Signed)
Per dr Laural Golden pt needs to be at home for the day and take a fleet enema and miralax 17grams. May take back to back. Call back if problems. Called and discussed with pt. Pt states she tried the enema and no results will try miralax. She also asked if she could take clenpix cranberry flavor prep instead of what was sent in for her. Pharmacist told her insurance may not pay. We had samples so I checked with Darius Bump to see if she could have a sample and Darius Bump printed new instructions. I told pt sample and instructions are upfront for pickup and she verbalized understanding of all.

## 2021-03-17 NOTE — Telephone Encounter (Signed)
Opened In Error

## 2021-03-26 ENCOUNTER — Encounter (HOSPITAL_COMMUNITY)
Admission: RE | Admit: 2021-03-26 | Discharge: 2021-03-26 | Disposition: A | Discharge status: Home / Self Care | Payer: BC Managed Care – PPO | Source: Ambulatory Visit | Attending: Internal Medicine | Admitting: Internal Medicine

## 2021-03-26 ENCOUNTER — Ambulatory Visit (HOSPITAL_COMMUNITY): Payer: BC Managed Care – PPO | Attending: Physician Assistant | Admitting: Physical Therapy

## 2021-03-26 ENCOUNTER — Encounter (HOSPITAL_COMMUNITY): Payer: Self-pay

## 2021-03-26 ENCOUNTER — Ambulatory Visit (INDEPENDENT_AMBULATORY_CARE_PROVIDER_SITE_OTHER): Payer: BC Managed Care – PPO | Admitting: Adult Health

## 2021-03-26 ENCOUNTER — Other Ambulatory Visit: Payer: Self-pay

## 2021-03-26 ENCOUNTER — Encounter (HOSPITAL_COMMUNITY): Payer: Self-pay | Admitting: Physical Therapy

## 2021-03-26 ENCOUNTER — Encounter: Payer: Self-pay | Admitting: Adult Health

## 2021-03-26 VITALS — BP 115/72 | HR 92 | Ht 66.0 in | Wt 193.5 lb

## 2021-03-26 VITALS — BP 115/70 | HR 93 | Temp 98.5°F | Resp 18 | Ht 66.0 in | Wt 185.0 lb

## 2021-03-26 DIAGNOSIS — M6281 Muscle weakness (generalized): Secondary | ICD-10-CM | POA: Diagnosis present

## 2021-03-26 DIAGNOSIS — M5441 Lumbago with sciatica, right side: Secondary | ICD-10-CM | POA: Diagnosis not present

## 2021-03-26 DIAGNOSIS — M5442 Lumbago with sciatica, left side: Secondary | ICD-10-CM | POA: Diagnosis present

## 2021-03-26 DIAGNOSIS — G8929 Other chronic pain: Secondary | ICD-10-CM

## 2021-03-26 DIAGNOSIS — Z01818 Encounter for other preprocedural examination: Secondary | ICD-10-CM | POA: Insufficient documentation

## 2021-03-26 DIAGNOSIS — Z319 Encounter for procreative management, unspecified: Secondary | ICD-10-CM

## 2021-03-26 HISTORY — DX: Prediabetes: R73.03

## 2021-03-26 LAB — RAPID URINE DRUG SCREEN, HOSP PERFORMED
Amphetamines: NOT DETECTED
Barbiturates: NOT DETECTED
Benzodiazepines: NOT DETECTED
Cocaine: NOT DETECTED
Opiates: NOT DETECTED
Tetrahydrocannabinol: NOT DETECTED

## 2021-03-26 LAB — PREGNANCY, URINE: Preg Test, Ur: NEGATIVE

## 2021-03-26 NOTE — Patient Instructions (Signed)

## 2021-03-26 NOTE — Progress Notes (Signed)
Please excuse Sharon Vazquez from work on 03/30/2021-03/31/2021.  She has a procedure at [facility] that requires her to be out of work for both of these days.

## 2021-03-26 NOTE — Progress Notes (Signed)
  Subjective:     Patient ID: Sharon Vazquez, female   DOB: 09-07-1980, 40 y.o.   MRN: [id number]  HPI Cherrell is a 40 year old black female, married, G0P0, wants to get pregnant, took clomid in 2020 and did ovulate, in [place]. She has never seen fertility specialist. PCP is Dr Posey Pronto. Lab Results  Component Value Date   DIAGPAP  01/29/2021    - Negative for Intraepithelial Lesions or Malignancy (NILM)   DIAGPAP - Benign reactive/reparative changes 01/29/2021   HPVHIGH Negative 01/29/2021    Review of Systems Periods regular Wants to be pregnant Reviewed past medical,surgical, social and family history. Reviewed medications and allergies.     Objective:   Physical Exam BP 115/72 (BP Location: Left Arm, Patient Position: Sitting, Cuff Size: Normal)   Pulse 92   Ht 5\' 6"  (1.676 m)   Wt 193 lb 8 oz (87.8 kg)   LMP 03/04/2021   BMI 31.23 kg/m     Talk only: She has regular periods, wants to be pregnant, will get her to call with next period and will check progesterone level.  Fall risk is high  Upstream - 03/26/21 1301       Pregnancy Intention Screening   Does the patient want to become pregnant in the next year? Yes    Does the patient's partner want to become pregnant in the next year? Yes    Would the patient like to discuss contraceptive options today? No      Contraception Wrap Up   Current Method Pregnant/Seeking Pregnancy    End Method Pregnant/Seeking Pregnancy    Contraception Counseling Provided No             Assessment:     1. Patient desires pregnancy Call with next period, will check progesterone level day 21 Have sex every other day, days 7-24    Plan:     May need semen analysis too  Follow up  prn
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
## 2021-03-26 NOTE — Patient Instructions (Signed)
   Your procedure is scheduled on: 03/31/2021  Report to [facility] at     7:30 AM.  Call this number if you have problems the morning of surgery: [phone]   Remember:              Follow Directions on the letter you received from Your Physician's office regarding the Bowel Prep              No Smoking the day of Procedure :   Take these medicines the morning of surgery with A SIP OF WATER: xanax, Flexeril, Esomeprazole, lamotrigin, xyzal, metoprolol and oxycodone   No diabetic medication am of procedure  Use inhalers if needed   Do not wear jewelry, make-up or nail polish.    Do not bring valuables to the hospital.  Contacts, dentures or bridgework may not be worn into surgery.  .   Patients discharged the day of surgery will not be allowed to drive home.     Colonoscopy, Adult, Care After This sheet gives you information about how to care for yourself after your procedure. Your health care provider may also give you more specific instructions. If you have problems or questions, contact your health care provider. What can I expect after the procedure? After the procedure, it is common to have: A small amount of blood in your stool for 24 hours after the procedure. Some gas. Mild abdominal cramping or bloating.  Follow these instructions at home: General instructions  For the first 24 hours after the procedure: Do not drive or use machinery. Do not sign important documents. Do not drink alcohol. Do your regular daily activities at a slower pace than normal. Eat soft, easy-to-digest foods. Rest often. Take over-the-counter or prescription medicines only as told by your health care provider. It is up to you to get the results of your procedure. Ask your health care provider, or the department performing the procedure, when your results will be ready. Relieving cramping and bloating Try walking around when you have cramps or feel bloated. Apply heat to your abdomen as  told by your health care provider. Use a heat source that your health care provider recommends, such as a moist heat pack or a heating pad. Place a towel between your skin and the heat source. Leave the heat on for 20-30 minutes. Remove the heat if your skin turns bright red. This is especially important if you are unable to feel pain, heat, or cold. You may have a greater risk of getting burned. Eating and drinking Drink enough fluid to keep your urine clear or pale yellow. Resume your normal diet as instructed by your health care provider. Avoid heavy or fried foods that are hard to digest. Avoid drinking alcohol for as long as instructed by your health care provider. Contact a health care provider if: You have blood in your stool 2-3 days after the procedure. Get help right away if: You have more than a small spotting of blood in your stool. You pass large blood clots in your stool. Your abdomen is swollen. You have nausea or vomiting. You have a fever. You have increasing abdominal pain that is not relieved with medicine. This information is not intended to replace advice given to you by your health care provider. Make sure you discuss any questions you have with your health care provider. Document Released: 01/05/2004 Document Revised: 02/15/2016 Document Reviewed: 08/04/2015 Elsevier Interactive Patient Education  [employer].

## 2021-03-29 ENCOUNTER — Encounter (HOSPITAL_COMMUNITY): Payer: BC Managed Care – PPO

## 2021-03-31 ENCOUNTER — Ambulatory Visit (HOSPITAL_COMMUNITY): Payer: BC Managed Care – PPO | Admitting: Anesthesiology

## 2021-03-31 ENCOUNTER — Encounter (HOSPITAL_COMMUNITY)
Admission: RE | Disposition: A | Discharge status: Home / Self Care | Payer: Self-pay | Source: Home / Self Care | Attending: Internal Medicine

## 2021-03-31 ENCOUNTER — Ambulatory Visit (HOSPITAL_COMMUNITY)
Admission: RE | Admit: 2021-03-31 | Discharge: 2021-03-31 | Disposition: A | Discharge status: Home / Self Care | Payer: BC Managed Care – PPO | Attending: Internal Medicine | Admitting: Internal Medicine

## 2021-03-31 DIAGNOSIS — Z833 Family history of diabetes mellitus: Secondary | ICD-10-CM | POA: Insufficient documentation

## 2021-03-31 DIAGNOSIS — K5909 Other constipation: Secondary | ICD-10-CM | POA: Diagnosis not present

## 2021-03-31 DIAGNOSIS — F17210 Nicotine dependence, cigarettes, uncomplicated: Secondary | ICD-10-CM | POA: Diagnosis not present

## 2021-03-31 DIAGNOSIS — D120 Benign neoplasm of cecum: Secondary | ICD-10-CM | POA: Diagnosis not present

## 2021-03-31 DIAGNOSIS — Z7985 Long-term (current) use of injectable non-insulin antidiabetic drugs: Secondary | ICD-10-CM | POA: Diagnosis not present

## 2021-03-31 DIAGNOSIS — Z8379 Family history of other diseases of the digestive system: Secondary | ICD-10-CM | POA: Diagnosis not present

## 2021-03-31 DIAGNOSIS — K644 Residual hemorrhoidal skin tags: Secondary | ICD-10-CM | POA: Diagnosis not present

## 2021-03-31 DIAGNOSIS — G8929 Other chronic pain: Secondary | ICD-10-CM | POA: Insufficient documentation

## 2021-03-31 DIAGNOSIS — R1011 Right upper quadrant pain: Secondary | ICD-10-CM

## 2021-03-31 DIAGNOSIS — Z1211 Encounter for screening for malignant neoplasm of colon: Secondary | ICD-10-CM | POA: Diagnosis present

## 2021-03-31 DIAGNOSIS — R7303 Prediabetes: Secondary | ICD-10-CM | POA: Insufficient documentation

## 2021-03-31 DIAGNOSIS — Z09 Encounter for follow-up examination after completed treatment for conditions other than malignant neoplasm: Secondary | ICD-10-CM | POA: Diagnosis not present

## 2021-03-31 DIAGNOSIS — Z86010 Personal history of colonic polyps: Secondary | ICD-10-CM | POA: Diagnosis not present

## 2021-03-31 HISTORY — PX: POLYPECTOMY: SHX5525

## 2021-03-31 HISTORY — PX: COLONOSCOPY WITH PROPOFOL: SHX5780

## 2021-03-31 LAB — HM COLONOSCOPY

## 2021-03-31 SURGERY — COLONOSCOPY WITH PROPOFOL
Anesthesia: General

## 2021-03-31 MED ORDER — LIDOCAINE HCL (PF) 2 % IJ SOLN
INTRAMUSCULAR | Status: AC
  Filled 2021-03-31: qty 5

## 2021-03-31 MED ORDER — LIDOCAINE HCL (CARDIAC) PF 100 MG/5ML IV SOSY
PREFILLED_SYRINGE | INTRAVENOUS | Status: DC | PRN
  Administered 2021-03-31: 40 mg via INTRAVENOUS

## 2021-03-31 MED ORDER — LACTATED RINGERS IV SOLN: INTRAVENOUS | Status: DC

## 2021-03-31 MED ORDER — PROPOFOL 1000 MG/100ML IV EMUL
INTRAVENOUS | Status: AC
  Filled 2021-03-31: qty 100

## 2021-03-31 MED ORDER — PROPOFOL 500 MG/50ML IV EMUL
INTRAVENOUS | Status: DC | PRN
  Administered 2021-03-31: 200 ug/kg/min via INTRAVENOUS

## 2021-03-31 MED ORDER — PROPOFOL 10 MG/ML IV BOLUS
INTRAVENOUS | Status: DC | PRN
  Administered 2021-03-31: 100 mg via INTRAVENOUS

## 2021-03-31 NOTE — Anesthesia Preprocedure Evaluation (Signed)
Anesthesia Evaluation  Patient identified by MRN, date of birth, ID band Patient awake    Reviewed: Allergy & Precautions, NPO status , Patient's Chart, lab work & pertinent test results  History of Anesthesia Complications Negative for: history of anesthetic complications  Airway Mallampati: I  TM Distance: >3 FB Neck ROM: Full    Dental  (+) Dental Advisory Given, Partial Upper, Partial Lower   Pulmonary shortness of breath and with exertion, asthma , Current SmokerPatient did not abstain from smoking.,           Cardiovascular hypertension, Pt. on medications Normal cardiovascular exam Rhythm:Regular Rate:Normal     Neuro/Psych  Headaches, PSYCHIATRIC DISORDERS Anxiety Depression Bipolar Disorder  Neuromuscular disease    GI/Hepatic Neg liver ROS, GERD  Medicated and Controlled,  Endo/Other  negative endocrine ROS  Renal/GU negative Renal ROS     Musculoskeletal  (+) Arthritis , Osteoarthritis,    Abdominal   Peds  (+) ADHD Hematology negative hematology ROS (+)   Anesthesia Other Findings Back pain  Reproductive/Obstetrics negative OB ROS                             Anesthesia Physical Anesthesia Plan  ASA: 3  Anesthesia Plan: General   Post-op Pain Management:    Induction: Intravenous  PONV Risk Score and Plan: TIVA  Airway Management Planned: Nasal Cannula and Natural Airway  Additional Equipment:   Intra-op Plan:   Post-operative Plan:   Informed Consent: I have reviewed the patients History and Physical, chart, labs and discussed the procedure including the risks, benefits and alternatives for the proposed anesthesia with the patient or authorized representative who has indicated his/her understanding and acceptance.     Dental advisory given  Plan Discussed with: CRNA and Surgeon  Anesthesia Plan Comments:         Anesthesia Quick Evaluation

## 2021-03-31 NOTE — Transfer of Care (Signed)
Immediate Anesthesia Transfer of Care Note  Patient: Sharon Vazquez  Procedure(s) Performed: COLONOSCOPY WITH PROPOFOL POLYPECTOMY  Patient Location: PACU  Anesthesia Type:General  Level of Consciousness: awake, alert , oriented and patient cooperative  Airway & Oxygen Therapy: Patient Spontanous Breathing  Post-op Assessment: Report given to RN, Post -op Vital signs reviewed and stable and Patient moving all extremities X 4  Post vital signs: Reviewed and stable  Last Vitals:  Vitals Value Taken Time  BP 95/61 03/31/21 0950  Temp 36.6 C 03/31/21 0950  Pulse 80 03/31/21 0950  Resp 13 03/31/21 0950  SpO2 100 % 03/31/21 0950    Last Pain:  Vitals:   03/31/21 0950  TempSrc: Oral  PainSc: 0-No pain      Patients Stated Pain Goal: 5 (09/73/53 2992)  Complications: No notable events documented.

## 2021-03-31 NOTE — Discharge Instructions (Signed)
No aspirin or NSAIDs for 5 days. Resume other medications and diet as before No driving for 24 hours Physician will call with biopsy results Next colonoscopy in 5 years.

## 2021-03-31 NOTE — Anesthesia Postprocedure Evaluation (Signed)
Anesthesia Post Note  Patient: Sharon Vazquez  Procedure(s) Performed: COLONOSCOPY WITH PROPOFOL POLYPECTOMY  Patient location during evaluation: Phase II Anesthesia Type: General Level of consciousness: awake and alert and oriented Pain management: pain level controlled Vital Signs Assessment: post-procedure vital signs reviewed and stable Respiratory status: spontaneous breathing, nonlabored ventilation and respiratory function stable Cardiovascular status: blood pressure returned to baseline and stable Postop Assessment: no apparent nausea or vomiting Anesthetic complications: no   No notable events documented.   Last Vitals:  Vitals:   03/31/21 0818 03/31/21 0950  BP:  95/61  Pulse: 79 80  Resp: (!) 22 13  Temp:  36.6 C  SpO2: 100% 100%    Last Pain:  Vitals:   03/31/21 0950  TempSrc: Oral  PainSc: 0-No pain                 [name] C [name]

## 2021-03-31 NOTE — Op Note (Signed)
[facility] Patient Name: Sharon Vazquez Procedure Date: 03/31/2021 8:52 AM MRN: [id number] Date of Birth: [birth year] Attending MD: Hildred Laser , MD CSN: [id number] Age: 40 Admit Type: Outpatient Procedure:                Colonoscopy Indications:              High risk colon cancer surveillance: Personal                            history of colonic polyps Providers:                Hildred Laser, MD, Lurline Del, RN, Casimer Bilis, Technician Referring MD:             Dianne Dun, MD Medicines:                Propofol per Anesthesia Complications:            No immediate complications. Estimated Blood Loss:     Estimated blood loss was minimal. Procedure:                Pre-Anesthesia Assessment:                           - Prior to the procedure, a History and Physical                            was performed, and patient medications and                            allergies were reviewed. The patient's tolerance of                            previous anesthesia was also reviewed. The risks                            and benefits of the procedure and the sedation                            options and risks were discussed with the patient.                            All questions were answered, and informed consent                            was obtained. Prior Anticoagulants: The patient has                            taken no previous anticoagulant or antiplatelet                            agents except for NSAID medication. ASA Grade  Assessment: III - A patient with severe systemic                            disease. After reviewing the risks and benefits,                            the patient was deemed in satisfactory condition to                            undergo the procedure.                           After obtaining informed consent, the colonoscope                            was passed under direct  vision. Throughout the                            procedure, the patient's blood pressure, pulse, and                            oxygen saturations were monitored continuously. The                            [id number]-HQ190L ([id number]) scope was introduced through                            the anus and advanced to the the cecum, identified                            by appendiceal orifice and ileocecal valve. The                            colonoscopy was performed without difficulty. The                            patient tolerated the procedure well. The quality                            of the bowel preparation was excellent. The                            ileocecal valve, appendiceal orifice, and rectum                            were photographed. Scope In: 9:15:36 AM Scope Out: 9:40:51 AM Scope Withdrawal Time: 0 hours 21 minutes 11 seconds  Total Procedure Duration: 0 hours 25 minutes 15 seconds  Findings:      Skin tags were found on perianal exam.      A 6 to 10 mm polyp was found in the cecum. The polyp was flat. The polyp       was removed with a piecemeal technique using a cold snare. Resection was       complete, and retrieval was complete. Coagulation  for margind coagulated       using argon plasma was successful. The pathology specimen was placed       into Bottle Number 1.      External hemorrhoids were found during retroflexion. The hemorrhoids       were small. Impression:               - Perianal skin tags found on perianal exam.                           - One 6 to 10 mm polyp in the cecum, removed                            piecemeal using a cold snare. Resected and                            retrieved. Treated with argon plasma coagulation                            (APC).                           - External hemorrhoids. Moderate Sedation:      Per Anesthesia Care Recommendation:           - Patient has a contact number available for                             emergencies. The signs and symptoms of potential                            delayed complications were discussed with the                            patient. Return to normal activities tomorrow.                            Written discharge instructions were provided to the                            patient.                           - Resume previous diet today.                           - Continue present medications.                           - No aspirin, ibuprofen, naproxen, or other                            non-steroidal anti-inflammatory drugs for 5 days.                           - Await pathology results.                           -  Repeat colonoscopy in 5 years for surveillance. Procedure Code(s):        --- Professional ---                           [phone], Colonoscopy, flexible; with removal of                            tumor(s), polyp(s), or other lesion(s) by snare                            technique Diagnosis Code(s):        --- Professional ---                           K64.4, Residual hemorrhoidal skin tags                           Z86.010, Personal history of colonic polyps                           K63.5, Polyp of colon CPT copyright 2019 American Medical Association. All rights reserved. The codes documented in this report are preliminary and upon coder review may  be revised to meet current compliance requirements. Hildred Laser, MD Hildred Laser, MD 03/31/2021 9:55:09 AM This report has been signed electronically. Number of Addenda: 0
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
## 2021-04-01 ENCOUNTER — Encounter (HOSPITAL_COMMUNITY): Payer: Self-pay | Admitting: Physical Therapy

## 2021-04-01 ENCOUNTER — Encounter (INDEPENDENT_AMBULATORY_CARE_PROVIDER_SITE_OTHER): Payer: Self-pay

## 2021-04-01 ENCOUNTER — Telehealth (INDEPENDENT_AMBULATORY_CARE_PROVIDER_SITE_OTHER): Payer: Self-pay | Admitting: *Deleted

## 2021-04-01 ENCOUNTER — Ambulatory Visit (HOSPITAL_COMMUNITY): Payer: BC Managed Care – PPO | Admitting: Physical Therapy

## 2021-04-01 ENCOUNTER — Other Ambulatory Visit: Payer: Self-pay

## 2021-04-01 DIAGNOSIS — M5442 Lumbago with sciatica, left side: Secondary | ICD-10-CM

## 2021-04-01 DIAGNOSIS — M5441 Lumbago with sciatica, right side: Secondary | ICD-10-CM | POA: Diagnosis not present

## 2021-04-01 DIAGNOSIS — M6281 Muscle weakness (generalized): Secondary | ICD-10-CM

## 2021-04-01 DIAGNOSIS — G8929 Other chronic pain: Secondary | ICD-10-CM

## 2021-04-01 LAB — SURGICAL PATHOLOGY
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
## 2021-04-01 NOTE — Telephone Encounter (Signed)
Pt wanted results of colonoscopy. Saw results were available on mychart.

## 2021-04-01 NOTE — Patient Instructions (Addendum)

## 2021-04-01 NOTE — Telephone Encounter (Signed)
Dr. Laural Golden spoke with pt today about results

## 2021-04-02 ENCOUNTER — Encounter (HOSPITAL_COMMUNITY): Payer: Self-pay | Admitting: Internal Medicine

## 2021-04-03 ENCOUNTER — Other Ambulatory Visit: Payer: Self-pay | Admitting: Family Medicine

## 2021-04-05 ENCOUNTER — Encounter (INDEPENDENT_AMBULATORY_CARE_PROVIDER_SITE_OTHER): Payer: Self-pay | Admitting: *Deleted

## 2021-04-06 ENCOUNTER — Other Ambulatory Visit: Payer: Self-pay | Admitting: Adult Health

## 2021-04-06 ENCOUNTER — Telehealth: Payer: Self-pay | Admitting: Adult Health

## 2021-04-06 ENCOUNTER — Other Ambulatory Visit: Payer: BC Managed Care – PPO

## 2021-04-06 DIAGNOSIS — N926 Irregular menstruation, unspecified: Secondary | ICD-10-CM

## 2021-04-06 NOTE — Telephone Encounter (Signed)
Patient called stating that she does want the HCG test I will put her on the labs schedule for today if you will order the lab and msg her back that it is ordered. Thank you

## 2021-04-06 NOTE — Progress Notes (Signed)
Ck QHCG today

## 2021-04-07 LAB — BETA HCG QUANT (REF LAB): hCG Quant: <1 m[IU]/mL

## 2021-04-09 ENCOUNTER — Encounter (HOSPITAL_COMMUNITY): Payer: BC Managed Care – PPO | Admitting: Physical Therapy

## 2021-04-13 ENCOUNTER — Encounter (HOSPITAL_COMMUNITY): Payer: Self-pay

## 2021-04-13 ENCOUNTER — Ambulatory Visit (HOSPITAL_COMMUNITY): Payer: BC Managed Care – PPO | Attending: Physician Assistant

## 2021-04-13 ENCOUNTER — Other Ambulatory Visit: Payer: Self-pay

## 2021-04-13 DIAGNOSIS — M5441 Lumbago with sciatica, right side: Secondary | ICD-10-CM | POA: Insufficient documentation

## 2021-04-13 DIAGNOSIS — M5442 Lumbago with sciatica, left side: Secondary | ICD-10-CM | POA: Diagnosis present

## 2021-04-13 DIAGNOSIS — M6281 Muscle weakness (generalized): Secondary | ICD-10-CM | POA: Insufficient documentation

## 2021-04-13 DIAGNOSIS — G8929 Other chronic pain: Secondary | ICD-10-CM | POA: Diagnosis present
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
## 2021-04-22 ENCOUNTER — Other Ambulatory Visit: Payer: Self-pay

## 2021-04-22 ENCOUNTER — Encounter (HOSPITAL_COMMUNITY): Payer: Self-pay

## 2021-04-22 ENCOUNTER — Ambulatory Visit (HOSPITAL_COMMUNITY): Payer: BC Managed Care – PPO

## 2021-04-22 DIAGNOSIS — M6281 Muscle weakness (generalized): Secondary | ICD-10-CM

## 2021-04-22 DIAGNOSIS — M5441 Lumbago with sciatica, right side: Secondary | ICD-10-CM | POA: Diagnosis not present

## 2021-04-22 DIAGNOSIS — G8929 Other chronic pain: Secondary | ICD-10-CM
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
## 2021-04-23 ENCOUNTER — Ambulatory Visit (HOSPITAL_COMMUNITY): Payer: BC Managed Care – PPO | Admitting: Physical Therapy

## 2021-04-23 ENCOUNTER — Encounter (HOSPITAL_COMMUNITY): Payer: Self-pay | Admitting: Physical Therapy

## 2021-04-23 ENCOUNTER — Ambulatory Visit: Payer: BC Managed Care – PPO

## 2021-04-23 DIAGNOSIS — J4550 Severe persistent asthma, uncomplicated: Secondary | ICD-10-CM

## 2021-04-23 DIAGNOSIS — M5441 Lumbago with sciatica, right side: Secondary | ICD-10-CM | POA: Diagnosis not present

## 2021-04-23 DIAGNOSIS — M6281 Muscle weakness (generalized): Secondary | ICD-10-CM

## 2021-04-23 DIAGNOSIS — G8929 Other chronic pain: Secondary | ICD-10-CM
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
## 2021-05-03 ENCOUNTER — Ambulatory Visit (HOSPITAL_COMMUNITY): Payer: BC Managed Care – PPO | Admitting: Physical Therapy

## 2021-05-03 ENCOUNTER — Encounter (HOSPITAL_COMMUNITY): Payer: Self-pay | Admitting: Physical Therapy

## 2021-05-03 ENCOUNTER — Other Ambulatory Visit: Payer: Self-pay

## 2021-05-03 DIAGNOSIS — G8929 Other chronic pain: Secondary | ICD-10-CM

## 2021-05-03 DIAGNOSIS — M5441 Lumbago with sciatica, right side: Secondary | ICD-10-CM | POA: Diagnosis not present

## 2021-05-03 DIAGNOSIS — M6281 Muscle weakness (generalized): Secondary | ICD-10-CM

## 2021-05-04 ENCOUNTER — Other Ambulatory Visit: Payer: Self-pay | Admitting: Family Medicine

## 2021-05-04 NOTE — Progress Notes (Signed)
   05/03/21 0001  Lumbar Exercises: Supine  Ab Set 15 reps  Bent Knee Raise 15 reps  Other Supine Lumbar Exercises bent knee fall outs alternating x20  Other Supine Lumbar Exercises supine clamshell 15 x 5" G TB  Manual Therapy  Manual Therapy Soft tissue mobilization  Manual therapy comments completed separate from rest of treatment  Soft tissue mobilization STM to bilateral lumbar paraspinals pre and post dry needling with trigger point identifiaction and surface area prep
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
## 2021-05-05 ENCOUNTER — Ambulatory Visit (HOSPITAL_COMMUNITY): Payer: BC Managed Care – PPO | Admitting: Physical Therapy

## 2021-05-05 ENCOUNTER — Other Ambulatory Visit: Payer: Self-pay

## 2021-05-05 ENCOUNTER — Encounter (HOSPITAL_COMMUNITY): Payer: Self-pay | Admitting: Physical Therapy

## 2021-05-05 DIAGNOSIS — M5441 Lumbago with sciatica, right side: Secondary | ICD-10-CM | POA: Diagnosis not present

## 2021-05-05 DIAGNOSIS — G8929 Other chronic pain: Secondary | ICD-10-CM

## 2021-05-05 DIAGNOSIS — M6281 Muscle weakness (generalized): Secondary | ICD-10-CM

## 2021-05-05 NOTE — Patient Instructions (Signed)
Access Code: [email or web address] URL: [email or web address] Date: 05/05/2021 Prepared by: Josue Hector  Exercises Beginner Front Arm Support - 2 x daily - 7 x weekly - 2 sets - 10 reps
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
## 2021-05-14 ENCOUNTER — Ambulatory Visit
Admission: EM | Admit: 2021-05-14 | Discharge: 2021-05-14 | Disposition: A | Discharge status: Home / Self Care | Payer: BC Managed Care – PPO | Attending: Family Medicine | Admitting: Family Medicine

## 2021-05-14 ENCOUNTER — Encounter: Payer: Self-pay | Admitting: Emergency Medicine

## 2021-05-14 ENCOUNTER — Other Ambulatory Visit: Payer: Self-pay

## 2021-05-14 DIAGNOSIS — J4551 Severe persistent asthma with (acute) exacerbation: Secondary | ICD-10-CM | POA: Diagnosis not present

## 2021-05-14 DIAGNOSIS — J069 Acute upper respiratory infection, unspecified: Secondary | ICD-10-CM | POA: Diagnosis not present

## 2021-05-14 MED ORDER — OSELTAMIVIR PHOSPHATE 75 MG PO CAPS: 75.0000 mg | ORAL_CAPSULE | Freq: Two times a day (BID) | ORAL | 0 refills | Status: DC

## 2021-05-14 MED ORDER — DEXAMETHASONE SODIUM PHOSPHATE 10 MG/ML IJ SOLN
10.0000 mg | Freq: Once | INTRAMUSCULAR | Status: AC
  Administered 2021-05-14: 10 mg via INTRAMUSCULAR

## 2021-05-14 MED ORDER — PROMETHAZINE-DM 6.25-15 MG/5ML PO SYRP: 5.0000 mL | ORAL_SOLUTION | Freq: Four times a day (QID) | ORAL | 0 refills | Status: DC | PRN

## 2021-05-14 MED ORDER — PREDNISONE 20 MG PO TABS: 40.0000 mg | ORAL_TABLET | Freq: Every day | ORAL | 0 refills | Status: DC

## 2021-05-14 MED ORDER — ALBUTEROL SULFATE HFA 108 (90 BASE) MCG/ACT IN AERS: INHALATION_SPRAY | RESPIRATORY_TRACT | 0 refills | Status: DC

## 2021-05-14 NOTE — ED Triage Notes (Addendum)
Patient c/o nasal congestion and productive cough  x 2 days.   Patient endorses having "cold sweats". Patient endorses wheezing.   Patient endorses chest pain when coughing.   Patient has taken Tessalon Pearls and Mucinex with no relief of symptoms. Patient has taken Keflex with no relief of symptoms.  Patient history of Asthma and Tobacco Use.
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
## 2021-05-15 LAB — COVID-19, FLU A+B NAA
Influenza A, NAA: NOT DETECTED
Influenza B, NAA: NOT DETECTED
SARS-CoV-2, NAA: NOT DETECTED

## 2021-05-18 NOTE — Telephone Encounter (Signed)
Called and spoke to patient to see if she was still in need of the Breztri samples. Patient sounded confused on the phone as to what the inhaler was and the need for it. I informed patient of the purpose and she expressed that she currently has a sinus infection and would use the Breztri if it would help. I informed her that I would ask Dr. Nelva Bush about it and see if she still wanted patient to take the [facility] as she isn't on any daily inhalers and only uses albuterol as needed. Patient made an appointment with Sharon Vazquez for Friday for her sinus infections and stated that she would use the breztri sample if Dr. Nelva Bush wanted her to be on it.  Dr. Nelva Bush please advise if Sharon Vazquez is still needed in patients treatment plan.  Thank you!

## 2021-05-19 ENCOUNTER — Encounter (HOSPITAL_COMMUNITY): Payer: BC Managed Care – PPO | Admitting: Physical Therapy

## 2021-05-20 NOTE — Patient Instructions (Addendum)
Asthma Get stat PA and lateral chest x-ray.  We will call you with results once they are back Start prednisone as below Start Symbicort 160/4.5 mcg using 2 puffs twice a day with spacer to help prevent cough and wheeze.  Spacer given Continue montelukast 10 mg once a day to prevent cough and wheeze.  We will put in a refill for this medication Continue albuterol 2 puffs every 4 hours as needed for cough or wheeze OR Instead use albuterol 0.083% solution via nebulizer one unit vial every 4 hours as needed for cough or wheeze Continue receiving Fasenra injections once every 8 weeks and have access to an epinephrine injector set  Acute Sinus infection Start Augmentin 875 mg taking 1 tablet twice a day for seven days Start prednisone 10 mg taking 2 tablets twice a day for 3 days, then on the fourth day take 2 tablets in the morning, on the fifth day take 1 tablet and stop Start saline nasal rinses several times a day   Allergic rhinitis Continue Xyzal 5 mg once a day as needed for a runny nose.Remember to rotate to a different antihistamine about every 3 months. Some examples of over the counter antihistamines include Zyrtec (cetirizine), Xyzal (levocetirizine), Allegra (fexofenadine), and Claritin (loratidine).  Continue montelukast Continue Atrovent 2 sprays in each nostril twice a day as needed for runny nose Continue Flonase 1-2 sprays in each nostril once a day as needed for a stuffy nose  Allergic conjunctivitis Stop azelastine eyedrops due to your diagnosis of dry eyes Continue refresh eyedrops as recommended by your eye doctor  Reflux Continue dietary and lifestyle modifications as listed below Continue esomeprazole 40 mg once every other day as previously prescribed by your gastroenterologist  Stinging insect allergy Continue to avoid stinging insects.  In case of an allergic reaction, take Benadryl 50 mg every 4 hours, and if life-threatening symptoms occur, inject with EpiPen 0.3  mg.  Call the clinic if this treatment plan is not working well for you  Follow up in 4 weeks weeks or sooner if needed.

## 2021-05-20 NOTE — Telephone Encounter (Signed)
Will do. Thank you

## 2021-05-21 ENCOUNTER — Ambulatory Visit: Payer: BC Managed Care – PPO | Admitting: Family

## 2021-05-21 ENCOUNTER — Ambulatory Visit (HOSPITAL_COMMUNITY)
Admission: RE | Admit: 2021-05-21 | Discharge: 2021-05-21 | Disposition: A | Discharge status: Home / Self Care | Payer: BC Managed Care – PPO | Source: Ambulatory Visit | Attending: Family | Admitting: Family

## 2021-05-21 ENCOUNTER — Encounter: Payer: Self-pay | Admitting: Family

## 2021-05-21 ENCOUNTER — Other Ambulatory Visit: Payer: Self-pay

## 2021-05-21 ENCOUNTER — Ambulatory Visit (HOSPITAL_COMMUNITY): Payer: 59 | Admitting: Physical Therapy

## 2021-05-21 VITALS — BP 132/86 | HR 104 | Temp 98.3°F | Resp 20 | Ht 66.0 in | Wt 192.8 lb

## 2021-05-21 DIAGNOSIS — J019 Acute sinusitis, unspecified: Secondary | ICD-10-CM | POA: Diagnosis not present

## 2021-05-21 DIAGNOSIS — R051 Acute cough: Secondary | ICD-10-CM | POA: Diagnosis not present

## 2021-05-21 DIAGNOSIS — T6391XD Toxic effect of contact with unspecified venomous animal, accidental (unintentional), subsequent encounter: Secondary | ICD-10-CM

## 2021-05-21 DIAGNOSIS — J3089 Other allergic rhinitis: Secondary | ICD-10-CM

## 2021-05-21 DIAGNOSIS — R9389 Abnormal findings on diagnostic imaging of other specified body structures: Secondary | ICD-10-CM

## 2021-05-21 DIAGNOSIS — H1013 Acute atopic conjunctivitis, bilateral: Secondary | ICD-10-CM

## 2021-05-21 DIAGNOSIS — J4541 Moderate persistent asthma with (acute) exacerbation: Secondary | ICD-10-CM

## 2021-05-21 DIAGNOSIS — K219 Gastro-esophageal reflux disease without esophagitis: Secondary | ICD-10-CM

## 2021-05-21 MED ORDER — ALBUTEROL SULFATE (2.5 MG/3ML) 0.083% IN NEBU: 2.5000 mg | INHALATION_SOLUTION | RESPIRATORY_TRACT | 1 refills | Status: DC | PRN

## 2021-05-21 MED ORDER — BUDESONIDE-FORMOTEROL FUMARATE 160-4.5 MCG/ACT IN AERO: 2.0000 | INHALATION_SPRAY | Freq: Two times a day (BID) | RESPIRATORY_TRACT | 5 refills | Status: DC

## 2021-05-21 MED ORDER — MONTELUKAST SODIUM 10 MG PO TABS: 10.0000 mg | ORAL_TABLET | Freq: Every day | ORAL | 5 refills | Status: DC

## 2021-05-21 MED ORDER — EPINEPHRINE 0.3 MG/0.3ML IJ SOAJ: 0.3000 mg | INTRAMUSCULAR | 0 refills | Status: DC | PRN

## 2021-05-21 MED ORDER — AMOXICILLIN-POT CLAVULANATE 875-125 MG PO TABS: 1.0000 | ORAL_TABLET | Freq: Two times a day (BID) | ORAL | 0 refills | Status: DC

## 2021-05-21 NOTE — Progress Notes (Addendum)
[address], [address] [place] [address] Dept: [phone]  FOLLOW UP NOTE  Patient ID: Sharon Vazquez, female    DOB: September 01, 1980  Age: 40 y.o. MRN: [id number] Date of Office Visit: 05/21/2021  Assessment  Chief Complaint: Cough (Patient states she's had a cough for about a week, she went to her PCP last week and she received a prednisone injection, prednisone pills, promethazine and an albuterol inhaler. States she just cant get rid of the cough. Negative covid and flu (tested Friday a week ago). States has had runny nose, thick yellow mucus. Has stopped smoking cigarettes since last Sunday. )  HPI Sharon Vazquez is a 40 year old female who presents today for an acute visit.  She was last seen on September 29, 2020 by Gareth Morgan, FNP for moderate persistent asthma without complication, nonseasonal allergic rhinitis, allergic conjunctivitis, toxic effect of venom, acute nonrecurrent maxillary sinusitis, and gastroesophageal reflux disease.  Her last office visit.  She reports that she has been diagnosed with dry eyes.  Moderate persistent asthma is reported as not well controlled with Fasenra injections every 8 weeks and albuterol as needed.  She reports that she has been out of Singulair 10 mg for the past week.  She has not been using Symbicort 160/4.5 mcg because her breathing has been well up until this point.  She reports that last Wednesday she started having a productive cough and had a leftover antibiotic that she took on Wednesday ,Thursday and possibly Friday morning.  She did not get feeling better so she went to urgent care where she reports that she was giving a steroid injection, prednisone, Tamiflu that she finished yesterday, promethazine-DM, and Ventolin.  She continues to have a productive cough with yellow sputum that has been that color the whole time, wheezing, tightness in her chest, shortness of breath, and nocturnal awakenings due to breathing problems.  She has  been using her albuterol inhaler approximately 3-4 times a day.  She denies any fever, chills, body aches, or sick contacts.  She has not had a chest x-ray.  She reports that the steroid injection did make her feel like herself but she cannot shake this cough.  Besides this trip to the urgent care she has not made any other trips to the emergency room or urgent care due to breathing problems.  She has had 1 other steroid injection for her back around November 17.  Allergic rhinitis is reported as not well controlled with Xyzal 5 mg as needed, Atrovent nasal spray as needed, and Flonase nasal spray as needed.  She has been out of Singulair 10 mg for the past week.  She is not currently using any sinus rinse.  She reports yellow/clear rhinorrhea and denies nasal congestion, and postnasal drip.  She has not had any sinus infections since we last saw her.  Reflux is reported as controlled with esomeprazole 40 mg every other day as prescribed by her gastroenterologist.  She denies any heartburn or reflux symptoms.  She continues to avoid stinging insects and has not had any insect stings since her last office visit.  She requests an in refill.   Drug Allergies:  Allergies  Allergen Reactions   Bee Venom Anaphylaxis   Dilaudid [Hydromorphone Hcl] Anaphylaxis and Hives    Has tolerated morphine since this reaction with Benadryl   Peanut Oil Anaphylaxis   Senna Anaphylaxis and Hives   Adhesive [Tape] Hives and Other (See Comments)    Pulls skin off (use paper tape)  Latex Hives    Review of Systems: Review of Systems  Constitutional:  Negative for chills and fever.       Reports that she was sweating a lot.  Denies fever or chills  HENT:         Reports rhinorrhea that is yellow with clear and denies nasal congestion and postnasal drip  Eyes:        Reports watery eyes and new diagnosis of dry eyes.  Denies itchy eyes  Respiratory:  Positive for cough, shortness of breath and wheezing.         Reports productive cough with yellow sputum.  Also reports wheezing, tightness in her chest, shortness of breath, and nocturnal awakenings due to breathing problems.  Cardiovascular:  Positive for chest pain. Negative for palpitations.       Reports chest pain from an anxiety attack on Wednesday  Gastrointestinal:        Denies heartburn or reflux symptoms with his omeprazole every other day as directed by her gastroenterologist  Skin:  Negative for itching and rash.  Neurological:  Positive for headaches.       Reports history of migraines and is currently on Imitrex  Endo/Heme/Allergies:  Positive for environmental allergies.    Physical Exam: BP 132/86 (BP Location: Left Arm, Patient Position: Sitting, Cuff Size: Normal)    Pulse (!) 104    Temp 98.3 F (36.8 C) (Temporal)    Resp 20    Ht 5\' 6"  (1.676 m)    Wt 192 lb 12.8 oz (87.5 kg)    LMP 05/06/2021 (Approximate)    SpO2 99%    BMI 31.12 kg/m    Physical Exam Constitutional:      Appearance: Normal appearance.  HENT:     Head: Normocephalic and atraumatic.     Comments: Pharynx normal, eyes normal, ears normal, nose: Bilateral lower turbinates moderately edematous and slightly erythematous with no drainage noted    Right Ear: Tympanic membrane, ear canal and external ear normal.     Left Ear: Tympanic membrane, ear canal and external ear normal.     Mouth/Throat:     Mouth: Mucous membranes are moist.     Pharynx: Oropharynx is clear.  Eyes:     Conjunctiva/sclera: Conjunctivae normal.  Cardiovascular:     Rate and Rhythm: Regular rhythm.     Heart sounds: Normal heart sounds.  Pulmonary:     Effort: Pulmonary effort is normal.     Breath sounds: Normal breath sounds.     Comments: Lungs clear to auscultation Musculoskeletal:     Cervical back: Neck supple.  Skin:    General: Skin is warm.  Neurological:     Mental Status: She is alert and oriented to person, place, and time.  Psychiatric:        Mood and Affect:  Mood normal.        Behavior: Behavior normal.        Thought Content: Thought content normal.        Judgment: Judgment normal.    Diagnostics: FVC 3.23 L, FEV1 2.30 L.  Predicted FVC 3.37 L, predicted FEV1 2.77 L.  Spirometry indicates possible mild obstruction.  Postbronchodilator response shows FVC 3.27 L, FEV1 2.11 L.  There is no change in FEV1.  Spirometry indicates possible mild obstruction.  Assessment and Plan: 1. Moderate persistent asthma with (acute) exacerbation   2. Acute non-recurrent sinusitis, unspecified location   3. Non-seasonal allergic rhinitis due to other allergic trigger  4. Allergic conjunctivitis of both eyes   5. Toxic effect of venom, accidental or unintentional, subsequent encounter   6. Gastroesophageal reflux disease, unspecified whether esophagitis present   7. Acute cough     Meds ordered this encounter  Medications   budesonide-formoterol (SYMBICORT) 160-4.5 MCG/ACT inhaler    Sig: Inhale 2 puffs into the lungs in the morning and at bedtime.    Dispense:  1 each    Refill:  5   amoxicillin-clavulanate (AUGMENTIN) 875-125 MG tablet    Sig: Take 1 tablet by mouth 2 (two) times daily.    Dispense:  14 tablet    Refill:  0   montelukast (SINGULAIR) 10 MG tablet    Sig: Take 1 tablet (10 mg total) by mouth at bedtime.    Dispense:  30 tablet    Refill:  5   EPINEPHrine 0.3 mg/0.3 mL IJ SOAJ injection    Sig: Inject 0.3 mg into the muscle as needed for anaphylaxis.    Dispense:  2 each    Refill:  0    Please dispense Mylan or Teva brand generic only.   albuterol (PROVENTIL) (2.5 MG/3ML) 0.083% nebulizer solution    Sig: Take 3 mLs (2.5 mg total) by nebulization every 4 (four) hours as needed for wheezing or shortness of breath.    Dispense:  75 mL    Refill:  1    Patient Instructions  Asthma Get stat PA and lateral chest x-ray.  We will call you with results once they are back Start prednisone as below Start Symbicort 160/4.5 mcg using  2 puffs twice a day with spacer to help prevent cough and wheeze.  Spacer given Continue montelukast 10 mg once a day to prevent cough and wheeze.  We will put in a refill for this medication Continue albuterol 2 puffs every 4 hours as needed for cough or wheeze OR Instead use albuterol 0.083% solution via nebulizer one unit vial every 4 hours as needed for cough or wheeze Continue receiving Fasenra injections once every 8 weeks and have access to an epinephrine injector set  Acute Sinus infection Start Augmentin 875 mg taking 1 tablet twice a day for seven days Start prednisone 10 mg taking 2 tablets twice a day for 3 days, then on the fourth day take 2 tablets in the morning, on the fifth day take 1 tablet and stop Start saline nasal rinses several times a day   Allergic rhinitis Continue Xyzal 5 mg once a day as needed for a runny nose.Remember to rotate to a different antihistamine about every 3 months. Some examples of over the counter antihistamines include Zyrtec (cetirizine), Xyzal (levocetirizine), Allegra (fexofenadine), and Claritin (loratidine).  Continue montelukast Continue Atrovent 2 sprays in each nostril twice a day as needed for runny nose Continue Flonase 1-2 sprays in each nostril once a day as needed for a stuffy nose  Allergic conjunctivitis Stop azelastine eyedrops due to your diagnosis of dry eyes Continue refresh eyedrops as recommended by your eye doctor  Reflux Continue dietary and lifestyle modifications as listed below Continue esomeprazole 40 mg once every other day as previously prescribed by your gastroenterologist  Stinging insect allergy Continue to avoid stinging insects.  In case of an allergic reaction, take Benadryl 50 mg every 4 hours, and if life-threatening symptoms occur, inject with EpiPen 0.3 mg.  Call the clinic if this treatment plan is not working well for you  Follow up in 4 weeks weeks or sooner if  needed.  Return in about 4 weeks  (around 06/18/2021), or if symptoms worsen or fail to improve.    Thank you for the opportunity to care for this patient.  Please do not hesitate to contact me with questions.  Althea Charon, FNP [facility]

## 2021-05-23 ENCOUNTER — Ambulatory Visit (HOSPITAL_COMMUNITY)
Admission: RE | Admit: 2021-05-23 | Discharge: 2021-05-23 | Disposition: A | Discharge status: Home / Self Care | Payer: BC Managed Care – PPO | Source: Ambulatory Visit | Attending: Family | Admitting: Family

## 2021-05-23 ENCOUNTER — Other Ambulatory Visit: Payer: Self-pay

## 2021-05-23 DIAGNOSIS — R051 Acute cough: Secondary | ICD-10-CM | POA: Insufficient documentation

## 2021-05-23 DIAGNOSIS — R9389 Abnormal findings on diagnostic imaging of other specified body structures: Secondary | ICD-10-CM | POA: Insufficient documentation

## 2021-05-23 NOTE — Progress Notes (Signed)
Message left to return call to discuss chest x-ray results.

## 2021-05-23 NOTE — Progress Notes (Signed)
Pt updated on chest x-ray results of no active cardiopulmonary disease.She reports that she continues to cough, but she can breath some better. Instructed her to call us  with an up date on Tuesday and if her symptoms worsen she needs to go to the emergency room. She verbalizes understanding. She is taking the medications as prescribed at her last office visit.

## 2021-05-23 NOTE — Addendum Note (Signed)
Addended by: Althea Charon on: 05/23/2021 08:08 AM   Modules accepted: Orders

## 2021-05-23 NOTE — Progress Notes (Signed)
Spoke with Sharon Vazquez and she was updated on chest x-ray results. She reports that she continues to have cough. She will get chest x-ray today at [facility].

## 2021-05-23 NOTE — Addendum Note (Signed)
Addended by: Valentina Shaggy on: 05/23/2021 02:24 PM   Modules accepted: Orders

## 2021-05-24 ENCOUNTER — Telehealth: Payer: Self-pay | Admitting: Family

## 2021-05-24 NOTE — Telephone Encounter (Signed)
I called and informed the patient of the esomeprazole. I also informed of the benzonotate and she stated that those never helped her that she has 2 prescriptions of those at home that she hasn't taken because they don't help her cough.

## 2021-05-24 NOTE — Telephone Encounter (Signed)
Also, please let Joua that I would like for her to take her esomeprazole (reflux medication) every day now rather than every other day due to her cough.  Thank you, Althea Charon, FNP

## 2021-05-24 NOTE — Telephone Encounter (Signed)
Patient had chest x ray done. Patient states she is continuously coughing - about 10 to 20 min straight at times. Patient is wondering if Sharon Vazquez could send in some cough medicine that she was previously prescribed - patient could not remember the name of it but states it should be in her chart.   Please advise.

## 2021-05-24 NOTE — Telephone Encounter (Signed)
We can send in benzonatate 100 mg take one capsule by mouth every 8 hours as needed for cough. Quantity #15 with no refills.

## 2021-05-26 ENCOUNTER — Telehealth: Payer: Self-pay

## 2021-05-26 MED ORDER — FLUCONAZOLE 150 MG PO TABS: ORAL_TABLET | ORAL | 0 refills | Status: DC

## 2021-05-26 NOTE — Telephone Encounter (Signed)
You can send in a prescription for fluconazole 150 mg- take 1 tablet by mouth. May take an extra dose in 72 hours if still symptomatic. Quantity 2 tablets with no refills.   Is she having any drainage down her throat? Did she start taking her reflux medicine every day? Is she taking her Symbicort 160/4.5 mcg 2 puffs twice a day with spacer? Is she any better from her last office visit? Does she have any fever?  Please remind her that some times it can take weeks for the cough to go away.  Thank you, Althea Charon, FNP

## 2021-05-26 NOTE — Telephone Encounter (Signed)
Spoke with patient, informed her that Fluconazole has been sent to the pharmacy. Patient was informed to take 1 tablet on day 1 and if she is still symptomatic 72 hours after then she can take another tablet. Patient verbalized understanding. She informed me she is not having drainage down her throat but her nose is still running clear. Patient has started her reflux medication daily. She is using her Symbicort 2 puffs twice a day with spacer. She stated she is feeling better from her last visit and she has not had a fever. Patient understands it may take weeks for her cough to go away she is just ready to be well.

## 2021-05-26 NOTE — Telephone Encounter (Signed)
Patient states she is starting to have a yeast  infection. Patient is requesting 2 diflucan's as this is what she usually has to do.  Patient is also requesting a cough syrup as the Benzonotate pills do not work.  Please [place]

## 2021-05-26 NOTE — Telephone Encounter (Signed)
Error

## 2021-05-26 NOTE — Telephone Encounter (Signed)
Thank you :)

## 2021-05-26 NOTE — Addendum Note (Signed)
Addended by: Herbie Drape on: 05/26/2021 02:30 PM   Modules accepted: Orders

## 2021-05-26 NOTE — Telephone Encounter (Signed)
Patient's medicaid insurance called to check on a prior authorization that was sent to the [facility] office for [employer] [place]. I told them to resend it just to be on the safe side. I did not see a telephone note about the medication.

## 2021-05-27 NOTE — Telephone Encounter (Signed)
Patient already has approval from [facility]

## 2021-06-07 ENCOUNTER — Telehealth: Payer: Self-pay

## 2021-06-07 NOTE — Telephone Encounter (Signed)
Faxed over formulary exception/ Prior Auth. Request Form to [facility]. 06/07/21

## 2021-06-07 NOTE — Telephone Encounter (Signed)
Sharon Vazquez is out today,but she will review the paperwork for the Fasenra injections when she is back in office.

## 2021-06-07 NOTE — Telephone Encounter (Signed)
Thank you :)

## 2021-06-09 NOTE — Telephone Encounter (Signed)
Patient already has approval on file unless something has change with her Ins that I am unaware of

## 2021-06-14 ENCOUNTER — Telehealth: Payer: Self-pay

## 2021-06-14 NOTE — Telephone Encounter (Signed)
Patient was approved for Fasenra through [facility] and that approval is no longer valid. The phone call got disconnected, but the lady said she has faxed over forms to update the approval and have it go through her current insurance. The number that called was [phone]

## 2021-06-14 NOTE — Telephone Encounter (Signed)
Please see message from [place].  Thank you, Althea Charon, FNP

## 2021-06-15 NOTE — Telephone Encounter (Signed)
Spoke to pharmacy and they have approval on file for patient and ordered her next injection

## 2021-06-18 ENCOUNTER — Ambulatory Visit (HOSPITAL_COMMUNITY): Payer: 59 | Attending: Physician Assistant | Admitting: Physical Therapy

## 2021-06-18 ENCOUNTER — Ambulatory Visit: Payer: Medicaid Other

## 2021-06-18 ENCOUNTER — Encounter (HOSPITAL_COMMUNITY): Payer: Self-pay | Admitting: Physical Therapy

## 2021-06-18 ENCOUNTER — Other Ambulatory Visit: Payer: Self-pay

## 2021-06-18 DIAGNOSIS — M5441 Lumbago with sciatica, right side: Secondary | ICD-10-CM | POA: Diagnosis not present

## 2021-06-18 DIAGNOSIS — G8929 Other chronic pain: Secondary | ICD-10-CM | POA: Insufficient documentation

## 2021-06-18 DIAGNOSIS — M5442 Lumbago with sciatica, left side: Secondary | ICD-10-CM | POA: Insufficient documentation

## 2021-06-18 DIAGNOSIS — M6281 Muscle weakness (generalized): Secondary | ICD-10-CM | POA: Diagnosis present

## 2021-06-18 NOTE — Patient Instructions (Signed)
Access Code: [email or web address] RLLHE URL: [email or web address] Date: 06/18/2021 Prepared by: Josue Hector  Exercises Dead Bug - 1 x daily - 5 x weekly - 2 sets - 10 reps Small Range Straight Leg Raise - 1 x daily - 5 x weekly - 2 sets - 10 reps Sidelying Hip Abduction - 1 x daily - 5 x weekly - 2 sets - 10 reps
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
## 2021-06-21 ENCOUNTER — Encounter: Payer: Self-pay | Admitting: Emergency Medicine

## 2021-06-21 ENCOUNTER — Other Ambulatory Visit: Payer: Self-pay

## 2021-06-21 ENCOUNTER — Telehealth: Payer: Self-pay | Admitting: Family

## 2021-06-21 ENCOUNTER — Ambulatory Visit
Admission: EM | Admit: 2021-06-21 | Discharge: 2021-06-21 | Disposition: A | Discharge status: Home / Self Care | Payer: 59 | Attending: Urgent Care | Admitting: Urgent Care

## 2021-06-21 DIAGNOSIS — R0982 Postnasal drip: Secondary | ICD-10-CM | POA: Insufficient documentation

## 2021-06-21 DIAGNOSIS — J029 Acute pharyngitis, unspecified: Secondary | ICD-10-CM | POA: Diagnosis present

## 2021-06-21 DIAGNOSIS — Z1152 Encounter for screening for COVID-19: Secondary | ICD-10-CM | POA: Diagnosis present

## 2021-06-21 DIAGNOSIS — R070 Pain in throat: Secondary | ICD-10-CM | POA: Diagnosis present

## 2021-06-21 LAB — POCT RAPID STREP A (OFFICE): Rapid Strep A Screen: NEGATIVE

## 2021-06-21 MED ORDER — PSEUDOEPHEDRINE HCL 60 MG PO TABS: 60.0000 mg | ORAL_TABLET | Freq: Three times a day (TID) | ORAL | 0 refills | Status: DC | PRN

## 2021-06-21 MED ORDER — CETIRIZINE HCL 10 MG PO TABS: 10.0000 mg | ORAL_TABLET | Freq: Every day | ORAL | 0 refills | Status: DC

## 2021-06-21 MED ORDER — CHLORASEPTIC 1.4 % MT LIQD: 1.0000 | Freq: Three times a day (TID) | OROMUCOSAL | 0 refills | Status: DC | PRN

## 2021-06-21 NOTE — Telephone Encounter (Signed)
Patient called stating she has a sore throat, she felt it last night. She states today it has gotten to the point where she can barely swallow. Patient has been taking cough drops to help with sore throat without relief. Patient did take a at home covid test and tested negative. Patient is wondering if anything could be sent in for her.   [facility] - [address], [place] [address]  Best contact number: [phone]

## 2021-06-21 NOTE — ED Triage Notes (Signed)
Pt reports sore throat x2 days. Pt reports difficulty swallowing today and reports history of same. Pt reports had tonsils and adenoids removed as a child.

## 2021-06-21 NOTE — Telephone Encounter (Signed)
Patient plans to go to urgent care to get tested for strep throat. She had no further questions or concerns.

## 2021-06-21 NOTE — Telephone Encounter (Signed)
Thank you :)

## 2021-06-21 NOTE — Telephone Encounter (Signed)
With her sore throat I would recommend that she go to Urgent care or her primary care physician where she can be tested for strep throat.   Thank you! Althea Charon, FNP
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
## 2021-06-22 ENCOUNTER — Telehealth (HOSPITAL_COMMUNITY): Payer: Self-pay | Admitting: Emergency Medicine

## 2021-06-22 ENCOUNTER — Encounter (HOSPITAL_COMMUNITY): Payer: BC Managed Care – PPO | Admitting: Physical Therapy

## 2021-06-22 LAB — COVID-19, FLU A+B NAA
Influenza A, NAA: NOT DETECTED
Influenza B, NAA: NOT DETECTED
SARS-CoV-2, NAA: NOT DETECTED

## 2021-06-22 LAB — CULTURE, GROUP A STREP (THRC)

## 2021-06-22 MED ORDER — AMOXICILLIN 250 MG/5ML PO SUSR: 500.0000 mg | Freq: Two times a day (BID) | ORAL | 0 refills | Status: AC

## 2021-06-22 MED ORDER — FLUCONAZOLE 150 MG PO TABS: 150.0000 mg | ORAL_TABLET | Freq: Once | ORAL | 0 refills | Status: AC

## 2021-06-24 NOTE — Patient Instructions (Incomplete)
Asthma Continue Symbicort 160/4.5 mcg using 2 puffs twice a day with spacer to help prevent cough and wheeze.   Continue montelukast 10 mg once a day to prevent cough and wheeze.  We will put in a refill for this medication Continue albuterol 2 puffs every 4 hours as needed for cough or wheeze OR Instead use albuterol 0.083% solution via nebulizer one unit vial every 4 hours as needed for cough or wheeze Continue receiving Fasenra injections once every 8 weeks and have access to an epinephrine injector set  Allergic rhinitis Continue Xyzal 5 mg once a day as needed for a runny nose.Remember to rotate to a different antihistamine about every 3 months. Some examples of over the counter antihistamines include Zyrtec (cetirizine), Xyzal (levocetirizine), Allegra (fexofenadine), and Claritin (loratidine).  Continue montelukast Continue Atrovent 2 sprays in each nostril twice a day as needed for runny nose Continue Flonase 1-2 sprays in each nostril once a day as needed for a stuffy nose  Allergic conjunctivitis Continue refresh eyedrops as recommended by your eye doctor  Reflux Continue dietary and lifestyle modifications as listed below Continue esomeprazole 40 mg once every other day as previously prescribed by your gastroenterologist  Stinging insect allergy Continue to avoid stinging insects.  In case of an allergic reaction, take Benadryl 50 mg every 4 hours, and if life-threatening symptoms occur, inject with EpiPen 0.3 mg.  Call the clinic if this treatment plan is not working well for you  Follow up in months weeks or sooner if needed.

## 2021-06-25 ENCOUNTER — Other Ambulatory Visit: Payer: Self-pay

## 2021-06-25 ENCOUNTER — Ambulatory Visit: Payer: 59 | Admitting: Family

## 2021-06-25 ENCOUNTER — Ambulatory Visit: Payer: Medicaid Other

## 2021-06-25 ENCOUNTER — Encounter: Payer: Self-pay | Admitting: Family

## 2021-06-25 ENCOUNTER — Ambulatory Visit (INDEPENDENT_AMBULATORY_CARE_PROVIDER_SITE_OTHER): Payer: 59 | Admitting: Family

## 2021-06-25 VITALS — BP 116/80 | HR 112 | Temp 98.2°F | Resp 18 | Ht 66.0 in | Wt 179.0 lb

## 2021-06-25 DIAGNOSIS — J3089 Other allergic rhinitis: Secondary | ICD-10-CM

## 2021-06-25 DIAGNOSIS — T6391XD Toxic effect of contact with unspecified venomous animal, accidental (unintentional), subsequent encounter: Secondary | ICD-10-CM

## 2021-06-25 DIAGNOSIS — J4550 Severe persistent asthma, uncomplicated: Secondary | ICD-10-CM

## 2021-06-25 DIAGNOSIS — H1013 Acute atopic conjunctivitis, bilateral: Secondary | ICD-10-CM

## 2021-06-25 DIAGNOSIS — K219 Gastro-esophageal reflux disease without esophagitis: Secondary | ICD-10-CM

## 2021-06-25 NOTE — Progress Notes (Signed)
[place], [address] [place] [address] Dept: [phone]  FOLLOW UP NOTE  Patient ID: Sharon Vazquez, female    DOB: March 14, 1981  Age: 41 y.o. MRN: [id number] Date of Office Visit: 06/25/2021  Assessment  Chief Complaint: Asthma (Says her asthma is well. No issues or concerns at this time. )  HPI Arcadia Bosques is a who presents today for follow-up of of moderate persistent asthma with acute exacerbation, acute nonrecurrent sinusitis, nonseasonal allergic rhinitis, allergic conjunctivitis, toxic effect of venom, gastroesophageal reflux disease, and acute cough.  She was last seen on May 21, 2021 by Althea Charon, FNP.  She reports that she was diagnosed with strep throat on Tuesday and is currently on 10 days worth of amoxicillin.   Asthma is reported as doing better with Symbicort 160/4.5 mcg 2 puffs twice a day with spacer, Singulair 10 mg once daily, Fasenra injections every 8 weeks, and albuterol as needed.  She reports that she did stop coughing after her last office visit and then within the past 2 to 3 days the cough came back.  This cough is described as a little bit of cough that is productive with brownish/yellowish sputum with a little bit of blood.  She denies fever, chills, wheezing, tightness in her chest, shortness of breath, and nocturnal awakenings due to breathing problems.  She has not been on any other systemic steroids since we last saw her.  She reports that she has to use her albuterol every blue moon. She reports that she quit smoking cigarettes 4 years ago but does vape a couple times a day.  Since her last office visit she did have a chest x-ray on May 21, 2021 showing:" vertical linear opacity overlying right hemithorax favored to be artifactual and due to overlying soft tissue interface, although a small pneumothorax cannot be excluded.  Recommended repeat PA and lateral chest x-ray."  She had a repeat PA and lateral chest x-ray on May 23, 2021 showing: "No active cardiopulmonary disease."  .  Allergic rhinitis is reported as moderately controlled with Xyzal 5 mg once a day sinus rinses, and Atrovent nasal spray as needed.  She has not been using Flonase nasal spray.  She reports that when she went to urgent care for strep throat she was given also Sudafed and Chloraseptic.  She reports a little bit of rhinorrhea that is yellowish with a little bit of blood.  She reports that this started around the same time as her having productive cough with brownish/yellow sputum with a little of blood.  She also reports a little post nasal drip and she denies nasal congestion . She has not had any other sinus infection since we last saw her and she finished the Augmentin that was given at the last office visit.  Allergic conjunctivitis is reported as moderately controlled with Restasis and another eye drop that her eye doctor has her on. She reports occasional itchy watery eyes.  Reflux is reported as controlled with esomeprazole 40 mg every other day. She denies any heartburn or reflux symptoms.  She continues to avoid stinging insects and has not had any insect stings since we last saw her.        Drug Allergies:  Allergies  Allergen Reactions   Bee Venom Anaphylaxis   Dilaudid [Hydromorphone Hcl] Anaphylaxis and Hives    Has tolerated morphine since this reaction with Benadryl   Peanut Oil Anaphylaxis   Senna Anaphylaxis and Hives   Adhesive [Tape] Hives and  Other (See Comments)    Pulls skin off (use paper tape)   Latex Hives    Review of Systems: Review of Systems  Constitutional:  Negative for chills and fever.       She reports being sleepy while in the office today   She reports her mother-in-law is driving her and that she had only slept 7 hours in the past 2 days  HENT:         Reports sore throat for which she went to urgent care on Monday and was diagnosed with strep throat.  She was given 10 days worth of amoxicillin  which she started on Tuesday.  She also reports a little bit of rhinorrhea that is yellowish with little bit of blood and some postnasal drip that is little better since starting Sudafed and Chloraseptic spray prescribed by urgent care.  She denies nasal congestion.  She has not had any sinus infections since we last saw her.  Respiratory:  Positive for cough. Negative for shortness of breath and wheezing.        Reports little bit of cough that is productive with brown and yellow with a little bit of blood colored sputum.  She denies wheezing, tightness in her chest, shortness of breath, and nocturnal awakenings  Cardiovascular:  Negative for chest pain and palpitations.  Gastrointestinal:        Denies heartburn or reflux symptoms with his esomeprazole 40 mg every other day  Genitourinary:  Negative for frequency.  Skin:  Negative for itching and rash.  Neurological:  Negative for headaches.  Endo/Heme/Allergies:  Positive for environmental allergies.    Physical Exam: BP 116/80    Pulse (!) 112    Temp 98.2 F (36.8 C) (Temporal)    Resp 18    Ht 5\' 6"  (1.676 m)    Wt 179 lb (81.2 kg)    LMP 06/01/2021 (Exact Date)    SpO2 100%    BMI 28.89 kg/m    Physical Exam Constitutional:      Appearance: Normal appearance.  HENT:     Head: Normocephalic and atraumatic.     Comments: Pharynx normal, eyes normal, ears normal, nose: Bilateral lower turbinates mildly edematous and erythematous with no drainage or bleeding noted    Right Ear: Tympanic membrane, ear canal and external ear normal.     Left Ear: Tympanic membrane, ear canal and external ear normal.     Mouth/Throat:     Mouth: Mucous membranes are moist.     Pharynx: Oropharynx is clear.  Eyes:     Conjunctiva/sclera: Conjunctivae normal.  Cardiovascular:     Rate and Rhythm: Regular rhythm.     Heart sounds: Normal heart sounds.  Pulmonary:     Effort: Pulmonary effort is normal.     Breath sounds: Normal breath sounds.      Comments: Lungs clear to auscultation Musculoskeletal:     Cervical back: Neck supple.  Skin:    General: Skin is warm.  Neurological:     Mental Status: She is alert and oriented to person, place, and time.  Psychiatric:        Mood and Affect: Mood normal.        Behavior: Behavior normal.        Thought Content: Thought content normal.        Judgment: Judgment normal.    Diagnostics: FVC 3.47 L, FEV1 2.60 L.  (94%).  Predicted FVC 3.37 L, predicted FEV1 2.77 L.  Spirometry indicates normal respiratory function.   Assessment and Plan: 1. Severe persistent asthma without complication   2. Non-seasonal allergic rhinitis due to other allergic trigger   3. Allergic conjunctivitis of both eyes   4. Toxic effect of venom, accidental or unintentional, subsequent encounter   5. Gastroesophageal reflux disease, unspecified whether esophagitis present     No orders of the defined types were placed in this encounter.   Patient Instructions  Asthma Continue Symbicort 160/4.5 mcg using 2 puffs twice a day with spacer to help prevent cough and wheeze.   Continue montelukast 10 mg once a day to prevent cough and wheeze.  Continue albuterol 2 puffs every 4 hours as needed for cough or wheeze OR Instead use albuterol 0.083% solution via nebulizer one unit vial every 4 hours as needed for cough or wheeze Continue receiving Fasenra injections once every 8 weeks and have access to an epinephrine injector set  Allergic rhinitis Continue Xyzal 5 mg once a day as needed for a runny nose.Remember to rotate to a different antihistamine about every 3 months. Some examples of over the counter antihistamines include Zyrtec (cetirizine), Xyzal (levocetirizine), Allegra (fexofenadine), and Claritin (loratidine).  Continue montelukast  Start Atrovent 2 sprays in each nostril twice a day as needed for runny nose/drainage down throat Continue Flonase 1-2 sprays in each nostril once a day as needed for a  stuffy nose Recommend stopping Sudafed  Allergic conjunctivitis Continue refresh eyedrops as recommended by your eye doctor  Reflux Continue dietary and lifestyle modifications as listed below Continue esomeprazole 40 mg once every other day as previously prescribed by your gastroenterologist  Stinging insect allergy Continue to avoid stinging insects.  In case of an allergic reaction, take Benadryl 50 mg every 4 hours, and if life-threatening symptoms occur, inject with EpiPen 0.3 mg.  Continue Amoxicillin for strep throat. Let us know if you are not getting any better and continue to blow/cough blood  Recommend stop vaping Call the clinic if this treatment plan is not working well for you  Follow up in 3 months or sooner if needed.  Return in about 3 months (around 09/23/2021), or if symptoms worsen or fail to improve.    Thank you for the opportunity to care for this patient.  Please do not hesitate to contact me with questions.  Althea Charon, FNP [facility]

## 2021-06-25 NOTE — Patient Instructions (Addendum)
Asthma Continue Symbicort 160/4.5 mcg using 2 puffs twice a day with spacer to help prevent cough and wheeze.   Continue montelukast 10 mg once a day to prevent cough and wheeze.  Continue albuterol 2 puffs every 4 hours as needed for cough or wheeze OR Instead use albuterol 0.083% solution via nebulizer one unit vial every 4 hours as needed for cough or wheeze Continue receiving Fasenra injections once every 8 weeks and have access to an epinephrine injector set  Allergic rhinitis Continue Xyzal 5 mg once a day as needed for a runny nose.Remember to rotate to a different antihistamine about every 3 months. Some examples of over the counter antihistamines include Zyrtec (cetirizine), Xyzal (levocetirizine), Allegra (fexofenadine), and Claritin (loratidine).  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  In case of an allergic reaction, take Benadryl 50 mg every 4 hours, and if life-threatening symptoms occur, inject with EpiPen 0.3 mg.  Continue Amoxicillin for strep throat. Let us know if you are not getting any better and continue to blow/cough blood  Recommend stop vaping Call the clinic if this treatment plan is not working well for you  Follow up in 3 months or sooner if needed.

## 2021-06-28 ENCOUNTER — Telehealth: Payer: Self-pay | Admitting: *Deleted

## 2021-06-28 NOTE — Telephone Encounter (Signed)
Called patient to advise change or pharmacy due to Ins change and copay card sent also

## 2021-06-29 ENCOUNTER — Telehealth: Payer: Self-pay | Admitting: Family

## 2021-06-29 NOTE — Telephone Encounter (Signed)
Please call Sila and see if she is still seeing red when she blows her nose and red when her cough is productive.  Thank you, Althea Charon, FNP

## 2021-06-29 NOTE — Telephone Encounter (Signed)
Called and spoke with patient, she states that she does still have a productive cough but red is present at all.

## 2021-06-30 NOTE — Telephone Encounter (Signed)
So she is still having a productive cough with red sputum or not? What color is she coughing up and how long?

## 2021-06-30 NOTE — Telephone Encounter (Signed)
Called and spoke with the patient and she states that it is brown/golden colored and she has had it for over a week now.

## 2021-06-30 NOTE — Telephone Encounter (Signed)
Please call her on Monday 07/05/21 and see how her productive cough is doing. She should still be taking amoxicillin for strep throat.

## 2021-06-30 NOTE — Telephone Encounter (Signed)
No I'm sorry she is not having red sputum or from coughing or red mucous from blowing her nose.

## 2021-06-30 NOTE — Telephone Encounter (Signed)
What color is she coughing up?

## 2021-07-02 ENCOUNTER — Encounter (HOSPITAL_COMMUNITY): Payer: BC Managed Care – PPO | Admitting: Physical Therapy

## 2021-07-06 ENCOUNTER — Telehealth: Payer: Self-pay | Admitting: Adult Health

## 2021-07-06 NOTE — Telephone Encounter (Signed)
Great. Thank you.

## 2021-07-06 NOTE — Telephone Encounter (Signed)
Patient called stating that she would like to know if the prenatal vitamins [facility] has prescribed is the strongest they have, also she would like to know if there is another fertility clinic she can go to, because she had one scheduled and she changed insurance and they do not take her insurance. Pt would like [facility] Malta to send her a Therapist, music

## 2021-07-06 NOTE — Telephone Encounter (Signed)
Called and spoke with patient, she states that she is feeling much better and only has a little bit of nasal congestion residing. She denies any cough or production from coughing, she denies any fever. She sounded much better on the phone.

## 2021-07-06 NOTE — Telephone Encounter (Signed)
Can someone please call Sharon Vazquez and see if she still has productive cough? If yes what color? Any fever?  Thank you, [name] Hibba Schram,FNP

## 2021-07-07 NOTE — Telephone Encounter (Signed)
Pt aware prenatal vits are good and she can call [facility] @ [phone]. Pt voiced understanding. [place]

## 2021-07-09 ENCOUNTER — Encounter (HOSPITAL_COMMUNITY): Payer: BC Managed Care – PPO | Admitting: Physical Therapy

## 2021-07-15 NOTE — Telephone Encounter (Signed)
Patient called stating she received a voicemail from her the pharmacy sating she needs a prior auth for her Berna Bue.  Patient does have [place] Falling now.

## 2021-07-16 NOTE — Telephone Encounter (Signed)
Called patient and advised [employer] approval in place but after and hour trying to fix same will have to submit new approval for 56 days

## 2021-07-19 ENCOUNTER — Other Ambulatory Visit (INDEPENDENT_AMBULATORY_CARE_PROVIDER_SITE_OTHER): Payer: Self-pay | Admitting: Internal Medicine

## 2021-08-13 ENCOUNTER — Other Ambulatory Visit: Payer: Self-pay | Admitting: Orthopedic Surgery

## 2021-08-13 DIAGNOSIS — M542 Cervicalgia: Secondary | ICD-10-CM

## 2021-08-20 ENCOUNTER — Other Ambulatory Visit: Payer: Self-pay

## 2021-08-20 ENCOUNTER — Ambulatory Visit (INDEPENDENT_AMBULATORY_CARE_PROVIDER_SITE_OTHER): Payer: 59

## 2021-08-20 DIAGNOSIS — J4550 Severe persistent asthma, uncomplicated: Secondary | ICD-10-CM | POA: Diagnosis not present

## 2021-09-02 ENCOUNTER — Ambulatory Visit
Admission: RE | Admit: 2021-09-02 | Discharge: 2021-09-02 | Disposition: A | Discharge status: Home / Self Care | Payer: 59 | Source: Ambulatory Visit | Attending: Orthopedic Surgery | Admitting: Orthopedic Surgery

## 2021-09-02 DIAGNOSIS — M542 Cervicalgia: Secondary | ICD-10-CM

## 2021-09-05 ENCOUNTER — Telehealth: Payer: 59 | Admitting: Family

## 2021-09-05 DIAGNOSIS — M5412 Radiculopathy, cervical region: Secondary | ICD-10-CM

## 2021-09-05 MED ORDER — PREDNISONE 10 MG (21) PO TBPK: ORAL_TABLET | ORAL | 0 refills | Status: DC

## 2021-09-05 NOTE — Progress Notes (Signed)
We are sorry that you are not feeling well.  Here is how we plan to help! ? ?Based on what you have shared with me it looks like you mostly have cervical radiculopathy. ? ?Acute back pain is defined as musculoskeletal pain that can resolve in 1-3 weeks with conservative treatment. ? ?I have prescribed  a steroid dose pack for you. Continue your diclofenac 75 mg twice a day and flexeril. Some patients experience stomach irritation or in increased heartburn with anti-inflammatory drugs.  Please keep in mind that muscle relaxer's can cause fatigue and should not be taken while at work or driving.  Back pain is very common.  The pain often gets better over time.  The cause of back pain is usually not dangerous.  Most people can learn to manage their back pain on their own. ? ?I have also sent your work note to your MyChart. You will have to wait until your Ortho provider reads your MRI to interpret.  ? ?Home Care ?Stay active.  Start with short walks on flat ground if you can.  Try to walk farther each day. ?Do not sit, drive or stand in one place for more than 30 minutes.  Do not stay in bed. ?Do not avoid exercise or work.  Activity can help your back heal faster. ?Be careful when you bend or lift an object.  Bend at your knees, keep the object close to you, and do not twist. ?Sleep on a firm mattress.  Lie on your side, and bend your knees.  If you lie on your back, put a pillow under your knees. ?Only take medicines as told by your doctor. ?Put ice on the injured area. ?Put ice in a plastic bag ?Place a towel between your skin and the bag ?Leave the ice on for 15-20 minutes, 3-4 times a day for the first 2-3 days. 210 After that, you can switch between ice and heat packs. ?Ask your doctor about back exercises or massage. ?Avoid feeling anxious or stressed.  Find good ways to deal with stress, such as exercise. ? ?Get Help Right Way If: ?Your pain does not go away with rest or medicine. ?Your pain does not go away in  1 week. ?You have new problems. ?You do not feel well. ?The pain spreads into your legs. ?You cannot control when you poop (bowel movement) or pee (urinate) ?You feel sick to your stomach (nauseous) or throw up (vomit) ?You have belly (abdominal) pain. ?You feel like you may pass out (faint). ?If you develop a fever. ? ?Make Sure you: ?Understand these instructions. ?Will watch your condition ?Will get help right away if you are not doing well or get worse. ? ?Your e-visit answers were reviewed by a board certified advanced clinical practitioner to complete your personal care plan.  Depending on the condition, your plan could have included both over the counter or prescription medications. ? ?If there is a problem please reply  once you have received a response from your provider. ? ?Your safety is important to [place].  If you have drug allergies check your prescription carefully.   ? ?You can use MyChart to ask questions about today?s visit, request a non-urgent call back, or ask for a work or school excuse for 24 hours related to this e-Visit. If it has been greater than 24 hours you will need to follow up with your provider, or enter a new e-Visit to address those concerns. ? ?You will get an e-mail in  the next two days asking about your experience.  I hope that your e-visit has been valuable and will speed your recovery. Thank you for using e-visits. ? ?Approximately 5 minutes was spent documenting and reviewing patient's chart.  ? ?

## 2021-10-08 ENCOUNTER — Other Ambulatory Visit: Payer: Self-pay | Admitting: Orthopedic Surgery

## 2021-10-08 DIAGNOSIS — M5416 Radiculopathy, lumbar region: Secondary | ICD-10-CM

## 2021-10-15 ENCOUNTER — Telehealth: Payer: Self-pay

## 2021-10-15 ENCOUNTER — Ambulatory Visit (INDEPENDENT_AMBULATORY_CARE_PROVIDER_SITE_OTHER): Payer: 59

## 2021-10-15 DIAGNOSIS — J4550 Severe persistent asthma, uncomplicated: Secondary | ICD-10-CM | POA: Diagnosis not present

## 2021-10-15 NOTE — Telephone Encounter (Signed)
She needs to just come in for an appointment. Chronic cough is a large differential and we need some more info.  ? ?Salvatore Marvel, MD ?[facility] ? ?

## 2021-10-15 NOTE — Telephone Encounter (Signed)
Patient came in this morning to get her shot. She states she has been having a bad cough since she had an URI in December. She is wondering if she can have some cough syrup? ? ?[facility] [address]  ?

## 2021-10-16 ENCOUNTER — Ambulatory Visit
Admission: RE | Admit: 2021-10-16 | Discharge: 2021-10-16 | Disposition: A | Discharge status: Home / Self Care | Payer: 59 | Source: Ambulatory Visit | Attending: Orthopedic Surgery | Admitting: Orthopedic Surgery

## 2021-10-16 DIAGNOSIS — M5416 Radiculopathy, lumbar region: Secondary | ICD-10-CM

## 2021-10-18 NOTE — Telephone Encounter (Signed)
I called the patient and left her a message for her to call back and set an appointment to be seen for her cough symptoms. ?

## 2021-10-29 NOTE — Telephone Encounter (Signed)
Noted!   Salvatore Marvel, MD [facility] [employer] [facility] [place]

## 2021-11-02 ENCOUNTER — Ambulatory Visit (INDEPENDENT_AMBULATORY_CARE_PROVIDER_SITE_OTHER): Payer: 59 | Admitting: Allergy & Immunology

## 2021-11-02 ENCOUNTER — Encounter: Payer: Self-pay | Admitting: Allergy & Immunology

## 2021-11-02 DIAGNOSIS — J4550 Severe persistent asthma, uncomplicated: Secondary | ICD-10-CM

## 2021-11-02 DIAGNOSIS — J3089 Other allergic rhinitis: Secondary | ICD-10-CM

## 2021-11-02 DIAGNOSIS — K219 Gastro-esophageal reflux disease without esophagitis: Secondary | ICD-10-CM | POA: Diagnosis not present

## 2021-11-02 DIAGNOSIS — T6391XD Toxic effect of contact with unspecified venomous animal, accidental (unintentional), subsequent encounter: Secondary | ICD-10-CM

## 2021-11-02 DIAGNOSIS — R053 Chronic cough: Secondary | ICD-10-CM

## 2021-11-02 MED ORDER — GUAIFENESIN-CODEINE 100-10 MG/5ML PO SOLN: 10.0000 mL | Freq: Three times a day (TID) | ORAL | 0 refills | Status: DC | PRN

## 2021-11-02 NOTE — Progress Notes (Signed)
RE: Sharon Vazquez MRN: [id number] DOB: 18-Nov-1980 Date of Telemedicine Visit: 11/02/2021  Referring provider: Dianne Dun, MD Primary care provider: Dianne Dun, MD  Chief Complaint: Cough (Been 2 months--no wheezing or sob--been using albuterol neb treatment 2-3 times in the past a couple months--albuterol inhaler every a couple days)   Telemedicine Follow Up Visit via Telephone: I connected with [place] for a follow up on 11/02/21 by telephone and verified that I am speaking with the correct person using two identifiers.   I discussed the limitations, risks, security and privacy concerns of performing an evaluation and management service by telephone and the availability of in person appointments. I also discussed with the patient that there may be a patient responsible charge related to this service. The patient expressed understanding and agreed to proceed.  Patient is at work.  Provider is at the office.  Visit start time: 11:14 AM Visit end time: 11:39 AM Insurance consent/check in by: Vallarie Mare Medical consent and medical assistant/nurse: Vallarie Mare  History of Present Illness:  She is a 41 y.o. female, who is being followed for moderate persistent asthma as well as perennial and seasonal allergic rhinitis and stinging insect hypersensitivity. Her previous allergy office visit was in January 2023 with Althea Charon, FNP.  She was last seen in January 2023.  At that time, patient [name] our nurse practitioner continue with Symbicort 160 mcg 2 puffs twice daily as well as montelukast and albuterol.  She remained on Fasenra every 8 weeks.  For her allergic rhinitis, we continue with Xyzal 5 mg daily and montelukast.  We started her on Atrovent 2 sprays per nostril twice daily as well as Flonase.  For her stinging insect allergy, we continue to avoid stinging insects.  We made sure her EpiPen is up-to-date.  She was already on amoxicillin for strep throat and she continued  with that.  Since last visit, she reports that she has had a chronic cough for two months since she had amoxicillin. This is a wheezing cough with intermittent cough and phlegm production. She has had no fever. Cough is worse when she gets up in the morning. Of note, she does have omeprazole. She does have albuterol and it has not helped much. She has been on prednisone twice for this in the last two months for her back doctor; this has not helped at all for her coughing.   She remains on her Symbicort and she is up to date on her Berna Bue. She has not been overusing her albuterol during these past few months since it did not seem to help much. She has not been tested for COVID19. Overall symptoms have not been well controlled over the last two months.   Allergic rhinitis symptoms are controlled with the montelukast and the levocetirizine. She has not been on antibiotics (aside from the amoxicillin for her Strep throat). She remains on her Atrovent and Flonase.   She does have an up to date EpiPen.   Otherwise, there have been no changes to her past medical history, surgical history, family history, or social history.  Assessment and Plan:  Amaryllis is a 41 y.o. female with:  Severe persistent asthma without complication - previously doing well on Fasenra  Chronic cough   Non-seasonal allergic rhinitis due to other allergic trigger  Gastroesophageal reflux disease  Toxic effect of venom - EpiPen up to date   Since your symptoms have been going on for so long, we are going to get a chest x-ray  and see that were not missing anything in her lungs.  I did print off a prescription for a prescription cough medicine that contains codeine to get her through this coughing episode.  We will evaluate what to do next once her chest x-ray is back.  She was made aware of where it was ordered for.  She was actually works in [place] so this location is easy to get to for  her.  Diagnostics: None.  Medication List:  Current Outpatient Medications  Medication Sig Dispense Refill   albuterol (PROVENTIL) (2.5 MG/3ML) 0.083% nebulizer solution Take 3 mLs (2.5 mg total) by nebulization every 4 (four) hours as needed for wheezing or shortness of breath. 75 mL 1   albuterol (VENTOLIN HFA) 108 (90 Base) MCG/ACT inhaler INHALE 1-2 PUFFS EVERY 6 HOURS AS NEEDED FOR WHEEZING. 18 g 0   alprazolam (XANAX) 2 MG tablet Take 2 mg by mouth in the morning and at bedtime.     budesonide-formoterol (SYMBICORT) 160-4.5 MCG/ACT inhaler Inhale 2 puffs into the lungs in the morning and at bedtime. 1 each 5   cyclobenzaprine (FLEXERIL) 10 MG tablet Take 1 tablet (10 mg total) by mouth 2 (two) times daily as needed for muscle spasms. (Patient taking differently: Take 10 mg by mouth 3 (three) times daily.) 20 tablet 0   diclofenac (VOLTAREN) 75 MG EC tablet Take 1 tablet (75 mg total) by mouth 2 (two) times daily as needed for moderate pain. Take 1 tablet by mouth two times daily after meals for inflammation, pain, and swelling. (Patient taking differently: Take 75 mg by mouth 2 (two) times daily as needed for moderate pain. Take 1 tablet by mouth two times daily after meals for inflammation, pain, and swelling.) 60 tablet 2   diclofenac Sodium (VOLTAREN) 1 % GEL SMARTSIG:Gram(s) Topical 1-3 Times Daily PRN     EPINEPHrine 0.3 mg/0.3 mL IJ SOAJ injection Inject 0.3 mg into the muscle as needed for anaphylaxis. 2 each 0   esomeprazole (NEXIUM) 40 MG capsule TAKE 1 CAPSULE ONCE DAILY BEFORE BREAKFAST. 30 capsule 0   FASENRA 30 MG/ML SOSY INJECT THE CONTENTS OF 1 SYRINGE (30 MG) UNDER THE SKIN EVERY 8 WEEKS 1 mL 6   guaiFENesin-codeine 100-10 MG/5ML syrup Take 10 mLs by mouth 3 (three) times daily as needed for cough. 120 mL 0   levocetirizine (XYZAL) 5 MG tablet Take 1 tablet (5 mg total) by mouth every evening. (Patient taking differently: Take 5 mg by mouth daily as needed for allergies.) 30  tablet 5   metoprolol succinate (TOPROL-XL) 100 MG 24 hr tablet Take 100 mg by mouth daily.      mirtazapine (REMERON) 30 MG tablet Take 30 mg by mouth at bedtime.     montelukast (SINGULAIR) 10 MG tablet Take 1 tablet (10 mg total) by mouth at bedtime. 30 tablet 5   nystatin (MYCOSTATIN/NYSTOP) powder Apply 1 application topically 3 (three) times daily. 60 g 12   ondansetron (ZOFRAN ODT) 4 MG disintegrating tablet Take 1 tablet (4 mg total) by mouth every 8 (eight) hours as needed for nausea or vomiting. 20 tablet 0   Oxycodone HCl 10 MG TABS Take 10 mg by mouth 5 (five) times daily.     Plecanatide (TRULANCE) 3 MG TABS Take 3 mg by mouth daily as needed. 90 tablet 3   Prenatal Vit-Fe Fumarate-FA (PNV PRENATAL PLUS MULTIVITAMIN) 27-1 MG TABS Take 1 daily 30 tablet 12   RESTASIS 0.05 % ophthalmic emulsion SMARTSIG:1 Drop(s) In  Eye(s) Every 12 Hours     SUMAtriptan (IMITREX) 50 MG tablet Take 50 mg by mouth every 2 (two) hours as needed for migraine.     VICTOZA 18 MG/3ML SOPN Inject 1.8 mg into the skin daily.     zolpidem (AMBIEN) 10 MG tablet Take 10 mg by mouth at bedtime as needed for sleep.     EYSUVIS 0.25 % SUSP Apply to eye.     gabapentin (NEURONTIN) 400 MG capsule Take 400-1,200 mg by mouth See admin instructions. Take 400 mg by mouth three times daily and take 1200 mg at bedtime     lamoTRIgine (LAMICTAL) 150 MG tablet Take 150 mg by mouth daily.      predniSONE (STERAPRED UNI-PAK 21 TAB) 10 MG (21) TBPK tablet Use as directed 21 tablet 0   Current Facility-Administered Medications  Medication Dose Route Frequency Provider Last Rate Last Admin   Benralizumab SOSY 30 mg  30 mg Subcutaneous Q8 Weeks Kennith Gain, MD   30 mg at 10/15/21 1503   Allergies: Allergies  Allergen Reactions   Bee Venom Anaphylaxis   Dilaudid [Hydromorphone Hcl] Anaphylaxis and Hives    Has tolerated morphine since this reaction with Benadryl   Peanut Oil Anaphylaxis   Senna Anaphylaxis and  Hives   Adhesive [Tape] Hives and Other (See Comments)    Pulls skin off (use paper tape)   Latex Hives   I reviewed her past medical history, social history, family history, and environmental history and no significant changes have been reported from previous visits.  Review of Systems  Constitutional:  Negative for activity change, appetite change, chills, diaphoresis and fatigue.  HENT:  Negative for congestion, postnasal drip, rhinorrhea, sinus pressure, sore throat and tinnitus.   Eyes:  Negative for pain, discharge, redness and itching.  Respiratory:  Positive for cough. Negative for shortness of breath, wheezing and stridor.   Gastrointestinal:  Negative for diarrhea, nausea and vomiting.  Endocrine: Negative for cold intolerance and heat intolerance.  Musculoskeletal:  Negative for arthralgias, joint swelling and myalgias.  Skin:  Negative for rash.  Allergic/Immunologic: Negative for environmental allergies and food allergies.   Objective:  Physical exam not obtained as encounter was done via telephone.   Previous notes and tests were reviewed.  I discussed the assessment and treatment plan with the patient. The patient was provided an opportunity to ask questions and all were answered. The patient agreed with the plan and demonstrated an understanding of the instructions.   The patient was advised to call back or seek an in-person evaluation if the symptoms worsen or if the condition fails to improve as anticipated.  I provided 25 minutes of non-face-to-face time during this encounter.  It was my pleasure to participate in [place] Garant's care today. Please feel free to contact me with any questions or concerns.   Sincerely,  Valentina Shaggy, MD

## 2021-11-05 ENCOUNTER — Telehealth: Payer: Self-pay

## 2021-11-05 DIAGNOSIS — R053 Chronic cough: Secondary | ICD-10-CM

## 2021-11-08 ENCOUNTER — Ambulatory Visit (HOSPITAL_COMMUNITY)
Admission: RE | Admit: 2021-11-08 | Discharge: 2021-11-08 | Disposition: A | Discharge status: Home / Self Care | Payer: 59 | Source: Ambulatory Visit | Attending: Allergy & Immunology | Admitting: Allergy & Immunology

## 2021-11-08 DIAGNOSIS — R053 Chronic cough: Secondary | ICD-10-CM | POA: Insufficient documentation

## 2021-11-10 ENCOUNTER — Other Ambulatory Visit (INDEPENDENT_AMBULATORY_CARE_PROVIDER_SITE_OTHER): Payer: Self-pay | Admitting: Internal Medicine

## 2021-11-10 NOTE — Telephone Encounter (Signed)
Last seen 07/2020, Next appt with Dr. Jenetta Downer in Oct 2023.

## 2021-11-11 ENCOUNTER — Encounter: Payer: Self-pay | Admitting: Allergy & Immunology

## 2021-12-03 ENCOUNTER — Other Ambulatory Visit: Payer: Self-pay | Admitting: Orthopedic Surgery

## 2021-12-10 ENCOUNTER — Other Ambulatory Visit: Payer: Self-pay

## 2021-12-10 ENCOUNTER — Emergency Department (HOSPITAL_COMMUNITY)
Admission: EM | Admit: 2021-12-10 | Discharge: 2021-12-11 | Disposition: A | Discharge status: Home / Self Care | Payer: 59 | Attending: Student | Admitting: Student

## 2021-12-10 ENCOUNTER — Encounter (HOSPITAL_COMMUNITY): Payer: Self-pay

## 2021-12-10 ENCOUNTER — Emergency Department (HOSPITAL_COMMUNITY): Payer: 59

## 2021-12-10 DIAGNOSIS — Z794 Long term (current) use of insulin: Secondary | ICD-10-CM | POA: Diagnosis not present

## 2021-12-10 DIAGNOSIS — Z91040 Latex allergy status: Secondary | ICD-10-CM | POA: Insufficient documentation

## 2021-12-10 DIAGNOSIS — S70311A Abrasion, right thigh, initial encounter: Secondary | ICD-10-CM | POA: Diagnosis not present

## 2021-12-10 DIAGNOSIS — S79921A Unspecified injury of right thigh, initial encounter: Secondary | ICD-10-CM | POA: Diagnosis present

## 2021-12-10 DIAGNOSIS — M7989 Other specified soft tissue disorders: Secondary | ICD-10-CM | POA: Insufficient documentation

## 2021-12-10 DIAGNOSIS — S7011XA Contusion of right thigh, initial encounter: Secondary | ICD-10-CM

## 2021-12-10 DIAGNOSIS — Z91010 Allergy to peanuts: Secondary | ICD-10-CM | POA: Insufficient documentation

## 2021-12-10 LAB — POC URINE PREG, ED: Preg Test, Ur: NEGATIVE

## 2021-12-10 MED ORDER — KETOROLAC TROMETHAMINE 15 MG/ML IJ SOLN
15.0000 mg | Freq: Once | INTRAMUSCULAR | Status: AC
  Administered 2021-12-10: 15 mg via INTRAMUSCULAR
  Filled 2021-12-10: qty 1

## 2021-12-10 MED ORDER — FENTANYL CITRATE PF 50 MCG/ML IJ SOSY
50.0000 ug | PREFILLED_SYRINGE | Freq: Once | INTRAMUSCULAR | Status: AC
  Administered 2021-12-10: 50 ug via INTRAVENOUS
  Filled 2021-12-10: qty 1

## 2021-12-10 MED ORDER — OXYCODONE HCL 5 MG PO TABS
10.0000 mg | ORAL_TABLET | Freq: Once | ORAL | Status: AC
  Administered 2021-12-10: 10 mg via ORAL
  Filled 2021-12-10: qty 2

## 2021-12-10 NOTE — ED Notes (Signed)
[employer] at bedside to take report
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
## 2021-12-11 ENCOUNTER — Emergency Department (HOSPITAL_COMMUNITY): Payer: 59

## 2021-12-11 LAB — CBC WITH DIFFERENTIAL/PLATELET
Abs Immature Granulocytes: 0.03 10*3/uL (ref 0.00–0.07)
Basophils Absolute: 0 10*3/uL (ref 0.0–0.1)
Basophils Relative: 0 %
Eosinophils Absolute: 0 10*3/uL (ref 0.0–0.5)
Eosinophils Relative: 0 %
HCT: 39.1 % (ref 36.0–46.0)
Hemoglobin: 14.6 g/dL (ref 12.0–15.0)
Immature Granulocytes: 0 %
Lymphocytes Relative: 28 %
Lymphs Abs: 3.1 10*3/uL (ref 0.7–4.0)
MCH: 32.8 pg (ref 26.0–34.0)
MCHC: 37.3 g/dL — ABNORMAL HIGH (ref 30.0–36.0)
MCV: 87.9 fL (ref 80.0–100.0)
Monocytes Absolute: 0.6 10*3/uL (ref 0.1–1.0)
Monocytes Relative: 6 %
Neutro Abs: 7.2 10*3/uL (ref 1.7–7.7)
Neutrophils Relative %: 66 %
Platelets: 345 10*3/uL (ref 150–400)
RBC: 4.45 MIL/uL (ref 3.87–5.11)
RDW: 13.2 % (ref 11.5–15.5)
WBC: 10.9 10*3/uL — ABNORMAL HIGH (ref 4.0–10.5)
nRBC: 0 % (ref 0.0–0.2)

## 2021-12-11 LAB — BASIC METABOLIC PANEL
Anion gap: 8 (ref 5–15)
BUN: 8 mg/dL (ref 6–20)
CO2: 22 mmol/L (ref 22–32)
Calcium: 8.6 mg/dL — ABNORMAL LOW (ref 8.9–10.3)
Chloride: 107 mmol/L (ref 98–111)
Creatinine, Ser: 0.79 mg/dL (ref 0.44–1.00)
GFR, Estimated: >60 mL/min (ref >60)
Glucose, Bld: 95 mg/dL (ref 70–99)
Potassium: 3.9 mmol/L (ref 3.5–5.1)
Sodium: 137 mmol/L (ref 135–145)

## 2021-12-11 LAB — CK: Total CK: 69 U/L (ref 38–234)

## 2021-12-11 MED ORDER — OXYCODONE-ACETAMINOPHEN 5-325 MG PO TABS
1.0000 | ORAL_TABLET | Freq: Once | ORAL | Status: AC
  Administered 2021-12-11: 1 via ORAL
  Filled 2021-12-11: qty 1

## 2021-12-11 MED ORDER — IOHEXOL 350 MG/ML SOLN
100.0000 mL | Freq: Once | INTRAVENOUS | Status: AC | PRN
Start: 2021-12-11 — End: 2021-12-11
  Administered 2021-12-11: 100 mL via INTRAVENOUS

## 2021-12-11 NOTE — ED Notes (Signed)
Ice packs applied to right thigh
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
## 2021-12-13 ENCOUNTER — Ambulatory Visit (INDEPENDENT_AMBULATORY_CARE_PROVIDER_SITE_OTHER): Payer: 59

## 2021-12-13 DIAGNOSIS — J4550 Severe persistent asthma, uncomplicated: Secondary | ICD-10-CM

## 2021-12-16 ENCOUNTER — Other Ambulatory Visit: Payer: Self-pay | Admitting: Orthopedic Surgery

## 2021-12-16 DIAGNOSIS — M25561 Pain in right knee: Secondary | ICD-10-CM

## 2021-12-19 ENCOUNTER — Ambulatory Visit
Admission: EM | Admit: 2021-12-19 | Discharge: 2021-12-19 | Disposition: A | Discharge status: Home / Self Care | Payer: 59 | Attending: Family Medicine | Admitting: Family Medicine

## 2021-12-19 ENCOUNTER — Encounter: Payer: Self-pay | Admitting: Emergency Medicine

## 2021-12-19 DIAGNOSIS — S8011XA Contusion of right lower leg, initial encounter: Secondary | ICD-10-CM | POA: Diagnosis present

## 2021-12-19 DIAGNOSIS — N939 Abnormal uterine and vaginal bleeding, unspecified: Secondary | ICD-10-CM | POA: Insufficient documentation
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
## 2021-12-20 LAB — CERVICOVAGINAL ANCILLARY ONLY
Bacterial Vaginitis (gardnerella): NEGATIVE
Candida Glabrata: NEGATIVE
Candida Vaginitis: NEGATIVE
Chlamydia: NEGATIVE
Comment: NEGATIVE
Comment: NEGATIVE
Comment: NEGATIVE
Comment: NEGATIVE
Comment: NEGATIVE
Comment: NORMAL
Neisseria Gonorrhea: NEGATIVE
Trichomonas: NEGATIVE

## 2021-12-21 ENCOUNTER — Ambulatory Visit
Admission: RE | Admit: 2021-12-21 | Discharge: 2021-12-21 | Disposition: A | Discharge status: Home / Self Care | Payer: 59 | Source: Ambulatory Visit | Attending: Orthopedic Surgery | Admitting: Orthopedic Surgery

## 2021-12-21 DIAGNOSIS — M25561 Pain in right knee: Secondary | ICD-10-CM

## 2021-12-24 ENCOUNTER — Other Ambulatory Visit: Payer: Self-pay
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
## 2021-12-28 ENCOUNTER — Encounter: Payer: Self-pay | Admitting: Vascular Surgery

## 2021-12-28 ENCOUNTER — Ambulatory Visit (INDEPENDENT_AMBULATORY_CARE_PROVIDER_SITE_OTHER): Payer: 59 | Admitting: Vascular Surgery

## 2021-12-28 VITALS — BP 114/76 | HR 86 | Temp 98.6°F | Resp 20 | Ht 66.0 in | Wt 180.0 lb

## 2021-12-28 DIAGNOSIS — M5410 Radiculopathy, site unspecified: Secondary | ICD-10-CM

## 2021-12-29 ENCOUNTER — Other Ambulatory Visit: Payer: Self-pay

## 2021-12-29 NOTE — Pre-Procedure Instructions (Signed)
Surgical Instructions    Your procedure is scheduled on January 11, 2022.  Report to [facility] Main Entrance "A" at 6:30 A.M., then check in with the Admitting office.  Call this number if you have problems the morning of surgery:  [phone]   If you have any questions prior to your surgery date call [phone]: Open Monday-Friday 8am-4pm    Remember:  Do not eat after midnight the night before your surgery  You may drink clear liquids until 5:30 the morning of your surgery.   Clear liquids allowed are: Water, Non-Citrus Juices (without pulp), Carbonated Beverages, Clear Tea, Black Coffee Only (NO MILK, CREAM OR POWDERED CREAMER of any kind), and Gatorade.    Take these medicines the morning of surgery with A SIP OF WATER:  lamoTRIgine (LAMICTAL)  metoprolol succinate (TOPROL-XL)  montelukast (SINGULAIR)  Olopatadine HCl  pregabalin (LYRICA)  RESTASIS   varenicline (CHANTIX PAK)   FASENRA   Take these medicines the morning of surgery AS NEEDED:  albuterol (VENTOLIN HFA)  - please bring inhaler with you to hospital  alprazolam Duanne Moron)  budesonide-formoterol (SYMBICORT)  cyclobenzaprine (FLEXERIL)   EPINEPHrine  ondansetron (ZOFRAN ODT)  Oxycodone HCl   SUMAtriptan (IMITREX)   esomeprazole (NEXIUM)   TRULANCE   As of today, STOP taking any Aspirin (unless otherwise instructed by your surgeon) Aleve, Naproxen, Ibuprofen, Motrin, Advil, Goody's, BC's, all herbal medications, fish oil, and all vitamins. This includes your medication diclofenac (VOLTAREN) TABLET and diclofenac Sodium (VOLTAREN) 1 % GEL  STOP taking Liraglutide -Weight Management (SAXENDA) as soon as possible.                     Do NOT Smoke (Tobacco/Vaping) for 24 hours prior to your procedure.  If you use a CPAP at night, you may bring your mask/headgear for your overnight stay.   Contacts, glasses, piercing's, hearing aid's, dentures or partials may not be worn into surgery, please bring cases for  these belongings.    For patients admitted to the hospital, discharge time will be determined by your treatment team.   Patients discharged the day of surgery will not be allowed to drive home, and someone needs to stay with them for 24 hours.  SURGICAL WAITING ROOM VISITATION Patients having surgery or a procedure may have no more than 2 support people in the waiting area - these visitors may rotate.   Children under the age of 25 must have an adult with them who is not the patient. If the patient needs to stay at the hospital during part of their recovery, the visitor guidelines for inpatient rooms apply. Pre-op nurse will coordinate an appropriate time for 1 support person to accompany patient in pre-op.  This support person may not rotate.   Please refer to the [facility] website for the visitor guidelines for Inpatients (after your surgery is over and you are in a regular room).    Special instructions:   [place]- Preparing For Surgery  Before surgery, you can play an important role. Because skin is not sterile, your skin needs to be as free of germs as possible. You can reduce the number of germs on your skin by washing with CHG (chlorahexidine gluconate) Soap before surgery.  CHG is an antiseptic cleaner which kills germs and bonds with the skin to continue killing germs even after washing.    Oral Hygiene is also important to reduce your risk of infection.  Remember - BRUSH YOUR TEETH THE MORNING OF  SURGERY WITH YOUR REGULAR TOOTHPASTE  Please do not use if you have an allergy to CHG or antibacterial soaps. If your skin becomes reddened/irritated stop using the CHG.  Do not shave (including legs and underarms) for at least 48 hours prior to first CHG shower. It is OK to shave your face.  Please follow these instructions carefully.   Shower the NIGHT BEFORE SURGERY and the MORNING OF SURGERY  If you chose to wash your hair, wash your hair first as usual with your normal  shampoo.  After you shampoo, rinse your hair and body thoroughly to remove the shampoo.  Use CHG Soap as you would any other liquid soap. You can apply CHG directly to the skin and wash gently with a scrungie or a clean washcloth.   Apply the CHG Soap to your body ONLY FROM THE NECK DOWN.  Do not use on open wounds or open sores. Avoid contact with your eyes, ears, mouth and genitals (private parts). Wash Face and genitals (private parts)  with your normal soap.   Wash thoroughly, paying special attention to the area where your surgery will be performed.  Thoroughly rinse your body with warm water from the neck down.  DO NOT shower/wash with your normal soap after using and rinsing off the CHG Soap.  Pat yourself dry with a CLEAN TOWEL.  Wear CLEAN PAJAMAS to bed the night before surgery  Place CLEAN SHEETS on your bed the night before your surgery  DO NOT SLEEP WITH PETS.   Day of Surgery: Take a shower with CHG soap. Do not wear jewelry or makeup Do not wear lotions, powders, perfumes/colognes, or deodorant. Do not shave 48 hours prior to surgery.  Men may shave face and neck. Do not bring valuables to the hospital.  [facility] is not responsible for any belongings or valuables. Do not wear nail polish, gel polish, artificial nails, or any other type of covering on natural nails (fingers and toes) If you have artificial nails or gel coating that need to be removed by a nail salon, please have this removed prior to surgery. Artificial nails or gel coating may interfere with anesthesia's ability to adequately monitor your vital signs. Wear Clean/Comfortable clothing the morning of surgery Remember to brush your teeth WITH YOUR REGULAR TOOTHPASTE.   Please read over the following fact sheets that you were given.    If you received a COVID test during your pre-op visit  it is requested that you wear a mask when out in public, stay away from anyone that may not be feeling well  and notify your surgeon if you develop symptoms. If you have been in contact with anyone that has tested positive in the last 10 days please notify you surgeon.

## 2021-12-30 ENCOUNTER — Other Ambulatory Visit: Payer: Self-pay | Admitting: Allergy

## 2021-12-30 ENCOUNTER — Other Ambulatory Visit: Payer: Self-pay

## 2021-12-30 ENCOUNTER — Encounter (HOSPITAL_COMMUNITY)
Admission: RE | Admit: 2021-12-30 | Discharge: 2021-12-30 | Disposition: A | Discharge status: Home / Self Care | Payer: 59 | Source: Ambulatory Visit | Attending: Orthopedic Surgery | Admitting: Orthopedic Surgery

## 2021-12-30 ENCOUNTER — Encounter (HOSPITAL_COMMUNITY): Payer: Self-pay

## 2021-12-30 ENCOUNTER — Encounter (HOSPITAL_COMMUNITY): Payer: Self-pay | Admitting: Physician Assistant

## 2021-12-30 ENCOUNTER — Encounter (HOSPITAL_COMMUNITY): Payer: Self-pay | Admitting: Anesthesiology

## 2021-12-30 VITALS — BP 134/99 | HR 95 | Temp 98.3°F | Resp 18 | Ht 66.0 in | Wt 181.7 lb

## 2021-12-30 DIAGNOSIS — I10 Essential (primary) hypertension: Secondary | ICD-10-CM | POA: Insufficient documentation

## 2021-12-30 DIAGNOSIS — Z01818 Encounter for other preprocedural examination: Secondary | ICD-10-CM

## 2021-12-30 DIAGNOSIS — R7303 Prediabetes: Secondary | ICD-10-CM | POA: Diagnosis not present

## 2021-12-30 DIAGNOSIS — Z01812 Encounter for preprocedural laboratory examination: Secondary | ICD-10-CM | POA: Insufficient documentation

## 2021-12-30 LAB — BASIC METABOLIC PANEL
Anion gap: 11 (ref 5–15)
BUN: 7 mg/dL (ref 6–20)
CO2: 23 mmol/L (ref 22–32)
Calcium: 9.1 mg/dL (ref 8.9–10.3)
Chloride: 103 mmol/L (ref 98–111)
Creatinine, Ser: 1.14 mg/dL — ABNORMAL HIGH (ref 0.44–1.00)
GFR, Estimated: >60 mL/min (ref >60)
Glucose, Bld: 99 mg/dL (ref 70–99)
Potassium: 3.6 mmol/L (ref 3.5–5.1)
Sodium: 137 mmol/L (ref 135–145)

## 2021-12-30 LAB — CBC
HCT: 35.1 % — ABNORMAL LOW (ref 36.0–46.0)
Hemoglobin: 12.6 g/dL (ref 12.0–15.0)
MCH: 32.2 pg (ref 26.0–34.0)
MCHC: 35.9 g/dL (ref 30.0–36.0)
MCV: 89.8 fL (ref 80.0–100.0)
Platelets: 379 10*3/uL (ref 150–400)
RBC: 3.91 MIL/uL (ref 3.87–5.11)
RDW: 13.2 % (ref 11.5–15.5)
WBC: 8.1 10*3/uL (ref 4.0–10.5)
nRBC: 0 % (ref 0.0–0.2)

## 2021-12-30 LAB — HEMOGLOBIN A1C
Hgb A1c MFr Bld: 5.3 % (ref 4.8–5.6)
Mean Plasma Glucose: 105.41 mg/dL

## 2021-12-30 LAB — TYPE AND SCREEN
ABO/RH(D): O POS
Antibody Screen: NEGATIVE

## 2021-12-30 LAB — SURGICAL PCR SCREEN
MRSA, PCR: NEGATIVE
Staphylococcus aureus: NEGATIVE
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
## 2021-12-30 NOTE — Progress Notes (Signed)
PCP - Dr. Dianne Dun Cardiologist - denies  PPM/ICD - denies   Chest x-ray - 11/08/21 EKG - 03/26/21 Stress Test - 04/29/20 ECHO - denies Cardiac Cath - denies  Sleep Study - denies   DM- denies  ASA/Blood Thinner Instructions: n/a   ERAS Protcol - yes PRE-SURGERY Ensure given at PAT  COVID TEST- n/a   Anesthesia review: no  Patient denies shortness of breath, fever, cough and chest pain at PAT appointment   All instructions explained to the patient, with a verbal understanding of the material. Patient agrees to go over the instructions while at home for a better understanding. The opportunity to ask questions was provided.

## 2022-01-11 ENCOUNTER — Encounter (HOSPITAL_COMMUNITY): Payer: Self-pay | Admitting: Orthopedic Surgery

## 2022-01-12 ENCOUNTER — Encounter (HOSPITAL_COMMUNITY): Admission: RE | Payer: Self-pay | Source: Home / Self Care

## 2022-01-12 ENCOUNTER — Inpatient Hospital Stay (HOSPITAL_COMMUNITY): Admission: RE | Admit: 2022-01-12 | Payer: 59 | Source: Home / Self Care | Admitting: Orthopedic Surgery

## 2022-01-12 SURGERY — ANTERIOR LATERAL LUMBAR FUSION 1 LEVEL
Anesthesia: General

## 2022-01-13 ENCOUNTER — Encounter (HOSPITAL_COMMUNITY): Admission: RE | Payer: Self-pay | Source: Ambulatory Visit

## 2022-01-13 ENCOUNTER — Inpatient Hospital Stay (HOSPITAL_COMMUNITY): Admission: RE | Admit: 2022-01-13 | Payer: 59 | Source: Ambulatory Visit | Admitting: Orthopedic Surgery

## 2022-01-13 SURGERY — POSTERIOR LUMBAR FUSION 2 LEVEL
Anesthesia: General

## 2022-01-18 ENCOUNTER — Other Ambulatory Visit: Payer: Self-pay | Admitting: Adult Health

## 2022-01-18 DIAGNOSIS — N926 Irregular menstruation, unspecified: Secondary | ICD-10-CM

## 2022-01-18 NOTE — Progress Notes (Signed)
Check TSH, FSH and QHCG

## 2022-01-19 LAB — BETA HCG QUANT (REF LAB): hCG Quant: <1 m[IU]/mL

## 2022-01-19 LAB — FOLLICLE STIMULATING HORMONE: FSH: 5.5 m[IU]/mL

## 2022-01-19 LAB — TSH: TSH: 1.71 u[IU]/mL (ref 0.450–4.500)

## 2022-01-26 ENCOUNTER — Ambulatory Visit (INDEPENDENT_AMBULATORY_CARE_PROVIDER_SITE_OTHER): Payer: 59

## 2022-01-26 ENCOUNTER — Ambulatory Visit (INDEPENDENT_AMBULATORY_CARE_PROVIDER_SITE_OTHER): Payer: 59 | Admitting: Podiatry

## 2022-01-26 DIAGNOSIS — M21611 Bunion of right foot: Secondary | ICD-10-CM

## 2022-01-26 DIAGNOSIS — M21612 Bunion of left foot: Secondary | ICD-10-CM | POA: Diagnosis not present

## 2022-01-26 DIAGNOSIS — M21619 Bunion of unspecified foot: Secondary | ICD-10-CM

## 2022-01-26 DIAGNOSIS — L600 Ingrowing nail: Secondary | ICD-10-CM

## 2022-01-26 NOTE — Progress Notes (Signed)
Subjective:   Patient ID: [employer], female   DOB: 41 y.o.   MRN: [id number]   HPI Patient presents stating the big toenails on both her feet have been bothering her and the medial border worse with scab tissue formation she has a bunion right which has become sore she is due to have back fusion and may require her bunion to be fixed.  Patient states they do bother is not smoking currently and would like to be active   Review of Systems  All other systems reviewed and are negative.       Objective:  Physical Exam Vitals and nursing note reviewed.  Constitutional:      Appearance: She is well-developed.  Pulmonary:     Effort: Pulmonary effort is normal.  Musculoskeletal:        General: Normal range of motion.  Skin:    General: Skin is warm.  Neurological:     Mental Status: She is alert.     Neurovascular status intact muscle strength adequate range of motion within normal limits with patient found to have incurvated medial borders of the hallux of both feet that are painful when pressed with slight redness noted no active drainage noted and crusted like scab tissue formation.  Also had bunion correction left done approximately 20 years ago and structural bunion deformity right with enlargement around the head.  Good digital perfusion well-oriented     Assessment:  Ingrown toenail deformity hallux bilateral along with structural bunion deformity right     Plan:  Patient P reviewed all conditions recommended nail correction explained procedure risk and patient wants surgery.  I went ahead today allowed her to sign consent form after review I infiltrated each hallux 60 mg like Marcaine mixture sterile prep done and using sterile instrumentation remove the medial borders exposed matrix applied phenol 3 applications 30 seconds followed by alcohol by sterile dressing gave instructions on soaks leave dressings on 24 hours but take them off earlier if throbbing were to occur  and encouraged her to call questions concerns.  Discussed the bunion I do think it could be fixed during the postoperative recovery.  From her back surgery but I want to first her to get that focused on and then structural correction can be done and I reviewed x-rays bilateral  X-rays indicate that there is structural bunion right elevation of the intermetatarsal angle pins intact left no signs of pathology

## 2022-01-26 NOTE — Patient Instructions (Signed)

## 2022-02-04 ENCOUNTER — Ambulatory Visit: Payer: 59

## 2022-02-10 ENCOUNTER — Other Ambulatory Visit: Payer: Self-pay | Admitting: Family

## 2022-02-10 ENCOUNTER — Other Ambulatory Visit (INDEPENDENT_AMBULATORY_CARE_PROVIDER_SITE_OTHER): Payer: Self-pay | Admitting: Internal Medicine

## 2022-02-11 ENCOUNTER — Ambulatory Visit (INDEPENDENT_AMBULATORY_CARE_PROVIDER_SITE_OTHER): Payer: 59

## 2022-02-11 DIAGNOSIS — J4550 Severe persistent asthma, uncomplicated: Secondary | ICD-10-CM | POA: Diagnosis not present

## 2022-02-16 ENCOUNTER — Ambulatory Visit (INDEPENDENT_AMBULATORY_CARE_PROVIDER_SITE_OTHER): Payer: 59 | Admitting: Podiatry

## 2022-02-16 ENCOUNTER — Ambulatory Visit (INDEPENDENT_AMBULATORY_CARE_PROVIDER_SITE_OTHER): Payer: 59

## 2022-02-16 DIAGNOSIS — M21612 Bunion of left foot: Secondary | ICD-10-CM

## 2022-02-16 DIAGNOSIS — M21619 Bunion of unspecified foot: Secondary | ICD-10-CM

## 2022-02-16 DIAGNOSIS — L600 Ingrowing nail: Secondary | ICD-10-CM | POA: Diagnosis not present

## 2022-02-16 DIAGNOSIS — M79672 Pain in left foot: Secondary | ICD-10-CM

## 2022-02-16 NOTE — Progress Notes (Signed)
Subjective:   Patient ID: [employer], female   DOB: 41 y.o.   MRN: [id number]   HPI Patient states she had her other toe sat on by her dog and its become inflamed and she is worried about infection and she wants to get her right bunion fixed after her back is fixed   ROS      Objective:  Physical Exam  Neuro vascular status intact with healing surgical sites hallux bilateral mild drainage no other pathology noted and significant structural bunion deformity right low-grade     Assessment:  Paronychia infection left hallux localized secondary to trauma with patient admitting she did not soak it after the procedure and structural bunion right that is painful and she knows needs to be corrected      Plan:  H&P reviewed all conditions.  I do think that bunion needs to be corrected and I discussed procedure risk and she would like to get it done but just needs to hold off and I discussed soaks for the big toe and if redness or drainage intensifies we will have to put on antibiotic.  She should at this point heal uneventfully  Precautionary x-rays taken today did not indicate bony pathology to the toe secondary to the trauma that the dog created

## 2022-02-22 ENCOUNTER — Other Ambulatory Visit: Payer: Self-pay | Admitting: Orthopedic Surgery

## 2022-02-25 ENCOUNTER — Other Ambulatory Visit: Payer: Self-pay

## 2022-03-01 ENCOUNTER — Other Ambulatory Visit: Payer: Self-pay | Admitting: Adult Health

## 2022-03-02 ENCOUNTER — Other Ambulatory Visit: Payer: Self-pay | Admitting: Podiatry

## 2022-03-02 DIAGNOSIS — M21612 Bunion of left foot: Secondary | ICD-10-CM

## 2022-03-02 NOTE — Pre-Procedure Instructions (Signed)
Surgical Instructions    Your procedure is scheduled on Wednesday, October 4th.  Report to [facility] Main Entrance "A" at 05:30 A.M., then check in with the Admitting office.  Call this number if you have problems the morning of surgery:  [phone]   If you have any questions prior to your surgery date call [phone]: Open Monday-Friday 8am-4pm    Remember:  Do not eat after midnight the night before your surgery  You may drink clear liquids until 05:30 AM the morning of your surgery.   Clear liquids allowed are: Water, Non-Citrus Juices (without pulp), Carbonated Beverages, Clear Tea, Black Coffee Only (NO MILK, CREAM OR POWDERED CREAMER of any kind), and Gatorade.   Patient Instructions  The night before surgery:  No food after midnight. ONLY clear liquids after midnight  The day of surgery (if you do NOT have diabetes):  Drink ONE (1) Pre-Surgery Clear Ensure by 05:30 AM the morning of surgery. Drink in one sitting. Do not sip.  This drink was given to you during your hospital  pre-op appointment visit.  Nothing else to drink after completing the  Pre-Surgery Clear Ensure.          If you have questions, please contact your surgeon's office.     Take these medicines the morning of surgery with A SIP OF WATER  cyclobenzaprine (FLEXERIL)  esomeprazole (NEXIUM)  lamoTRIgine (LAMICTAL)  metoprolol succinate (TOPROL-XL)  Oxycodone HCl  pregabalin (LYRICA)  RESTASIS 0.05 % ophthalmic emulsion   If needed: albuterol (PROVENTIL)  albuterol (VENTOLIN HFA)- if needed, bring with you on day of surgery alprazolam (XANAX)  budesonide-formoterol (SYMBICORT) ondansetron (ZOFRAN ODT)  SUMAtriptan (IMITREX)    Hold Semaglutide for 7 days prior to procedure.   As of today, STOP taking any Aspirin (unless otherwise instructed by your surgeon) Aleve, Naproxen, Ibuprofen, Motrin, Advil, Goody's, BC's, all herbal medications, fish oil, and all vitamins. This includes your  diclofenac (VOLTAREN) tablet and gel.                     Do NOT Smoke (Tobacco/Vaping) for 24 hours prior to your procedure.  If you use a CPAP at night, you may bring your mask/headgear for your overnight stay.   Contacts, glasses, piercing's, hearing aid's, dentures or partials may not be worn into surgery, please bring cases for these belongings.    For patients admitted to the hospital, discharge time will be determined by your treatment team.   Patients discharged the day of surgery will not be allowed to drive home, and someone needs to stay with them for 24 hours.  SURGICAL WAITING ROOM VISITATION Patients having surgery or a procedure may have no more than 2 support people in the waiting area - these visitors may rotate.   Children under the age of 57 must have an adult with them who is not the patient. If the patient needs to stay at the hospital during part of their recovery, the visitor guidelines for inpatient rooms apply. Pre-op nurse will coordinate an appropriate time for 1 support person to accompany patient in pre-op.  This support person may not rotate.   Please refer to the [facility] website for the visitor guidelines for Inpatients (after your surgery is over and you are in a regular room).    Special instructions:   [facility]- Preparing For Surgery  Before surgery, you can play an important role. Because skin is not sterile, your skin needs to be as free of  germs as possible. You can reduce the number of germs on your skin by washing with CHG (chlorahexidine gluconate) Soap before surgery.  CHG is an antiseptic cleaner which kills germs and bonds with the skin to continue killing germs even after washing.    Oral Hygiene is also important to reduce your risk of infection.  Remember - BRUSH YOUR TEETH THE MORNING OF SURGERY WITH YOUR REGULAR TOOTHPASTE  Please do not use if you have an allergy to CHG or antibacterial soaps. If your skin becomes reddened/irritated  stop using the CHG.  Do not shave (including legs and underarms) for at least 48 hours prior to first CHG shower. It is OK to shave your face.  Please follow these instructions carefully.   Shower the NIGHT BEFORE SURGERY and the MORNING OF SURGERY  If you chose to wash your hair, wash your hair first as usual with your normal shampoo.  After you shampoo, rinse your hair and body thoroughly to remove the shampoo.  Use CHG Soap as you would any other liquid soap. You can apply CHG directly to the skin and wash gently with a scrungie or a clean washcloth.   Apply the CHG Soap to your body ONLY FROM THE NECK DOWN.  Do not use on open wounds or open sores. Avoid contact with your eyes, ears, mouth and genitals (private parts). Wash Face and genitals (private parts)  with your normal soap.   Wash thoroughly, paying special attention to the area where your surgery will be performed.  Thoroughly rinse your body with warm water from the neck down.  DO NOT shower/wash with your normal soap after using and rinsing off the CHG Soap.  Pat yourself dry with a CLEAN TOWEL.  Wear CLEAN PAJAMAS to bed the night before surgery  Place CLEAN SHEETS on your bed the night before your surgery  DO NOT SLEEP WITH PETS.   Day of Surgery: Take a shower with CHG soap. Do not wear jewelry or makeup Do not wear lotions, powders, perfumes, or deodorant. Do not shave 48 hours prior to surgery.   Do not bring valuables to the hospital. [facility] is not responsible for any belongings or valuables. Do not wear nail polish, gel polish, artificial nails, or any other type of covering on natural nails (fingers and toes) If you have artificial nails or gel coating that need to be removed by a nail salon, please have this removed prior to surgery. Artificial nails or gel coating may interfere with anesthesia's ability to adequately monitor your vital signs. Wear Clean/Comfortable clothing the morning of  surgery Remember to brush your teeth WITH YOUR REGULAR TOOTHPASTE.   Please read over the following fact sheets that you were given.    If you received a COVID test during your pre-op visit  it is requested that you wear a mask when out in public, stay away from anyone that may not be feeling well and notify your surgeon if you develop symptoms. If you have been in contact with anyone that has tested positive in the last 10 days please notify you surgeon.

## 2022-03-03 ENCOUNTER — Encounter (HOSPITAL_COMMUNITY)
Admission: RE | Admit: 2022-03-03 | Discharge: 2022-03-03 | Disposition: A | Discharge status: Home / Self Care | Payer: 59 | Source: Ambulatory Visit | Attending: Orthopedic Surgery | Admitting: Orthopedic Surgery

## 2022-03-03 ENCOUNTER — Encounter (HOSPITAL_COMMUNITY): Payer: Self-pay

## 2022-03-03 ENCOUNTER — Other Ambulatory Visit: Payer: Self-pay

## 2022-03-03 VITALS — BP 113/89 | HR 65 | Temp 98.7°F | Resp 17 | Ht 66.0 in | Wt 185.6 lb

## 2022-03-03 DIAGNOSIS — I10 Essential (primary) hypertension: Secondary | ICD-10-CM | POA: Insufficient documentation

## 2022-03-03 DIAGNOSIS — Z01812 Encounter for preprocedural laboratory examination: Secondary | ICD-10-CM | POA: Diagnosis not present

## 2022-03-03 DIAGNOSIS — Z01818 Encounter for other preprocedural examination: Secondary | ICD-10-CM

## 2022-03-03 LAB — CBC
HCT: 37.5 % (ref 36.0–46.0)
Hemoglobin: 13.2 g/dL (ref 12.0–15.0)
MCH: 31.7 pg (ref 26.0–34.0)
MCHC: 35.2 g/dL (ref 30.0–36.0)
MCV: 89.9 fL (ref 80.0–100.0)
Platelets: 306 10*3/uL (ref 150–400)
RBC: 4.17 MIL/uL (ref 3.87–5.11)
RDW: 12.8 % (ref 11.5–15.5)
WBC: 6.4 10*3/uL (ref 4.0–10.5)
nRBC: 0 % (ref 0.0–0.2)

## 2022-03-03 LAB — BASIC METABOLIC PANEL
Anion gap: 11 (ref 5–15)
BUN: 10 mg/dL (ref 6–20)
CO2: 23 mmol/L (ref 22–32)
Calcium: 9.2 mg/dL (ref 8.9–10.3)
Chloride: 104 mmol/L (ref 98–111)
Creatinine, Ser: 0.92 mg/dL (ref 0.44–1.00)
GFR, Estimated: >60 mL/min (ref >60)
Glucose, Bld: 72 mg/dL (ref 70–99)
Potassium: 3.9 mmol/L (ref 3.5–5.1)
Sodium: 138 mmol/L (ref 135–145)

## 2022-03-03 LAB — SURGICAL PCR SCREEN
MRSA, PCR: NEGATIVE
Staphylococcus aureus: NEGATIVE

## 2022-03-03 LAB — TYPE AND SCREEN
ABO/RH(D): O POS
Antibody Screen: NEGATIVE

## 2022-03-03 NOTE — Progress Notes (Signed)
PCP - Dianne Dun Cardiologist - Denies  PPM/ICD - Denies Device Orders -  Rep Notified -   Chest x-ray - NI EKG - 03/26/21 Stress Test - 04/29/20 ECHO - Denies Cardiac Cath - Denies  Sleep Study - Denies OSA  DM - Pre diabetic Fasting Blood Sugar -  Checks Blood Sugar _____ times a day  Blood Thinner Instructions: Denies Aspirin Instructions:Denies  ERAS Protcol -Yes PRE-SURGERY Ensure given   COVID TEST- NI   Anesthesia review: No  Patient denies shortness of breath, fever, cough and chest pain at PAT appointment   All instructions explained to the patient, with a verbal understanding of the material. Patient agrees to go over the instructions while at home for a better understanding.  The opportunity to ask questions was provided.

## 2022-03-03 NOTE — Progress Notes (Signed)
Patient not comfortable signing consent for Dr. Carlis Abbott to assist with surgery.  She was under the impression Dr. Stanford Breed would be working with Dr. Lynann Bologna. She had met and spoke with Dr. Stanford Breed. She is following up with the surgeon's office, either [facility].

## 2022-03-08 ENCOUNTER — Ambulatory Visit (INDEPENDENT_AMBULATORY_CARE_PROVIDER_SITE_OTHER): Payer: 59 | Admitting: Vascular Surgery

## 2022-03-08 VITALS — BP 117/69 | HR 83 | Temp 98.0°F | Resp 16 | Ht 66.0 in | Wt 188.0 lb

## 2022-03-08 DIAGNOSIS — M5416 Radiculopathy, lumbar region: Secondary | ICD-10-CM

## 2022-03-08 NOTE — Anesthesia Preprocedure Evaluation (Signed)
Anesthesia Evaluation  Patient identified by MRN, date of birth, ID band Patient awake    Reviewed: Allergy & Precautions, NPO status , Patient's Chart, lab work & pertinent test results, reviewed documented beta blocker date and time   Airway Mallampati: I  TM Distance: >3 FB Neck ROM: Full    Dental  (+) Dental Advisory Given, Partial Upper, Partial Lower   Pulmonary asthma , former smoker   Pulmonary exam normal breath sounds clear to auscultation       Cardiovascular hypertension, Pt. on home beta blockers Normal cardiovascular exam Rhythm:Regular Rate:Normal     Neuro/Psych  Headaches PSYCHIATRIC DISORDERS Anxiety Depression Bipolar Disorder   L5-S1 spondylolisthesis with severe bilateral neuroforaminal stenosis. 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  Neuromuscular disease    GI/Hepatic Neg liver ROS,GERD  ,,  Endo/Other  Obesity   Renal/GU negative Renal ROS     Musculoskeletal  (+) Arthritis ,    Abdominal   Peds  (+) ADHD Hematology negative hematology ROS (+)   Anesthesia Other Findings   Reproductive/Obstetrics negative OB ROS                             Anesthesia Physical Anesthesia Plan  ASA: 3  Anesthesia Plan: General   Post-op Pain Management: Tylenol PO (pre-op)*   Induction: Intravenous  PONV Risk Score and Plan: 3 and Midazolam, Ondansetron, Scopolamine patch - Pre-op and Propofol infusion  Airway Management Planned: Oral ETT  Additional Equipment: Arterial line  Intra-op Plan:   Post-operative Plan: Extubation in OR  Informed Consent: I have reviewed the patients History and Physical, chart, labs and discussed the procedure including the risks, benefits and alternatives for the proposed anesthesia with the patient or authorized representative who has indicated his/her understanding and acceptance.     Dental advisory given  Plan Discussed with: CRNA  Anesthesia Plan  Comments: (2nd PIV)        Anesthesia Quick Evaluation
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
## 2022-03-09 ENCOUNTER — Inpatient Hospital Stay (HOSPITAL_COMMUNITY)
Admission: AD | Admit: 2022-03-09 | Discharge: 2022-03-11 | DRG: 454 | Disposition: A | Discharge status: Home / Self Care | Payer: 59 | Source: Ambulatory Visit | Attending: Orthopedic Surgery | Admitting: Orthopedic Surgery

## 2022-03-09 ENCOUNTER — Inpatient Hospital Stay (HOSPITAL_COMMUNITY)
Admission: AD | Disposition: A | Discharge status: Home / Self Care | Payer: Self-pay | Source: Ambulatory Visit | Attending: Orthopedic Surgery

## 2022-03-09 ENCOUNTER — Inpatient Hospital Stay (HOSPITAL_COMMUNITY): Payer: 59

## 2022-03-09 ENCOUNTER — Other Ambulatory Visit: Payer: Self-pay

## 2022-03-09 ENCOUNTER — Inpatient Hospital Stay (HOSPITAL_COMMUNITY): Payer: 59 | Admitting: Certified Registered Nurse Anesthetist

## 2022-03-09 ENCOUNTER — Encounter (HOSPITAL_COMMUNITY): Payer: Self-pay | Admitting: Orthopedic Surgery

## 2022-03-09 DIAGNOSIS — E669 Obesity, unspecified: Secondary | ICD-10-CM | POA: Diagnosis present

## 2022-03-09 DIAGNOSIS — M5410 Radiculopathy, site unspecified: Principal | ICD-10-CM

## 2022-03-09 DIAGNOSIS — Z833 Family history of diabetes mellitus: Secondary | ICD-10-CM | POA: Diagnosis not present

## 2022-03-09 DIAGNOSIS — J45909 Unspecified asthma, uncomplicated: Secondary | ICD-10-CM | POA: Diagnosis present

## 2022-03-09 DIAGNOSIS — Z79899 Other long term (current) drug therapy: Secondary | ICD-10-CM | POA: Diagnosis not present

## 2022-03-09 DIAGNOSIS — G8929 Other chronic pain: Secondary | ICD-10-CM | POA: Diagnosis present

## 2022-03-09 DIAGNOSIS — Z8349 Family history of other endocrine, nutritional and metabolic diseases: Secondary | ICD-10-CM

## 2022-03-09 DIAGNOSIS — Z87892 Personal history of anaphylaxis: Secondary | ICD-10-CM | POA: Diagnosis not present

## 2022-03-09 DIAGNOSIS — Z87891 Personal history of nicotine dependence: Secondary | ICD-10-CM | POA: Diagnosis not present

## 2022-03-09 DIAGNOSIS — M4317 Spondylolisthesis, lumbosacral region: Secondary | ICD-10-CM | POA: Diagnosis present

## 2022-03-09 DIAGNOSIS — M5116 Intervertebral disc disorders with radiculopathy, lumbar region: Secondary | ICD-10-CM | POA: Diagnosis present

## 2022-03-09 DIAGNOSIS — Z7985 Long-term (current) use of injectable non-insulin antidiabetic drugs: Secondary | ICD-10-CM | POA: Diagnosis not present

## 2022-03-09 DIAGNOSIS — Z83438 Family history of other disorder of lipoprotein metabolism and other lipidemia: Secondary | ICD-10-CM | POA: Diagnosis not present

## 2022-03-09 DIAGNOSIS — Z8249 Family history of ischemic heart disease and other diseases of the circulatory system: Secondary | ICD-10-CM | POA: Diagnosis not present

## 2022-03-09 DIAGNOSIS — F909 Attention-deficit hyperactivity disorder, unspecified type: Secondary | ICD-10-CM | POA: Diagnosis present

## 2022-03-09 DIAGNOSIS — M960 Pseudarthrosis after fusion or arthrodesis: Secondary | ICD-10-CM | POA: Diagnosis present

## 2022-03-09 DIAGNOSIS — Y792 Prosthetic and other implants, materials and accessory orthopedic devices associated with adverse incidents: Secondary | ICD-10-CM | POA: Diagnosis not present

## 2022-03-09 DIAGNOSIS — I10 Essential (primary) hypertension: Secondary | ICD-10-CM | POA: Diagnosis present

## 2022-03-09 DIAGNOSIS — F418 Other specified anxiety disorders: Secondary | ICD-10-CM

## 2022-03-09 DIAGNOSIS — M4807 Spinal stenosis, lumbosacral region: Secondary | ICD-10-CM

## 2022-03-09 DIAGNOSIS — F32A Depression, unspecified: Secondary | ICD-10-CM | POA: Diagnosis present

## 2022-03-09 DIAGNOSIS — F419 Anxiety disorder, unspecified: Secondary | ICD-10-CM | POA: Diagnosis present

## 2022-03-09 DIAGNOSIS — M5117 Intervertebral disc disorders with radiculopathy, lumbosacral region: Secondary | ICD-10-CM | POA: Diagnosis present

## 2022-03-09 DIAGNOSIS — Z888 Allergy status to other drugs, medicaments and biological substances status: Secondary | ICD-10-CM

## 2022-03-09 DIAGNOSIS — Z91048 Other nonmedicinal substance allergy status: Secondary | ICD-10-CM

## 2022-03-09 DIAGNOSIS — Z885 Allergy status to narcotic agent status: Secondary | ICD-10-CM

## 2022-03-09 DIAGNOSIS — T84226A Displacement of internal fixation device of vertebrae, initial encounter: Secondary | ICD-10-CM | POA: Diagnosis not present

## 2022-03-09 DIAGNOSIS — Z6830 Body mass index (BMI) 30.0-30.9, adult: Secondary | ICD-10-CM | POA: Diagnosis not present

## 2022-03-09 DIAGNOSIS — M4307 Spondylolysis, lumbosacral region: Secondary | ICD-10-CM | POA: Diagnosis not present

## 2022-03-09 DIAGNOSIS — K219 Gastro-esophageal reflux disease without esophagitis: Secondary | ICD-10-CM | POA: Diagnosis present

## 2022-03-09 DIAGNOSIS — Z79891 Long term (current) use of opiate analgesic: Secondary | ICD-10-CM

## 2022-03-09 DIAGNOSIS — Z91040 Latex allergy status: Secondary | ICD-10-CM

## 2022-03-09 DIAGNOSIS — M5416 Radiculopathy, lumbar region: Secondary | ICD-10-CM | POA: Diagnosis not present

## 2022-03-09 DIAGNOSIS — Z91030 Bee allergy status: Secondary | ICD-10-CM

## 2022-03-09 HISTORY — PX: ANTERIOR LAT LUMBAR FUSION: SHX1168

## 2022-03-09 HISTORY — PX: ABDOMINAL EXPOSURE: SHX5708

## 2022-03-09 LAB — GLUCOSE, CAPILLARY: Glucose-Capillary: 92 mg/dL (ref 70–99)

## 2022-03-09 LAB — POCT PREGNANCY, URINE: Preg Test, Ur: NEGATIVE

## 2022-03-09 SURGERY — ANTERIOR LATERAL LUMBAR FUSION 1 LEVEL
Anesthesia: General

## 2022-03-09 MED ORDER — ALPRAZOLAM 0.5 MG PO TABS: 1.0000 mg | ORAL_TABLET | Freq: Two times a day (BID) | ORAL | Status: DC | PRN

## 2022-03-09 MED ORDER — ALUM & MAG HYDROXIDE-SIMETH 200-200-20 MG/5ML PO SUSP: 30.0000 mL | Freq: Four times a day (QID) | ORAL | Status: DC | PRN

## 2022-03-09 MED ORDER — POVIDONE-IODINE 7.5 % EX SOLN
Freq: Once | CUTANEOUS | Status: DC
  Filled 2022-03-09: qty 118

## 2022-03-09 MED ORDER — PHENOL 1.4 % MT LIQD: 1.0000 | OROMUCOSAL | Status: DC | PRN

## 2022-03-09 MED ORDER — MIDAZOLAM HCL 2 MG/2ML IJ SOLN
INTRAMUSCULAR | Status: AC
  Filled 2022-03-09: qty 2

## 2022-03-09 MED ORDER — SUGAMMADEX SODIUM 200 MG/2ML IV SOLN
INTRAVENOUS | Status: DC | PRN
  Administered 2022-03-09: 200 mg via INTRAVENOUS

## 2022-03-09 MED ORDER — OXYCODONE HCL 5 MG PO TABS
10.0000 mg | ORAL_TABLET | Freq: Every day | ORAL | Status: DC
  Administered 2022-03-09 – 2022-03-11 (×10): 10 mg via ORAL
  Filled 2022-03-09 (×10): qty 2

## 2022-03-09 MED ORDER — OXYCODONE HCL 5 MG PO TABS: 10.0000 mg | ORAL_TABLET | Freq: Every day | ORAL | Status: DC

## 2022-03-09 MED ORDER — CHLORHEXIDINE GLUCONATE CLOTH 2 % EX PADS: 6.0000 | MEDICATED_PAD | Freq: Once | CUTANEOUS | Status: DC

## 2022-03-09 MED ORDER — METOPROLOL SUCCINATE ER 100 MG PO TB24
100.0000 mg | ORAL_TABLET | Freq: Every day | ORAL | Status: DC
  Administered 2022-03-10: 100 mg via ORAL
  Filled 2022-03-09: qty 1

## 2022-03-09 MED ORDER — LORATADINE 10 MG PO TABS
10.0000 mg | ORAL_TABLET | Freq: Every evening | ORAL | Status: DC
  Administered 2022-03-09 – 2022-03-10 (×2): 10 mg via ORAL
  Filled 2022-03-09 (×2): qty 1

## 2022-03-09 MED ORDER — PANTOPRAZOLE SODIUM 40 MG PO TBEC
40.0000 mg | DELAYED_RELEASE_TABLET | Freq: Every day | ORAL | Status: DC
  Administered 2022-03-10: 40 mg via ORAL
  Filled 2022-03-09: qty 1

## 2022-03-09 MED ORDER — ACETAMINOPHEN 500 MG PO TABS
1000.0000 mg | ORAL_TABLET | Freq: Once | ORAL | Status: AC
  Administered 2022-03-09: 1000 mg via ORAL
  Filled 2022-03-09: qty 2

## 2022-03-09 MED ORDER — ACETAMINOPHEN 650 MG RE SUPP: 650.0000 mg | RECTAL | Status: DC | PRN

## 2022-03-09 MED ORDER — MIDAZOLAM HCL 2 MG/2ML IJ SOLN
INTRAMUSCULAR | Status: DC | PRN
  Administered 2022-03-09: 2 mg via INTRAVENOUS

## 2022-03-09 MED ORDER — MENTHOL 3 MG MT LOZG: 1.0000 | LOZENGE | OROMUCOSAL | Status: DC | PRN

## 2022-03-09 MED ORDER — SUMATRIPTAN SUCCINATE 50 MG PO TABS: 50.0000 mg | ORAL_TABLET | ORAL | Status: DC | PRN

## 2022-03-09 MED ORDER — PROMETHAZINE HCL 25 MG/ML IJ SOLN: 6.2500 mg | INTRAMUSCULAR | Status: DC | PRN

## 2022-03-09 MED ORDER — FLEET ENEMA 7-19 GM/118ML RE ENEM
1.0000 | ENEMA | Freq: Once | RECTAL | Status: AC | PRN
  Administered 2022-03-10: 1 via RECTAL
  Filled 2022-03-09: qty 1

## 2022-03-09 MED ORDER — FENTANYL CITRATE (PF) 250 MCG/5ML IJ SOLN
INTRAMUSCULAR | Status: AC
  Filled 2022-03-09: qty 5

## 2022-03-09 MED ORDER — POTASSIUM CHLORIDE IN NACL 20-0.9 MEQ/L-% IV SOLN: INTRAVENOUS | Status: DC

## 2022-03-09 MED ORDER — ALBUTEROL SULFATE HFA 108 (90 BASE) MCG/ACT IN AERS: 1.0000 | INHALATION_SPRAY | Freq: Four times a day (QID) | RESPIRATORY_TRACT | Status: DC | PRN

## 2022-03-09 MED ORDER — PROPOFOL 10 MG/ML IV BOLUS
INTRAVENOUS | Status: DC | PRN
  Administered 2022-03-09: 170 mg via INTRAVENOUS
  Administered 2022-03-09: 30 mg via INTRAVENOUS

## 2022-03-09 MED ORDER — EPINEPHRINE 0.3 MG/0.3ML IJ SOAJ: 0.3000 mg | INTRAMUSCULAR | Status: DC | PRN

## 2022-03-09 MED ORDER — NYSTATIN 100000 UNIT/GM EX POWD: 1.0000 | Freq: Three times a day (TID) | CUTANEOUS | Status: DC | PRN

## 2022-03-09 MED ORDER — SODIUM CHLORIDE 0.9 % IV SOLN: 250.0000 mL | INTRAVENOUS | Status: DC

## 2022-03-09 MED ORDER — ONDANSETRON HCL 4 MG/2ML IJ SOLN
INTRAMUSCULAR | Status: DC | PRN
  Administered 2022-03-09: 4 mg via INTRAVENOUS

## 2022-03-09 MED ORDER — DEXMEDETOMIDINE HCL IN NACL 400 MCG/100ML IV SOLN
INTRAVENOUS | Status: DC | PRN
  Administered 2022-03-09: 20 ug via INTRAVENOUS

## 2022-03-09 MED ORDER — CEFAZOLIN SODIUM-DEXTROSE 2-4 GM/100ML-% IV SOLN
2.0000 g | Freq: Three times a day (TID) | INTRAVENOUS | Status: AC
  Administered 2022-03-09 (×2): 2 g via INTRAVENOUS
  Filled 2022-03-09 (×2): qty 100

## 2022-03-09 MED ORDER — THROMBIN 20000 UNITS EX SOLR
CUTANEOUS | Status: DC | PRN
  Administered 2022-03-09: 20 mL

## 2022-03-09 MED ORDER — CEFAZOLIN SODIUM-DEXTROSE 2-4 GM/100ML-% IV SOLN
2.0000 g | INTRAVENOUS | Status: AC
  Administered 2022-03-09: 2 g via INTRAVENOUS
  Filled 2022-03-09: qty 100

## 2022-03-09 MED ORDER — PHENYLEPHRINE HCL-NACL 20-0.9 MG/250ML-% IV SOLN
INTRAVENOUS | Status: DC | PRN
  Administered 2022-03-09: 80 ug/min via INTRAVENOUS

## 2022-03-09 MED ORDER — SODIUM CHLORIDE 0.9% FLUSH: 3.0000 mL | INTRAVENOUS | Status: DC | PRN

## 2022-03-09 MED ORDER — FENTANYL CITRATE (PF) 100 MCG/2ML IJ SOLN
INTRAMUSCULAR | Status: AC
  Filled 2022-03-09: qty 2

## 2022-03-09 MED ORDER — KETAMINE HCL 10 MG/ML IJ SOLN
INTRAMUSCULAR | Status: DC | PRN
  Administered 2022-03-09: 30 mg via INTRAVENOUS

## 2022-03-09 MED ORDER — ORAL CARE MOUTH RINSE: 15.0000 mL | Freq: Once | OROMUCOSAL | Status: AC

## 2022-03-09 MED ORDER — ZOLPIDEM TARTRATE 5 MG PO TABS: 5.0000 mg | ORAL_TABLET | Freq: Every evening | ORAL | Status: DC | PRN

## 2022-03-09 MED ORDER — MORPHINE SULFATE (PF) 2 MG/ML IV SOLN
INTRAVENOUS | Status: AC
  Filled 2022-03-09: qty 1

## 2022-03-09 MED ORDER — PREGABALIN 75 MG PO CAPS
150.0000 mg | ORAL_CAPSULE | Freq: Two times a day (BID) | ORAL | Status: DC
  Administered 2022-03-09 – 2022-03-10 (×3): 150 mg via ORAL
  Filled 2022-03-09 (×3): qty 2

## 2022-03-09 MED ORDER — SODIUM CHLORIDE 0.9% FLUSH
3.0000 mL | Freq: Two times a day (BID) | INTRAVENOUS | Status: DC
  Administered 2022-03-09 – 2022-03-10 (×2): 3 mL via INTRAVENOUS

## 2022-03-09 MED ORDER — BUPIVACAINE-EPINEPHRINE 0.25% -1:200000 IJ SOLN: INTRAMUSCULAR | Status: DC | PRN

## 2022-03-09 MED ORDER — ROCURONIUM BROMIDE 10 MG/ML (PF) SYRINGE
PREFILLED_SYRINGE | INTRAVENOUS | Status: DC | PRN
  Administered 2022-03-09: 70 mg via INTRAVENOUS

## 2022-03-09 MED ORDER — PLECANATIDE 3 MG PO TABS: 3.0000 mg | ORAL_TABLET | Freq: Every day | ORAL | Status: DC

## 2022-03-09 MED ORDER — CHLORHEXIDINE GLUCONATE 0.12 % MT SOLN
15.0000 mL | Freq: Once | OROMUCOSAL | Status: AC
  Administered 2022-03-09: 15 mL via OROMUCOSAL
  Filled 2022-03-09: qty 15

## 2022-03-09 MED ORDER — BISACODYL 5 MG PO TBEC
5.0000 mg | DELAYED_RELEASE_TABLET | Freq: Every day | ORAL | Status: DC | PRN
  Administered 2022-03-09 – 2022-03-10 (×2): 5 mg via ORAL
  Filled 2022-03-09 (×2): qty 1

## 2022-03-09 MED ORDER — ACETAMINOPHEN 325 MG PO TABS: 650.0000 mg | ORAL_TABLET | ORAL | Status: DC | PRN

## 2022-03-09 MED ORDER — KETAMINE HCL 50 MG/5ML IJ SOSY
PREFILLED_SYRINGE | INTRAMUSCULAR | Status: AC
  Filled 2022-03-09: qty 5

## 2022-03-09 MED ORDER — MORPHINE SULFATE (PF) 2 MG/ML IV SOLN
1.0000 mg | INTRAVENOUS | Status: DC | PRN
  Administered 2022-03-09 – 2022-03-10 (×2): 2 mg via INTRAVENOUS
  Filled 2022-03-09: qty 1

## 2022-03-09 MED ORDER — ALBUTEROL SULFATE (2.5 MG/3ML) 0.083% IN NEBU: 2.5000 mg | INHALATION_SOLUTION | RESPIRATORY_TRACT | Status: DC | PRN

## 2022-03-09 MED ORDER — LACTATED RINGERS IV SOLN: INTRAVENOUS | Status: DC

## 2022-03-09 MED ORDER — FENTANYL CITRATE (PF) 250 MCG/5ML IJ SOLN
INTRAMUSCULAR | Status: DC | PRN
  Administered 2022-03-09: 100 ug via INTRAVENOUS
  Administered 2022-03-09: 150 ug via INTRAVENOUS

## 2022-03-09 MED ORDER — DOCUSATE SODIUM 100 MG PO CAPS
100.0000 mg | ORAL_CAPSULE | Freq: Two times a day (BID) | ORAL | Status: DC
  Administered 2022-03-09 – 2022-03-10 (×3): 100 mg via ORAL
  Filled 2022-03-09 (×3): qty 1

## 2022-03-09 MED ORDER — CYCLOBENZAPRINE HCL 10 MG PO TABS
10.0000 mg | ORAL_TABLET | Freq: Three times a day (TID) | ORAL | Status: DC
  Administered 2022-03-09 – 2022-03-10 (×5): 10 mg via ORAL
  Filled 2022-03-09 (×5): qty 1

## 2022-03-09 MED ORDER — LAMOTRIGINE 100 MG PO TABS
200.0000 mg | ORAL_TABLET | Freq: Every day | ORAL | Status: DC
  Administered 2022-03-10: 200 mg via ORAL
  Filled 2022-03-09 (×2): qty 2

## 2022-03-09 MED ORDER — ONDANSETRON 4 MG PO TBDP: 4.0000 mg | ORAL_TABLET | Freq: Three times a day (TID) | ORAL | Status: DC | PRN

## 2022-03-09 MED ORDER — CYCLOSPORINE 0.05 % OP EMUL
1.0000 [drp] | Freq: Two times a day (BID) | OPHTHALMIC | Status: DC
  Administered 2022-03-09 – 2022-03-10 (×3): 1 [drp] via OPHTHALMIC
  Filled 2022-03-09 (×4): qty 30

## 2022-03-09 MED ORDER — BUPIVACAINE-EPINEPHRINE (PF) 0.25% -1:200000 IJ SOLN
INTRAMUSCULAR | Status: AC
  Filled 2022-03-09: qty 30

## 2022-03-09 MED ORDER — PROPOFOL 10 MG/ML IV BOLUS
INTRAVENOUS | Status: AC
  Filled 2022-03-09: qty 20

## 2022-03-09 MED ORDER — MONTELUKAST SODIUM 10 MG PO TABS
10.0000 mg | ORAL_TABLET | Freq: Every day | ORAL | Status: DC
  Administered 2022-03-09 – 2022-03-10 (×2): 10 mg via ORAL
  Filled 2022-03-09 (×2): qty 1

## 2022-03-09 MED ORDER — CEFAZOLIN SODIUM-DEXTROSE 2-4 GM/100ML-% IV SOLN
INTRAVENOUS | Status: AC
  Filled 2022-03-09: qty 100

## 2022-03-09 MED ORDER — THROMBIN (RECOMBINANT) 20000 UNITS EX SOLR
CUTANEOUS | Status: AC
  Filled 2022-03-09: qty 20000

## 2022-03-09 MED ORDER — HEPARIN 6000 UNIT IRRIGATION SOLUTION
Status: AC
  Filled 2022-03-09: qty 500

## 2022-03-09 MED ORDER — FENTANYL CITRATE (PF) 100 MCG/2ML IJ SOLN
25.0000 ug | INTRAMUSCULAR | Status: DC | PRN
  Administered 2022-03-09 (×2): 50 ug via INTRAVENOUS

## 2022-03-09 MED ORDER — ALBUMIN HUMAN 5 % IV SOLN: INTRAVENOUS | Status: DC | PRN

## 2022-03-09 MED ORDER — OXYCODONE HCL ER 15 MG PO T12A
15.0000 mg | EXTENDED_RELEASE_TABLET | Freq: Two times a day (BID) | ORAL | Status: DC
  Administered 2022-03-09 – 2022-03-10 (×3): 15 mg via ORAL
  Filled 2022-03-09 (×3): qty 1

## 2022-03-09 MED ORDER — 0.9 % SODIUM CHLORIDE (POUR BTL) OPTIME
TOPICAL | Status: DC | PRN
  Administered 2022-03-09: 2000 mL

## 2022-03-09 MED ORDER — MOMETASONE FURO-FORMOTEROL FUM 200-5 MCG/ACT IN AERO
2.0000 | INHALATION_SPRAY | Freq: Two times a day (BID) | RESPIRATORY_TRACT | Status: DC
  Administered 2022-03-09 – 2022-03-10 (×3): 2 via RESPIRATORY_TRACT
  Filled 2022-03-09: qty 8.8

## 2022-03-09 SURGICAL SUPPLY — 87 items
AGENT HMST KT MTR STRL THRMB (HEMOSTASIS)
APL SKNCLS STERI-STRIP NONHPOA (GAUZE/BANDAGES/DRESSINGS) ×1
APPLIER CLIP 11 MED OPEN (CLIP) ×1
APR CLP MED 11 20 MLT OPN (CLIP) ×1
BAG COUNTER SPONGE SURGICOUNT (BAG) ×2 IMPLANT
BAG SPNG CNTER NS LX DISP (BAG) ×2
BENZOIN TINCTURE PRP APPL 2/3 (GAUZE/BANDAGES/DRESSINGS) IMPLANT
BLADE CLIPPER SURG (BLADE) IMPLANT
BLADE SURG 10 STRL SS (BLADE) ×1 IMPLANT
CLIP APPLIE 11 MED OPEN (CLIP) ×1 IMPLANT
CLIP LIGATING EXTRA MED SLVR (CLIP) IMPLANT
COVER BACK TABLE 80X110 HD (DRAPES) ×1 IMPLANT
COVER SURGICAL LIGHT HANDLE (MISCELLANEOUS) ×1 IMPLANT
DRAPE C-ARM 42X72 X-RAY (DRAPES) ×1 IMPLANT
DRAPE C-ARMOR (DRAPES) ×1 IMPLANT
DRAPE POUCH INSTRU U-SHP 10X18 (DRAPES) ×1 IMPLANT
DRAPE SURG 17X23 STRL (DRAPES) ×4 IMPLANT
DRSG AQUACEL AG ADV 3.5X10 (GAUZE/BANDAGES/DRESSINGS) IMPLANT
DRSG OPSITE POSTOP 4X8 (GAUZE/BANDAGES/DRESSINGS) IMPLANT
DURAPREP 26ML APPLICATOR (WOUND CARE) ×1 IMPLANT
ELECT BLADE 4.0 EZ CLEAN MEGAD (MISCELLANEOUS) ×1
ELECT BLADE 6.5 EXT (BLADE) ×1 IMPLANT
ELECT CAUTERY BLADE 6.4 (BLADE) ×1 IMPLANT
ELECT REM PT RETURN 9FT ADLT (ELECTROSURGICAL) ×1
ELECTRODE BLDE 4.0 EZ CLN MEGD (MISCELLANEOUS) ×1 IMPLANT
ELECTRODE REM PT RTRN 9FT ADLT (ELECTROSURGICAL) ×1 IMPLANT
GAUZE 4X4 16PLY ~~LOC~~+RFID DBL (SPONGE) ×1 IMPLANT
GAUZE SPONGE 4X4 12PLY STRL (GAUZE/BANDAGES/DRESSINGS) IMPLANT
GLOVE BIO SURGEON STRL SZ7 (GLOVE) ×2 IMPLANT
GLOVE BIO SURGEON STRL SZ7.5 (GLOVE) ×1 IMPLANT
GLOVE BIO SURGEON STRL SZ8 (GLOVE) ×1 IMPLANT
GLOVE BIOGEL PI IND STRL 7.0 (GLOVE) ×1 IMPLANT
GLOVE BIOGEL PI IND STRL 8 (GLOVE) ×2 IMPLANT
GLOVE SURG ENC MOIS LTX SZ6.5 (GLOVE) ×2 IMPLANT
GOWN STRL REUS W/ TWL LRG LVL3 (GOWN DISPOSABLE) ×1 IMPLANT
GOWN STRL REUS W/ TWL XL LVL3 (GOWN DISPOSABLE) ×3 IMPLANT
GOWN STRL REUS W/TWL LRG LVL3 (GOWN DISPOSABLE) ×1
GOWN STRL REUS W/TWL XL LVL3 (GOWN DISPOSABLE) ×3
INSERT FOGARTY 61MM (MISCELLANEOUS) IMPLANT
INSERT FOGARTY SM (MISCELLANEOUS) IMPLANT
IV CATH 14GX2 1/4 (CATHETERS) ×1 IMPLANT
KIT BASIN OR (CUSTOM PROCEDURE TRAY) ×1 IMPLANT
KIT TURNOVER KIT B (KITS) ×1 IMPLANT
MARKER SKIN DUAL TIP RULER LAB (MISCELLANEOUS) ×1 IMPLANT
NDL HYPO 25GX1X1/2 BEV (NEEDLE) ×1 IMPLANT
NDL SPNL 18GX3.5 QUINCKE PK (NEEDLE) ×1 IMPLANT
NEEDLE HYPO 25GX1X1/2 BEV (NEEDLE) ×1 IMPLANT
NEEDLE SPNL 18GX3.5 QUINCKE PK (NEEDLE) ×1 IMPLANT
NS IRRIG 1000ML POUR BTL (IV SOLUTION) ×2 IMPLANT
PACK LAMINECTOMY ORTHO (CUSTOM PROCEDURE TRAY) ×1 IMPLANT
PACK UNIVERSAL I (CUSTOM PROCEDURE TRAY) ×1 IMPLANT
PAD ARMBOARD 7.5X6 YLW CONV (MISCELLANEOUS) ×4 IMPLANT
PUTTY BONE DBX 2.5 MIS (Bone Implant) IMPLANT
PUTTY BONE DBX 5CC MIX (Putty) IMPLANT
SPACER LORD ALIF 34X12X26 15D (Spacer) IMPLANT
SPONGE INTESTINAL PEANUT (DISPOSABLE) ×4 IMPLANT
SPONGE SURGIFOAM ABS GEL 100 (HEMOSTASIS) IMPLANT
SPONGE T-LAP 18X18 ~~LOC~~+RFID (SPONGE) ×1 IMPLANT
SPONGE T-LAP 4X18 ~~LOC~~+RFID (SPONGE) ×1 IMPLANT
STAPLER VISISTAT 35W (STAPLE) ×1 IMPLANT
STRIP CLOSURE SKIN 1/2X4 (GAUZE/BANDAGES/DRESSINGS) IMPLANT
SURGIFLO W/THROMBIN 8M KIT (HEMOSTASIS) IMPLANT
SUT MNCRL AB 4-0 PS2 18 (SUTURE) ×1 IMPLANT
SUT PDS AB 1 CT1 36 (SUTURE) IMPLANT
SUT PROLENE 4 0 RB 1 (SUTURE)
SUT PROLENE 4-0 RB1 .5 CRCL 36 (SUTURE) IMPLANT
SUT PROLENE 5 0 CC1 (SUTURE) IMPLANT
SUT PROLENE 6 0 C 1 30 (SUTURE) IMPLANT
SUT PROLENE 6 0 CC (SUTURE) IMPLANT
SUT SILK 0 TIES 10X30 (SUTURE) IMPLANT
SUT SILK 2 0 TIES 10X30 (SUTURE) ×1 IMPLANT
SUT SILK 2 0SH CR/8 30 (SUTURE) IMPLANT
SUT SILK 3 0 TIES 17X18 (SUTURE)
SUT SILK 3 0SH CR/8 30 (SUTURE) IMPLANT
SUT SILK 3-0 18XBRD TIE BLK (SUTURE) IMPLANT
SUT VIC AB 0 CT1 18XCR BRD 8 (SUTURE) ×1 IMPLANT
SUT VIC AB 0 CT1 8-18 (SUTURE) ×1
SUT VIC AB 1 CT1 18XCR BRD 8 (SUTURE) ×1 IMPLANT
SUT VIC AB 1 CT1 8-18 (SUTURE) ×1
SUT VIC AB 2-0 CT2 18 VCP726D (SUTURE) ×1 IMPLANT
SYR BULB IRRIG 60ML STRL (SYRINGE) ×1 IMPLANT
SYR CONTROL 10ML LL (SYRINGE) ×1 IMPLANT
TOWEL GREEN STERILE (TOWEL DISPOSABLE) ×2 IMPLANT
TOWEL GREEN STERILE FF (TOWEL DISPOSABLE) ×1 IMPLANT
TRAY FOLEY MTR SLVR 16FR STAT (SET/KITS/TRAYS/PACK) ×1 IMPLANT
WATER STERILE IRR 1000ML POUR (IV SOLUTION) ×1 IMPLANT
YANKAUER SUCT BULB TIP NO VENT (SUCTIONS) ×1 IMPLANT
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
## 2022-03-09 NOTE — Op Note (Addendum)
Date: March 09, 2022  Preoperative diagnosis: Chronic lower back pain  Postoperative diagnosis: Same  Procedure: Anterior spine exposure at the L5-S1 disc space via anterior retroperitoneal approach for L5-S1 ALIF  Surgeon: Dr. Marty Heck, MD  Co-surgeon: Dr. Phylliss Bob, MD  Assistant: Karn Pickler, PA  Indications: 41 year old female with chronic lower back pain that has been evaluated by Dr. Lynann Bologna after failing conservative management.  She has had previous posterior lumbar spinal surgery.  Vascular surgery was asked to assist with anterior spine exposure for L5-S1 ALIF.  She presents today after risks benefits discussed.  Findings: The L5-S1 disc space was marked over the left rectus muscle with a fluoroscopic C arm.  Transverse incision was then made over the left rectus muscle and mobilized the left rectus muscle to the midline after opening the anterior rectus sheath.  Entered the retroperitoneum and mobilized peritoneum and left ureter to the midline.  The middle sacral vessels were divided between clips.  Mobilized on both sides of the disc space including mobilizing the left iliac vein lateral.  Fixed NuVasive retractors were placed.  We confirmed we were at the correct level with a spinal needle in the disc space on lateral fluoroscopy.  Anesthesia: General  Details: Patient was taken to the operating room after informed consent was obtained.  Placed on operative table in the supine position.  Fluoroscopic C-arm was used in lateral position to mark the L5-S1 disc space over the left rectus muscle.  The abdominal wall was then prepped and draped in standard sterile fashion.  Antibiotics were given.  Timeout performed.  Then made our transverse incision over the left rectus muscle at the preoperative mark.  Dissected through the subcutaneous tissue with Bovie cautery and used cerebellar retractors.  The anterior rectus sheath was opened transversely.  I then mobilized  the left rectus muscle to the midline.  I entered into the retroperitoneum lateral to the muscle mobilized peritoneum and left ureter to the midline with blunt dissection.  The left psoas as well as left iliac vessels were visualized.  My assistant then used hand-held Wiley retractors to pull the peritoneum and left ureter to the midline and I continued to mobilize to the disc space at L5-S1 with blunt dissection.  The middle sacral vessels were identified and divided between clips.  I then mobilized bluntly with suction and Kd on both sides of the space including mobilizing left iliac vein lateral.  Once I had good working room here we placed a fixed NuVasive retractor on the field.  A 160 reverse lip retractors were placed on each side of the disc space.  I placed a 140 reverse lip retractor cranial and a 180 reverse lip retractor caudal.  We had good exposure.  I put a spinal needle in the disc space and confirmed we were at the correct L5-S1 level.  Case was turned over to Dr. Katherina Right.  Complication: None  Condition: Stable  Marty Heck, MD [facility] Office: [place]

## 2022-03-09 NOTE — Progress Notes (Signed)
Pt c/o pain with the Foley. Pt requested to have it removed. Foley removed. Holli Humbles, RN

## 2022-03-09 NOTE — Op Note (Addendum)
PATIENT NAME: [facility] RECORD NO.:   [id number]    DATE OF BIRTH: January 01, 1981   DATE OF PROCEDURE: 03/09/2022                              OPERATIVE REPORT   PREOPERATIVE DIAGNOSES: 1. Bilateral lumbar radiculopathy. 2. L5-S1 degenerative disk disease. 3. L5/S1 spondylolisthesis 4. L4/5 nonunion  POSTOPERATIVE DIAGNOSES: 1. Bilateral lumbar radiculopathy. 2. L5-S1 degenerative disk disease. 3. L5/S1 spondylolisthesis 4. 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 Anterior lumbar interbody fusion, L5-S1 (expsure peformed by Dr. Fortunato Curling) 2. Insertion of interbody device x1 (Globus expandable intervertebral spacer, medium, 15 degree). 3. Intraoperative use of fluoroscopy. 4. Use of morselized allograft - DBX-mix  SURGEON:  Phylliss Bob, MD  ASSISTANT:  Pricilla Holm, PA-C  ANESTHESIA:  General endotracheal anesthesia.  COMPLICATIONS:  None.  DISPOSITION:  Stable.  ESTIMATED BLOOD LOSS:  minimal  INDICATIONS FOR SURGERY:  Briefly, Ms. Feltman is a very pleasant 41 year old female, who has been having progressive debilitating pain in the bilateral legs.  The patient did fail appropriate nonoperative measures, but did continue to have significant pain.  Given her ongoing pain and dysfunction, we did discuss proceeding with the procedure noted above.  The patient was fully aware of the risks and limitations associated with surgery, and did wish to proceed.     OPERATIVE DETAILS:  On 03/09/2022, the patient was brought to surgery and general endotracheal anesthesia was administered.  The patient was placed supine on the hospital bed.  The patient's abdomen was prepped and draped in the usual sterile fashion.  An anterior retroperitoneal approach was then performed by Dr. Fortunato Curling.  Once the anterior lumbar spine was noted, we did focus our attention on the L5-S1 intervertebral space.  I then performed a thorough and complete L5-S1 intervertebral  diskectomy to the level of the posterior longitudinal ligament. I was very pleased with the diskectomy that I was able to accomplish. The endplates were then appropriately prepared and the appropriate sized anterior intervertebral spacer was packed with DBX-mix  and tamped into position. At this point, the spacer was expanded to approximately 14.5 mm in height. I was very pleased with the press-fit of the implant, and the increased foraminal height bilaterally.  I did liberally use AP and lateral fluoroscopy to ensure that the implant was in the appropriate position, and was very pleased with the radiographs. The wound was copiously irrigated.  The fascia was  closed using #1 PDS.  The subcutaneous layer was closed using 0 Vicryl followed by 2-0 Vicryl, and the skin was closed using 4-0 Monocryl. Benzoin and Steri-Strips were applied followed by sterile dressing.   All instrument counts were correct at the termination of the procedure.   Of note, Pricilla Holm was my assistant throughout surgery, and did aid in retraction, suctioning, placement of the hardware, and closure.   Phylliss Bob, MD
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
## 2022-03-09 NOTE — Anesthesia Procedure Notes (Signed)
Procedure Name: Intubation Date/Time: 03/09/2022 8:42 AM  Performed by: Eligha Bridegroom, CRNAPre-anesthesia Checklist: Patient identified, Emergency Drugs available, Suction available, Patient being monitored and Timeout performed Patient Re-evaluated:Patient Re-evaluated prior to induction Oxygen Delivery Method: Circle system utilized Preoxygenation: Pre-oxygenation with 100% oxygen Induction Type: IV induction Ventilation: Oral airway inserted - appropriate to patient size Laryngoscope Size: Mac Grade View: Grade I Tube type: Oral Tube size: 7.0 mm Number of attempts: 1 Airway Equipment and Method: Stylet Placement Confirmation: ETT inserted through vocal cords under direct vision, positive ETCO2 and breath sounds checked- equal and bilateral Secured at: 21 cm Tube secured with: Tape Dental Injury: Teeth and Oropharynx as per pre-operative assessment

## 2022-03-09 NOTE — Transfer of Care (Signed)
Immediate Anesthesia Transfer of Care Note  Patient: Sharon Vazquez  Procedure(s) Performed: ANTERIOR LUMBAR INTERBODY FUSION LUMBAR 5- SACRUM 1 WITH INSTRUMENTATION AND ALLOGRAFT ABDOMINAL EXPOSURE  Patient Location: PACU  Anesthesia Type:General  Level of Consciousness: drowsy  Airway & Oxygen Therapy: Patient Spontanous Breathing and Patient connected to nasal cannula oxygen  Post-op Assessment: Report given to RN and Post -op Vital signs reviewed and stable  Post vital signs: Reviewed and stable  Last Vitals:  Vitals Value Taken Time  BP 122/74 03/09/22 1106  Temp    Pulse 77 03/09/22 1107  Resp 15 03/09/22 1107  SpO2 100 % 03/09/22 1107  Vitals shown include unvalidated device data.  Last Pain:  Vitals:   03/09/22 0649  TempSrc:   PainSc: 5          Complications: No notable events documented.

## 2022-03-10 ENCOUNTER — Inpatient Hospital Stay (HOSPITAL_COMMUNITY): Admission: RE | Admit: 2022-03-10 | Payer: 59 | Source: Home / Self Care | Admitting: Orthopedic Surgery

## 2022-03-10 ENCOUNTER — Inpatient Hospital Stay (HOSPITAL_COMMUNITY): Payer: 59

## 2022-03-10 ENCOUNTER — Encounter (HOSPITAL_COMMUNITY): Payer: Self-pay | Admitting: Orthopedic Surgery

## 2022-03-10 ENCOUNTER — Inpatient Hospital Stay (HOSPITAL_COMMUNITY)
Admission: AD | Disposition: A | Discharge status: Home / Self Care | Payer: Self-pay | Source: Ambulatory Visit | Attending: Orthopedic Surgery

## 2022-03-10 ENCOUNTER — Inpatient Hospital Stay (HOSPITAL_COMMUNITY): Payer: 59 | Admitting: Certified Registered"

## 2022-03-10 DIAGNOSIS — I10 Essential (primary) hypertension: Secondary | ICD-10-CM

## 2022-03-10 DIAGNOSIS — F418 Other specified anxiety disorders: Secondary | ICD-10-CM

## 2022-03-10 DIAGNOSIS — F909 Attention-deficit hyperactivity disorder, unspecified type: Secondary | ICD-10-CM

## 2022-03-10 DIAGNOSIS — Z87891 Personal history of nicotine dependence: Secondary | ICD-10-CM

## 2022-03-10 DIAGNOSIS — M4807 Spinal stenosis, lumbosacral region: Secondary | ICD-10-CM

## 2022-03-10 LAB — POCT I-STAT EG7
Acid-Base Excess: 2 mmol/L (ref 0.0–2.0)
Bicarbonate: 30.5 mmol/L — ABNORMAL HIGH (ref 20.0–28.0)
Calcium, Ion: 1.22 mmol/L (ref 1.15–1.40)
HCT: 39 % (ref 36.0–46.0)
Hemoglobin: 13.3 g/dL (ref 12.0–15.0)
O2 Saturation: 28 %
Potassium: 4.3 mmol/L (ref 3.5–5.1)
Sodium: 138 mmol/L (ref 135–145)
TCO2: 32 mmol/L (ref 22–32)
pCO2, Ven: 64.1 mmHg — ABNORMAL HIGH (ref 44–60)
pH, Ven: 7.286 (ref 7.25–7.43)
pO2, Ven: 21 mmHg — CL (ref 32–45)

## 2022-03-10 SURGERY — POSTERIOR LUMBAR FUSION 2 LEVEL
Anesthesia: General

## 2022-03-10 MED ORDER — DEXAMETHASONE SODIUM PHOSPHATE 10 MG/ML IJ SOLN
INTRAMUSCULAR | Status: DC | PRN
  Administered 2022-03-10: 10 mg via INTRAVENOUS

## 2022-03-10 MED ORDER — MIDAZOLAM HCL 2 MG/2ML IJ SOLN
INTRAMUSCULAR | Status: AC
  Filled 2022-03-10: qty 2

## 2022-03-10 MED ORDER — LIDOCAINE 2% (20 MG/ML) 5 ML SYRINGE
INTRAMUSCULAR | Status: DC | PRN
  Administered 2022-03-10: 60 mg via INTRAVENOUS

## 2022-03-10 MED ORDER — BUPIVACAINE LIPOSOME 1.3 % IJ SUSP
INTRAMUSCULAR | Status: AC
  Filled 2022-03-10: qty 20

## 2022-03-10 MED ORDER — AMISULPRIDE (ANTIEMETIC) 5 MG/2ML IV SOLN: 10.0000 mg | Freq: Once | INTRAVENOUS | Status: DC | PRN

## 2022-03-10 MED ORDER — SUFENTANIL CITRATE 50 MCG/ML IV SOLN
INTRAVENOUS | Status: AC
  Filled 2022-03-10: qty 1

## 2022-03-10 MED ORDER — DEXAMETHASONE SODIUM PHOSPHATE 10 MG/ML IJ SOLN
INTRAMUSCULAR | Status: AC
  Filled 2022-03-10: qty 1

## 2022-03-10 MED ORDER — SUFENTANIL CITRATE 50 MCG/ML IV SOLN
INTRAVENOUS | Status: DC | PRN
  Administered 2022-03-10: 10 ug via INTRAVENOUS
  Administered 2022-03-10 (×2): 5 ug via INTRAVENOUS

## 2022-03-10 MED ORDER — SODIUM CHLORIDE (PF) 0.9 % IJ SOLN
INTRAMUSCULAR | Status: AC
  Filled 2022-03-10: qty 10

## 2022-03-10 MED ORDER — MIDAZOLAM HCL 2 MG/2ML IJ SOLN
INTRAMUSCULAR | Status: DC | PRN
  Administered 2022-03-10: 2 mg via INTRAVENOUS

## 2022-03-10 MED ORDER — PROPOFOL 10 MG/ML IV BOLUS
INTRAVENOUS | Status: DC | PRN
  Administered 2022-03-10: 140 mg via INTRAVENOUS

## 2022-03-10 MED ORDER — ALBUMIN HUMAN 5 % IV SOLN: INTRAVENOUS | Status: DC | PRN

## 2022-03-10 MED ORDER — LIDOCAINE 2% (20 MG/ML) 5 ML SYRINGE
INTRAMUSCULAR | Status: AC
  Filled 2022-03-10: qty 5

## 2022-03-10 MED ORDER — ONDANSETRON HCL 4 MG/2ML IJ SOLN
INTRAMUSCULAR | Status: AC
  Filled 2022-03-10: qty 2

## 2022-03-10 MED ORDER — FENTANYL CITRATE (PF) 100 MCG/2ML IJ SOLN: 25.0000 ug | INTRAMUSCULAR | Status: DC | PRN

## 2022-03-10 MED ORDER — THROMBIN 20000 UNITS EX KIT
PACK | CUTANEOUS | Status: DC | PRN
  Administered 2022-03-10: 20 mL via TOPICAL

## 2022-03-10 MED ORDER — BUPIVACAINE LIPOSOME 1.3 % IJ SUSP
INTRAMUSCULAR | Status: DC | PRN
  Administered 2022-03-10: 20 mL

## 2022-03-10 MED ORDER — CYCLOBENZAPRINE HCL 10 MG PO TABS: 10.0000 mg | ORAL_TABLET | Freq: Three times a day (TID) | ORAL | 0 refills | Status: DC

## 2022-03-10 MED ORDER — PHENYLEPHRINE 80 MCG/ML (10ML) SYRINGE FOR IV PUSH (FOR BLOOD PRESSURE SUPPORT)
PREFILLED_SYRINGE | INTRAVENOUS | Status: DC | PRN
  Administered 2022-03-10 (×3): 160 ug via INTRAVENOUS

## 2022-03-10 MED ORDER — SUGAMMADEX SODIUM 200 MG/2ML IV SOLN
INTRAVENOUS | Status: DC | PRN
  Administered 2022-03-10: 200 mg via INTRAVENOUS

## 2022-03-10 MED ORDER — CEFAZOLIN SODIUM-DEXTROSE 2-3 GM-%(50ML) IV SOLR
INTRAVENOUS | Status: DC | PRN
  Administered 2022-03-10: 2 g via INTRAVENOUS

## 2022-03-10 MED ORDER — THROMBIN (RECOMBINANT) 20000 UNITS EX SOLR
CUTANEOUS | Status: AC
  Filled 2022-03-10: qty 20000

## 2022-03-10 MED ORDER — ONDANSETRON HCL 4 MG/2ML IJ SOLN
INTRAMUSCULAR | Status: DC | PRN
  Administered 2022-03-10: 4 mg via INTRAVENOUS

## 2022-03-10 MED ORDER — BUPIVACAINE-EPINEPHRINE (PF) 0.25% -1:200000 IJ SOLN
INTRAMUSCULAR | Status: AC
  Filled 2022-03-10: qty 30

## 2022-03-10 MED ORDER — ROCURONIUM BROMIDE 10 MG/ML (PF) SYRINGE
PREFILLED_SYRINGE | INTRAVENOUS | Status: DC | PRN
  Administered 2022-03-10: 60 mg via INTRAVENOUS
  Administered 2022-03-10 (×2): 20 mg via INTRAVENOUS

## 2022-03-10 MED ORDER — OXYCODONE HCL ER 15 MG PO T12A: 15.0000 mg | EXTENDED_RELEASE_TABLET | Freq: Two times a day (BID) | ORAL | 0 refills | Status: DC

## 2022-03-10 MED ORDER — PHENYLEPHRINE HCL-NACL 20-0.9 MG/250ML-% IV SOLN
INTRAVENOUS | Status: DC | PRN
  Administered 2022-03-10: 40 ug/min via INTRAVENOUS

## 2022-03-10 MED ORDER — BUPIVACAINE-EPINEPHRINE 0.25% -1:200000 IJ SOLN
INTRAMUSCULAR | Status: DC | PRN
  Administered 2022-03-10: 20 mL
  Administered 2022-03-10: 10 mL

## 2022-03-10 MED ORDER — 0.9 % SODIUM CHLORIDE (POUR BTL) OPTIME
TOPICAL | Status: DC | PRN
  Administered 2022-03-10 (×2): 1000 mL

## 2022-03-10 MED ORDER — PROPOFOL 10 MG/ML IV BOLUS
INTRAVENOUS | Status: AC
  Filled 2022-03-10: qty 20

## 2022-03-10 MED ORDER — ROCURONIUM BROMIDE 10 MG/ML (PF) SYRINGE
PREFILLED_SYRINGE | INTRAVENOUS | Status: AC
  Filled 2022-03-10: qty 10

## 2022-03-10 MED ORDER — LACTATED RINGERS IV SOLN: INTRAVENOUS | Status: DC | PRN

## 2022-03-10 MED FILL — Heparin Sodium (Porcine) Inj 1000 Unit/ML: INTRAMUSCULAR | Qty: 30 | Status: AC

## 2022-03-10 MED FILL — Sodium Chloride IV Soln 0.9%: INTRAVENOUS | Qty: 1000 | Status: AC

## 2022-03-10 MED FILL — Thrombin (Recombinant) For Soln 20000 Unit: CUTANEOUS | Qty: 1 | Status: AC

## 2022-03-10 SURGICAL SUPPLY — 81 items
APL SKNCLS STERI-STRIP NONHPOA (GAUZE/BANDAGES/DRESSINGS) ×2
BAG COUNTER SPONGE SURGICOUNT (BAG) ×1 IMPLANT
BAG SPNG CNTER NS LX DISP (BAG) ×1
BENZOIN TINCTURE PRP APPL 2/3 (GAUZE/BANDAGES/DRESSINGS) IMPLANT
BUR PRESCISION 1.7 ELITE (BURR) IMPLANT
BUR ROUND FLUTED 5 RND (BURR) IMPLANT
CLSR STERI-STRIP ANTIMIC 1/2X4 (GAUZE/BANDAGES/DRESSINGS) IMPLANT
CNTNR URN SCR LID CUP LEK RST (MISCELLANEOUS) ×1 IMPLANT
CONT SPEC 4OZ STRL OR WHT (MISCELLANEOUS) ×1
CORD BIPOLAR FORCEPS 12FT (ELECTRODE) IMPLANT
COVER SURGICAL LIGHT HANDLE (MISCELLANEOUS) ×1 IMPLANT
DRAIN CHANNEL 15F RND FF W/TCR (WOUND CARE) ×1 IMPLANT
DRAPE C-ARM 42X72 X-RAY (DRAPES) ×1 IMPLANT
DRAPE C-ARMOR (DRAPES) IMPLANT
DRAPE POUCH INSTRU U-SHP 10X18 (DRAPES) ×1 IMPLANT
DRAPE SURG 17X23 STRL (DRAPES) ×4 IMPLANT
DURAPREP 26ML APPLICATOR (WOUND CARE) ×1 IMPLANT
ELECT BLADE 4.0 EZ CLEAN MEGAD (MISCELLANEOUS) ×2
ELECT CAUTERY BLADE 6.4 (BLADE) ×2 IMPLANT
ELECT REM PT RETURN 9FT ADLT (ELECTROSURGICAL) ×1
ELECTRODE BLDE 4.0 EZ CLN MEGD (MISCELLANEOUS) ×1 IMPLANT
ELECTRODE REM PT RTRN 9FT ADLT (ELECTROSURGICAL) ×1 IMPLANT
EVACUATOR SILICONE 100CC (DRAIN) ×1 IMPLANT
GAUZE 4X4 16PLY ~~LOC~~+RFID DBL (SPONGE) ×2 IMPLANT
GAUZE SPONGE 4X4 12PLY STRL (GAUZE/BANDAGES/DRESSINGS) ×1 IMPLANT
GLOVE BIO SURGEON STRL SZ7 (GLOVE) ×1 IMPLANT
GLOVE BIO SURGEON STRL SZ8 (GLOVE) ×1 IMPLANT
GLOVE BIOGEL PI IND STRL 7.0 (GLOVE) ×1 IMPLANT
GLOVE BIOGEL PI IND STRL 8 (GLOVE) ×1 IMPLANT
GLOVE SURG ENC MOIS LTX SZ6.5 (GLOVE) ×1 IMPLANT
GOWN STRL REUS W/ TWL LRG LVL3 (GOWN DISPOSABLE) ×2 IMPLANT
GOWN STRL REUS W/ TWL XL LVL3 (GOWN DISPOSABLE) ×1 IMPLANT
GOWN STRL REUS W/TWL LRG LVL3 (GOWN DISPOSABLE) ×2
GOWN STRL REUS W/TWL XL LVL3 (GOWN DISPOSABLE) ×1
GUIDEWIRE SHARP VIPER II (WIRE) IMPLANT
IV CATH 14GX2 1/4 (CATHETERS) ×1 IMPLANT
KIT ALARA NEURO ACCESS (KITS) IMPLANT
KIT BASIN OR (CUSTOM PROCEDURE TRAY) ×1 IMPLANT
KIT INFUSE X SMALL 1.4CC (Orthopedic Implant) IMPLANT
KIT POSITION SURG JACKSON T1 (MISCELLANEOUS) ×1 IMPLANT
KIT TURNOVER KIT B (KITS) ×1 IMPLANT
MARKER SKIN DUAL TIP RULER LAB (MISCELLANEOUS) ×1 IMPLANT
NDL HYPO 25GX1X1/2 BEV (NEEDLE) ×1 IMPLANT
NDL SPNL 18GX3.5 QUINCKE PK (NEEDLE) ×2 IMPLANT
NEEDLE HYPO 25GX1X1/2 BEV (NEEDLE) ×1 IMPLANT
NEEDLE SPNL 18GX3.5 QUINCKE PK (NEEDLE) ×2 IMPLANT
NS IRRIG 1000ML POUR BTL (IV SOLUTION) ×1 IMPLANT
PACK LAMINECTOMY ORTHO (CUSTOM PROCEDURE TRAY) ×1 IMPLANT
PACK UNIVERSAL I (CUSTOM PROCEDURE TRAY) ×1 IMPLANT
PAD ARMBOARD 7.5X6 YLW CONV (MISCELLANEOUS) ×2 IMPLANT
PATTIES SURGICAL .5 X1 (DISPOSABLE) ×1 IMPLANT
PATTIES SURGICAL .5X1.5 (GAUZE/BANDAGES/DRESSINGS) ×1 IMPLANT
PATTIES SURGICAL .75X.75 (GAUZE/BANDAGES/DRESSINGS) ×1 IMPLANT
PENCIL BUTTON HOLSTER BLD 10FT (ELECTRODE) IMPLANT
PUTTY BONE DBX 5CC MIX (Putty) IMPLANT
ROD EXPEDIUM PER BENT 65MM (Rod) IMPLANT
ROD SPINAL EXPEDIUM 5.5X70 (Rod) IMPLANT
SCREW CORTICAL VIPER 7X35 (Screw) IMPLANT
SCREW POLY VIPER2 7X40MM (Screw) IMPLANT
SCREW SET SINGLE INNER (Screw) IMPLANT
SPONGE INTESTINAL PEANUT (DISPOSABLE) IMPLANT
SPONGE SURGIFOAM ABS GEL 100 (HEMOSTASIS) IMPLANT
STRIP CLOSURE SKIN 1/2X4 (GAUZE/BANDAGES/DRESSINGS) IMPLANT
SUT MNCRL AB 4-0 PS2 18 (SUTURE) ×1 IMPLANT
SUT VIC AB 0 CT1 18XCR BRD 8 (SUTURE) ×2 IMPLANT
SUT VIC AB 0 CT1 8-18 (SUTURE) ×2
SUT VIC AB 1 CT1 18XCR BRD 8 (SUTURE) ×2 IMPLANT
SUT VIC AB 1 CT1 8-18 (SUTURE) ×3
SUT VIC AB 2-0 CT2 18 VCP726D (SUTURE) ×2 IMPLANT
SYR 20ML LL LF (SYRINGE) ×1 IMPLANT
SYR BULB IRRIG 60ML STRL (SYRINGE) ×1 IMPLANT
SYR CONTROL 10ML LL (SYRINGE) ×2 IMPLANT
SYR TB 1ML LUER SLIP (SYRINGE) ×1 IMPLANT
TAP CANN VIPER2 DL 6.0 (TAP) IMPLANT
TAP CANN VIPER2 DL 7.0 (TAP) IMPLANT
TAPE CLOTH SURG 4X10 WHT LF (GAUZE/BANDAGES/DRESSINGS) IMPLANT
TOWEL GREEN STERILE (TOWEL DISPOSABLE) ×1 IMPLANT
TOWEL GREEN STERILE FF (TOWEL DISPOSABLE) ×1 IMPLANT
TRAY FOLEY MTR SLVR 16FR STAT (SET/KITS/TRAYS/PACK) ×1 IMPLANT
WATER STERILE IRR 1000ML POUR (IV SOLUTION) ×1 IMPLANT
YANKAUER SUCT BULB TIP NO VENT (SUCTIONS) ×1 IMPLANT

## 2022-03-10 NOTE — Progress Notes (Signed)
[facility]  Subjective  -seen in preop for second stage with Dr. Lynann Bologna with no complaints.   Objective 122/62 82 99.1 F (37.3 C) (Oral) 18 100%  Intake/Output Summary (Last 24 hours) at 03/10/2022 0757 Last data filed at 03/09/2022 1535 Gross per 24 hour  Intake 1700 ml  Output 780 ml  Net 920 ml    Abdomen with appropriate postop incisional tenderness Left DP palpable  Laboratory Lab Results: No results for input(s): "WBC", "HGB", "HCT", "PLT" in the last 72 hours. BMET No results for input(s): "NA", "K", "CL", "CO2", "GLUCOSE", "BUN", "CREATININE", "CALCIUM" in the last 72 hours.  COAG Lab Results  Component Value Date   INR 1.11 10/12/2016   INR 0.92 03/30/2016   No results found for: "PTT"  Assessment/Planning:  Postop day 1 status post anterior spine exposure for L5-S1 ALIF with Dr. Lynann Bologna.  Looks good this morning with appropriate postop incisional tenderness of the abdomen.  Left DP palpable.  Returning to the OR for stage II today.  Please call vascular with any questions or concerns.  Marty Heck 03/10/2022 7:57 AM --

## 2022-03-10 NOTE — Anesthesia Postprocedure Evaluation (Signed)
Anesthesia Post Note  Patient: Chief Executive Officer  Procedure(s) Performed: POSTERIOR SPINAL FUSION LUMBA 4- LUMBAR 5, LUMBAR 5- [place] AND ALLOGRAFT     Patient location during evaluation: PACU Anesthesia Type: General Level of consciousness: awake and alert Pain management: pain level controlled Vital Signs Assessment: post-procedure vital signs reviewed and stable Respiratory status: spontaneous breathing, nonlabored ventilation, respiratory function stable and patient connected to nasal cannula oxygen Cardiovascular status: blood pressure returned to baseline and stable Postop Assessment: no apparent nausea or vomiting Anesthetic complications: no   No notable events documented.  Last Vitals:  Vitals:   03/10/22 1030 03/10/22 1108  BP: 115/67 116/63  Pulse: 93 99  Resp: 17 16  Temp: 36.7 C 37.4 C  SpO2: 97% 100%    Last Pain:  Vitals:   03/10/22 1108  TempSrc: Oral  PainSc:                  Tiajuana Amass

## 2022-03-10 NOTE — Progress Notes (Signed)
PT Cancellation Note  Patient Details Name: Sharon Vazquez MRN: [id number] DOB: [birth year]   Cancelled Treatment:    Reason Eval/Treat Not Completed: Patient at procedure or test/unavailable. PT eval received, chart reviewed. Noted pt underwent stage 1 of surgery yesterday and is currently in OR for stage 2 of surgery. Will check back as schedule allows to initiate PT evaluation.    Thelma Comp 03/10/2022, 8:13 AM  Rolinda Roan, PT, DPT [facility] Secure Chat Preferred Office: [phone]

## 2022-03-10 NOTE — Progress Notes (Signed)
OT Cancellation Note  Patient Details Name: Clairissa Valvano MRN: [id number] DOB: 1980/08/29   Cancelled Treatment:    Reason Eval/Treat Not Completed: Patient at procedure or test/ unavailable  Nadia Torr,HILLARY 03/10/2022, 7:10 AM Maurie Boettcher, OT/L   Acute OT Clinical Specialist Acute Rehabilitation Services Pager [phone] Office [phone]

## 2022-03-10 NOTE — Evaluation (Signed)
Physical Therapy Evaluation  Patient Details Name: Sharon Vazquez MRN: [id number] DOB: 11/29/1980 Today's Date: 03/10/2022  History of Present Illness  Pt is a 41 y/o female who presents s/p L5-S1 ALIF on 03/09/2022 and L4-S1 PLIF on 03/10/2022. PMH significant for ADHD, anxiety, Bipolar 1, HSV-2, HTN, migraines, prior back surgery x4.   Clinical Impression  Pt admitted with above diagnosis. At the time of PT eval, pt was able to demonstrate transfers and ambulation with gross min guard assist and RW for support. Pt was educated on precautions, brace application/wearing schedule, appropriate activity progression, and car transfer. Pt currently with functional limitations due to the deficits listed below (see PT Problem List). Pt will benefit from skilled PT to increase their independence and safety with mobility to allow discharge to the venue listed below.         Recommendations for follow up therapy are one component of a multi-disciplinary discharge planning process, led by the attending physician.  Recommendations may be updated based on patient status, additional functional criteria and insurance authorization.  Follow Up Recommendations No PT follow up      Assistance Recommended at Discharge PRN  Patient can return home with the following  A little help with walking and/or transfers;A little help with bathing/dressing/bathroom;Assistance with cooking/housework;Assist for transportation;Help with stairs or ramp for entrance    Equipment Recommendations Rolling walker (2 wheels);BSC/3in1  Recommendations for Other Services       Functional Status Assessment Patient has had a recent decline in their functional status and demonstrates the ability to make significant improvements in function in a reasonable and predictable amount of time.     Precautions / Restrictions Precautions Precautions: Fall;Back Precaution Booklet Issued: Yes (comment) Precaution Comments: Reviewed handout  and pt was cued for precautions during functional mobility. Required Braces or Orthoses: Spinal Brace Spinal Brace: Thoracolumbosacral orthotic;Applied in sitting position Restrictions Weight Bearing Restrictions: No      Mobility  Bed Mobility Overal bed mobility: Needs Assistance Bed Mobility: Rolling, Sidelying to Sit, Sit to Sidelying Rolling: Supervision Sidelying to sit: Supervision     Sit to sidelying: Supervision General bed mobility comments: HOB elevated. Increased time to transition to/from EOB and cues provided throughout for optimal log roll technique.    Transfers Overall transfer level: Needs assistance Equipment used: Rolling walker (2 wheels) Transfers: Sit to/from Stand Sit to Stand: Min guard           General transfer comment: Close guard for safety as pt powered up to full stand. No unsteadiness or LOB noted, however pt reliant on the RW for support.    Ambulation/Gait Ambulation/Gait assistance: Min guard Gait Distance (Feet): 200 Feet Assistive device: Rolling walker (2 wheels) Gait Pattern/deviations: Step-through pattern, Decreased stride length, Trunk flexed Gait velocity: Decreased Gait velocity interpretation: <1.31 ft/sec, indicative of household ambulator   General Gait Details: Slow and guarded with flexed trunk due to pain. No overt LOB noted however pt reliant on RW for support.  Stairs            Wheelchair Mobility    Modified Rankin (Stroke Patients Only)       Balance Overall balance assessment: Needs assistance Sitting-balance support: Feet supported, No upper extremity supported Sitting balance-Leahy Scale: Fair     Standing balance support: Bilateral upper extremity supported, During functional activity, Reliant on assistive device for balance Standing balance-Leahy Scale: Poor Standing balance comment: RW for support  Pertinent Vitals/Pain Pain Assessment Pain  Assessment: Faces Faces Pain Scale: Hurts little more Pain Location: Incision site/low back Pain Descriptors / Indicators: Operative site guarding, Sore Pain Intervention(s): Limited activity within patient's tolerance, Monitored during session, Repositioned    Home Living Family/patient expects to be discharged to:: Private residence Living Arrangements: Spouse/significant other Available Help at Discharge: Family Type of Home: House Home Access: Stairs to enter Entrance Stairs-Rails: Right Entrance Stairs-Number of Steps: 3   Home Layout: One level Home Equipment: None      Prior Function Prior Level of Function : Independent/Modified Independent                     Hand Dominance   Dominant Hand: Right    Extremity/Trunk Assessment   Upper Extremity Assessment Upper Extremity Assessment: Overall WFL for tasks assessed    Lower Extremity Assessment Lower Extremity Assessment: Generalized weakness (Mild; consistent with pre-op diagnosis.)    Cervical / Trunk Assessment Cervical / Trunk Assessment: Back Surgery  Communication   Communication: No difficulties  Cognition Arousal/Alertness: Awake/alert Behavior During Therapy: WFL for tasks assessed/performed Overall Cognitive Status: Within Functional Limits for tasks assessed                                          General Comments      Exercises     Assessment/Plan    PT Assessment Patient needs continued PT services  PT Problem List Decreased strength;Decreased activity tolerance;Decreased balance;Decreased mobility;Decreased knowledge of use of DME;Decreased safety awareness;Decreased knowledge of precautions;Pain       PT Treatment Interventions DME instruction;Gait training;Functional mobility training;Therapeutic activities;Therapeutic exercise;Neuromuscular re-education;Patient/family education    PT Goals (Current goals can be found in the Care Plan section)  Acute Rehab  PT Goals Patient Stated Goal: Home tomorrow PT Goal Formulation: With patient/family Time For Goal Achievement: 03/17/22 Potential to Achieve Goals: Good    Frequency Min 5X/week     Co-evaluation               AM-PAC PT "6 Clicks" Mobility  Outcome Measure Help needed turning from your back to your side while in a flat bed without using bedrails?: A Little Help needed moving from lying on your back to sitting on the side of a flat bed without using bedrails?: A Little Help needed moving to and from a bed to a chair (including a wheelchair)?: A Little Help needed standing up from a chair using your arms (e.g., wheelchair or bedside chair)?: A Little Help needed to walk in hospital room?: A Little Help needed climbing 3-5 steps with a railing? : A Little 6 Click Score: 18    End of Session Equipment Utilized During Treatment: Gait belt;Back brace Activity Tolerance: Patient tolerated treatment well Patient left: in bed;with call bell/phone within reach;with family/visitor present Nurse Communication: Mobility status PT Visit Diagnosis: Unsteadiness on feet (R26.81);Pain Pain - part of body:  (back)    Time: 4196-2229 PT Time Calculation (min) (ACUTE ONLY): 26 min   Charges:   PT Evaluation $PT Eval Moderate Complexity: 1 Mod PT Treatments $Gait Training: 8-22 mins        Rolinda Roan, PT, DPT Acute Rehabilitation Services Secure Chat Preferred Office: [phone]   Thelma Comp 03/10/2022, 3:17 PM

## 2022-03-10 NOTE — Progress Notes (Signed)
Pt transferred to OR in bed without any complaints of pain or any discomfort, A&O x4. V/S stable. Report given to [facility] CRNA.

## 2022-03-10 NOTE — Transfer of Care (Signed)
Immediate Anesthesia Transfer of Care Note  Patient: Faydra Laprise  Procedure(s) Performed: POSTERIOR SPINAL FUSION LUMBA 4- LUMBAR 5, LUMBAR 5- SACRUM1 WITH INSTRUMENTATION AND ALLOGRAFT  Patient Location: PACU  Anesthesia Type:General  Level of Consciousness: awake, oriented, and confused  Airway & Oxygen Therapy: Patient Spontanous Breathing  Post-op Assessment: Report given to RN and Post -op Vital signs reviewed and stable  Post vital signs: Reviewed and stable  Last Vitals:  Vitals Value Taken Time  BP 113/70 03/10/22 1003  Temp    Pulse 104 03/10/22 1005  Resp 13 03/10/22 1005  SpO2 100 % 03/10/22 1005  Vitals shown include unvalidated device data.  Last Pain:  Vitals:   03/10/22 0437  TempSrc: Oral  PainSc:       Patients Stated Pain Goal: 3 (51/02/58 5277)  Complications: No notable events documented.

## 2022-03-10 NOTE — Anesthesia Preprocedure Evaluation (Signed)
Anesthesia Evaluation  Patient identified by MRN, date of birth, ID band Patient awake    Reviewed: Allergy & Precautions, NPO status , Patient's Chart, lab work & pertinent test results, reviewed documented beta blocker date and time   Airway Mallampati: I  TM Distance: >3 FB Neck ROM: Full    Dental  (+) Dental Advisory Given, Partial Upper, Partial Lower   Pulmonary asthma , former smoker,    Pulmonary exam normal breath sounds clear to auscultation       Cardiovascular hypertension, Pt. 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  Neuromuscular disease    GI/Hepatic Neg liver ROS, GERD  ,  Endo/Other  Obesity   Renal/GU negative Renal ROS     Musculoskeletal  (+) Arthritis ,   Abdominal   Peds  (+) ADHD Hematology negative hematology ROS (+)   Anesthesia Other Findings   Reproductive/Obstetrics negative OB ROS                             Lab Results  Component Value Date   WBC 6.4 03/03/2022   HGB 13.2 03/03/2022   HCT 37.5 03/03/2022   MCV 89.9 03/03/2022   PLT 306 03/03/2022   Lab Results  Component Value Date   CREATININE 0.92 03/03/2022   BUN 10 03/03/2022   NA 138 03/03/2022   K 3.9 03/03/2022   CL 104 03/03/2022   CO2 23 03/03/2022    Anesthesia Physical  Anesthesia Plan  ASA: 3  Anesthesia Plan: General   Post-op Pain Management: Tylenol PO (pre-op)*, Toradol IV (intra-op)* and Ketamine IV*   Induction: Intravenous  PONV Risk Score and Plan: 3 and Midazolam, Ondansetron, Scopolamine patch - Pre-op and Propofol infusion  Airway Management Planned: Oral ETT  Additional Equipment: Arterial line  Intra-op Plan:   Post-operative Plan: Extubation in  OR  Informed Consent: I have reviewed the patients History and Physical, chart, labs and discussed the procedure including the risks, benefits and alternatives for the proposed anesthesia with the patient or authorized representative who has indicated his/her understanding and acceptance.     Dental advisory given  Plan Discussed with: CRNA  Anesthesia Plan Comments: (2nd PIV)        Anesthesia Quick Evaluation

## 2022-03-10 NOTE — H&P (Signed)
Patient tolerated stage one of her procedure yesterday uneventfully, with minimal blood loss.  We will proceed with stage 2 today, a posterior spinal fusion at L4-5 and L5-S1, with revision instrumentation, allograft, and bone morphogenic protein.

## 2022-03-10 NOTE — Anesthesia Postprocedure Evaluation (Signed)
Anesthesia Post Note  Patient: [employer]  Procedure(s) Performed: ANTERIOR LUMBAR INTERBODY FUSION LUMBAR 5- SACRUM 1 WITH INSTRUMENTATION AND ALLOGRAFT ABDOMINAL EXPOSURE     Patient location during evaluation: PACU Anesthesia Type: General Level of consciousness: awake and alert Pain management: pain level controlled Vital Signs Assessment: post-procedure vital signs reviewed and stable Respiratory status: spontaneous breathing, nonlabored ventilation, respiratory function stable and patient connected to nasal cannula oxygen Cardiovascular status: blood pressure returned to baseline and stable Postop Assessment: no apparent nausea or vomiting Anesthetic complications: no   No notable events documented.  Last Vitals:  Vitals:   03/10/22 1108 03/10/22 1547  BP: 116/63 127/78  Pulse: 99 97  Resp: 16 16  Temp: 37.4 C 37 C  SpO2: 100% 100%    Last Pain:  Vitals:   03/10/22 1547  TempSrc: Oral  PainSc:                  Sharon Vazquez

## 2022-03-10 NOTE — Anesthesia Procedure Notes (Signed)
Procedure Name: Intubation Date/Time: 03/10/2022 7:47 AM  Performed by: Anastasio Auerbach, CRNAPre-anesthesia Checklist: Patient identified, Emergency Drugs available, Suction available and Patient being monitored Patient Re-evaluated:Patient Re-evaluated prior to induction Oxygen Delivery Method: Circle system utilized Preoxygenation: Pre-oxygenation with 100% oxygen Induction Type: IV induction Ventilation: Mask ventilation without difficulty Laryngoscope Size: Mac and 3 Tube type: Oral Tube size: 7.5 mm Number of attempts: 1 Airway Equipment and Method: Stylet and Oral airway Placement Confirmation: ETT inserted through vocal cords under direct vision, positive ETCO2 and breath sounds checked- equal and bilateral Secured at: 21 cm Tube secured with: Tape Dental Injury: Teeth and Oropharynx as per pre-operative assessment

## 2022-03-10 NOTE — Op Note (Addendum)
PATIENT NAME: [employer]   MEDICAL RECORD NO.:   [id number]    DATE OF BIRTH: 1981-06-06   DATE OF PROCEDURE: 03/10/2022                               OPERATIVE REPORT     PREOPERATIVE DIAGNOSES: 1.  Status post previous L4-5 fusion, which did go on to a nonunion 2.  Status post L5-S1 anterior lumbar interbody fusion on 03/09/2022, requiring a posterior fusion with instrumentation 3.  Status post L3-4 fusion, which did heal successfully, with retained hardware 4.  L5-S1 spinal stenosis    POSTOPERATIVE DIAGNOSES:   1.  Status post previous L4-5 fusion, which did go on to a nonunion 2.  Status post L5-S1 anterior lumbar interbody fusion on 03/09/2022, requiring a posterior fusion with instrumentation 3.  Status post L3-4 fusion, which did heal successfully, with retained hardware 4.  L5-S1 spinal stenosis     PROCEDURE (Stage 2): 1. Posterior spinal fusion, L4-5, L5-S1. 2. Placement of posterior segmental instrumentation, L4, L5, S1 bilaterally. 3. Use of morselized allograft - DBX mix 4. Use of bone morphogenic protein 5. Intraoperative use of floroscopy 6. Removal of bilateral posterior instrumentation, L3 7. Removal and reinsertion of right L5 pedicle screw 8. Bilateral L5-S1 decompression, including bilateral partial facetectomy 9. Exploration of spinal fusion, L3-4   SURGEON:  Phylliss Bob, MD   ASSISTANT:  Pricilla Holm, PA-C   ANESTHESIA:  General endotracheal anesthesia.   COMPLICATIONS:  None.   DISPOSITION:  Stable.   ESTIMATED BLOOD LOSS:  Minimal   INDICATIONS FOR SURGERY: Briefly, Ms. Hollenberg is one day status post an anterior lumbar fusion as noted above.  Please refer to my operative report dated 03/09/2022, for a full account of the patient's preoperative history and indications for surgery.  Of note, in addition to her anterior lumbar fusion occurring on 03/09/2022, she did previously have an attempted fusion at L4-5.  This did go on to a nonunion.  She  was also noted to have stenosis involving the right and left foramen at L5-S1.  This continued to be symptomatic on the morning of her stage 2 procedure, so I did elect to proceed with a posterior decompression at this level.  The patient did present today for stage 2 of what was to be a 2-staged procedure.   OPERATIVE DETAILS:  On 03/10/2022, the patient was brought to surgery and general endotracheal anesthesia was administered.  The patient was placed prone onto a Jackson spinal bed.  The back was then prepped and draped in the usual sterile fashion.  I then made paramedian incisions on the right and left sides, just lateral to the lateral borders of the pedicles of L4, L5, and S1.  At this point, the previously placed hardware was noted bilaterally.  The caps associated with the L3, L4, and L5 pedicle screws were removed bilaterally, as were the interconnecting rods.  The L3 pedicle screws were uneventfully removed.  I then explored the fusion at L3-4, and this level was noted to be adequately fused, with clearly, a solid fusion across the facet joints bilaterally.  At this point, a bilateral partial facetectomy was performed at L5-S1, in order to thoroughly decompress the foramen on the right and left sides.  This was performed using a high-speed bur in addition to a series of Kerrison punches.  I was very pleased with the bilateral foraminal decompressions.   Then,  the posterolateral gutters bilaterally at L4-5 and L5-S1 were subperiosteally exposed.  A high-speed bur was then used to decorticate the transverse processes of L4, L5, and the sacral ala.  This was done in anticipation of a posterolateral fusion.  At this point, the bilateral L5 screws were evaluated, and were noted to be loose.  These was removed, and were upsized to 7 mm screws.  I then used Jamshidi's to cannulate the bilateral S1 pedicles using a cortical technique.  Liberally using AP and lateral fluoroscopy, guidewires were placed,  and a tap was advanced over the guidewires up to 6 mm.  7 x 40 mm screws were then advanced over the guidewires bilaterally.  The guidewires were then removed.  I then placed DBX mix in addition to bone morphogenic protein along the posterolateral gutters bilaterally at L4-5 and L5-S1.  Prior to this, the wound was copiously irrigated.  I then secured 65 mm rods into the tulip heads of the screws, from L4-S1.  Caps were then placed and a final locking procedure was performed bilaterally. I was very pleased with the final AP and lateral fluoroscopic images.  The wound was then copiously irrigated.  On the right and left sides, the fascia was closed using #1 Vicryl.  The subcutaneous layer was closed using 0 Vicryl followed by 2-0 Vicryl, and the skin was then closed using 4-0 Monocryl. Benzoin and Steri-Strips were applied followed by sterile dressing.  All instrument counts were correct at the termination of the procedure.    Of note, Pricilla Holm was my assistant throughout surgery, and did aid in retraction, suctioning, placement of the hardware, and closure.        Phylliss Bob, MD

## 2022-03-11 ENCOUNTER — Telehealth: Payer: Self-pay

## 2022-03-11 MED ORDER — FLUCONAZOLE 150 MG PO TABS: ORAL_TABLET | ORAL | 1 refills | Status: DC

## 2022-03-11 MED FILL — Thrombin (Recombinant) For Soln 20000 Unit: CUTANEOUS | Qty: 1 | Status: AC

## 2022-03-11 NOTE — Progress Notes (Signed)
Physical Therapy Treatment Patient Details Name: Sharon Vazquez MRN: [id number] DOB: Feb 18, 1981 Today's Date: 03/11/2022   History of Present Illness Pt is a 41 y/o female who presents s/p L5-S1 ALIF on 03/09/2022 and L4-S1 PLIF on 03/10/2022. PMH significant for ADHD, anxiety, Bipolar 1, HSV-2, HTN, migraines, prior back surgery x4.    PT Comments    Pt progressing well with post-op mobility. She was able to demonstrate transfers and ambulation with gross min guard assist and RW for support. Noted LE's appear weak and knees soft throughout gait cycle as she fatigued. Main complaint is constipation and associated abdominal pain. Reinforced education on precautions, brace application/wearing schedule, appropriate activity progression, and car transfer. Will continue to follow.      Recommendations for follow up therapy are one component of a multi-disciplinary discharge planning process, led by the attending physician.  Recommendations may be updated based on patient status, additional functional criteria and insurance authorization.  Follow Up Recommendations  No PT follow up     Assistance Recommended at Discharge PRN  Patient can return home with the following A little help with walking and/or transfers;A little help with bathing/dressing/bathroom;Assistance with cooking/housework;Assist for transportation;Help with stairs or ramp for entrance   Equipment Recommendations  Rolling walker (2 wheels);BSC/3in1    Recommendations for Other Services       Precautions / Restrictions Precautions Precautions: Fall;Back Precaution Booklet Issued: Yes (comment) Precaution Comments: pt educated on 3/3 back precautions Required Braces or Orthoses: Spinal Brace Spinal Brace: Thoracolumbosacral orthotic Restrictions Weight Bearing Restrictions: No     Mobility  Bed Mobility Overal bed mobility: Needs Assistance Bed Mobility: Rolling, Sidelying to Sit Rolling: Supervision Sidelying to  sit: Supervision       General bed mobility comments: HOB elevated. Pt required heavy use of rails and increased time to transition to EOB. VC's for optimal log roll technique.    Transfers Overall transfer level: Needs assistance Equipment used: Rolling walker (2 wheels) Transfers: Sit to/from Stand Sit to Stand: Min guard           General transfer comment: Close guard for safety as pt powered up to full stand. No unsteadiness or LOB noted, however pt reliant on the RW for support.    Ambulation/Gait Ambulation/Gait assistance: Min guard Gait Distance (Feet): 200 Feet Assistive device: Rolling walker (2 wheels) Gait Pattern/deviations: Step-through pattern, Decreased stride length, Trunk flexed Gait velocity: Decreased Gait velocity interpretation: <1.31 ft/sec, indicative of household ambulator   General Gait Details: Very slow and guarded due to pain. Pt reports LE's feel weak, and noted flexed knees during stance phase of gait cycle as pt fatigued. No sudden knee buckling noted, however.   Stairs Stairs: Yes Stairs assistance: Min guard, Min assist Stair Management: Two rails, Step to pattern, Forwards Number of Stairs: 1 General stair comments: Pt with increased pain, limiting activity. Pt unable to hold to only R railing (as she has at home), and was pulling up to next step with heavy use of UE support on both rails.   Wheelchair Mobility    Modified Rankin (Stroke Patients Only)       Balance Overall balance assessment: Needs assistance Sitting-balance support: Feet supported, No upper extremity supported Sitting balance-Leahy Scale: Good     Standing balance support: Bilateral upper extremity supported, During functional activity, Reliant on assistive device for balance Standing balance-Leahy Scale: Fair Standing balance comment: stands statically without RW support  Cognition Arousal/Alertness:  Awake/alert Behavior During Therapy: WFL for tasks assessed/performed Overall Cognitive Status: Within Functional Limits for tasks assessed                                          Exercises      General Comments General comments (skin integrity, edema, etc.): VSS on RA, spouse present      Pertinent Vitals/Pain Pain Assessment Pain Assessment: Faces Faces Pain Scale: Hurts whole lot Pain Location: back and abdomen Pain Descriptors / Indicators: Operative site guarding, Sore (Constipation per pt) Pain Intervention(s): Limited activity within patient's tolerance, Monitored during session, Repositioned    Home Living Family/patient expects to be discharged to:: Private residence Living Arrangements: Spouse/significant other Available Help at Discharge: Family Type of Home: House Home Access: Stairs to enter Entrance Stairs-Rails: Right Entrance Stairs-Number of Steps: 3   Home Layout: One level Home Equipment: Rollator (4 wheels);Cane - single point      Prior Function            PT Goals (current goals can now be found in the care plan section) Acute Rehab PT Goals Patient Stated Goal: Have a BM PT Goal Formulation: With patient/family Time For Goal Achievement: 03/17/22 Potential to Achieve Goals: Good Progress towards PT goals: Progressing toward goals    Frequency    Min 5X/week      PT Plan Current plan remains appropriate    Co-evaluation              AM-PAC PT "6 Clicks" Mobility   Outcome Measure  Help needed turning from your back to your side while in a flat bed without using bedrails?: A Little Help needed moving from lying on your back to sitting on the side of a flat bed without using bedrails?: A Little Help needed moving to and from a bed to a chair (including a wheelchair)?: A Little Help needed standing up from a chair using your arms (e.g., wheelchair or bedside chair)?: A Little Help needed to walk in hospital  room?: A Little Help needed climbing 3-5 steps with a railing? : A Little 6 Click Score: 18    End of Session Equipment Utilized During Treatment: Gait belt;Back brace Activity Tolerance: Patient tolerated treatment well Patient left: in bed;with call bell/phone within reach;with family/visitor present Nurse Communication: Mobility status PT Visit Diagnosis: Unsteadiness on feet (R26.81);Pain Pain - part of body:  (back)     Time: 7425-9563 PT Time Calculation (min) (ACUTE ONLY): 27 min  Charges:  $Gait Training: 23-37 mins                     Rolinda Roan, PT, DPT Acute Rehabilitation Services Secure Chat Preferred Office: [phone]    Thelma Comp 03/11/2022, 10:52 AM

## 2022-03-11 NOTE — Plan of Care (Signed)
  Problem: Pain Managment: Goal: General experience of comfort will improve Outcome: Completed/Met   Problem: Safety: Goal: Ability to remain free from injury will improve Outcome: Completed/Met   Problem: Skin Integrity: Goal: Risk for impaired skin integrity will decrease Outcome: Completed/Met   Problem: Education: Goal: Ability to verbalize activity precautions or restrictions will improve Outcome: Completed/Met Goal: Knowledge of the prescribed therapeutic regimen will improve Outcome: Completed/Met Goal: Understanding of discharge needs will improve Outcome: Completed/Met   Problem: Activity: Goal: Ability to avoid complications of mobility impairment will improve Outcome: Completed/Met Goal: Ability to tolerate increased activity will improve Outcome: Completed/Met Goal: Will remain free from falls Outcome: Completed/Met   Problem: Bowel/Gastric: Goal: Gastrointestinal status for postoperative course will improve Outcome: Completed/Met   Problem: Clinical Measurements: Goal: Ability to maintain clinical measurements within normal limits will improve Outcome: Completed/Met Goal: Postoperative complications will be avoided or minimized Outcome: Completed/Met Goal: Diagnostic test results will improve Outcome: Completed/Met   Problem: Pain Management: Goal: Pain level will decrease Outcome: Completed/Met   Problem: Skin Integrity: Goal: Will show signs of wound healing Outcome: Completed/Met   Problem: Health Behavior/Discharge Planning: Goal: Identification of resources available to assist in meeting health care needs will improve Outcome: Completed/Met   Problem: Bladder/Genitourinary: Goal: Urinary functional status for postoperative course will improve Outcome: Completed/Met

## 2022-03-11 NOTE — Evaluation (Signed)
Occupational Therapy Evaluation Patient Details Name: Sharon Vazquez MRN: [id number] DOB: 02-08-1981 Today's Date: 03/11/2022   History of Present Illness Pt is a 41 y/o female who presents s/p L5-S1 ALIF on 03/09/2022 and L4-S1 PLIF on 03/10/2022. PMH significant for ADHD, anxiety, Bipolar 1, HSV-2, HTN, migraines, prior back surgery x4.   Clinical Impression   Pt independent at baseline with ADLs and reported using cane intermittently for mobility, pt lives with spouse who will be present to assist at d/c. Pt currently needing mod I -mod A for ADLs, min guard-min A for bed mobility, and min guard for transfers with RW. Pt with increased pain during this time and muscle spasms. Educated pt on compensatory strategies for ADLs, bed mobility, brace wear, and back precautions. Pt verbalized understanding. Pt presenting with impairments listed below, will follow acutely. Recommend d/c home with family assistance.     Recommendations for follow up therapy are one component of a multi-disciplinary discharge planning process, led by the attending physician.  Recommendations may be updated based on patient status, additional functional criteria and insurance authorization.   Follow Up Recommendations  No OT follow up    Assistance Recommended at Discharge Intermittent Supervision/Assistance  Patient can return home with the following A little help with walking and/or transfers;A little help with bathing/dressing/bathroom;Assistance with cooking/housework;Direct supervision/assist for medications management;Direct supervision/assist for financial management;Assist for transportation;Help with stairs or ramp for entrance    Functional Status Assessment  Patient has had a recent decline in their functional status and demonstrates the ability to make significant improvements in function in a reasonable and predictable amount of time.  Equipment Recommendations  BSC/3in1;Other (comment) (RW)     Recommendations for Other Services PT consult     Precautions / Restrictions Precautions Precautions: Fall;Back Precaution Booklet Issued: Yes (comment) Precaution Comments: pt educated on 3/3 back precautions Required Braces or Orthoses: Spinal Brace Spinal Brace: Thoracolumbosacral orthotic Restrictions Weight Bearing Restrictions: No      Mobility Bed Mobility Overal bed mobility: Needs Assistance Bed Mobility: Rolling, Sidelying to Sit, Sit to Sidelying Rolling: Supervision Sidelying to sit: Min guard     Sit to sidelying: Min assist General bed mobility comments: incr assist for bringing LE's into bed    Transfers Overall transfer level: Needs assistance Equipment used: Rolling walker (2 wheels) Transfers: Sit to/from Stand Sit to Stand: Min guard                  Balance Overall balance assessment: Needs assistance Sitting-balance support: Feet supported, No upper extremity supported Sitting balance-Leahy Scale: Good     Standing balance support: Bilateral upper extremity supported, During functional activity, Reliant on assistive device for balance Standing balance-Leahy Scale: Fair Standing balance comment: stands statically without RW support                           ADL either performed or assessed with clinical judgement   ADL Overall ADL's : Needs assistance/impaired Eating/Feeding: Modified independent   Grooming: Modified independent   Upper Body Bathing: Moderate assistance   Lower Body Bathing: Moderate assistance   Upper Body Dressing : Minimal assistance   Lower Body Dressing: Minimal assistance   Toilet Transfer: Min guard   Toileting- Clothing Manipulation and Hygiene: Min guard       Functional mobility during ADLs: Min guard       Vision   Vision Assessment?: No apparent visual deficits     Perception  Praxis      Pertinent Vitals/Pain Pain Assessment Pain Assessment: Faces Pain Score: 4   Faces Pain Scale: Hurts little more Pain Location: back and abdomen Pain Descriptors / Indicators: Operative site guarding, Sore Pain Intervention(s): Limited activity within patient's tolerance, Monitored during session, Repositioned     Hand Dominance Right   Extremity/Trunk Assessment Upper Extremity Assessment Upper Extremity Assessment: Overall WFL for tasks assessed   Lower Extremity Assessment Lower Extremity Assessment: Generalized weakness   Cervical / Trunk Assessment Cervical / Trunk Assessment: Back Surgery   Communication Communication Communication: No difficulties   Cognition Arousal/Alertness: Awake/alert Behavior During Therapy: WFL for tasks assessed/performed Overall Cognitive Status: Within Functional Limits for tasks assessed                                       General Comments  VSS on RA, spouse present    Exercises     Shoulder Instructions      Home Living Family/patient expects to be discharged to:: Private residence Living Arrangements: Spouse/significant other Available Help at Discharge: Family Type of Home: House Home Access: Stairs to enter Technical brewer of Steps: 3 Entrance Stairs-Rails: Right Home Layout: One level     Bathroom Shower/Tub: Teacher, early years/pre: Standard     Home Equipment: Rollator (4 wheels);Cane - single point          Prior Functioning/Environment Prior Level of Function : Independent/Modified Independent             Mobility Comments: cane intermittently ADLs Comments: ind        OT Problem List: Decreased range of motion;Decreased activity tolerance;Decreased strength;Impaired balance (sitting and/or standing)      OT Treatment/Interventions: Therapeutic exercise;Self-care/ADL training;Energy conservation;DME and/or AE instruction;Therapeutic activities;Patient/family education;Balance training    OT Goals(Current goals can be found in the care plan  section) Acute Rehab OT Goals Patient Stated Goal: none stated OT Goal Formulation: With patient Time For Goal Achievement: 03/25/22 Potential to Achieve Goals: Good  OT Frequency: Min 2X/week    Co-evaluation              AM-PAC OT "6 Clicks" Daily Activity     Outcome Measure Help from another person eating meals?: None Help from another person taking care of personal grooming?: A Little Help from another person toileting, which includes using toliet, bedpan, or urinal?: A Little Help from another person bathing (including washing, rinsing, drying)?: A Lot Help from another person to put on and taking off regular upper body clothing?: A Little Help from another person to put on and taking off regular lower body clothing?: A Little 6 Click Score: 18   End of Session Equipment Utilized During Treatment: Back brace;Rolling walker (2 wheels) Nurse Communication: Mobility status  Activity Tolerance: Patient tolerated treatment well Patient left: in bed;with call bell/phone within reach;with family/visitor present  OT Visit Diagnosis: Unsteadiness on feet (R26.81);Other abnormalities of gait and mobility (R26.89);Muscle weakness (generalized) (M62.81)                Time: 7408-1448 OT Time Calculation (min): 36 min Charges:  OT General Charges $OT Visit: 1 Visit OT Evaluation $OT Eval Low Complexity: 1 Low OT Treatments $Self Care/Home Management : 8-22 mins  Lynnda Child, OTD, OTR/L Acute Rehab [phone]) [phone] - [place] 03/11/2022, 8:33 AM

## 2022-03-11 NOTE — Progress Notes (Signed)
Pt given 0600 medication and offered to ambulate pt at this time; pt declined & asked to wait until Mickel Baas comes in to work with her.

## 2022-03-11 NOTE — Telephone Encounter (Signed)
Patient called and stated that she is in the hospital and during her stay she was on a lot of antibiotics and has developed a yeast infection. Patient would like something called in.  The doctor said he could not write her a prescription because he is an orthopedic doctor.

## 2022-03-11 NOTE — Progress Notes (Signed)
Patient alert and oriented, surgical site clean, dry and intact. D/c instruction explain and given to the patient.

## 2022-03-11 NOTE — Telephone Encounter (Signed)
Pt was discharged from hospital today. She has had 2 back surgeries. Pt has been on a lot of antibiotics and has a yeast infection. Orthopedic dr says he can't prescribe Diflucan. Will you give med? Thanks! [place]

## 2022-03-11 NOTE — Telephone Encounter (Signed)
Will rx diflucan  

## 2022-03-11 NOTE — Progress Notes (Signed)
    Patient doing well  Denies leg pain   Physical Exam: Vitals:   03/11/22 0013 03/11/22 0552  BP: 116/69 117/61  Pulse: 92 90  Resp: 18 18  Temp: 99 F (37.2 C) 99.1 F (37.3 C)  SpO2: 100% 100%    Dressing in place NVI  POD #1 s/p A/P fusion and decompression spanning L4-S1  - up with PT/OT, Flexeril ambulation - Percocet for pain, Robaxin for muscle spasms - d/c home today with f/u in 2 weeks

## 2022-03-16 MED FILL — Sodium Chloride IV Soln 0.9%: INTRAVENOUS | Qty: 2000 | Status: AC

## 2022-03-16 MED FILL — Heparin Sodium (Porcine) Inj 1000 Unit/ML: INTRAMUSCULAR | Qty: 30 | Status: AC
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
## 2022-03-21 ENCOUNTER — Encounter (INDEPENDENT_AMBULATORY_CARE_PROVIDER_SITE_OTHER): Payer: Self-pay | Admitting: Gastroenterology

## 2022-03-21 ENCOUNTER — Ambulatory Visit (INDEPENDENT_AMBULATORY_CARE_PROVIDER_SITE_OTHER): Payer: 59 | Admitting: Gastroenterology

## 2022-03-21 VITALS — BP 103/71 | HR 91 | Temp 98.2°F | Ht 66.0 in | Wt 196.2 lb

## 2022-03-21 DIAGNOSIS — Z79891 Long term (current) use of opiate analgesic: Secondary | ICD-10-CM

## 2022-03-21 DIAGNOSIS — K5909 Other constipation: Secondary | ICD-10-CM | POA: Diagnosis not present

## 2022-03-21 DIAGNOSIS — K219 Gastro-esophageal reflux disease without esophagitis: Secondary | ICD-10-CM | POA: Diagnosis not present

## 2022-03-21 MED ORDER — NALOXEGOL OXALATE 25 MG PO TABS: 25.0000 mg | ORAL_TABLET | Freq: Every day | ORAL | 3 refills | Status: DC
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
## 2022-03-21 NOTE — Patient Instructions (Addendum)
Continue Trulance 3 mg qday Start Movantik 25 mg qday Start Benefiber fiber supplements daily to increase stool bulk Continue esomeprazole 40 mg every other day

## 2022-03-22 ENCOUNTER — Other Ambulatory Visit: Payer: Self-pay | Admitting: Family

## 2022-03-28 ENCOUNTER — Encounter: Payer: Self-pay | Admitting: Podiatry

## 2022-03-28 ENCOUNTER — Ambulatory Visit (INDEPENDENT_AMBULATORY_CARE_PROVIDER_SITE_OTHER): Payer: 59 | Admitting: Podiatry

## 2022-03-28 ENCOUNTER — Telehealth (INDEPENDENT_AMBULATORY_CARE_PROVIDER_SITE_OTHER): Payer: Self-pay | Admitting: *Deleted

## 2022-03-28 ENCOUNTER — Telehealth: Payer: Self-pay | Admitting: Podiatry

## 2022-03-28 DIAGNOSIS — M21611 Bunion of right foot: Secondary | ICD-10-CM

## 2022-03-28 DIAGNOSIS — M21619 Bunion of unspecified foot: Secondary | ICD-10-CM

## 2022-03-28 NOTE — Progress Notes (Signed)
Subjective:   Patient ID: [employer], female   DOB: 41 y.o.   MRN: [id number]   HPI Patient presents for surgical correction of chronic bunion deformity right.  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  Due to long-term nature of condition failure to respond conservatively patient wants surgery and I reviewed correction going over alternative treatments complications and she is willing to accept the risk of surgery and after extensive review signed consent form.  Patient scheduled outpatient surgery with all questions answered today understands total recovery can take 6 months to 1 year and today air fracture walker dispensed fitted properly to her lower leg and I like her to wear it prior to surgery to get used to it before the procedure  Evaluated her x-rays which indicated elevation of the intermetatarsal angle of approximately 15 degrees with enlargement of the first metatarsal head right foot

## 2022-03-28 NOTE — Telephone Encounter (Addendum)
DOS: 04/26/2022  [place] [employer]  Procedure: [place] Bunionectomy Rt [phone]) DX: M20.11  Deductible: $9,000 with $2,520.83 remaining OOP: $13,300 with $6,541.61 remaining  [employer] Requires Prior Authorization.  Reference #: [id number]  Prior Authorization is Not required for [employer] per [employer]. Reference #: [id number]  Prior Authorization is Not required for [employer] per Lynann Bologna A. Call Reference #: [id number]  [place].  Reference #: [id number]   Valid 04/26/2022 - 06/24/2022

## 2022-03-28 NOTE — Telephone Encounter (Signed)
Patient called and states insurance will not cover med that Dr. Jenetta Downer put her on. (I think med she is referring to is movantik) I tried to call patient to clarify med and she did not answer.

## 2022-03-28 NOTE — Telephone Encounter (Signed)
Movantik was denied. ( See letter on your desk) formulary alternatives are lubiprostone, symproic.

## 2022-03-29 ENCOUNTER — Other Ambulatory Visit (INDEPENDENT_AMBULATORY_CARE_PROVIDER_SITE_OTHER): Payer: Self-pay | Admitting: Internal Medicine

## 2022-03-29 ENCOUNTER — Other Ambulatory Visit: Payer: Self-pay | Admitting: Adult Health

## 2022-03-29 ENCOUNTER — Other Ambulatory Visit (INDEPENDENT_AMBULATORY_CARE_PROVIDER_SITE_OTHER): Payer: Self-pay | Admitting: Gastroenterology

## 2022-03-29 DIAGNOSIS — K5903 Drug induced constipation: Secondary | ICD-10-CM

## 2022-03-29 MED ORDER — SYMPROIC 0.2 MG PO TABS: 1.0000 | ORAL_TABLET | Freq: Every day | ORAL | 3 refills | Status: DC

## 2022-03-29 NOTE — Telephone Encounter (Signed)
Thanks. Will start her on Symproic 0.2 mg q day

## 2022-03-30 NOTE — Telephone Encounter (Signed)
Left message to return call to discuss with patient.  

## 2022-03-30 NOTE — Telephone Encounter (Signed)
Patient notified

## 2022-04-08 ENCOUNTER — Ambulatory Visit (INDEPENDENT_AMBULATORY_CARE_PROVIDER_SITE_OTHER): Payer: 59

## 2022-04-08 DIAGNOSIS — J4550 Severe persistent asthma, uncomplicated: Secondary | ICD-10-CM | POA: Diagnosis not present

## 2022-04-25 MED ORDER — ONDANSETRON HCL 4 MG PO TABS: 4.0000 mg | ORAL_TABLET | Freq: Three times a day (TID) | ORAL | 0 refills | Status: DC | PRN

## 2022-04-25 MED ORDER — OXYCODONE-ACETAMINOPHEN 10-325 MG PO TABS: 1.0000 | ORAL_TABLET | ORAL | 0 refills | Status: DC | PRN

## 2022-04-25 NOTE — Addendum Note (Signed)
Addended by: Wallene Huh on: 04/25/2022 02:12 PM   Modules accepted: Orders

## 2022-04-26 DIAGNOSIS — M2011 Hallux valgus (acquired), right foot: Secondary | ICD-10-CM

## 2022-05-02 ENCOUNTER — Encounter: Payer: 59 | Admitting: Podiatry

## 2022-05-02 ENCOUNTER — Ambulatory Visit (INDEPENDENT_AMBULATORY_CARE_PROVIDER_SITE_OTHER): Payer: 59

## 2022-05-02 DIAGNOSIS — M21619 Bunion of unspecified foot: Secondary | ICD-10-CM

## 2022-05-02 DIAGNOSIS — M21611 Bunion of right foot: Secondary | ICD-10-CM

## 2022-05-02 NOTE — Progress Notes (Signed)
Patient in the office today for POV #1 DOS 04/26/2022 [place] BUNIONECTOMY RT   Patient denies nausea, vomiting, fever and chills at this time. Patient states pain is well controlled. Patient denies calf pain and pulse in the right foot is strong. X-rays obtained of the right foot and were reviewed by the provider. Advised patient to continue wearing air fracture walker as directed and will transition into a surgical shoe slowly. Patient verbalized understanding.   Patient to return in 3 weeks for POV #2. Advised patient to call the office with any questions, comments, or concerns.

## 2022-05-06 ENCOUNTER — Telehealth: Payer: Self-pay | Admitting: *Deleted

## 2022-05-06 NOTE — Telephone Encounter (Signed)
I don't like to use lotion. Can use vitamin e oil

## 2022-05-06 NOTE — Telephone Encounter (Signed)
Patient is wanting to know if she can use some type of lotion or cream and keep gauze on when wrapping surgical area. She wanted to let the physician know that the area bleed a little after getting out of shower, possible rubbed too hard with wash cloth

## 2022-05-10 NOTE — Telephone Encounter (Signed)
Called patient, no answer but did leave a voice message per physician's recommendations.

## 2022-05-16 ENCOUNTER — Other Ambulatory Visit: Payer: 59

## 2022-05-23 ENCOUNTER — Ambulatory Visit (INDEPENDENT_AMBULATORY_CARE_PROVIDER_SITE_OTHER): Payer: 59

## 2022-05-23 ENCOUNTER — Other Ambulatory Visit: Payer: 59

## 2022-05-23 VITALS — BP 126/84 | HR 76

## 2022-05-23 DIAGNOSIS — M21619 Bunion of unspecified foot: Secondary | ICD-10-CM

## 2022-05-23 DIAGNOSIS — M21611 Bunion of right foot: Secondary | ICD-10-CM

## 2022-05-23 NOTE — Progress Notes (Signed)
Patient presents today for post op visit # 2, patient of Dr. Paulla Dolly.   POV # 2 DOS 04/26/22     She presents in her surgical shoe. Denies any falls or injury to the foot. Foot is swollen. No signs of infection. No calf pain or shortness of breath. Bandages dry and intact. Incision is intact. Patient given an anklet compression sleeve to wear on the right foot and advised patient to slowly transition into a supportive athletic type of shoe. Patient verbalized understanding.   BP: 126/84 P: 76    Xrays taken today and reviewed by Dr. Paulla Dolly.    Foot redressed today and placed her back in the boot. Reviewed icing and elevation. No farther post op visits needed at this time.

## 2022-06-03 ENCOUNTER — Ambulatory Visit (INDEPENDENT_AMBULATORY_CARE_PROVIDER_SITE_OTHER): Payer: 59

## 2022-06-03 DIAGNOSIS — J4550 Severe persistent asthma, uncomplicated: Secondary | ICD-10-CM

## 2022-06-27 ENCOUNTER — Ambulatory Visit
Admission: EM | Admit: 2022-06-27 | Discharge: 2022-06-27 | Disposition: A | Discharge status: Home / Self Care | Payer: 59 | Attending: Family Medicine | Admitting: Family Medicine

## 2022-06-27 DIAGNOSIS — J069 Acute upper respiratory infection, unspecified: Secondary | ICD-10-CM | POA: Diagnosis not present

## 2022-06-27 DIAGNOSIS — J4541 Moderate persistent asthma with (acute) exacerbation: Secondary | ICD-10-CM | POA: Diagnosis not present

## 2022-06-27 MED ORDER — PROMETHAZINE-DM 6.25-15 MG/5ML PO SYRP: 5.0000 mL | ORAL_SOLUTION | Freq: Four times a day (QID) | ORAL | 0 refills | Status: DC | PRN

## 2022-06-27 MED ORDER — BUDESONIDE-FORMOTEROL FUMARATE 160-4.5 MCG/ACT IN AERO: 2.0000 | INHALATION_SPRAY | Freq: Two times a day (BID) | RESPIRATORY_TRACT | 5 refills | Status: DC

## 2022-06-27 MED ORDER — PREDNISONE 20 MG PO TABS: 40.0000 mg | ORAL_TABLET | Freq: Every day | ORAL | 0 refills | Status: DC

## 2022-06-27 NOTE — ED Triage Notes (Signed)
Pt reports with a cough and rib pain due to cough x 1 week.
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
## 2022-06-28 ENCOUNTER — Other Ambulatory Visit: Payer: Self-pay | Admitting: *Deleted

## 2022-06-28 MED ORDER — FASENRA PEN 30 MG/ML ~~LOC~~ SOAJ: 30.0000 mg | SUBCUTANEOUS | 6 refills | Status: DC

## 2022-06-30 ENCOUNTER — Ambulatory Visit (HOSPITAL_COMMUNITY): Payer: 59 | Admitting: Physical Therapy

## 2022-07-01 ENCOUNTER — Telehealth (INDEPENDENT_AMBULATORY_CARE_PROVIDER_SITE_OTHER): Payer: Self-pay | Admitting: *Deleted

## 2022-07-01 NOTE — Telephone Encounter (Signed)
Pa received for symproic 0.'2mg'$ . pa completed for medcaid and got response that patient should use primary insurance. Pa completed for [employer] and it has no pa needed. I called [facility] and I spoke with trip at the pharmacy. He said [place] covers med but co pay is $400 and medicaid had been paying for the co pay up til this year. I tried to see if a savings card could be used but cannot be used since she has medicaid. I will resubmit a prior auth through [employer].

## 2022-07-04 NOTE — Telephone Encounter (Signed)
Pa faxed to [email or web address]. Await response from insurance.

## 2022-07-06 ENCOUNTER — Other Ambulatory Visit: Payer: Self-pay

## 2022-07-06 ENCOUNTER — Ambulatory Visit
Admission: EM | Admit: 2022-07-06 | Discharge: 2022-07-06 | Disposition: A | Discharge status: Home / Self Care | Payer: 59 | Attending: Family Medicine | Admitting: Family Medicine

## 2022-07-06 ENCOUNTER — Other Ambulatory Visit: Payer: Self-pay | Admitting: Adult Health

## 2022-07-06 ENCOUNTER — Ambulatory Visit: Payer: 59 | Admitting: Allergy

## 2022-07-06 ENCOUNTER — Encounter: Payer: Self-pay | Admitting: Emergency Medicine

## 2022-07-06 DIAGNOSIS — J22 Unspecified acute lower respiratory infection: Secondary | ICD-10-CM

## 2022-07-06 MED ORDER — PROMETHAZINE-DM 6.25-15 MG/5ML PO SYRP: 5.0000 mL | ORAL_SOLUTION | Freq: Four times a day (QID) | ORAL | 0 refills | Status: DC | PRN

## 2022-07-06 MED ORDER — AZITHROMYCIN 250 MG PO TABS: ORAL_TABLET | ORAL | 0 refills | Status: DC

## 2022-07-06 MED ORDER — PREDNISONE 20 MG PO TABS: 40.0000 mg | ORAL_TABLET | Freq: Every day | ORAL | 0 refills | Status: DC
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
## 2022-07-06 NOTE — ED Triage Notes (Signed)
Pt reports seen for same last week and reports cough has continued. Pt reports neck and back pain from "coughing so much."

## 2022-07-08 NOTE — Telephone Encounter (Signed)
Called [employer] at [phone] and symproci 0.'2mg'$  is approved from 07/08/22 - 07/08/23. [place] was notified.

## 2022-07-15 ENCOUNTER — Other Ambulatory Visit (INDEPENDENT_AMBULATORY_CARE_PROVIDER_SITE_OTHER): Payer: Self-pay | Admitting: Gastroenterology

## 2022-07-25 ENCOUNTER — Ambulatory Visit (INDEPENDENT_AMBULATORY_CARE_PROVIDER_SITE_OTHER): Payer: 59 | Admitting: Gastroenterology

## 2022-07-28 ENCOUNTER — Ambulatory Visit: Payer: 59 | Admitting: Allergy

## 2022-08-01 ENCOUNTER — Ambulatory Visit: Payer: 59

## 2022-08-03 ENCOUNTER — Other Ambulatory Visit: Payer: Self-pay

## 2022-08-03 MED ORDER — EPINEPHRINE 0.3 MG/0.3ML IJ SOAJ: 0.3000 mg | INTRAMUSCULAR | 0 refills | Status: DC | PRN

## 2022-08-03 NOTE — Telephone Encounter (Signed)
Patient called due to being stung by a yellow jacket. Patient used her last epi-pen and called 911. I am sending in a new epi-pen and I made an appointment for 08/04/22  for a follow up with Dr. Nelva Bush. Patient was told to got to the ER after the ambulance left. Patient refused, but I did inform her that she should definitely seek emergency help if symptoms worsen.

## 2022-08-04 ENCOUNTER — Telehealth: Payer: Self-pay

## 2022-08-04 ENCOUNTER — Encounter: Payer: Self-pay | Admitting: Allergy

## 2022-08-04 ENCOUNTER — Ambulatory Visit (INDEPENDENT_AMBULATORY_CARE_PROVIDER_SITE_OTHER): Payer: 59 | Admitting: Allergy

## 2022-08-04 ENCOUNTER — Other Ambulatory Visit: Payer: Self-pay

## 2022-08-04 VITALS — BP 116/72 | HR 89 | Temp 98.3°F | Resp 16 | Ht 66.0 in | Wt 205.3 lb

## 2022-08-04 DIAGNOSIS — K219 Gastro-esophageal reflux disease without esophagitis: Secondary | ICD-10-CM

## 2022-08-04 DIAGNOSIS — J3089 Other allergic rhinitis: Secondary | ICD-10-CM | POA: Diagnosis not present

## 2022-08-04 DIAGNOSIS — T6391XD Toxic effect of contact with unspecified venomous animal, accidental (unintentional), subsequent encounter: Secondary | ICD-10-CM | POA: Diagnosis not present

## 2022-08-04 DIAGNOSIS — J4550 Severe persistent asthma, uncomplicated: Secondary | ICD-10-CM

## 2022-08-04 DIAGNOSIS — Z720 Tobacco use: Secondary | ICD-10-CM

## 2022-08-04 MED ORDER — BUDESONIDE-FORMOTEROL FUMARATE 160-4.5 MCG/ACT IN AERO: 2.0000 | INHALATION_SPRAY | Freq: Two times a day (BID) | RESPIRATORY_TRACT | 5 refills | Status: DC

## 2022-08-04 MED ORDER — RYALTRIS 665-25 MCG/ACT NA SUSP: 2.0000 | Freq: Two times a day (BID) | NASAL | 5 refills | Status: DC | PRN

## 2022-08-04 MED ORDER — ALBUTEROL SULFATE HFA 108 (90 BASE) MCG/ACT IN AERS: 2.0000 | INHALATION_SPRAY | RESPIRATORY_TRACT | 1 refills | Status: DC | PRN

## 2022-08-04 MED ORDER — MONTELUKAST SODIUM 10 MG PO TABS: ORAL_TABLET | ORAL | 5 refills | Status: DC

## 2022-08-04 NOTE — Patient Instructions (Addendum)
Asthma Wean to Symbicort 160/4.5 mcg using 1 puff twice a day with spacer to help prevent cough and wheeze.   Continue montelukast 10 mg once a day to prevent cough and wheeze.  Continue albuterol 2 puffs every 4 hours as needed for cough or wheeze OR Instead use albuterol 0.083% solution via nebulizer one unit vial every 4 hours as needed for cough or wheeze Continue receiving Fasenra injections once every 8 weeks and have access to an epinephrine injector set.   Come next week to [facility] office for your Fasenra injections  Allergic rhinitis Continue Xyzal 5 mg once a day as needed for a runny nose.  Remember to rotate to a different antihistamine about every 3 months. Some examples of over the counter antihistamines include Zyrtec (cetirizine), Xyzal (levocetirizine), Allegra (fexofenadine), and Claritin (loratidine).  Continue montelukast Stop current nose spray and start Ryaltris 2 sprays each nostril 1-2 times a day for runny or stuffy nose  Allergic conjunctivitis Continue refresh eyedrops as recommended by your eye doctor  Reflux Continue dietary and lifestyle modifications as listed below Continue esomeprazole 40 mg once every other day as previously prescribed by your gastroenterologist  Stinging insect allergy Do your best to avoid stinging insects.  In case of an allergic reaction, take Benadryl 50 mg every 4 hours, and if life-threatening symptoms occur, inject with EpiPen 0.3 mg.  Smoking Talk with your PCP about getting back on Chantix to help with smoking cessation You have stopped before and can stop again!  Follow up in 4-6 months or sooner if needed.

## 2022-08-04 NOTE — Telephone Encounter (Signed)
Patient came in for her office visit today and informed Dr. Nelva Bush now that she is back at work, she would like to start coming to the [place] office for her Sharon Vazquez instead of [place]. Can you please look to see if she has any Fasenra in the [place] office and if so could you please bring it to the [place] office. Im including Tammy as an FYI.

## 2022-08-04 NOTE — Progress Notes (Signed)
Follow-up Note  RE: Sharon Vazquez MRN: [id number] DOB: 01/30/1981 Date of Office Visit: 08/04/2022   History of present illness: Sharon Vazquez is a 42 y.o. female presenting today for follow-up to bee sting.  She has history of asthma, allergic rhinitis, GERD, hymenoptera allergy.  Her last visit was a telemedicine visit on 11/02/21 with Dr Ernst Bowler.   She was stung yesterday she states by a yellow jacket on her right middle finger.  She states her finger was swollen and felt slight shortness of breath.  She states she did administer her epipen.  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  By time EMS arrived she was feeling better.  They did give her benadryl and recommended ED monitoring but she did not want to go to ED so she didn't.  Today she is feeling well and the swelling resolved.   She has done venom immunotherapy in the past.    She had bronchitis the first week of February where she had cough and congestion.  She did need to use her albuterol at this time.  She did go to UC and did receive antibiotic (zpak) and prednisone which helped her symptoms.   With her asthma other than illness and the reaction above she feels like she has been doing good.  She is not needing albuterol often and is wondering if she can decrease her asthma medications.  She does continue to receive Fasenra injections every 8 weeks in office.    With her regular allergies she is reporting some congestion but able to blow clear mucus out.  She continues to use xyzal daily as needed and states it still is effective.  She is on singulair daily.  She does not feel that the flonase or asteline or working for her nasal symptoms.    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  She is still smoking.  She states she did stop herself this morning and only had 2 cigarettes.  She wants to stop again.  She did successfully stop smoking for her back surgery.  She states she is going to talk with  her PCP about getting chantix.    Review of systems: Review of Systems  Constitutional: Negative.   HENT: Negative.    Eyes: Negative.   Respiratory:  Positive for shortness of breath.   Cardiovascular: Negative.   Gastrointestinal: Negative.   Musculoskeletal: Negative.   Skin:        See HPI  Allergic/Immunologic: Negative.   Neurological: Negative.      All other systems negative unless noted above in HPI  Past medical/social/surgical/family history have been reviewed and are unchanged unless specifically indicated below.  No changes  Medication List: Current Outpatient Medications  Medication Sig Dispense Refill   albuterol (PROVENTIL) (2.5 MG/3ML) 0.083% nebulizer solution Take 3 mLs (2.5 mg total) by nebulization every 4 (four) hours as needed for wheezing or shortness of breath. 75 mL 1   alprazolam (XANAX) 2 MG tablet Take 1-2 mg by mouth 2 (two) times daily as needed for anxiety.     Benralizumab (FASENRA PEN) 30 MG/ML SOAJ Inject 1 mL (30 mg total) into the skin every 8 (eight) weeks. 1 mL 6   cyclobenzaprine (FLEXERIL) 10 MG tablet Take 1 tablet (10 mg total) by mouth 2 (two) times daily as needed for muscle spasms. (Patient taking differently: Take 10 mg by mouth 3 (three) times daily.) 20 tablet 0  diclofenac Sodium (VOLTAREN) 1 % GEL Apply 1 Application topically 3 (three) times daily as needed (pain).     EPINEPHrine 0.3 mg/0.3 mL IJ SOAJ injection Inject 0.3 mg into the muscle as needed for anaphylaxis. 2 each 0   esomeprazole (NEXIUM) 40 MG capsule TAKE 1 CAPSULE ONCE DAILY BEFORE BREAKFAST. 90 capsule 1   fluconazole (DIFLUCAN) 150 MG tablet Take 1 now and 1 in 3 days. 2 tablet 0   IBU 800 MG tablet TAKE 1 TABLET EVERY SIX HOURS AS NEEDED FOR PAIN.     lamoTRIgine (LAMICTAL) 200 MG tablet Take 200 mg by mouth daily.     metoprolol succinate (TOPROL-XL) 100 MG 24 hr tablet Take 100 mg by mouth daily.      mirtazapine (REMERON SOL-TAB) 45 MG disintegrating tablet  Take 45 mg by mouth at bedtime as needed (sleep).     Naldemedine Tosylate (SYMPROIC) 0.2 MG TABS Take 1 each by mouth daily. 90 tablet 3   nystatin (MYCOSTATIN/NYSTOP) powder Apply 1 application topically 3 (three) times daily. (Patient taking differently: Apply 1 application  topically 3 (three) times daily as needed (rash).) 60 g 12   Olopatadine-Mometasone (RYALTRIS) 665-25 MCG/ACT SUSP Place 2 sprays into the nose 2 (two) times daily as needed. 29 g 5   ondansetron (ZOFRAN) 4 MG tablet Take 1 tablet (4 mg total) by mouth every 8 (eight) hours as needed for nausea or vomiting. 20 tablet 0   Oxycodone HCl 10 MG TABS Take 10 mg by mouth 6 (six) times daily.     Plecanatide (TRULANCE) 3 MG TABS TAKE ONE TABLET BY MOUTH ONCE DAILY AS NEEDED. 30 tablet 5   pregabalin (LYRICA) 150 MG capsule Take 150 mg by mouth 2 (two) times daily.     Prenatal Vit-Fe Fumarate-FA (M-NATAL PLUS) 27-1 MG TABS TAKE (1) TABLET BY MOUTH ONCE DAILY. 30 tablet 12   RESTASIS 0.05 % ophthalmic emulsion Place 1 drop into both eyes 2 (two) times daily.     SUMAtriptan (IMITREX) 50 MG tablet Take 50 mg by mouth every 2 (two) hours as needed for migraine.     zolpidem (AMBIEN) 10 MG tablet Take 10 mg by mouth at bedtime as needed for sleep.     albuterol (VENTOLIN HFA) 108 (90 Base) MCG/ACT inhaler Inhale 2 puffs into the lungs every 4 (four) hours as needed for wheezing or shortness of breath. 18 g 1   budesonide-formoterol (SYMBICORT) 160-4.5 MCG/ACT inhaler Inhale 2 puffs into the lungs in the morning and at bedtime. 1 each 5   montelukast (SINGULAIR) 10 MG tablet TAKE (1) TABLET BY MOUTH AT BEDTIME. 30 tablet 5   ondansetron (ZOFRAN ODT) 4 MG disintegrating tablet Take 1 tablet (4 mg total) by mouth every 8 (eight) hours as needed for nausea or vomiting. (Patient not taking: Reported on 08/04/2022) 20 tablet 0   Semaglutide-Weight Management 0.5 MG/0.5ML SOAJ Inject 0.5 mg into the skin every Wednesday. (Patient not taking:  Reported on 08/04/2022)     Current Facility-Administered Medications  Medication Dose Route Frequency Provider Last Rate Last Admin   Benralizumab SOSY 30 mg  30 mg Subcutaneous Q8 Weeks Kennith Gain, MD   30 mg at 06/03/22 0901     Known medication allergies: Allergies  Allergen Reactions   Bee Venom Anaphylaxis   Dilaudid [Hydromorphone Hcl] Anaphylaxis and Hives    Has tolerated morphine since this reaction with Benadryl   Senna Anaphylaxis and Hives   Adhesive [Tape] Hives and Other (  See Comments)    Pulls skin off (use paper tape)   Latex Hives     Physical examination: Blood pressure 116/72, pulse 89, temperature 98.3 F (36.8 C), resp. rate 16, height '5\' 6"'$  (1.676 m), weight 205 lb 4.8 oz (93.1 kg), SpO2 96 %.  General: Alert, interactive, in no acute distress. HEENT: PERRLA, TMs pearly gray, turbinates mildly edematous without discharge, post-pharynx non erythematous. Neck: Supple without lymphadenopathy. Lungs: Clear to auscultation without wheezing, rhonchi or rales. {no increased work of breathing. CV: Normal S1, S2 without murmurs. Abdomen: Nondistended, nontender. Skin: Right middle finger with pinpoint erythema without swelling . Extremities:  No clubbing, cyanosis or edema. Neuro:   Grossly intact.  Diagnositics/Labs: None today   Assessment and plan: Asthma Wean to Symbicort 160/4.5 mcg using 1 puff twice a day with spacer to help prevent cough and wheeze.  If noting increased symptoms then will resume 2 puff twice a day use.  Continue montelukast 10 mg once a day to prevent cough and wheeze.  Continue albuterol 2 puffs every 4 hours as needed for cough or wheeze OR Instead use albuterol 0.083% solution via nebulizer one unit vial every 4 hours as needed for cough or wheeze Continue receiving Fasenra injections once every 8 weeks and have access to an epinephrine injector set.   Come next [facility] to [facility] office for your Fasenra  injections  Allergic rhinitis Continue Xyzal 5 mg once a day as needed for a runny nose.  Remember to rotate to a different antihistamine about every 3 months. Some examples of over the counter antihistamines include Zyrtec (cetirizine), Xyzal (levocetirizine), Allegra (fexofenadine), and Claritin (loratidine).  Continue montelukast Stop current nose spray and start Ryaltris 2 sprays each nostril 1-2 times a day for runny or stuffy nose  Allergic conjunctivitis Continue refresh eyedrops as recommended by your eye doctor  Reflux Continue dietary and lifestyle modifications as listed below Continue esomeprazole 40 mg once every other day as previously prescribed by your gastroenterologist  Stinging insect allergy Do your best to avoid stinging insects.  In case of an allergic reaction, take Benadryl 50 mg every 4 hours, and if life-threatening symptoms occur, inject with EpiPen 0.3 mg.  Smoking Talk with your PCP about getting back on Chantix to help with smoking cessation You have stopped before and can stop again!  Follow up in 4-6 months or sooner if needed.  I appreciate the opportunity to take part in Thaily's care. Please do not hesitate to contact me with questions.  Sincerely,   Prudy Feeler, MD Allergy/Immunology [facility]

## 2022-08-08 NOTE — Telephone Encounter (Signed)
I have submitted her reapproval also

## 2022-08-18 ENCOUNTER — Encounter: Payer: Self-pay | Admitting: Allergy

## 2022-08-18 ENCOUNTER — Ambulatory Visit (INDEPENDENT_AMBULATORY_CARE_PROVIDER_SITE_OTHER): Payer: 59

## 2022-08-18 DIAGNOSIS — J4550 Severe persistent asthma, uncomplicated: Secondary | ICD-10-CM

## 2022-09-02 ENCOUNTER — Telehealth: Payer: 59 | Admitting: Family Medicine

## 2022-09-02 ENCOUNTER — Telehealth: Payer: 59 | Admitting: Physician Assistant

## 2022-09-02 DIAGNOSIS — G8929 Other chronic pain: Secondary | ICD-10-CM

## 2022-09-02 DIAGNOSIS — M545 Low back pain, unspecified: Secondary | ICD-10-CM | POA: Diagnosis not present

## 2022-09-02 MED ORDER — PREDNISONE 20 MG PO TABS: 20.0000 mg | ORAL_TABLET | Freq: Two times a day (BID) | ORAL | 0 refills | Status: AC

## 2022-09-02 NOTE — Progress Notes (Signed)
Requesting work note.   No charge. Already seen earlier.

## 2022-09-02 NOTE — Progress Notes (Signed)
Virtual Visit Consent   [place], you are scheduled for a virtual visit with a [facility] provider today. Just as with appointments in the office, your consent must be obtained to participate. Your consent will be active for this visit and any virtual visit you may have with one of our providers in the next 365 days. If you have a MyChart account, a copy of this consent can be sent to you electronically.  As this is a virtual visit, video technology does not allow for your provider to perform a traditional examination. This may limit your provider's ability to fully assess your condition. If your provider identifies any concerns that need to be evaluated in person or the need to arrange testing (such as labs, EKG, etc.), we will make arrangements to do so. Although advances in technology are sophisticated, we cannot ensure that it will always work on either your end or our end. If the connection with a video visit is poor, the visit may have to be switched to a telephone visit. With either a video or telephone visit, we are not always able to ensure that we have a secure connection.  By engaging in this virtual visit, you consent to the provision of healthcare and authorize for your insurance to be billed (if applicable) for the services provided during this visit. Depending on your insurance coverage, you may receive a charge related to this service.  I need to obtain your verbal consent now. Are you willing to proceed with your visit today? Sharon Vazquez has provided verbal consent on 09/02/2022 for a virtual visit (video or telephone). Dellia Nims, FNP  Date: 09/02/2022 9:05 AM  Virtual Visit via Video Note   I, Dellia Nims, connected with  Sharon Vazquez  ([id number], 1980/10/26) on 09/02/22 at  9:15 AM EDT by a video-enabled telemedicine application and verified that I am speaking with the correct person using two identifiers.  Location: Patient: Virtual Visit Location Patient:  Home Provider: Virtual Visit Location Provider: Home Office   I discussed the limitations of evaluation and management by telemedicine and the availability of in person appointments. The patient expressed understanding and agreed to proceed.    History of Present Illness: Sharon Vazquez is a 42 y.o. who identifies as a female who was assigned female at birth, and is being seen today for low back pain without radiculopathy, no urinary sx, history of multiple back surgeries followed by pain mgmt. Using heat. Marland Kitchen  HPI: HPI  Problems:  Patient Active Problem List   Diagnosis Date Noted   Chronic prescription opiate use 03/21/2022   Blurred vision 03/03/2021   Encounter for gynecological examination with Papanicolaou smear of cervix 01/29/2021   Encounter for screening fecal occult blood testing 01/29/2021   Patient desires pregnancy 01/29/2021   Allergic conjunctivitis of both eyes 09/29/2020   Acute non-recurrent maxillary sinusitis 09/29/2020   History of colonic polyps 07/21/2020   COVID-19 06/16/2020   Internal and external bleeding hemorrhoids 02/19/2020   Migraine 02/05/2020   Primary osteoarthritis of right shoulder 01/15/2020   Dysfunction of right rotator cuff 01/15/2020   Chronic pain syndrome 01/15/2020   Nipple discharge 03/25/2019   History of lumbar fusion (x6 lumbar spine surgeries, L3-L5 PLIF) 02/28/2019   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   Post laminectomy syndrome 02/28/2019   Chronic radicular lumbar pain 02/28/2019   Tobacco use disorder 02/12/2019   Lumbar spondylosis 01/17/2019   Lumbar facet arthropathy 01/17/2019  Need for hepatitis C screening test 01/15/2019   Chest tightness 07/09/2018   Hypokalemia 07/09/2018   Syncope 07/09/2018   Breast pain 06/19/2018   Closed fracture of left zygomatic arch ([place]) 05/11/2018   URI (upper respiratory infection) 02/26/2018   Night sweats 01/24/2018   Morbid  obesity ([place]) 09/15/2017   Obesity 09/15/2017   Allergic rhinitis due to allergen 08/22/2017   Allergic conjunctivitis 08/22/2017   Moderate persistent asthma without complication [id number]   Allergic rhinitis 08/22/2017   Hematuria 08/08/2017   Polypharmacy 08/08/2017   Post concussion syndrome 08/08/2017   Prediabetes 08/08/2017   Anxiety 06/08/2017   Arthritis 04/26/2017   Asthma 04/26/2017   Developmental disorder 04/26/2017   Gastroesophageal reflux disease 04/26/2017   Moderate persistent asthma 04/26/2017   Essential hypertension 04/26/2017   Well female exam with routine gynecological exam 04/17/2017   Urinary incontinence 04/17/2017   Toxic effect of venom 10/10/2016   Radiculopathy 04/06/2016   Depression 12/05/2015   Bipolar 2 disorder, major depressive episode ([place]) 12/05/2015   Intractable pain 12/17/2014   Mass of axillary tail of right breast 08/26/2014   Chronic right shoulder pain 06/13/2014   Lumbar radiculopathy 04/23/2014   Irregular periods 04/02/2014   Abdominal pain 11/05/2013   Bipolar 1 disorder ([place]) 11/05/2013   Chronic pain disorder 11/05/2013   Displacement of lumbar intervertebral disc without myelopathy 11/01/2013   Contraceptive management 08/23/2013   Superficial fungus infection of skin 08/23/2013   HNP (herniated nucleus pulposus), lumbar 07/03/2013   Right ovarian cyst 01/22/2013   Chronic constipation 12/17/2012   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   Unspecified essential hypertension 08/07/2012   Unspecified constipation 08/07/2012   Bipolar disorder, unspecified ([place]) 08/07/2012   Attention deficit hyperactivity disorder 08/07/2012   Panic disorder 08/07/2012   Rectal bleeding 08/07/2012   Abdominal pain, right upper quadrant 08/07/2012    Allergies:  Allergies  Allergen Reactions   Bee Venom Anaphylaxis   Dilaudid [Hydromorphone Hcl] Anaphylaxis and Hives    Has tolerated morphine since this reaction with  Benadryl   Senna Anaphylaxis and Hives   Adhesive [Tape] Hives and Other (See Comments)    Pulls skin off (use paper tape)   Latex Hives   Medications:  Current Outpatient Medications:    predniSONE (DELTASONE) 20 MG tablet, Take 1 tablet (20 mg total) by mouth 2 (two) times daily with a meal for 5 days., Disp: 10 tablet, Rfl: 0   albuterol (PROVENTIL) (2.5 MG/3ML) 0.083% nebulizer solution, Take 3 mLs (2.5 mg total) by nebulization every 4 (four) hours as needed for wheezing or shortness of breath., Disp: 75 mL, Rfl: 1   albuterol (VENTOLIN HFA) 108 (90 Base) MCG/ACT inhaler, Inhale 2 puffs into the lungs every 4 (four) hours as needed for wheezing or shortness of breath., Disp: 18 g, Rfl: 1   alprazolam (XANAX) 2 MG tablet, Take 1-2 mg by mouth 2 (two) times daily as needed for anxiety., Disp: , Rfl:    Benralizumab (FASENRA PEN) 30 MG/ML SOAJ, Inject 1 mL (30 mg total) into the skin every 8 (eight) weeks., Disp: 1 mL, Rfl: 6   budesonide-formoterol (SYMBICORT) 160-4.5 MCG/ACT inhaler, Inhale 2 puffs into the lungs in the morning and at bedtime., Disp: 1 each, Rfl: 5   cyclobenzaprine (FLEXERIL) 10 MG tablet, Take 1 tablet (10 mg total) by mouth 2 (two) times daily as needed for muscle spasms. (Patient taking differently: Take 10 mg by mouth 3 (three) times daily.), Disp: 20 tablet,  Rfl: 0   diclofenac Sodium (VOLTAREN) 1 % GEL, Apply 1 Application topically 3 (three) times daily as needed (pain)., Disp: , Rfl:    EPINEPHrine 0.3 mg/0.3 mL IJ SOAJ injection, Inject 0.3 mg into the muscle as needed for anaphylaxis., Disp: 2 each, Rfl: 0   esomeprazole (NEXIUM) 40 MG capsule, TAKE 1 CAPSULE ONCE DAILY BEFORE BREAKFAST., Disp: 90 capsule, Rfl: 1   fluconazole (DIFLUCAN) 150 MG tablet, Take 1 now and 1 in 3 days., Disp: 2 tablet, Rfl: 0   IBU 800 MG tablet, TAKE 1 TABLET EVERY SIX HOURS AS NEEDED FOR PAIN., Disp: , Rfl:    lamoTRIgine (LAMICTAL) 200 MG tablet, Take 200 mg by mouth daily., Disp: ,  Rfl:    metoprolol succinate (TOPROL-XL) 100 MG 24 hr tablet, Take 100 mg by mouth daily. , Disp: , Rfl:    mirtazapine (REMERON SOL-TAB) 45 MG disintegrating tablet, Take 45 mg by mouth at bedtime as needed (sleep)., Disp: , Rfl:    montelukast (SINGULAIR) 10 MG tablet, TAKE (1) TABLET BY MOUTH AT BEDTIME., Disp: 30 tablet, Rfl: 5   Naldemedine Tosylate (SYMPROIC) 0.2 MG TABS, Take 1 each by mouth daily., Disp: 90 tablet, Rfl: 3   nystatin (MYCOSTATIN/NYSTOP) powder, Apply 1 application topically 3 (three) times daily. (Patient taking differently: Apply 1 application  topically 3 (three) times daily as needed (rash).), Disp: 60 g, Rfl: 12   Olopatadine-Mometasone (RYALTRIS) 665-25 MCG/ACT SUSP, Place 2 sprays into the nose 2 (two) times daily as needed., Disp: 29 g, Rfl: 5   ondansetron (ZOFRAN ODT) 4 MG disintegrating tablet, Take 1 tablet (4 mg total) by mouth every 8 (eight) hours as needed for nausea or vomiting. (Patient not taking: Reported on 08/04/2022), Disp: 20 tablet, Rfl: 0   ondansetron (ZOFRAN) 4 MG tablet, Take 1 tablet (4 mg total) by mouth every 8 (eight) hours as needed for nausea or vomiting., Disp: 20 tablet, Rfl: 0   Oxycodone HCl 10 MG TABS, Take 10 mg by mouth 6 (six) times daily., Disp: , Rfl:    Plecanatide (TRULANCE) 3 MG TABS, TAKE ONE TABLET BY MOUTH ONCE DAILY AS NEEDED., Disp: 30 tablet, Rfl: 5   pregabalin (LYRICA) 150 MG capsule, Take 150 mg by mouth 2 (two) times daily., Disp: , Rfl:    Prenatal Vit-Fe Fumarate-FA (M-NATAL PLUS) 27-1 MG TABS, TAKE (1) TABLET BY MOUTH ONCE DAILY., Disp: 30 tablet, Rfl: 12   RESTASIS 0.05 % ophthalmic emulsion, Place 1 drop into both eyes 2 (two) times daily., Disp: , Rfl:    Semaglutide-Weight Management 0.5 MG/0.5ML SOAJ, Inject 0.5 mg into the skin every Wednesday. (Patient not taking: Reported on 08/04/2022), Disp: , Rfl:    SUMAtriptan (IMITREX) 50 MG tablet, Take 50 mg by mouth every 2 (two) hours as needed for migraine., Disp: ,  Rfl:    zolpidem (AMBIEN) 10 MG tablet, Take 10 mg by mouth at bedtime as needed for sleep., Disp: , Rfl:   Current Facility-Administered Medications:    Benralizumab SOSY 30 mg, 30 mg, Subcutaneous, Q8 Weeks, Padgett, Rae Halsted, MD, 30 mg at 08/18/22 1346  Observations/Objective: Patient is well-developed, well-nourished in no acute distress.  Resting in car at work with seat heater on for comfort.  Head is normocephalic, atraumatic.  No labored breathing.  Speech is clear and coherent with logical content.  Patient is alert and oriented at baseline.    Assessment and Plan: 1. Chronic low back pain without sciatica, unspecified back pain laterality Heat,  continue muscle relaxants. She has oxycodone from pain mgmt- ED if sx worsen.   Follow Up Instructions: I discussed the assessment and treatment plan with the patient. The patient was provided an opportunity to ask questions and all were answered. The patient agreed with the plan and demonstrated an understanding of the instructions.  A copy of instructions were sent to the patient via MyChart unless otherwise noted below.     The patient was advised to call back or seek an in-person evaluation if the symptoms worsen or if the condition fails to improve as anticipated.  Time:  I spent 10 minutes with the patient via telehealth technology discussing the above problems/concerns.    Dellia Nims, FNP

## 2022-09-02 NOTE — Patient Instructions (Signed)
Lumbosacral Strain A lumbosacral strain is an injury that causes pain in the lower back (lumbosacral spine). This injury usually happens from overstretching the muscles or ligaments along the spine. Ligaments are cord-like tissues that connect bones to each other. A strain can affect one or more muscles or ligaments. What are the causes? This condition may be caused by: A hard, direct hit to the back. Overstretching the lower back muscles. This may result from: A fall. Lifting something heavy. Repeated movements such as bending or crouching. What increases the risk? The following factors make you more likely to have a lumbosacral strain: Taking part in sports or activities that involve: A sudden twist of the back. Pushing or pulling motions. Being overweight or obese. Having poor strength and flexibility, especially tight hamstrings or weak muscles in the back or abdomen. Having too much of a curve in the lower back. Having a pelvis that is tilted forward. What are the signs or symptoms? The main symptom of this condition is pain in the lower back, at the site of the strain. Pain may also be felt down one or both legs. How is this diagnosed? This condition is diagnosed based on: Your symptoms and medical history. A physical exam. During the exam: Your health care provider may push on certain areas of your back to find the source of your pain. You may be asked to bend forward, backward, and side to side to check your pain and range of motion. You may also have imaging tests, such as X-rays and an MRI. How is this treated? This condition may be treated by: Applying heat and cold to the affected area. Taking medicines for pain and to relax your muscles. Taking NSAIDs, such as ibuprofen, to help reduce swelling and discomfort. Doing stretching and strengthening exercises for your lower back. Symptoms usually improve within several weeks of treatment. But recovery time varies. When your  symptoms improve, gradually return to your normal routine as soon as possible. This will help reduce pain, avoid stiffness, and keep muscle strength. Follow these instructions at home: Medicines Take over-the-counter and prescription medicines only as told by your health care provider. Ask your health care provider if the medicine prescribed to you: Requires you to avoid driving or using machinery. Can cause constipation. You may need to take these actions to prevent or treat constipation: Drink enough fluid to keep your urine pale yellow. Take over-the-counter or prescription medicines. Eat foods that are high in fiber, such as beans, whole grains, and fresh fruits and vegetables. Limit foods that are high in fat and processed sugars, such as fried or sweet foods. Managing pain, stiffness, and swelling     If told, put ice on the injured area. Put ice in a plastic bag. Place a towel between your skin and the bag. Leave the ice on for 20 minutes, 2-3 times a day. If told, apply heat to the affected area as often as told by your health care provider. Use the heat source that your health care provider recommends, such as a moist heat pack or a heating pad. Place a towel between your skin and the heat source. Leave the heat on for 20-30 minutes. If your skin turns bright red, remove the heat or ice right away to prevent skin damage. The risk of damage is higher if you cannot feel pain, heat, or cold. Activity Rest as told by your health care provider. Do not stay in bed. Staying in bed for more than 1-2 days   can delay your recovery. Return to your normal activities as told by your health care provider. Ask your health care provider what activities are safe for you. Avoid activities that take a lot of energy for as long as told by your health care provider. Do exercises as told by your health care provider. This includes stretching and strengthening exercises. General instructions      Use good posture when sitting and standing. Avoid leaning forward when you sit, and avoid hunching over when you stand. Do not use any products that contain nicotine or tobacco. These products include cigarettes, chewing tobacco, and vaping devices, such as e-cigarettes. If you need help quitting, ask your health care provider. How is this prevented?  Warm up properly before physical activity, and cool down and stretch after being active. Use correct form when playing sports. Bend your knees and use correct posture when lifting heavy objects. Maintain a healthy weight. Sleep on a mattress with medium firmness to support your back. Do at least 150 minutes of moderate-intensity exercise each week, such as brisk walking or water aerobics. Try a form of exercise that takes stress off your back, such as swimming or stationary cycling. Maintain physical fitness, including: Strength. Flexibility. Contact a health care provider if: Your back pain does not improve after several weeks of treatment. Your symptoms get worse. You have a fever. Get help right away if: Your back pain is severe. You cannot stand or walk. You feel nauseous or you vomit. You develop any of the following: Trouble controlling when you urinate or when you have a bowel movement. Pain in your legs. Your feet or legs get very cold, turn pale, or look blue. Weakness in your buttocks or legs. This information is not intended to replace advice given to you by your health care provider. Make sure you discuss any questions you have with your health care provider. Document Revised: 12/14/2021 Document Reviewed: 12/14/2021 Elsevier Patient Education  2023 Elsevier Inc.  

## 2022-09-02 NOTE — Patient Instructions (Signed)
[facility], thank you for joining Mar Daring, PA-C for today's virtual visit.  While this provider is not your primary care provider (PCP), if your PCP is located in our provider database this encounter information will be shared with them immediately following your visit.   [facility] account gives you access to today's visit and all your visits, tests, and labs performed at [facility] " click here if you don't have a [facility] account or go to [email or web address]  Consent: (Patient) Sharon Vazquez provided verbal consent for this virtual visit at the beginning of the encounter.  Current Medications:  Current Outpatient Medications:    albuterol (PROVENTIL) (2.5 MG/3ML) 0.083% nebulizer solution, Take 3 mLs (2.5 mg total) by nebulization every 4 (four) hours as needed for wheezing or shortness of breath., Disp: 75 mL, Rfl: 1   albuterol (VENTOLIN HFA) 108 (90 Base) MCG/ACT inhaler, Inhale 2 puffs into the lungs every 4 (four) hours as needed for wheezing or shortness of breath., Disp: 18 g, Rfl: 1   alprazolam (XANAX) 2 MG tablet, Take 1-2 mg by mouth 2 (two) times daily as needed for anxiety., Disp: , Rfl:    Benralizumab (FASENRA PEN) 30 MG/ML SOAJ, Inject 1 mL (30 mg total) into the skin every 8 (eight) weeks., Disp: 1 mL, Rfl: 6   budesonide-formoterol (SYMBICORT) 160-4.5 MCG/ACT inhaler, Inhale 2 puffs into the lungs in the morning and at bedtime., Disp: 1 each, Rfl: 5   cyclobenzaprine (FLEXERIL) 10 MG tablet, Take 1 tablet (10 mg total) by mouth 2 (two) times daily as needed for muscle spasms. (Patient taking differently: Take 10 mg by mouth 3 (three) times daily.), Disp: 20 tablet, Rfl: 0   diclofenac Sodium (VOLTAREN) 1 % GEL, Apply 1 Application topically 3 (three) times daily as needed (pain)., Disp: , Rfl:    EPINEPHrine 0.3 mg/0.3 mL IJ SOAJ injection, Inject 0.3 mg into the muscle as needed for anaphylaxis., Disp: 2 each,  Rfl: 0   esomeprazole (NEXIUM) 40 MG capsule, TAKE 1 CAPSULE ONCE DAILY BEFORE BREAKFAST., Disp: 90 capsule, Rfl: 1   fluconazole (DIFLUCAN) 150 MG tablet, Take 1 now and 1 in 3 days., Disp: 2 tablet, Rfl: 0   IBU 800 MG tablet, TAKE 1 TABLET EVERY SIX HOURS AS NEEDED FOR PAIN., Disp: , Rfl:    lamoTRIgine (LAMICTAL) 200 MG tablet, Take 200 mg by mouth daily., Disp: , Rfl:    metoprolol succinate (TOPROL-XL) 100 MG 24 hr tablet, Take 100 mg by mouth daily. , Disp: , Rfl:    mirtazapine (REMERON SOL-TAB) 45 MG disintegrating tablet, Take 45 mg by mouth at bedtime as needed (sleep)., Disp: , Rfl:    montelukast (SINGULAIR) 10 MG tablet, TAKE (1) TABLET BY MOUTH AT BEDTIME., Disp: 30 tablet, Rfl: 5   Naldemedine Tosylate (SYMPROIC) 0.2 MG TABS, Take 1 each by mouth daily., Disp: 90 tablet, Rfl: 3   nystatin (MYCOSTATIN/NYSTOP) powder, Apply 1 application topically 3 (three) times daily. (Patient taking differently: Apply 1 application  topically 3 (three) times daily as needed (rash).), Disp: 60 g, Rfl: 12   Olopatadine-Mometasone (RYALTRIS) 665-25 MCG/ACT SUSP, Place 2 sprays into the nose 2 (two) times daily as needed., Disp: 29 g, Rfl: 5   ondansetron (ZOFRAN ODT) 4 MG disintegrating tablet, Take 1 tablet (4 mg total) by mouth every 8 (eight) hours as needed for nausea or vomiting. (Patient not taking: Reported on 08/04/2022), Disp: 20 tablet, Rfl: 0   ondansetron (ZOFRAN) 4  MG tablet, Take 1 tablet (4 mg total) by mouth every 8 (eight) hours as needed for nausea or vomiting., Disp: 20 tablet, Rfl: 0   Oxycodone HCl 10 MG TABS, Take 10 mg by mouth 6 (six) times daily., Disp: , Rfl:    Plecanatide (TRULANCE) 3 MG TABS, TAKE ONE TABLET BY MOUTH ONCE DAILY AS NEEDED., Disp: 30 tablet, Rfl: 5   predniSONE (DELTASONE) 20 MG tablet, Take 1 tablet (20 mg total) by mouth 2 (two) times daily with a meal for 5 days., Disp: 10 tablet, Rfl: 0   pregabalin (LYRICA) 150 MG capsule, Take 150 mg by mouth 2 (two)  times daily., Disp: , Rfl:    Prenatal Vit-Fe Fumarate-FA (M-NATAL PLUS) 27-1 MG TABS, TAKE (1) TABLET BY MOUTH ONCE DAILY., Disp: 30 tablet, Rfl: 12   RESTASIS 0.05 % ophthalmic emulsion, Place 1 drop into both eyes 2 (two) times daily., Disp: , Rfl:    Semaglutide-Weight Management 0.5 MG/0.5ML SOAJ, Inject 0.5 mg into the skin every Wednesday. (Patient not taking: Reported on 08/04/2022), Disp: , Rfl:    SUMAtriptan (IMITREX) 50 MG tablet, Take 50 mg by mouth every 2 (two) hours as needed for migraine., Disp: , Rfl:    zolpidem (AMBIEN) 10 MG tablet, Take 10 mg by mouth at bedtime as needed for sleep., Disp: , Rfl:   Current Facility-Administered Medications:    Benralizumab SOSY 30 mg, 30 mg, Subcutaneous, Q8 Weeks, Padgett, Rae Halsted, MD, 30 mg at 08/18/22 1346   Medications ordered in this encounter:  No orders of the defined types were placed in this encounter.    *If you need refills on other medications prior to your next appointment, please contact your pharmacy*  Follow-Up: Call back or seek an in-person evaluation if the symptoms worsen or if the condition fails to improve as anticipated.  [place] [phone]  Other Instructions  Back Exercises The following exercises strengthen the muscles that help to support the trunk (torso) and back. They also help to keep the lower back flexible. Doing these exercises can help to prevent or lessen existing low back pain. If you have back pain or discomfort, try doing these exercises 2-3 times each day or as told by your health care provider. As your pain improves, do them once each day, but increase the number of times that you repeat the steps for each exercise (do more repetitions). To prevent the recurrence of back pain, continue to do these exercises once each day or as told by your health care provider. Do exercises exactly as told by your health care provider and adjust them as directed. It is normal to  feel mild stretching, pulling, tightness, or discomfort as you do these exercises, but you should stop right away if you feel sudden pain or your pain gets worse. Exercises Single knee to chest Repeat these steps 3-5 times for each leg: Lie on your back on a firm bed or the floor with your legs extended. Bring one knee to your chest. Your other leg should stay extended and in contact with the floor. Hold your knee in place by grabbing your knee or thigh with both hands and hold. Pull on your knee until you feel a gentle stretch in your lower back or buttocks. Hold the stretch for 10-30 seconds. Slowly release and straighten your leg.  Pelvic tilt Repeat these steps 5-10 times: Lie on your back on a firm bed or the floor with your legs extended. [employer]  your knees so they are pointing toward the ceiling and your feet are flat on the floor. Tighten your lower abdominal muscles to press your lower back against the floor. This motion will tilt your pelvis so your tailbone points up toward the ceiling instead of pointing to your feet or the floor. With gentle tension and even breathing, hold this position for 5-10 seconds.  Cat-cow Repeat these steps until your lower back becomes more flexible: Get into a hands-and-knees position on a firm bed or the floor. Keep your hands under your shoulders, and keep your knees under your hips. You may place padding under your knees for comfort. Let your head hang down toward your chest. Contract your abdominal muscles and point your tailbone toward the floor so your lower back becomes rounded like the back of a cat. Hold this position for 5 seconds. Slowly lift your head, let your abdominal muscles relax, and point your tailbone up toward the ceiling so your back forms a sagging arch like the back of a cow. Hold this position for 5 seconds.  Press-ups Repeat these steps 5-10 times: Lie on your abdomen (face-down) on a firm bed or the floor. Place your palms  near your head, about shoulder-width apart. Keeping your back as relaxed as possible and keeping your hips on the floor, slowly straighten your arms to raise the top half of your body and lift your shoulders. Do not use your back muscles to raise your upper torso. You may adjust the placement of your hands to make yourself more comfortable. Hold this position for 5 seconds while you keep your back relaxed. Slowly return to lying flat on the floor.  Bridges Repeat these steps 10 times: Lie on your back on a firm bed or the floor. Bend your knees so they are pointing toward the ceiling and your feet are flat on the floor. Your arms should be flat at your sides, next to your body. Tighten your buttocks muscles and lift your buttocks off the floor until your waist is at almost the same height as your knees. You should feel the muscles working in your buttocks and the back of your thighs. If you do not feel these muscles, slide your feet 1-2 inches (2.5-5 cm) farther away from your buttocks. Hold this position for 3-5 seconds. Slowly lower your hips to the starting position, and allow your buttocks muscles to relax completely. If this exercise is too easy, try doing it with your arms crossed over your chest. Abdominal crunches Repeat these steps 5-10 times: Lie on your back on a firm bed or the floor with your legs extended. Bend your knees so they are pointing toward the ceiling and your feet are flat on the floor. Cross your arms over your chest. Tip your chin slightly toward your chest without bending your neck. Tighten your abdominal muscles and slowly raise your torso high enough to lift your shoulder blades a tiny bit off the floor. Avoid raising your torso higher than that because it can put too much stress on your lower back and does not help to strengthen your abdominal muscles. Slowly return to your starting position.  Back lifts Repeat these steps 5-10 times: Lie on your abdomen  (face-down) with your arms at your sides, and rest your forehead on the floor. Tighten the muscles in your legs and your buttocks. Slowly lift your chest off the floor while you keep your hips pressed to the floor. Keep the back of your head  in line with the curve in your back. Your eyes should be looking at the floor. Hold this position for 3-5 seconds. Slowly return to your starting position.  Contact a health care provider if: Your back pain or discomfort gets much worse when you do an exercise. Your worsening back pain or discomfort does not lessen within 2 hours after you exercise. If you have any of these problems, stop doing these exercises right away. Do not do them again unless your health care provider says that you can. Get help right away if: You develop sudden, severe back pain. If this happens, stop doing the exercises right away. Do not do them again unless your health care provider says that you can. This information is not intended to replace advice given to you by your health care provider. Make sure you discuss any questions you have with your health care provider. Document Revised: 11/17/2020 Document Reviewed: 08/05/2020 Elsevier Patient Education  [place].    If you have been instructed to have an in-person evaluation today at a local Urgent Care facility, please use the link below. It will take you to a list of all of our available [facility], including address, phone number and hours of operation. Please do not delay care.  [facility]  If you or a family member do not have a primary care provider, use the link below to schedule a visit and establish care. When you choose a [facility] primary care physician or advanced practice provider, you gain a long-term partner in health. Find a Primary Care Provider  Learn more about [facility] in-office and virtual care options: [facility]

## 2022-09-08 ENCOUNTER — Encounter: Payer: Self-pay | Admitting: Podiatry

## 2022-09-08 ENCOUNTER — Ambulatory Visit (INDEPENDENT_AMBULATORY_CARE_PROVIDER_SITE_OTHER): Payer: 59

## 2022-09-08 ENCOUNTER — Ambulatory Visit (INDEPENDENT_AMBULATORY_CARE_PROVIDER_SITE_OTHER): Payer: 59 | Admitting: Podiatry

## 2022-09-08 DIAGNOSIS — M21611 Bunion of right foot: Secondary | ICD-10-CM | POA: Diagnosis not present

## 2022-09-08 DIAGNOSIS — M21619 Bunion of unspecified foot: Secondary | ICD-10-CM

## 2022-09-08 DIAGNOSIS — Z472 Encounter for removal of internal fixation device: Secondary | ICD-10-CM

## 2022-09-09 NOTE — Progress Notes (Signed)
Subjective:   Patient ID: Sharon Vazquez, female   DOB: 42 y.o.   MRN: [id number]   HPI Patient presents with a knot on top of the right foot that is been sore stating she is done great with her bunion surgery very pleased but this spot has become tender recently   ROS      Objective:  Physical Exam  Neurovascular status intact negative Denna Haggard' sign noted right first metatarsal prominent proximal area which most likely represents pin which may have backed out slightly during the healing process     Assessment:  Abnormal pin position right first metatarsal     Plan:  Reviewed condition recommended nail removal discussed procedure explained to her and allowed her to read and signed consent form understanding procedure risk which will be done in the office.  Patient wants surgery signed consent form scheduled for office surgery for removal of pin right first metatarsal with stitch  X-rays indicate the osteotomy is healed well there is slight secondary healing but it is stable with prominent pin position proximal

## 2022-09-16 ENCOUNTER — Telehealth: Payer: Self-pay | Admitting: Urology

## 2022-09-16 NOTE — Telephone Encounter (Signed)
OFFICE DOS - 09/21/22  REMOVAL HARDWARE LEFT --- [id number]  [employer]  PER [employer]'S AUTOMATED SYSTEM FOR CPT CODE 51884 NO PRIOR AUTH IS REQUIRED.  REF # [id number]   RECEIVED A FAX FROM [employer] STATING THAT FOR CPT CODE 09323 NO PRIOR AUTH IS REQUIRED DUE TO PT HAS OTHER HEALTH INSURANCE THAT IS PRIMARY AND [employer] IS RESPONSIBLE FOR PAYMENT.

## 2022-09-21 ENCOUNTER — Encounter: Payer: Self-pay | Admitting: Podiatry

## 2022-09-21 ENCOUNTER — Ambulatory Visit (INDEPENDENT_AMBULATORY_CARE_PROVIDER_SITE_OTHER): Payer: 59 | Admitting: Podiatry

## 2022-09-21 DIAGNOSIS — Z472 Encounter for removal of internal fixation device: Secondary | ICD-10-CM

## 2022-09-21 NOTE — Progress Notes (Signed)
Subjective:   Patient ID: [employer], female   DOB: 42 y.o.   MRN: [id number]   HPI Patient presents stating she has an abnormal pin position of the right first metatarsal that becomes very painful and it swollen.  Presents for removal today   ROS      Objective:  Physical Exam  Neurovascular status intact with a prominent pin in the proximal portion incision site right painful when pressed     Assessment:  With no redness no drainage no proximal issues with prominent pin position first metatarsal     Plan:  H&P reviewed today I anesthetized with 120 mg Xylocaine Marcaine mixture patient was taken to the operating room sterile prep done and I then exsanguinated the foot and elevated the tourniquet to a 50 mmHg after sterile prep.  I then made an incision over the proximal portion first metatarsal approximate 2 cm and took it down to subcutaneous tissue I found there to be a small cyst which I excised and then I identified an abnormal pin position that had gotten loose and probably had trauma associated with it.  I went ahead and removed the pin in toto flushed copious amounts sterile Garamycin solution and sutured with 5-0 nylon.  Applied sterile dressing tourniquet released capillary fill noted to be immediate and patient left the OR in satisfactory condition

## 2022-09-22 ENCOUNTER — Ambulatory Visit (INDEPENDENT_AMBULATORY_CARE_PROVIDER_SITE_OTHER): Payer: 59 | Admitting: Gastroenterology

## 2022-09-22 ENCOUNTER — Encounter (INDEPENDENT_AMBULATORY_CARE_PROVIDER_SITE_OTHER): Payer: Self-pay | Admitting: Gastroenterology

## 2022-09-22 VITALS — Ht 66.0 in | Wt 205.0 lb

## 2022-09-22 DIAGNOSIS — K5909 Other constipation: Secondary | ICD-10-CM | POA: Diagnosis not present

## 2022-09-22 DIAGNOSIS — K219 Gastro-esophageal reflux disease without esophagitis: Secondary | ICD-10-CM
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
## 2022-09-22 NOTE — Patient Instructions (Addendum)
Stop symproic Start daily probiotic, aim for 2-3 strains of bacteria  Continue with trulance  daily Increase water intake, aim for atleast 64 oz per day Increase fruits, veggies and whole grains, kiwi and prunes are especially good for constipation Start benefiber 1T daily with a meal, you can increase to 2T daily after 1-2 weeks if needed  If constipation is not improved with the above changes, we can consider switching to something else other than trulance Continue your esomeprazole every other day for now  Follow up 6 months

## 2022-10-04 ENCOUNTER — Other Ambulatory Visit: Payer: Self-pay | Admitting: Allergy & Immunology

## 2022-10-04 ENCOUNTER — Other Ambulatory Visit: Payer: Self-pay | Admitting: Allergy

## 2022-10-05 ENCOUNTER — Encounter: Payer: 59 | Admitting: Podiatry

## 2022-10-07 ENCOUNTER — Encounter: Payer: Self-pay | Admitting: Podiatry

## 2022-10-07 ENCOUNTER — Ambulatory Visit (INDEPENDENT_AMBULATORY_CARE_PROVIDER_SITE_OTHER): Payer: 59

## 2022-10-07 ENCOUNTER — Ambulatory Visit (INDEPENDENT_AMBULATORY_CARE_PROVIDER_SITE_OTHER): Payer: 59 | Admitting: Podiatry

## 2022-10-07 VITALS — BP 146/91 | HR 90

## 2022-10-07 DIAGNOSIS — M21619 Bunion of unspecified foot: Secondary | ICD-10-CM

## 2022-10-07 DIAGNOSIS — M21611 Bunion of right foot: Secondary | ICD-10-CM | POA: Diagnosis not present

## 2022-10-09 NOTE — Progress Notes (Signed)
Subjective:   Patient ID: [employer], female   DOB: 42 y.o.   MRN: [id number]   HPI Patient presents stating overall very pleased feels like there is still a small knot   ROS      Objective:  Physical Exam  Neurovascular status intact with patient found to have well-healed surgical site right normal to have a small amount of swelling due to the surgery done 2 weeks ago with stitches intact     Assessment:  Doing well post removal of pin from the lef curr right forefoot     Plan:  H&P x-ray reviewed showing good alignment no signs of pathology bandage applied wear bandage for a few days no other treatment should be necessary but will recur if any issues with the surgery

## 2022-10-14 ENCOUNTER — Ambulatory Visit (INDEPENDENT_AMBULATORY_CARE_PROVIDER_SITE_OTHER): Payer: 59 | Admitting: *Deleted

## 2022-10-14 ENCOUNTER — Encounter: Payer: Self-pay | Admitting: Allergy

## 2022-10-14 DIAGNOSIS — J4550 Severe persistent asthma, uncomplicated: Secondary | ICD-10-CM | POA: Diagnosis not present

## 2022-10-24 ENCOUNTER — Ambulatory Visit (INDEPENDENT_AMBULATORY_CARE_PROVIDER_SITE_OTHER): Payer: 59 | Admitting: Family Medicine

## 2022-10-24 ENCOUNTER — Other Ambulatory Visit: Payer: Self-pay

## 2022-10-24 ENCOUNTER — Telehealth: Payer: Self-pay | Admitting: Allergy

## 2022-10-24 ENCOUNTER — Encounter: Payer: Self-pay | Admitting: Family Medicine

## 2022-10-24 VITALS — BP 110/82 | HR 76 | Temp 98.3°F | Wt 193.5 lb

## 2022-10-24 DIAGNOSIS — H1013 Acute atopic conjunctivitis, bilateral: Secondary | ICD-10-CM | POA: Diagnosis not present

## 2022-10-24 DIAGNOSIS — L282 Other prurigo: Secondary | ICD-10-CM | POA: Diagnosis not present

## 2022-10-24 DIAGNOSIS — J4550 Severe persistent asthma, uncomplicated: Secondary | ICD-10-CM

## 2022-10-24 DIAGNOSIS — K219 Gastro-esophageal reflux disease without esophagitis: Secondary | ICD-10-CM

## 2022-10-24 DIAGNOSIS — Z720 Tobacco use: Secondary | ICD-10-CM

## 2022-10-24 DIAGNOSIS — J302 Other seasonal allergic rhinitis: Secondary | ICD-10-CM

## 2022-10-24 DIAGNOSIS — J3089 Other allergic rhinitis: Secondary | ICD-10-CM

## 2022-10-24 DIAGNOSIS — T6391XD Toxic effect of contact with unspecified venomous animal, accidental (unintentional), subsequent encounter: Secondary | ICD-10-CM

## 2022-10-24 MED ORDER — FAMOTIDINE 20 MG PO TABS: 20.0000 mg | ORAL_TABLET | Freq: Two times a day (BID) | ORAL | 3 refills | Status: DC

## 2022-10-24 MED ORDER — CARBINOXAMINE MALEATE 4 MG PO TABS: ORAL_TABLET | ORAL | 0 refills | Status: DC

## 2022-10-24 MED ORDER — EPINEPHRINE 0.3 MG/0.3ML IJ SOAJ: 0.3000 mg | INTRAMUSCULAR | 2 refills | Status: AC | PRN

## 2022-10-24 MED ORDER — PREDNISONE 10 MG PO TABS: ORAL_TABLET | ORAL | 0 refills | Status: DC

## 2022-10-24 NOTE — Telephone Encounter (Signed)
Patient stating she having an allergy reaction not sure from what she thinks its dust at work asking if something can be called in for itching please advise

## 2022-10-24 NOTE — Progress Notes (Signed)
[address] [place] [address] Dept: [phone]  FOLLOW UP NOTE  Patient ID: Sharon Vazquez, female    DOB: 1981/02/10  Age: 42 y.o. MRN: [id number] Date of Office Visit: 10/24/2022  Assessment  Chief Complaint: ITCHING (ALL OVER HER BODY )  HPI Sharon Vazquez is a 42 year old female who presents to the clinic for an acute episode of urticaria.  She was last seen with clinic on 08/04/2022 by Dr. Delorse Lek for evaluation of asthma, allergic rhinitis, reflux, stinging insect allergy, and tobacco use.  At today's visit, she reports that she began to experience pruritus and hives that started about 3 hours ago.  She denies new foods, medications, personal care products, or insect stings.  She denies fever, sweats, chills, or sick contacts.  She reports that she has had pruritus and hives previously and that they usually occur about once a year.  She has taken 2 Benadryl tablets with mild relief of symptoms and reports that the symptoms are beginning to return at this time.  She denies concomitant cardiopulmonary or gastrointestinal symptoms with these hives.  The hives are occurring mainly on her forearms arms. She reports pruritus on her arms, scalp, and mid back.  Asthma is reported as well-controlled with no shortness of breath or wheeze with activity or rest.  She does report intermittent dry cough.  She continues montelukast 10 mg once a day, Symbicort 160-1 puff twice a day, and rarely uses albuterol for relief of symptoms.  She continues Fasenra injections once every 8 weeks with no large or local reactions.  She reports a significant decrease in her symptoms of asthma while continuing on Fasenra.  Allergic rhinitis is reported as moderately well-controlled with symptoms including clear rhinorrhea, nasal congestion, and sneezing.  She continues an antihistamine once a day as well as Ryaltris as needed.  She is not currently using saline nasal rinses.  She reports that she has tried  several antihistamines without relief of symptoms including Claritin, Allegra, and cetirizine.  Allergic conjunctivitis is reported as moderately well-controlled with occasional red and itchy eyes.  She continues to use refresh with relief of symptoms.  Reflux is reported as well-controlled with no symptoms including heartburn or vomiting.  She continues esomeprazole 40 mg every other day.  She reports that she did experience an incident with a stinging insect about 1 month ago after which she had shortness of breath for which she used an epinephrine autoinjector pen with immediate relief of symptoms.  She reports that she now has 1 epinephrine auto-injector pen.   She did receive venom immunotherapy in 2018 and, according to chart review, it was difficult for her to get to the clinic and she stopped receiving therapy after only a few injections.  She continues to smoke half a pack of cigarettes a day.  Her current medications are listed in the chart.   Drug Allergies:  Allergies  Allergen Reactions   Bee Venom Anaphylaxis   Dilaudid [Hydromorphone Hcl] Anaphylaxis and Hives    Has tolerated morphine since this reaction with Benadryl   Senna Anaphylaxis and Hives   Adhesive [Tape] Hives and Other (See Comments)    Pulls skin off (use paper tape)   Latex Hives    Physical Exam: BP 110/82   Pulse 76   Temp 98.3 F (36.8 C) (Temporal)   Wt 193 lb 8 oz (87.8 kg)   SpO2 99%   BMI 31.23 kg/m    Physical Exam Vitals reviewed.  Constitutional:  Appearance: Normal appearance.  HENT:     Head: Normocephalic and atraumatic.     Right Ear: Tympanic membrane normal.     Left Ear: Tympanic membrane normal.     Nose:     Comments: Bilateral nares edematous and pale with clear nasal drainage noted.  Pharynx normal.  Ears normal.  Eyes normal.    Mouth/Throat:     Pharynx: Oropharynx is clear.  Eyes:     Conjunctiva/sclera: Conjunctivae normal.  Cardiovascular:     Rate and  Rhythm: Normal rate and regular rhythm.     Heart sounds: Normal heart sounds. No murmur heard. Pulmonary:     Effort: Pulmonary effort is normal.     Breath sounds: Normal breath sounds.     Comments: Lungs clear to auscultation Musculoskeletal:        General: Normal range of motion.     Cervical back: Normal range of motion and neck supple.  Skin:    General: Skin is warm and dry.     Comments: Scattered raised, red, papular urticaria noted on bilateral arms.  No papular urticaria noted on her back.  Neurological:     Mental Status: She is alert and oriented to person, place, and time.  Psychiatric:        Mood and Affect: Mood normal.        Behavior: Behavior normal.        Thought Content: Thought content normal.        Judgment: Judgment normal.    Assessment and Plan: 1. Papular urticaria   2. Severe persistent asthma without complication   3. Seasonal and perennial allergic rhinitis   4. Allergic conjunctivitis of both eyes   5. Gastroesophageal reflux disease, unspecified whether esophagitis present   6. Toxic effect of venom, accidental or unintentional, subsequent encounter   7. Tobacco use     Meds ordered this encounter  Medications   famotidine (PEPCID) 20 MG tablet    Sig: Take 1 tablet (20 mg total) by mouth 2 (two) times daily.    Dispense:  60 tablet    Refill:  3   Carbinoxamine Maleate 4 MG TABS    Sig: Take 2 tablets twice a day for hives or itch    Dispense:  28 tablet    Refill:  0   predniSONE (DELTASONE) 10 MG tablet    Sig: Take 2 tablets once a day for 3 days, then stop    Dispense:  6 tablet    Refill:  0   EPINEPHrine 0.3 mg/0.3 mL IJ SOAJ injection    Sig: Inject 0.3 mg into the muscle as needed for anaphylaxis.    Dispense:  1 each    Refill:  2    Please dispense Mylan or Teva brand generic only.    Patient Instructions  Hives  Begin carbinoxamine 4 mg tablets. Take 2 tablets twice a day to control hives  Begin famotidine 20 mg  twice a day to control hives asthma Begin prednisone 10 mg twice a day for the next 3 days and then stop  Asthma Continue Symbicort 160- 1 puff twice a day with spacer to help prevent cough and wheeze.   Continue montelukast 10 mg once a day to prevent cough and wheeze.  Continue albuterol 2 puffs every 4 hours as needed for cough or wheeze OR Instead use albuterol 0.083% solution via nebulizer one unit vial every 4 hours as needed for cough or wheeze Continue receiving Harrington Challenger  injections once every 8 weeks and have access to an epinephrine injector set.    Allergic rhinitis Continue allergen avoidance measures directed toward pollen, mold, dust mite, pets, and cockroach as listed below Begin carbinoxamine 2 tablets twice a day as needed for nasal symptoms Continue montelukast 10 mg once a day to control allergy symptoms Continue Ryaltris 2 sprays each nostril 1-2 times a day for runny or stuffy nose Consider saline nasal rinses as needed for nasal symptoms. Use this before any medicated nasal sprays for best result Consider updating your allergy skin testing.  Remember to stop antihistamines for 3 days before your skin testing appointment.  Allergic conjunctivitis Continue refresh eyedrops as recommended by your eye doctor  Reflux Continue dietary and lifestyle modifications as listed below Continue esomeprazole 40 mg once every other day as previously prescribed by your gastroenterologist  Stinging insect allergy Do your best to avoid stinging insects.  In case of an allergic reaction, take Benadryl 50 mg every 4 hours, and if life-threatening symptoms occur, inject with EpiPen 0.3 mg. Consider restarting venom immunotherapy  Tobacco use Talk with your PCP smoking cessation You have stopped before and can stop again!  Follow up in 2 months or sooner if needed.  Return in about 2 months (around 12/24/2022), or if symptoms worsen or fail to improve.    Thank you for the  opportunity to care for this patient.  Please do not hesitate to contact me with questions.  Thermon Leyland, FNP [facility]

## 2022-10-24 NOTE — Patient Instructions (Addendum)
Hives  Begin carbinoxamine 4 mg tablets. Take 2 tablets twice a day to control hives  Begin famotidine 20 mg twice a day to control hives asthma Begin prednisone 10 mg twice a day for the next 3 days and then stop  Asthma Continue Symbicort 160- 1 puff twice a day with spacer to help prevent cough and wheeze.   Continue montelukast 10 mg once a day to prevent cough and wheeze.  Continue albuterol 2 puffs every 4 hours as needed for cough or wheeze OR Instead use albuterol 0.083% solution via nebulizer one unit vial every 4 hours as needed for cough or wheeze Continue receiving Fasenra injections once every 8 weeks and have access to an epinephrine injector set.    Allergic rhinitis Continue allergen avoidance measures directed toward pollen, mold, dust mite, pets, and cockroach as listed below Begin carbinoxamine 2 tablets twice a day as needed for nasal symptoms Continue montelukast 10 mg once a day to control allergy symptoms Continue Ryaltris 2 sprays each nostril 1-2 times a day for runny or stuffy nose Consider saline nasal rinses as needed for nasal symptoms. Use this before any medicated nasal sprays for best result Consider updating your allergy skin testing.  Remember to stop antihistamines for 3 days before your skin testing appointment.  Allergic conjunctivitis Continue refresh eyedrops as recommended by your eye doctor  Reflux Continue dietary and lifestyle modifications as listed below Continue esomeprazole 40 mg once every other day as previously prescribed by your gastroenterologist  Stinging insect allergy Do your best to avoid stinging insects.  In case of an allergic reaction, take Benadryl 50 mg every 4 hours, and if life-threatening symptoms occur, inject with EpiPen 0.3 mg. Consider restarting venom immunotherapy  Tobacco use Talk with your PCP smoking cessation You have stopped before and can stop again!  Follow up in 2 months or sooner if needed.

## 2022-10-24 NOTE — Telephone Encounter (Signed)
Called patient - DOB verfied - stated itching/whelping today all over now due to dust at/from her job.  Schedule appointment: today, Monday @ 1:30 pm w/Anne for further evaluation and treatment.  Patient verbalized understanding, no questions.  Forwarding message to [employer] as update.

## 2022-10-24 NOTE — Telephone Encounter (Signed)
Patient has been scheduled to see Thermon Leyland regarding this flare up.

## 2022-11-22 ENCOUNTER — Other Ambulatory Visit: Payer: Self-pay | Admitting: Allergy & Immunology

## 2022-12-12 ENCOUNTER — Ambulatory Visit (INDEPENDENT_AMBULATORY_CARE_PROVIDER_SITE_OTHER): Payer: 59

## 2022-12-12 ENCOUNTER — Encounter: Payer: Self-pay | Admitting: Family Medicine

## 2022-12-12 ENCOUNTER — Telehealth: Payer: Self-pay

## 2022-12-12 DIAGNOSIS — J4550 Severe persistent asthma, uncomplicated: Secondary | ICD-10-CM | POA: Diagnosis not present

## 2022-12-12 NOTE — Telephone Encounter (Signed)
Patient came in for her Harrington Challenger injection today and we did not have any in office I used a sample since she was an 8:30am patient and she took off work for her injection. Please advise on what she needs to do for her next injection.

## 2023-01-13 ENCOUNTER — Other Ambulatory Visit: Payer: Self-pay | Admitting: Orthopaedic Surgery

## 2023-01-13 DIAGNOSIS — M25551 Pain in right hip: Secondary | ICD-10-CM

## 2023-01-21 ENCOUNTER — Other Ambulatory Visit (INDEPENDENT_AMBULATORY_CARE_PROVIDER_SITE_OTHER): Payer: Self-pay | Admitting: Gastroenterology

## 2023-02-07 ENCOUNTER — Other Ambulatory Visit (INDEPENDENT_AMBULATORY_CARE_PROVIDER_SITE_OTHER): Payer: Self-pay | Admitting: Gastroenterology

## 2023-02-07 ENCOUNTER — Ambulatory Visit: Payer: 59

## 2023-02-13 ENCOUNTER — Other Ambulatory Visit (INDEPENDENT_AMBULATORY_CARE_PROVIDER_SITE_OTHER): Payer: Self-pay | Admitting: Gastroenterology

## 2023-02-15 ENCOUNTER — Encounter: Payer: Self-pay | Admitting: Orthopaedic Surgery

## 2023-02-20 ENCOUNTER — Ambulatory Visit
Admission: RE | Admit: 2023-02-20 | Discharge: 2023-02-20 | Disposition: A | Discharge status: Home / Self Care | Payer: 59 | Source: Ambulatory Visit | Attending: Orthopaedic Surgery | Admitting: Orthopaedic Surgery

## 2023-02-20 DIAGNOSIS — M25551 Pain in right hip: Secondary | ICD-10-CM

## 2023-02-20 MED ORDER — IOPAMIDOL (ISOVUE-M 200) INJECTION 41%
10.0000 mL | Freq: Once | INTRAMUSCULAR | Status: AC
  Administered 2023-02-20: 10 mL via INTRA_ARTICULAR

## 2023-03-27 ENCOUNTER — Ambulatory Visit (INDEPENDENT_AMBULATORY_CARE_PROVIDER_SITE_OTHER): Payer: 59 | Admitting: Gastroenterology

## 2023-03-30 ENCOUNTER — Encounter (INDEPENDENT_AMBULATORY_CARE_PROVIDER_SITE_OTHER): Payer: Self-pay | Admitting: Gastroenterology

## 2023-03-30 ENCOUNTER — Ambulatory Visit (INDEPENDENT_AMBULATORY_CARE_PROVIDER_SITE_OTHER): Payer: 59 | Admitting: Gastroenterology

## 2023-03-30 VITALS — BP 132/84 | HR 76 | Temp 97.5°F | Ht 66.0 in | Wt 196.4 lb

## 2023-03-30 DIAGNOSIS — K5903 Drug induced constipation: Secondary | ICD-10-CM

## 2023-03-30 DIAGNOSIS — K219 Gastro-esophageal reflux disease without esophagitis: Secondary | ICD-10-CM | POA: Diagnosis not present

## 2023-03-30 DIAGNOSIS — T402X5A Adverse effect of other opioids, initial encounter: Secondary | ICD-10-CM | POA: Diagnosis not present

## 2023-03-30 DIAGNOSIS — K581 Irritable bowel syndrome with constipation: Secondary | ICD-10-CM | POA: Insufficient documentation

## 2023-03-30 DIAGNOSIS — K589 Irritable bowel syndrome without diarrhea: Secondary | ICD-10-CM | POA: Insufficient documentation

## 2023-03-30 DIAGNOSIS — Z79891 Long term (current) use of opiate analgesic: Secondary | ICD-10-CM | POA: Diagnosis not present

## 2023-03-30 MED ORDER — ESOMEPRAZOLE MAGNESIUM 40 MG PO CPDR
40.0000 mg | DELAYED_RELEASE_CAPSULE | Freq: Every day | ORAL | 3 refills | Status: DC
Start: 2023-03-30 — End: 2024-05-01

## 2023-03-30 MED ORDER — TRULANCE 3 MG PO TABS
3.0000 mg | ORAL_TABLET | Freq: Every day | ORAL | 3 refills | Status: DC
Start: 2023-03-30 — End: 2024-04-15

## 2023-03-30 NOTE — Patient Instructions (Signed)
Continue Trulance 3 mg qday Continue pantoprazole 40 mg qday
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
## 2023-04-17 NOTE — Telephone Encounter (Signed)
No other notes needed

## 2023-04-24 ENCOUNTER — Ambulatory Visit
Admission: EM | Admit: 2023-04-24 | Discharge: 2023-04-24 | Disposition: A | Discharge status: Home / Self Care | Payer: 59 | Attending: Nurse Practitioner | Admitting: Nurse Practitioner

## 2023-04-24 ENCOUNTER — Other Ambulatory Visit: Payer: Self-pay

## 2023-04-24 DIAGNOSIS — F439 Reaction to severe stress, unspecified: Secondary | ICD-10-CM

## 2023-04-24 DIAGNOSIS — I10 Essential (primary) hypertension: Secondary | ICD-10-CM
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
## 2023-04-24 NOTE — Discharge Instructions (Addendum)
Your blood pressure and EKG are normal today.  Continue to monitor your blood pressure readings at home.  If your blood pressure continues to remain elevated, please follow-up with your primary care physician to discuss. Continue your current medications.  If your blood pressure readings remain elevated, recommend taking metoprolol 100 mg as needed.  Which her blood pressures are within normal range, you can return to your regular dose of 50 mg daily. Try to find ways to help alleviate your stress.  This appears to possibly be impacting your blood pressure. Make sure you are eating a diet that is low in sodium.  Also consider regular exercise such as walking to help decrease your stress and blood pressure. Go to the emergency department immediately if you experience chest pain, shortness of breath, difficulty breathing, with continued high blood pressures. Follow-up as needed.

## 2023-04-24 NOTE — ED Triage Notes (Signed)
Pt c/o elevated Blood pressure x3 days, blood pressure has been running between 142-187 systolic  and 80-117 diastolic. Pt states she is having to deal with some stress at home. Was see at primary care recently and was told to cut her BP medications in half sue to good readings. Pt reports a headache in the center of forehead since 6:30 this morning

## 2023-05-01 ENCOUNTER — Other Ambulatory Visit: Payer: Self-pay | Admitting: Family Medicine

## 2023-07-12 ENCOUNTER — Other Ambulatory Visit: Payer: Self-pay | Admitting: Allergy & Immunology

## 2023-07-21 ENCOUNTER — Telehealth: Payer: Self-pay | Admitting: Allergy & Immunology

## 2023-07-21 NOTE — Telephone Encounter (Signed)
Called patient to schedule Sharon Vazquez. She states she doesn't need this biologic in the Winter. I informed her that her authorization is about to expire and she states she will give [place] a call back to schedule when she's ready.

## 2023-07-25 ENCOUNTER — Encounter: Payer: Self-pay | Admitting: Emergency Medicine

## 2023-07-25 ENCOUNTER — Other Ambulatory Visit: Payer: Self-pay

## 2023-07-25 ENCOUNTER — Ambulatory Visit
Admission: EM | Admit: 2023-07-25 | Discharge: 2023-07-25 | Disposition: A | Discharge status: Home / Self Care | Payer: 59

## 2023-07-25 DIAGNOSIS — M25511 Pain in right shoulder: Secondary | ICD-10-CM
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
## 2023-07-25 NOTE — ED Triage Notes (Addendum)
Pt reports continued left shoulder pain since workman's comp claim in December. Pt reports has workman's comp doctor, supposed to follow-up with ortho next week and reports "I am in between doctor's and need note for work until I can see ortho." Pt reports is in pain management for back pain as well.

## 2023-07-28 NOTE — Telephone Encounter (Signed)
I will mover her to inactive at this time. If she decides to restart Harrington Challenger she will have go through all the steps again 2 steroids bursts for exacerbations, daily controller and lab test for eos

## 2023-08-09 ENCOUNTER — Other Ambulatory Visit: Payer: Self-pay | Admitting: Allergy & Immunology

## 2023-09-05 ENCOUNTER — Encounter (INDEPENDENT_AMBULATORY_CARE_PROVIDER_SITE_OTHER): Payer: Self-pay

## 2023-09-05 ENCOUNTER — Encounter (INDEPENDENT_AMBULATORY_CARE_PROVIDER_SITE_OTHER): Payer: Self-pay | Admitting: Gastroenterology

## 2023-09-05 ENCOUNTER — Ambulatory Visit (INDEPENDENT_AMBULATORY_CARE_PROVIDER_SITE_OTHER): Admitting: Gastroenterology

## 2023-09-05 ENCOUNTER — Other Ambulatory Visit (INDEPENDENT_AMBULATORY_CARE_PROVIDER_SITE_OTHER): Payer: Self-pay | Admitting: Gastroenterology

## 2023-09-05 ENCOUNTER — Telehealth (INDEPENDENT_AMBULATORY_CARE_PROVIDER_SITE_OTHER): Payer: Self-pay | Admitting: Gastroenterology

## 2023-09-05 VITALS — BP 126/87 | HR 80 | Temp 97.7°F | Ht 66.0 in | Wt 190.6 lb

## 2023-09-05 DIAGNOSIS — T402X5A Adverse effect of other opioids, initial encounter: Secondary | ICD-10-CM | POA: Diagnosis not present

## 2023-09-05 DIAGNOSIS — K581 Irritable bowel syndrome with constipation: Secondary | ICD-10-CM

## 2023-09-05 DIAGNOSIS — K5903 Drug induced constipation: Secondary | ICD-10-CM

## 2023-09-05 MED ORDER — NALOXEGOL OXALATE 25 MG PO TABS: 25.0000 mg | ORAL_TABLET | Freq: Every day | ORAL | 1 refills | Status: DC

## 2023-09-05 NOTE — Patient Instructions (Addendum)
-  Please continue trulance 3mg  daily -I have sent movantik 25mg  to be taken with the trulance, if insurance does not cover this, we will send a different medication to take with trulance, until you are able to get this medication, please take miralax 1 capful 3 times per day in addition to your trulance -Increase water intake, aim for atleast 64 oz per day -Increase fruits, veggies and whole grains, kiwi and prunes are especially good for constipation  Follow up 6-8 weeks  It was a pleasure to see you today. I want to create trusting relationships with patients and provide genuine, compassionate, and quality care. I truly value your feedback! please be on the lookout for a survey regarding your visit with me today. I appreciate your input about our visit and your time in completing this!    Mekaela Azizi L. Jeanmarie Hubert, MSN, APRN, AGNP-C Adult-Gerontology Nurse Practitioner [facility]

## 2023-09-05 NOTE — Telephone Encounter (Signed)
 Pt needing refills on Trulance also. Pt states she is unsure how many refills she has. [facility]. Please advise. Thank you

## 2023-09-05 NOTE — Telephone Encounter (Signed)
 Mychart message sent to patient.
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
## 2023-09-06 ENCOUNTER — Encounter (INDEPENDENT_AMBULATORY_CARE_PROVIDER_SITE_OTHER): Payer: Self-pay

## 2023-09-11 ENCOUNTER — Telehealth (INDEPENDENT_AMBULATORY_CARE_PROVIDER_SITE_OTHER): Payer: Self-pay

## 2023-09-11 NOTE — Telephone Encounter (Signed)
 09/06/2023: Tried to do pa on patient Movantik per Cover My Meds the patient has an alternative insurance. I called the patient and made her aware the pharmacy did not have her correct insurance and she would need to

## 2023-09-11 NOTE — Telephone Encounter (Signed)
 Tried to do pa on patient Movantik per Cover My Meds, the patient has an alternative insurance. I called the patient and made her aware the pharmacy did not have her correct insurance and she would need to reach out to the pharmacy and give the correct insurance. I spoke with the patient she says she only has this one insurance, I advised that the pharmacy plan is still recognizing she has other coverage and she will need to call and get this straight. Patient states understanding.

## 2023-09-12 NOTE — Telephone Encounter (Signed)
 Patient called the office back to check on the status of her script for Movantik saying she called the insurance and told them she has no other coverage other that the [employer] [place] Medicaid. She asked that I re run the PA through to see if this made a difference. The same message came through that the pharmacy needs to submit to other processor or primary payer. I called [place] pharmacy and spoke with French Ana there she says they are showing the patient has two insurances one called [employer] which on its on the patient co pay is still $400.00, they are saying the [place] medicare should help with the cost of this. She says there was nothing she could do, but she had an Herbalist that works for their company named Winter she will reach out to her to see if she has any recommendations on how to proceed from here.

## 2023-09-13 NOTE — Telephone Encounter (Signed)
 I called today to check the status on whether Winter with [facility] was able to figure anything out in regards to the patient insurance. Per Trip, Winter is aware of the situation and they are working on trying to get Medicaid to pay for what is left after [employer] pays the majority. He says [employer] is paying the majority of the claim and that leaves the patient with a $ 400. 00 co pay that they are trying to get [place] Medicaid to pay the rest. He says they will let the patient and myself know when we have an update. I called the patient back and let her know all of this. Patient is still adamant that she does not have but one insurance which is [employer]. I advised she needed to call Robbie Lis or her insurance and get this straighten out as the Medicaid is still giving [place] a denial as it is showing she has two insurances and we can no move forward until the [employer] is removed. Patient states understanding and will reach out to the pharmacy [facility] for further instructions.

## 2023-09-14 ENCOUNTER — Encounter (INDEPENDENT_AMBULATORY_CARE_PROVIDER_SITE_OTHER): Payer: Self-pay

## 2023-09-14 NOTE — Telephone Encounter (Signed)
 Patient called this morning reporting she does not have [employer] coverage, and she called [employer] yesterday and they have sent her the letter stating when her coverage with them ended. I advised the patient to go by the pharmacy and show them her notice of cancellation with [employer],so they can remove that insurance from her profile, that way they can send me the correct Key for Cover My Meds and hopefully [place] Medicaid will cover her medication. Patient says she will do this.

## 2023-09-14 NOTE — Telephone Encounter (Signed)
 Date: 09/14/2023 To: Dr. Doylene Bode Fax number: [phone] Subject: Pharmacy prior authorization request number [id number] Regarding member: [id number]; Verginia Mangus; 04-25-1981 Dear Dr. Doylene Bode: [facility] reviewed your request for MOVANTIK 25 MG TABLET for the above-identified member, and it is approved as follows: MOVANTIK 25 MG TABLET: quantity/billable units of 30 approved for 09/14/2023-09/13/2024. Approved J code (if applicable): Thank you, [facility]

## 2023-09-15 NOTE — Telephone Encounter (Signed)
 I spoke with the patient yesterday afternoon and made her aware the medication has now been approved and she may pick up from the pharmacy.

## 2023-09-20 ENCOUNTER — Telehealth (INDEPENDENT_AMBULATORY_CARE_PROVIDER_SITE_OTHER): Payer: Self-pay | Admitting: *Deleted

## 2023-09-20 NOTE — Telephone Encounter (Signed)
 Seen in office on 09/05/23 for constipation. She is taking trulance every day and just started adding on movantik either last Saturday or Sunday she is not sure. She said Monday she did not eat all day til that night and Tuesday she started having nausea, sweating, bloating, she had ner neighbor come over and give her an enema and then she started having diarrhea. She vomited 2 -3 times last night. This morning she feels better. Still having some liquid stools but not as much as after the enema last night. No more vomiting today. She ate two pieces of pizza last night and nothing since. She is drinking plenty of water she said. No fever. No sweating,  nausea, or vomiting today. She said her abdomen hurt in low mid area. It was like a dull pain off and on. She feels weak today but states she feels better than yesterday. She has appt on 5/15 for a follow up.   [phone]

## 2023-09-20 NOTE — Telephone Encounter (Signed)
 Patient states since starting movantik she has a Bm every morning. Has been on movantik for 5 days. Before then her las BM was April 4th or 5th

## 2023-09-20 NOTE — Telephone Encounter (Signed)
 Discussed with patient and she wanted to know if she was going to continue feeling weak. Also she was concerned that she has not been eating. Advised her to go get evaluated with pcp or urgent care today. She verbalized understanding.

## 2023-10-19 ENCOUNTER — Encounter (INDEPENDENT_AMBULATORY_CARE_PROVIDER_SITE_OTHER): Payer: Self-pay | Admitting: Gastroenterology

## 2023-10-19 ENCOUNTER — Ambulatory Visit (INDEPENDENT_AMBULATORY_CARE_PROVIDER_SITE_OTHER): Admitting: Gastroenterology

## 2023-10-19 VITALS — BP 142/100 | HR 76 | Temp 97.1°F | Ht 66.0 in | Wt 195.9 lb

## 2023-10-19 DIAGNOSIS — K5900 Constipation, unspecified: Secondary | ICD-10-CM

## 2023-10-19 DIAGNOSIS — K5903 Drug induced constipation: Secondary | ICD-10-CM
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
## 2023-10-19 NOTE — Patient Instructions (Addendum)
-  continue trulance  3mg  daily -continue movantik  25mg  daily -Miralax  1 capful TID  -referral for anorectal manometry to test the muscles of the rectal area  -Increase water  intake, aim for atleast 64 oz per day -Increase fruits, veggies and whole grains, kiwi and prunes are especially good for constipation  All questions were answered, patient verbalized understanding and is in agreement with plan as outlined above.   Follow up 3 month

## 2023-11-03 ENCOUNTER — Other Ambulatory Visit (INDEPENDENT_AMBULATORY_CARE_PROVIDER_SITE_OTHER): Payer: Self-pay | Admitting: Gastroenterology

## 2023-12-21 ENCOUNTER — Telehealth (INDEPENDENT_AMBULATORY_CARE_PROVIDER_SITE_OTHER): Payer: Self-pay

## 2023-12-21 NOTE — Telephone Encounter (Signed)
 Patient here today with a family member last seen 10/19/2023 by Mitzie due to drug induced constipation. Patient says she cut her rectum trying to shave and it is very painful would like something called in for this. She is using neosporin and corn starch and this has not been helpful. Patient uses [facility]. Please advise. Thanks,

## 2023-12-22 ENCOUNTER — Other Ambulatory Visit (INDEPENDENT_AMBULATORY_CARE_PROVIDER_SITE_OTHER): Payer: Self-pay | Admitting: Gastroenterology

## 2023-12-22 MED ORDER — LIDOCAINE 0.5 % EX GEL: 1.0000 | Freq: Every day | CUTANEOUS | 0 refills | Status: DC | PRN

## 2023-12-29 ENCOUNTER — Other Ambulatory Visit: Payer: Self-pay | Admitting: Allergy

## 2024-01-12 ENCOUNTER — Other Ambulatory Visit: Payer: Self-pay | Admitting: Allergy

## 2024-01-22 ENCOUNTER — Ambulatory Visit (INDEPENDENT_AMBULATORY_CARE_PROVIDER_SITE_OTHER): Admitting: Gastroenterology

## 2024-01-23 ENCOUNTER — Ambulatory Visit (INDEPENDENT_AMBULATORY_CARE_PROVIDER_SITE_OTHER): Admitting: Gastroenterology

## 2024-02-20 ENCOUNTER — Encounter (INDEPENDENT_AMBULATORY_CARE_PROVIDER_SITE_OTHER): Payer: Self-pay | Admitting: Gastroenterology

## 2024-02-20 ENCOUNTER — Ambulatory Visit (INDEPENDENT_AMBULATORY_CARE_PROVIDER_SITE_OTHER): Admitting: Gastroenterology

## 2024-02-20 VITALS — BP 143/98 | HR 80 | Temp 97.3°F | Ht 66.0 in | Wt 206.4 lb

## 2024-02-20 DIAGNOSIS — K5902 Outlet dysfunction constipation: Secondary | ICD-10-CM | POA: Insufficient documentation

## 2024-02-20 DIAGNOSIS — K219 Gastro-esophageal reflux disease without esophagitis: Secondary | ICD-10-CM | POA: Diagnosis not present

## 2024-02-20 DIAGNOSIS — K5909 Other constipation: Secondary | ICD-10-CM

## 2024-02-20 NOTE — Patient Instructions (Signed)
 Please continue movantik  and trulance  daily Continue miralax  3 capfuls per day Let me know if you do not have a BM by tomorrow We will refer you to pelvic floor physical therapy  Follow up 4 months  It was a pleasure to see you today. I want to create trusting relationships with patients and provide genuine, compassionate, and quality care. I truly value your feedback! please be on the lookout for a survey regarding your visit with me today. I appreciate your input about our visit and your time in completing this!    Tannor Pyon L. Thornton Dohrmann, MSN, APRN, AGNP-C Adult-Gerontology Nurse Practitioner [facility] Gastroenterology at [facility]
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
## 2024-02-28 ENCOUNTER — Ambulatory Visit
Admission: EM | Admit: 2024-02-28 | Discharge: 2024-02-28 | Disposition: A | Discharge status: Home / Self Care | Attending: Family Medicine | Admitting: Family Medicine

## 2024-02-28 ENCOUNTER — Encounter: Payer: Self-pay | Admitting: Emergency Medicine

## 2024-02-28 ENCOUNTER — Ambulatory Visit (INDEPENDENT_AMBULATORY_CARE_PROVIDER_SITE_OTHER)

## 2024-02-28 DIAGNOSIS — M25572 Pain in left ankle and joints of left foot: Secondary | ICD-10-CM
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
## 2024-02-28 NOTE — ED Triage Notes (Signed)
 Rolled left ankle yesterday.  Pain and swelling to left ankle

## 2024-02-28 NOTE — Discharge Instructions (Signed)
 Your x-ray did not show any fractures which is great news.  We have applied an Ace wrap today for compression and you may use this daily as needed while you are up and moving, may take off at rest.  Elevate the leg at rest, ice off-and-on, ibuprofen  and Tylenol  as needed.  Return for worsening symptoms.

## 2024-03-20 ENCOUNTER — Encounter (INDEPENDENT_AMBULATORY_CARE_PROVIDER_SITE_OTHER): Payer: Self-pay | Admitting: Gastroenterology

## 2024-04-01 ENCOUNTER — Ambulatory Visit (INDEPENDENT_AMBULATORY_CARE_PROVIDER_SITE_OTHER): Payer: 59 | Admitting: Gastroenterology
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
## 2024-04-11 ENCOUNTER — Ambulatory Visit: Attending: Gastroenterology | Admitting: Physical Therapy

## 2024-04-11 ENCOUNTER — Encounter: Payer: Self-pay | Admitting: Physical Therapy

## 2024-04-11 ENCOUNTER — Other Ambulatory Visit: Payer: Self-pay

## 2024-04-11 DIAGNOSIS — M6281 Muscle weakness (generalized): Secondary | ICD-10-CM | POA: Insufficient documentation

## 2024-04-11 DIAGNOSIS — R293 Abnormal posture: Secondary | ICD-10-CM | POA: Diagnosis present

## 2024-04-15 ENCOUNTER — Ambulatory Visit (INDEPENDENT_AMBULATORY_CARE_PROVIDER_SITE_OTHER): Payer: Self-pay | Admitting: Physical Therapy

## 2024-04-15 ENCOUNTER — Other Ambulatory Visit (INDEPENDENT_AMBULATORY_CARE_PROVIDER_SITE_OTHER): Payer: Self-pay

## 2024-04-15 DIAGNOSIS — R293 Abnormal posture: Secondary | ICD-10-CM

## 2024-04-15 DIAGNOSIS — M6281 Muscle weakness (generalized): Secondary | ICD-10-CM | POA: Diagnosis not present

## 2024-04-15 DIAGNOSIS — K581 Irritable bowel syndrome with constipation: Secondary | ICD-10-CM

## 2024-04-15 MED ORDER — TRULANCE 3 MG PO TABS: 3.0000 mg | ORAL_TABLET | Freq: Every day | ORAL | 3 refills | Status: AC

## 2024-04-15 NOTE — Patient Instructions (Signed)
 Types of Fiber  There are two main types of fiber:  insoluble and soluble.  Both of these types can prevent and relieve constipation and diarrhea, although some people find one or the other to be more easily digested.  This handout details information about both types of fiber. recommended 25-35 grams of fiber per day,  average 9-12 grams per meal   key is a balance between soluble and insoluble  Insoluble Fiber        Functions of Insoluble Fiber moves bulk through the intestines  controls and balances the pH (acidity) in the intestines   This type of fiber should be avoided or reduced if you have soft, frequent bowel movements or leakage      Benefits of Insoluble Fiber promotes regular bowel movement and prevents constipation  removes fecal waste through colon in less time  keeps an optimal pH in intestines to prevent microbes from producing cancer substances, therefore preventing colon cancer        Food Sources of Insoluble Fiber whole-wheat products  wheat bran "miller's bran" corn bran  flax seed or other seeds vegetables such as green beans, broccoli, cauliflower and potato skins  fruit skins and root vegetable skins  popcorn brown rice Kiwis   Soluble Fiber( Types 5,6,7 more loose stools)       Functions of Soluble Fiber  holds water  in the colon to bulk and soften the stool prolongs stomach emptying time so that sugar is released and absorbed more slowly  prevent leakage associated with soft, frequent bowel movements.        Benefits of Soluble Fiber lowers total cholesterol and LDL cholesterol (the bad cholesterol) therefore reducing the risk of heart disease  regulates blood sugar for people with diabetes       Food Sources of Soluble Fiber oat/oat bran dried beans and peas  nuts  barley  flax seed or other seeds fruits such as oranges, pears, peaches, and apples  vegetables such as carrots  psyllium husk  prunes  Kiwis
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
## 2024-04-16 ENCOUNTER — Encounter (INDEPENDENT_AMBULATORY_CARE_PROVIDER_SITE_OTHER): Payer: Self-pay | Admitting: Gastroenterology

## 2024-04-25 ENCOUNTER — Ambulatory Visit (INDEPENDENT_AMBULATORY_CARE_PROVIDER_SITE_OTHER): Payer: Self-pay

## 2024-04-25 DIAGNOSIS — R293 Abnormal posture: Secondary | ICD-10-CM

## 2024-04-25 DIAGNOSIS — M6281 Muscle weakness (generalized): Secondary | ICD-10-CM | POA: Diagnosis not present
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
## 2024-04-25 NOTE — Patient Instructions (Signed)
 Exhale with pushing/trying to have a bowel movement - pretend you're blowing out through a really tight straw or blowing up a balloon Make sure feet are up and you're relaxed when trying to have a bowel movement Try splinting - pressing wad of toilet paper between vagina and anus and gently press up and back when you are trying to have a bowel movement   [facility] [address] [place], [place] [address] Phone # [phone] Fax [phone]

## 2024-05-01 ENCOUNTER — Other Ambulatory Visit (INDEPENDENT_AMBULATORY_CARE_PROVIDER_SITE_OTHER): Payer: Self-pay

## 2024-05-01 DIAGNOSIS — K219 Gastro-esophageal reflux disease without esophagitis: Secondary | ICD-10-CM

## 2024-05-01 MED ORDER — ESOMEPRAZOLE MAGNESIUM 40 MG PO CPDR: 40.0000 mg | DELAYED_RELEASE_CAPSULE | Freq: Every day | ORAL | 0 refills | Status: AC

## 2024-05-23 ENCOUNTER — Telehealth: Payer: Self-pay | Admitting: Physical Therapy

## 2024-05-23 ENCOUNTER — Ambulatory Visit: Attending: Gastroenterology | Admitting: Physical Therapy

## 2024-05-23 DIAGNOSIS — R293 Abnormal posture: Secondary | ICD-10-CM | POA: Insufficient documentation

## 2024-05-23 DIAGNOSIS — M6281 Muscle weakness (generalized): Secondary | ICD-10-CM | POA: Insufficient documentation

## 2024-05-23 NOTE — Telephone Encounter (Signed)
 PT called pt about this morning's appointment at 1100. Pt had responded no on reminder text and thought this cancelled appointment. But reports she will call back to reschedule.  Darryle Navy, PT, DPT 12/18/202511:44 AM  [facility] [address], [address] [place], [place] [address] Phone # [phone] Fax [phone]

## 2024-05-28 ENCOUNTER — Ambulatory Visit

## 2024-06-24 ENCOUNTER — Ambulatory Visit (INDEPENDENT_AMBULATORY_CARE_PROVIDER_SITE_OTHER): Admitting: Gastroenterology

## 2024-06-27 ENCOUNTER — Telehealth (INDEPENDENT_AMBULATORY_CARE_PROVIDER_SITE_OTHER): Payer: Self-pay

## 2024-06-27 ENCOUNTER — Ambulatory Visit (INDEPENDENT_AMBULATORY_CARE_PROVIDER_SITE_OTHER): Admitting: Gastroenterology

## 2024-06-27 ENCOUNTER — Encounter (INDEPENDENT_AMBULATORY_CARE_PROVIDER_SITE_OTHER): Payer: Self-pay | Admitting: Gastroenterology

## 2024-06-27 VITALS — BP 126/81 | HR 90 | Temp 97.6°F | Ht 66.0 in | Wt 192.3 lb

## 2024-06-27 DIAGNOSIS — R6881 Early satiety: Secondary | ICD-10-CM

## 2024-06-27 DIAGNOSIS — R634 Abnormal weight loss: Secondary | ICD-10-CM | POA: Diagnosis not present

## 2024-06-27 DIAGNOSIS — K5900 Constipation, unspecified: Secondary | ICD-10-CM | POA: Diagnosis not present

## 2024-06-27 DIAGNOSIS — K5909 Other constipation: Secondary | ICD-10-CM

## 2024-06-27 NOTE — Telephone Encounter (Signed)
 Spoke with patient, scheduled EGD for 07/04/2024 at 8:15am. Instructions given to patient.
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
## 2024-06-27 NOTE — Patient Instructions (Signed)
-  continue nexium  40mg  daily -continue movantik  25mg  daily -continue trulance  3mg  daily -continue miralax  as needed - we will schedule EGD for further evaluation  Follow up 3 months  It was a pleasure to see you today. I want to create trusting relationships with patients and provide genuine, compassionate, and quality care. I truly value your feedback! please be on the lookout for a survey regarding your visit with me today. I appreciate your input about our visit and your time in completing this!    Sophia Sperry L. Jaivyn Gulla, MSN, APRN, AGNP-C Adult-Gerontology Nurse Practitioner [facility]

## 2024-06-30 DIAGNOSIS — R6881 Early satiety: Secondary | ICD-10-CM | POA: Insufficient documentation

## 2024-06-30 DIAGNOSIS — R634 Abnormal weight loss: Secondary | ICD-10-CM | POA: Insufficient documentation

## 2024-07-02 ENCOUNTER — Other Ambulatory Visit: Payer: Self-pay

## 2024-07-02 ENCOUNTER — Encounter (HOSPITAL_COMMUNITY): Payer: Self-pay

## 2024-07-02 ENCOUNTER — Encounter (HOSPITAL_COMMUNITY)
Admission: RE | Admit: 2024-07-02 | Discharge: 2024-07-02 | Disposition: A | Discharge status: Home / Self Care | Source: Ambulatory Visit | Attending: Gastroenterology | Admitting: Gastroenterology

## 2024-07-02 NOTE — Telephone Encounter (Signed)
 Prior shara is not required for TCS on carelon

## 2024-07-04 ENCOUNTER — Ambulatory Visit (HOSPITAL_COMMUNITY)
Admission: RE | Admit: 2024-07-04 | Discharge: 2024-07-04 | Disposition: A | Discharge status: Home / Self Care | Attending: Gastroenterology | Admitting: Gastroenterology

## 2024-07-04 ENCOUNTER — Other Ambulatory Visit: Payer: Self-pay

## 2024-07-04 ENCOUNTER — Encounter (HOSPITAL_COMMUNITY): Admission: RE | Disposition: A | Payer: Self-pay | Source: Home / Self Care | Attending: Gastroenterology

## 2024-07-04 ENCOUNTER — Ambulatory Visit (HOSPITAL_COMMUNITY): Admitting: Anesthesiology

## 2024-07-04 ENCOUNTER — Encounter (HOSPITAL_COMMUNITY): Payer: Self-pay | Admitting: Gastroenterology

## 2024-07-04 DIAGNOSIS — K3189 Other diseases of stomach and duodenum: Secondary | ICD-10-CM | POA: Insufficient documentation

## 2024-07-04 DIAGNOSIS — R1013 Epigastric pain: Secondary | ICD-10-CM | POA: Diagnosis present

## 2024-07-04 DIAGNOSIS — J45909 Unspecified asthma, uncomplicated: Secondary | ICD-10-CM | POA: Diagnosis not present

## 2024-07-04 DIAGNOSIS — K2970 Gastritis, unspecified, without bleeding: Secondary | ICD-10-CM

## 2024-07-04 DIAGNOSIS — K449 Diaphragmatic hernia without obstruction or gangrene: Secondary | ICD-10-CM | POA: Insufficient documentation

## 2024-07-04 DIAGNOSIS — K219 Gastro-esophageal reflux disease without esophagitis: Secondary | ICD-10-CM | POA: Diagnosis not present

## 2024-07-04 DIAGNOSIS — Z6831 Body mass index (BMI) 31.0-31.9, adult: Secondary | ICD-10-CM | POA: Diagnosis not present

## 2024-07-04 DIAGNOSIS — I10 Essential (primary) hypertension: Secondary | ICD-10-CM | POA: Insufficient documentation

## 2024-07-04 DIAGNOSIS — F419 Anxiety disorder, unspecified: Secondary | ICD-10-CM | POA: Insufficient documentation

## 2024-07-04 DIAGNOSIS — R7303 Prediabetes: Secondary | ICD-10-CM | POA: Insufficient documentation

## 2024-07-04 DIAGNOSIS — F909 Attention-deficit hyperactivity disorder, unspecified type: Secondary | ICD-10-CM | POA: Diagnosis not present

## 2024-07-04 DIAGNOSIS — K5902 Outlet dysfunction constipation: Secondary | ICD-10-CM | POA: Insufficient documentation

## 2024-07-04 DIAGNOSIS — E669 Obesity, unspecified: Secondary | ICD-10-CM | POA: Diagnosis not present

## 2024-07-04 DIAGNOSIS — F17290 Nicotine dependence, other tobacco product, uncomplicated: Secondary | ICD-10-CM | POA: Insufficient documentation

## 2024-07-04 DIAGNOSIS — F319 Bipolar disorder, unspecified: Secondary | ICD-10-CM | POA: Insufficient documentation

## 2024-07-04 LAB — POCT PREGNANCY, URINE: Preg Test, Ur: NEGATIVE

## 2024-07-04 MED ORDER — PROPOFOL 10 MG/ML IV BOLUS
INTRAVENOUS | Status: DC | PRN
  Administered 2024-07-04 (×2): 20 mg via INTRAVENOUS
  Administered 2024-07-04: 60 mg via INTRAVENOUS
  Administered 2024-07-04: 120 mg via INTRAVENOUS

## 2024-07-04 MED ORDER — LACTATED RINGERS IV SOLN: INTRAVENOUS | Status: DC

## 2024-07-04 MED ORDER — LACTATED RINGERS IV SOLN: INTRAVENOUS | Status: DC | PRN

## 2024-07-04 MED ORDER — LIDOCAINE HCL 1 % IJ SOLN
INTRAMUSCULAR | Status: DC | PRN
  Administered 2024-07-04: 5 mg via INTRADERMAL

## 2024-07-04 NOTE — Interval H&P Note (Signed)
 History and Physical Interval Note:  07/04/2024 7:41 AM  Sharon Vazquez  has presented today for surgery, with the diagnosis of epigastric pain, early satiety, weight loss.  The various methods of treatment have been discussed with the patient and family. After consideration of risks, benefits and other options for treatment, the patient has consented to  Procedures with comments: EGD (ESOPHAGOGASTRODUODENOSCOPY) (N/A) - 8:15am, ASA 2 as a surgical intervention.  The patient's history has been reviewed, patient examined, no change in status, stable for surgery.  I have reviewed the patient's chart and labs.  Questions were answered to the patient's satisfaction.     Deatrice FALCON Esly Selvage

## 2024-07-04 NOTE — Anesthesia Preprocedure Evaluation (Signed)
"                                    Anesthesia Evaluation  Patient identified by MRN, date of birth, ID band Patient awake    Reviewed: Allergy & Precautions, H&P , NPO status , Patient's Chart, lab work & pertinent test results, reviewed documented beta blocker date and time   Airway Mallampati: II  TM Distance: >3 FB Neck ROM: full    Dental no notable dental hx.    Pulmonary shortness of breath, asthma , Current Smoker   Pulmonary exam normal breath sounds clear to auscultation       Cardiovascular Exercise Tolerance: Good hypertension,  Rhythm:regular Rate:Normal     Neuro/Psych  Headaches PSYCHIATRIC DISORDERS Anxiety Depression Bipolar Disorder    Neuromuscular disease    GI/Hepatic Neg liver ROS,GERD  ,,  Endo/Other  negative endocrine ROS    Renal/GU negative Renal ROS  negative genitourinary   Musculoskeletal   Abdominal   Peds  Hematology negative hematology ROS (+)   Anesthesia Other Findings   Reproductive/Obstetrics negative OB ROS                              Anesthesia Physical Anesthesia Plan  ASA: 2  Anesthesia Plan: MAC   Post-op Pain Management:    Induction:   PONV Risk Score and Plan: Propofol  infusion  Airway Management Planned:   Additional Equipment:   Intra-op Plan:   Post-operative Plan:   Informed Consent: I have reviewed the patients History and Physical, chart, labs and discussed the procedure including the risks, benefits and alternatives for the proposed anesthesia with the patient or authorized representative who has indicated his/her understanding and acceptance.     Dental Advisory Given  Plan Discussed with: CRNA  Anesthesia Plan Comments:         Anesthesia Quick Evaluation  "

## 2024-07-04 NOTE — Transfer of Care (Signed)
 Immediate Anesthesia Transfer of Care Note  Patient: Sharon Vazquez  Procedure(s) Performed: EGD (ESOPHAGOGASTRODUODENOSCOPY)  Patient Location: Short Stay  Anesthesia Type:General  Level of Consciousness: awake  Airway & Oxygen Therapy: Patient Spontanous Breathing  Post-op Assessment: Report given to RN  Post vital signs: Reviewed and stable  Last Vitals:  Vitals Value Taken Time  BP    Temp    Pulse    Resp    SpO2      Last Pain:  Vitals:   07/04/24 0747  TempSrc:   PainSc: 7       Patients Stated Pain Goal: 2 (07/04/24 0713)  Complications: No notable events documented.

## 2024-07-04 NOTE — Discharge Instructions (Signed)

## 2024-07-04 NOTE — Anesthesia Postprocedure Evaluation (Signed)
"   Anesthesia Post Note  Patient: Sharon Vazquez  Procedure(s) Performed: EGD (ESOPHAGOGASTRODUODENOSCOPY)  Patient location during evaluation: Short Stay Anesthesia Type: MAC Level of consciousness: awake and alert Pain management: pain level controlled Vital Signs Assessment: post-procedure vital signs reviewed and stable Respiratory status: spontaneous breathing Cardiovascular status: blood pressure returned to baseline Postop Assessment: no apparent nausea or vomiting Anesthetic complications: no   No notable events documented.   Last Vitals:  Vitals:   07/04/24 0807 07/04/24 0816  BP: (!) 147/100 (!) 149/101  Pulse: 82   Resp: 18   Temp: 36.7 C   SpO2: 100%     Last Pain:  Vitals:   07/04/24 0807  TempSrc: Oral  PainSc: 7                  Maghan Jessee      "

## 2024-07-04 NOTE — Op Note (Signed)
 [facility] Patient Name: Sharon Vazquez Procedure Date: 07/04/2024 7:04 AM MRN: [id number] Date of Birth: [birth year] Attending MD: Deatrice Dine , MD, [id number] CSN: [id number] Age: 44 Admit Type: Outpatient Procedure:                Upper GI endoscopy Indications:              Epigastric abdominal pain, Dyspepsia Providers:                Deatrice Dine, MD, Rosina Sprague, Daphne Mulch                            Technician, Technician Referring MD:              Medicines:                Monitored Anesthesia Care Complications:            No immediate complications. Estimated Blood Loss:     Estimated blood loss was minimal. Procedure:                Pre-Anesthesia Assessment:                           - Prior to the procedure, a History and Physical                            was performed, and patient medications and                            allergies were reviewed. The patient's tolerance of                            previous anesthesia was also reviewed. The risks                            and benefits of the procedure and the sedation                            options and risks were discussed with the patient.                            All questions were answered, and informed consent                            was obtained. Prior Anticoagulants: The patient has                            taken no anticoagulant or antiplatelet agents. ASA                            Grade Assessment: III - A patient with severe                            systemic disease. After reviewing the risks and  benefits, the patient was deemed in satisfactory                            condition to undergo the procedure.                           After obtaining informed consent, the endoscope was                            passed under direct vision. Throughout the                            procedure, the patient's blood pressure, pulse, and                             oxygen saturations were monitored continuously. The                            [id number] ([id number]) Upper was introduced through                            the mouth, and advanced to the second part of                            duodenum. The upper GI endoscopy was accomplished                            without difficulty. The patient tolerated the                            procedure well. Scope In: 7:55:22 AM Scope Out: 7:59:54 AM Total Procedure Duration: 0 hours 4 minutes 32 seconds  Findings:      The examined esophagus was normal.      The gastroesophageal flap valve was visualized endoscopically and       classified as Hill Grade III (minimal fold, loose to endoscope, hiatal       hernia likely).      Mildly erythematous mucosa without bleeding was found in the gastric       body. This was biopsied with a cold forceps for histology.      The duodenal bulb and second portion of the duodenum were normal.       Biopsies were taken with a cold forceps for histology. Impression:               - Normal esophagus.                           - Gastroesophageal flap valve classified as Hill                            Grade III (minimal fold, loose to endoscope, hiatal                            hernia likely).                           -  Erythematous mucosa in the gastric body. Biopsied.                           - Normal duodenal bulb and second portion of the                            duodenum. Biopsied. Moderate Sedation:      Per Anesthesia Care Recommendation:           - Patient has a contact number available for                            emergencies. The signs and symptoms of potential                            delayed complications were discussed with the                            patient. Return to normal activities tomorrow.                            Written discharge instructions were provided to the                            patient.                           -  Resume previous diet.                           - Continue present medications.                           - Await pathology results.                           -If continued symptoms obtain CT abdomen and Pelvis Procedure Code(s):        --- Professional ---                           [phone], Esophagogastroduodenoscopy, flexible,                            transoral; with biopsy, single or multiple Diagnosis Code(s):        --- Professional ---                           K31.89, Other diseases of stomach and duodenum                           R10.13, Epigastric pain CPT copyright 2022 [employer]. All rights reserved. The codes documented in this report are preliminary and upon coder review may  be revised to meet current compliance requirements. Deatrice Dine, MD Deatrice Dine, MD 07/04/2024 8:06:35 AM This report has been signed electronically. Number of Addenda: 0

## 2024-07-05 LAB — SURGICAL PATHOLOGY

## 2024-07-06 ENCOUNTER — Encounter (HOSPITAL_COMMUNITY): Payer: Self-pay | Admitting: Gastroenterology

## 2024-07-08 ENCOUNTER — Other Ambulatory Visit: Payer: Self-pay | Admitting: Anesthesiology

## 2024-07-08 ENCOUNTER — Ambulatory Visit (INDEPENDENT_AMBULATORY_CARE_PROVIDER_SITE_OTHER): Payer: Self-pay | Admitting: Gastroenterology

## 2024-07-08 DIAGNOSIS — M4327 Fusion of spine, lumbosacral region: Secondary | ICD-10-CM

## 2024-07-08 DIAGNOSIS — M5136 Other intervertebral disc degeneration, lumbar region with discogenic back pain only: Secondary | ICD-10-CM

## 2024-07-10 ENCOUNTER — Telehealth (INDEPENDENT_AMBULATORY_CARE_PROVIDER_SITE_OTHER): Payer: Self-pay | Admitting: Gastroenterology

## 2024-07-10 NOTE — Progress Notes (Signed)
 Patient result letter mailed procedure note and pathology result faxed to PCP

## 2024-07-10 NOTE — Telephone Encounter (Signed)
 PA received from [facility]. PA completed for [employer]  via cover my meds. Per Cover My Meds medication is available without authorization.

## 2024-07-10 NOTE — Telephone Encounter (Signed)
 Contacted [facility] and made them aware that [employer]  was available without PA. [place] states pt secondary insurance is asking for [employer]. PA completed via cover my meds for [employer]

## 2024-07-10 NOTE — Telephone Encounter (Signed)
 [employer] has denied [employer] . They do not cover the requested med under the members benefit plan.  [employer]  goes through primary insurance with no PA needed

## 2024-07-23 ENCOUNTER — Other Ambulatory Visit
